# Patient Record
Sex: Female | Born: 1955 | Race: Black or African American | Hispanic: No | Marital: Single | Smoking: Never smoker
Health system: Southern US, Community
[De-identification: ages and names within clinical notes are randomized; demographics above are authoritative.]

## PROBLEM LIST (undated history)

## (undated) DIAGNOSIS — I2699 Other pulmonary embolism without acute cor pulmonale: Secondary | ICD-10-CM

## (undated) DIAGNOSIS — E785 Hyperlipidemia, unspecified: Secondary | ICD-10-CM

## (undated) DIAGNOSIS — M1990 Unspecified osteoarthritis, unspecified site: Secondary | ICD-10-CM

## (undated) DIAGNOSIS — G4730 Sleep apnea, unspecified: Secondary | ICD-10-CM

## (undated) DIAGNOSIS — I5189 Other ill-defined heart diseases: Secondary | ICD-10-CM

## (undated) DIAGNOSIS — I10 Essential (primary) hypertension: Secondary | ICD-10-CM

## (undated) DIAGNOSIS — K219 Gastro-esophageal reflux disease without esophagitis: Secondary | ICD-10-CM

## (undated) DIAGNOSIS — E119 Type 2 diabetes mellitus without complications: Secondary | ICD-10-CM

## (undated) HISTORY — DX: Other pulmonary embolism without acute cor pulmonale: I26.99

## (undated) HISTORY — DX: Sleep apnea, unspecified: G47.30

## (undated) HISTORY — PX: FOOT SURGERY: SHX648

## (undated) HISTORY — PX: BREAST EXCISIONAL BIOPSY: SUR124

---

## 1994-04-13 HISTORY — PX: CHOLECYSTECTOMY: SHX55

## 1997-04-13 DIAGNOSIS — I2699 Other pulmonary embolism without acute cor pulmonale: Secondary | ICD-10-CM

## 1997-04-13 HISTORY — DX: Other pulmonary embolism without acute cor pulmonale: I26.99

## 1997-04-13 HISTORY — PX: ABDOMINAL HYSTERECTOMY: SHX81

## 2001-06-30 ENCOUNTER — Emergency Department (HOSPITAL_COMMUNITY): Admission: EM | Admit: 2001-06-30 | Discharge: 2001-06-30 | Payer: Self-pay | Admitting: Emergency Medicine

## 2003-07-11 ENCOUNTER — Emergency Department (HOSPITAL_COMMUNITY): Admission: EM | Admit: 2003-07-11 | Discharge: 2003-07-12 | Payer: Self-pay | Admitting: *Deleted

## 2006-11-24 ENCOUNTER — Emergency Department (HOSPITAL_COMMUNITY): Admission: EM | Admit: 2006-11-24 | Discharge: 2006-11-24 | Payer: Self-pay | Admitting: Emergency Medicine

## 2008-01-31 ENCOUNTER — Emergency Department (HOSPITAL_COMMUNITY): Admission: EM | Admit: 2008-01-31 | Discharge: 2008-01-31 | Payer: Self-pay | Admitting: Emergency Medicine

## 2009-10-11 DIAGNOSIS — I5189 Other ill-defined heart diseases: Secondary | ICD-10-CM

## 2009-10-11 HISTORY — DX: Other ill-defined heart diseases: I51.89

## 2009-11-07 ENCOUNTER — Inpatient Hospital Stay (HOSPITAL_COMMUNITY): Admission: EM | Admit: 2009-11-07 | Discharge: 2009-11-08 | Payer: Self-pay | Admitting: Emergency Medicine

## 2009-11-07 ENCOUNTER — Encounter (INDEPENDENT_AMBULATORY_CARE_PROVIDER_SITE_OTHER): Payer: Self-pay | Admitting: Internal Medicine

## 2009-11-07 ENCOUNTER — Ambulatory Visit: Payer: Self-pay | Admitting: Cardiology

## 2009-11-13 ENCOUNTER — Ambulatory Visit: Payer: Self-pay | Admitting: Physician Assistant

## 2009-11-13 DIAGNOSIS — E782 Mixed hyperlipidemia: Secondary | ICD-10-CM

## 2009-11-13 DIAGNOSIS — M76899 Other specified enthesopathies of unspecified lower limb, excluding foot: Secondary | ICD-10-CM

## 2009-11-13 DIAGNOSIS — K219 Gastro-esophageal reflux disease without esophagitis: Secondary | ICD-10-CM

## 2009-11-13 DIAGNOSIS — M545 Low back pain, unspecified: Secondary | ICD-10-CM | POA: Insufficient documentation

## 2009-11-15 ENCOUNTER — Telehealth: Payer: Self-pay | Admitting: Family Medicine

## 2010-05-04 ENCOUNTER — Encounter: Payer: Self-pay | Admitting: Family Medicine

## 2010-05-13 NOTE — Progress Notes (Signed)
Summary: leg swollen  Phone Note Call from Patient   Summary of Call: patient left msg on machine stating that she was her on Wednesday and saw Dawn, but her leg is swollen more, and she has had a blood clott in the past and she is scared that it might be that again.  Please cal her at [phone].  Initial call taken by: Curtis Sites,  November 15, 2009 11:19 AM  Follow-up for Phone Call        NEEDS ED EVAL  Follow-up by: Syliva Overman MD,  November 15, 2009 11:56 AM  Additional Follow-up for Phone Call Additional follow up Details #1::        called and left message with family member to get her to call me back Additional Follow-up by: Everitt Amber LPN,  November 15, 2009 11:58 AM    Additional Follow-up for Phone Call Additional follow up Details #2::    called back and spoke to the same family member and they told me again that they would have her call back.
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
## 2010-06-28 LAB — RAPID URINE DRUG SCREEN, HOSP PERFORMED
Amphetamines: NOT DETECTED
Benzodiazepines: NOT DETECTED
Cocaine: NOT DETECTED

## 2010-06-28 LAB — HEMOGLOBIN A1C
Hgb A1c MFr Bld: 6.1 % — ABNORMAL HIGH (ref <5.7)
Mean Plasma Glucose: 128 mg/dL — ABNORMAL HIGH (ref <117)

## 2010-06-28 LAB — CARDIAC PANEL(CRET KIN+CKTOT+MB+TROPI)
CK, MB: 1.4 ng/mL (ref 0.3–4.0)
CK, MB: 1.4 ng/mL (ref 0.3–4.0)
Relative Index: 0.9 (ref 0.0–2.5)
Relative Index: 0.9 (ref 0.0–2.5)
Total CK: 158 U/L (ref 7–177)
Total CK: 164 U/L (ref 7–177)
Troponin I: 0.01 ng/mL (ref 0.00–0.06)
Troponin I: <0.01 ng/mL (ref 0.00–0.06)

## 2010-06-28 LAB — DIFFERENTIAL
Lymphocytes Relative: 34 % (ref 12–46)
Lymphs Abs: 3.2 10*3/uL (ref 0.7–4.0)
Lymphs Abs: 3.7 10*3/uL (ref 0.7–4.0)
Monocytes Relative: 5 % (ref 3–12)
Monocytes Relative: 5 % (ref 3–12)
Neutro Abs: 5.3 10*3/uL (ref 1.7–7.7)
Neutro Abs: 6.5 10*3/uL (ref 1.7–7.7)
Neutrophils Relative %: 58 % (ref 43–77)
Neutrophils Relative %: 60 % (ref 43–77)

## 2010-06-28 LAB — CBC
HCT: 40.6 % (ref 36.0–46.0)
Hemoglobin: 13.2 g/dL (ref 12.0–15.0)
MCH: 23.3 pg — ABNORMAL LOW (ref 26.0–34.0)
MCHC: 31.7 g/dL (ref 30.0–36.0)
MCV: 73.5 fL — ABNORMAL LOW (ref 78.0–100.0)
Platelets: 257 10*3/uL (ref 150–400)
RBC: 5.68 MIL/uL — ABNORMAL HIGH (ref 3.87–5.11)
RDW: 17.6 % — ABNORMAL HIGH (ref 11.5–15.5)
WBC: 10.9 10*3/uL — ABNORMAL HIGH (ref 4.0–10.5)
WBC: 9.1 10*3/uL (ref 4.0–10.5)

## 2010-06-28 LAB — BASIC METABOLIC PANEL
CO2: 27 mEq/L (ref 19–32)
Calcium: 8.9 mg/dL (ref 8.4–10.5)
Creatinine, Ser: 0.74 mg/dL (ref 0.4–1.2)
GFR calc Af Amer: >60 mL/min (ref >60)
GFR calc non Af Amer: >60 mL/min (ref >60)
Sodium: 137 mEq/L (ref 135–145)

## 2010-06-28 LAB — COMPREHENSIVE METABOLIC PANEL
Alkaline Phosphatase: 78 U/L (ref 39–117)
BUN: 9 mg/dL (ref 6–23)
Creatinine, Ser: 0.78 mg/dL (ref 0.4–1.2)
Glucose, Bld: 107 mg/dL — ABNORMAL HIGH (ref 70–99)
Potassium: 3.9 mEq/L (ref 3.5–5.1)
Total Protein: 7.7 g/dL (ref 6.0–8.3)

## 2010-06-28 LAB — LIPID PANEL
Cholesterol: 206 mg/dL — ABNORMAL HIGH (ref 0–200)
LDL Cholesterol: 145 mg/dL — ABNORMAL HIGH (ref 0–99)
Total CHOL/HDL Ratio: 5.2 RATIO
Triglycerides: 105 mg/dL (ref <150)
VLDL: 21 mg/dL (ref 0–40)

## 2010-06-28 LAB — POCT CARDIAC MARKERS
Myoglobin, poc: 79.2 ng/mL (ref 12–200)
Troponin i, poc: <0.05 ng/mL (ref 0.00–0.09)
Troponin i, poc: <0.05 ng/mL (ref 0.00–0.09)

## 2010-06-28 LAB — URINALYSIS, ROUTINE W REFLEX MICROSCOPIC
Bilirubin Urine: NEGATIVE
Glucose, UA: NEGATIVE mg/dL
Hgb urine dipstick: NEGATIVE
Specific Gravity, Urine: >1.03 — ABNORMAL HIGH (ref 1.005–1.030)

## 2010-06-28 LAB — D-DIMER, QUANTITATIVE: D-Dimer, Quant: 0.34 ug/mL-FEU (ref 0.00–0.48)

## 2010-06-28 LAB — TSH: TSH: 1.525 u[IU]/mL (ref 0.350–4.500)

## 2010-06-28 LAB — HEPATIC FUNCTION PANEL
ALT: 24 U/L (ref 0–35)
AST: 23 U/L (ref 0–37)
Albumin: 3.6 g/dL (ref 3.5–5.2)

## 2010-06-28 LAB — APTT: aPTT: 36 seconds (ref 24–37)

## 2010-12-06 ENCOUNTER — Emergency Department (HOSPITAL_COMMUNITY): Payer: Self-pay

## 2010-12-06 ENCOUNTER — Encounter: Payer: Self-pay | Admitting: Emergency Medicine

## 2010-12-06 ENCOUNTER — Emergency Department (HOSPITAL_COMMUNITY)
Admission: EM | Admit: 2010-12-06 | Discharge: 2010-12-06 | Disposition: A | Discharge status: Home / Self Care | Payer: Self-pay | Attending: Emergency Medicine | Admitting: Emergency Medicine

## 2010-12-06 DIAGNOSIS — M79609 Pain in unspecified limb: Secondary | ICD-10-CM | POA: Insufficient documentation

## 2010-12-06 DIAGNOSIS — Z86718 Personal history of other venous thrombosis and embolism: Secondary | ICD-10-CM | POA: Insufficient documentation

## 2010-12-06 DIAGNOSIS — M791 Myalgia, unspecified site: Secondary | ICD-10-CM

## 2010-12-06 DIAGNOSIS — IMO0001 Reserved for inherently not codable concepts without codable children: Secondary | ICD-10-CM | POA: Insufficient documentation

## 2010-12-06 DIAGNOSIS — R0602 Shortness of breath: Secondary | ICD-10-CM | POA: Insufficient documentation

## 2010-12-06 LAB — BASIC METABOLIC PANEL
CO2: 26 mEq/L (ref 19–32)
Calcium: 8.9 mg/dL (ref 8.4–10.5)
Creatinine, Ser: 0.67 mg/dL (ref 0.50–1.10)
Glucose, Bld: 124 mg/dL — ABNORMAL HIGH (ref 70–99)

## 2010-12-06 LAB — CBC
HCT: 38.5 % (ref 36.0–46.0)
Hemoglobin: 12.4 g/dL (ref 12.0–15.0)
MCH: 22.8 pg — ABNORMAL LOW (ref 26.0–34.0)
MCV: 70.6 fL — ABNORMAL LOW (ref 78.0–100.0)
RBC: 5.45 MIL/uL — ABNORMAL HIGH (ref 3.87–5.11)

## 2010-12-06 LAB — CK: Total CK: 305 U/L — ABNORMAL HIGH (ref 7–177)

## 2010-12-06 MED ORDER — ONDANSETRON HCL 4 MG/2ML IJ SOLN
4.0000 mg | Freq: Once | INTRAMUSCULAR | Status: AC
  Administered 2010-12-06: 4 mg via INTRAVENOUS
  Filled 2010-12-06: qty 2

## 2010-12-06 MED ORDER — MORPHINE SULFATE 4 MG/ML IJ SOLN
4.0000 mg | Freq: Once | INTRAMUSCULAR | Status: AC
  Administered 2010-12-06: 4 mg via INTRAVENOUS
  Filled 2010-12-06: qty 1

## 2010-12-06 MED ORDER — OXYCODONE-ACETAMINOPHEN 5-325 MG PO TABS: 1.0000 | ORAL_TABLET | ORAL | Status: AC | PRN

## 2010-12-06 MED ORDER — HYDROMORPHONE HCL 1 MG/ML IJ SOLN
1.0000 mg | Freq: Once | INTRAMUSCULAR | Status: AC
  Administered 2010-12-06: 1 mg via INTRAVENOUS
  Filled 2010-12-06: qty 1

## 2010-12-06 NOTE — ED Provider Notes (Signed)
History     CSN: [id number] Arrival date & time: 12/06/2010  1:20 AM  Chief Complaint  Patient presents with  . Shortness of Breath   HPI Comments: Pt reports left thigh and left calf pain, no injury, she has had this pain on/off for awhile.  No known cause.  No new back pain, does not radiate from back.  No leg weakness Reports distant h/o PE, but no recent DVT. No new edema/redness to left LE.  She had mentioned SOB to nurse, but admits to me this is chronic and not new and not changed.  Patient is a 55 y.o. female presenting with leg pain.  Leg Pain  There was no injury mechanism. The pain is present in the left thigh (left calf). The quality of the pain is described as aching. The pain is moderate. The pain has been constant since onset. Associated symptoms include inability to bear weight. Pertinent negatives include no numbness, no loss of motion, no muscle weakness, no loss of sensation and no tingling. The symptoms are aggravated by activity and bearing weight.    History reviewed. No pertinent past medical history.  History reviewed. No pertinent past surgical history.  No family history on file.  History  Substance Use Topics  . Smoking status: Never Smoker   . Smokeless tobacco: Not on file  . Alcohol Use: No    OB History    Grav Para Term Preterm Abortions TAB SAB Ect Mult Living                  Review of Systems  Neurological: Negative for tingling and numbness.  All other systems reviewed and are negative.    Physical Exam  BP 117/68  Pulse 91  Temp(Src) 98 F (36.7 C) (Oral)  Resp 24  Ht 5\' 9"  (1.753 m)  Wt 360 lb (163.295 kg)  BMI 53.16 kg/m2  SpO2 97%  Physical Exam  CONSTITUTIONAL: Well developed/well nourished HEAD AND FACE: Normocephalic/atraumatic EYES: EOMI/PERRL ENMT: Mucous membranes moist NECK: supple no meningeal signs SPINE:entire spine nontender CV: S1/S2 noted, no murmurs/rubs/gallops noted LUNGS: Lungs are clear to  auscultation bilaterally, no apparent distress ABDOMEN: soft, nontender, no rebound or guarding, obese NEURO: Pt is awake/alert, moves all extremitiesx4, no focal motor deficits noted in the LE EXTREMITIES: pulses normal, full ROM, but tender to left thigh and left calf, no erythema noted, no crepitance noted, symmetric nonpitting edema in each LE.   SKIN: warm, color normal PSYCH: no abnormalities of mood noted   ED Course  Procedures  MDM Nursing notes reviewed and considered in documentation All labs/vitals reviewed and considered Previous records reviewed and considered xrays reviewed and considered  Pt low risk for DVT, and negative D-dimer noted   4:31 AM Pt reports continued pain.  No known injury but she admits she will get this pain on/off for awhile.  Again she denies any new CP/SOB. Will get imaging as she now localizes pain to left thigh/left tibial surface She can range left hip/knee/ankle.  Denies new back pain or pain radiating into her LE.  No signs of cellulitis.    Pt much improved at time of d/c.  She is amenable to taking a day off work, rest at home, and walker if needed.  She admits to having this pain on/off in the past, suspicion for acute infectious/neurovascular process is low  Joya Gaskins, MD 12/06/10 [phone]

## 2010-12-06 NOTE — ED Notes (Signed)
Patient c/o shortness of breath x 2 weeks; states has gotten worse.

## 2011-01-12 LAB — CBC
HCT: 44
Hemoglobin: 13.9
MCHC: 31.5
RDW: 16.6 — ABNORMAL HIGH

## 2011-01-12 LAB — COMPREHENSIVE METABOLIC PANEL
Alkaline Phosphatase: 72
BUN: 9
GFR calc non Af Amer: >60
Glucose, Bld: 93
Potassium: 3.7
Total Bilirubin: 1
Total Protein: 7.9

## 2011-01-12 LAB — DIFFERENTIAL
Basophils Absolute: 0.1
Basophils Relative: 1
Monocytes Relative: 5
Neutro Abs: 5.5
Neutrophils Relative %: 61

## 2011-01-12 LAB — POCT CARDIAC MARKERS
CKMB, poc: 2.1
Myoglobin, poc: 159

## 2011-01-12 LAB — D-DIMER, QUANTITATIVE: D-Dimer, Quant: 0.4

## 2012-04-14 ENCOUNTER — Encounter (HOSPITAL_COMMUNITY): Payer: Self-pay | Admitting: *Deleted

## 2012-04-14 ENCOUNTER — Emergency Department (HOSPITAL_COMMUNITY): Payer: BC Managed Care – PPO

## 2012-04-14 ENCOUNTER — Observation Stay (HOSPITAL_COMMUNITY): Payer: BC Managed Care – PPO

## 2012-04-14 ENCOUNTER — Observation Stay (HOSPITAL_COMMUNITY)
Admission: EM | Admit: 2012-04-14 | Discharge: 2012-04-15 | Disposition: A | Discharge status: Home / Self Care | Payer: BC Managed Care – PPO | Attending: Internal Medicine | Admitting: Internal Medicine

## 2012-04-14 DIAGNOSIS — E782 Mixed hyperlipidemia: Secondary | ICD-10-CM | POA: Diagnosis present

## 2012-04-14 DIAGNOSIS — R55 Syncope and collapse: Secondary | ICD-10-CM

## 2012-04-14 DIAGNOSIS — R030 Elevated blood-pressure reading, without diagnosis of hypertension: Secondary | ICD-10-CM

## 2012-04-14 DIAGNOSIS — I10 Essential (primary) hypertension: Secondary | ICD-10-CM | POA: Insufficient documentation

## 2012-04-14 DIAGNOSIS — R7303 Prediabetes: Secondary | ICD-10-CM

## 2012-04-14 DIAGNOSIS — R0602 Shortness of breath: Secondary | ICD-10-CM | POA: Insufficient documentation

## 2012-04-14 DIAGNOSIS — I5189 Other ill-defined heart diseases: Secondary | ICD-10-CM

## 2012-04-14 DIAGNOSIS — R079 Chest pain, unspecified: Secondary | ICD-10-CM | POA: Insufficient documentation

## 2012-04-14 DIAGNOSIS — R739 Hyperglycemia, unspecified: Secondary | ICD-10-CM | POA: Diagnosis present

## 2012-04-14 DIAGNOSIS — E1169 Type 2 diabetes mellitus with other specified complication: Secondary | ICD-10-CM | POA: Diagnosis present

## 2012-04-14 DIAGNOSIS — M545 Low back pain: Secondary | ICD-10-CM

## 2012-04-14 DIAGNOSIS — K219 Gastro-esophageal reflux disease without esophagitis: Secondary | ICD-10-CM | POA: Insufficient documentation

## 2012-04-14 DIAGNOSIS — R000 Tachycardia, unspecified: Secondary | ICD-10-CM | POA: Diagnosis present

## 2012-04-14 DIAGNOSIS — E785 Hyperlipidemia, unspecified: Secondary | ICD-10-CM | POA: Insufficient documentation

## 2012-04-14 DIAGNOSIS — R7309 Other abnormal glucose: Secondary | ICD-10-CM | POA: Insufficient documentation

## 2012-04-14 DIAGNOSIS — M76899 Other specified enthesopathies of unspecified lower limb, excluding foot: Secondary | ICD-10-CM

## 2012-04-14 HISTORY — DX: Unspecified osteoarthritis, unspecified site: M19.90

## 2012-04-14 HISTORY — DX: Gastro-esophageal reflux disease without esophagitis: K21.9

## 2012-04-14 HISTORY — DX: Other pulmonary embolism without acute cor pulmonale: I26.99

## 2012-04-14 HISTORY — DX: Hyperlipidemia, unspecified: E78.5

## 2012-04-14 HISTORY — DX: Morbid (severe) obesity due to excess calories: E66.01

## 2012-04-14 HISTORY — DX: Essential (primary) hypertension: I10

## 2012-04-14 HISTORY — DX: Other ill-defined heart diseases: I51.89

## 2012-04-14 LAB — BASIC METABOLIC PANEL
BUN: 14 mg/dL (ref 6–23)
CO2: 23 mEq/L (ref 19–32)
Calcium: 9 mg/dL (ref 8.4–10.5)
Chloride: 101 mEq/L (ref 96–112)
Creatinine, Ser: 0.85 mg/dL (ref 0.50–1.10)
GFR calc Af Amer: 88 mL/min — ABNORMAL LOW (ref >90)
GFR calc non Af Amer: 76 mL/min — ABNORMAL LOW (ref >90)
Glucose, Bld: 145 mg/dL — ABNORMAL HIGH (ref 70–99)
Potassium: 3.6 mEq/L (ref 3.5–5.1)
Sodium: 138 mEq/L (ref 135–145)

## 2012-04-14 LAB — HEMOGLOBIN A1C
Hgb A1c MFr Bld: 6.4 % — ABNORMAL HIGH (ref <5.7)
Mean Plasma Glucose: 137 mg/dL — ABNORMAL HIGH (ref <117)

## 2012-04-14 LAB — CBC
HCT: 40.7 % (ref 36.0–46.0)
Hemoglobin: 13.1 g/dL (ref 12.0–15.0)
MCH: 22.9 pg — ABNORMAL LOW (ref 26.0–34.0)
MCHC: 32.2 g/dL (ref 30.0–36.0)
MCV: 71 fL — ABNORMAL LOW (ref 78.0–100.0)
Platelets: 271 10*3/uL (ref 150–400)
RBC: 5.73 MIL/uL — ABNORMAL HIGH (ref 3.87–5.11)
RDW: 16 % — ABNORMAL HIGH (ref 11.5–15.5)
WBC: 10.7 10*3/uL — ABNORMAL HIGH (ref 4.0–10.5)

## 2012-04-14 LAB — HEPATIC FUNCTION PANEL
ALT: 20 U/L (ref 0–35)
AST: 19 U/L (ref 0–37)
Total Protein: 8.1 g/dL (ref 6.0–8.3)

## 2012-04-14 LAB — TSH: TSH: 0.519 u[IU]/mL (ref 0.350–4.500)

## 2012-04-14 LAB — TROPONIN I: Troponin I: <0.3 ng/mL (ref <0.30)

## 2012-04-14 LAB — LIPASE, BLOOD: Lipase: 32 U/L (ref 11–59)

## 2012-04-14 MED ORDER — ACETAMINOPHEN 650 MG RE SUPP: 650.0000 mg | Freq: Four times a day (QID) | RECTAL | Status: DC | PRN

## 2012-04-14 MED ORDER — DOCUSATE SODIUM 100 MG PO CAPS
100.0000 mg | ORAL_CAPSULE | Freq: Two times a day (BID) | ORAL | Status: DC
  Administered 2012-04-14 – 2012-04-15 (×2): 100 mg via ORAL
  Filled 2012-04-14 (×3): qty 1

## 2012-04-14 MED ORDER — IOHEXOL 350 MG/ML SOLN
120.0000 mL | Freq: Once | INTRAVENOUS | Status: AC | PRN
  Administered 2012-04-14: 120 mL via INTRAVENOUS

## 2012-04-14 MED ORDER — ONDANSETRON HCL 4 MG/2ML IJ SOLN: 4.0000 mg | Freq: Four times a day (QID) | INTRAMUSCULAR | Status: DC | PRN

## 2012-04-14 MED ORDER — ALBUTEROL SULFATE (5 MG/ML) 0.5% IN NEBU: 2.5000 mg | INHALATION_SOLUTION | RESPIRATORY_TRACT | Status: DC | PRN

## 2012-04-14 MED ORDER — ASPIRIN EC 81 MG PO TBEC
81.0000 mg | DELAYED_RELEASE_TABLET | Freq: Every day | ORAL | Status: DC
  Administered 2012-04-15: 81 mg via ORAL
  Filled 2012-04-14: qty 1

## 2012-04-14 MED ORDER — OXYCODONE HCL 5 MG PO TABS: 5.0000 mg | ORAL_TABLET | ORAL | Status: DC | PRN

## 2012-04-14 MED ORDER — POTASSIUM CHLORIDE IN NACL 20-0.9 MEQ/L-% IV SOLN
INTRAVENOUS | Status: DC
  Administered 2012-04-14: 18:00:00 via INTRAVENOUS

## 2012-04-14 MED ORDER — ALUM & MAG HYDROXIDE-SIMETH 200-200-20 MG/5ML PO SUSP: 30.0000 mL | Freq: Four times a day (QID) | ORAL | Status: DC | PRN

## 2012-04-14 MED ORDER — METOPROLOL TARTRATE 25 MG PO TABS
12.5000 mg | ORAL_TABLET | Freq: Two times a day (BID) | ORAL | Status: DC
  Administered 2012-04-14: 12.5 mg via ORAL
  Filled 2012-04-14: qty 1

## 2012-04-14 MED ORDER — NITROGLYCERIN 0.4 MG SL SUBL: 0.4000 mg | SUBLINGUAL_TABLET | SUBLINGUAL | Status: DC | PRN

## 2012-04-14 MED ORDER — MORPHINE SULFATE 2 MG/ML IJ SOLN: 2.0000 mg | INTRAMUSCULAR | Status: DC | PRN

## 2012-04-14 MED ORDER — ONDANSETRON HCL 4 MG PO TABS: 4.0000 mg | ORAL_TABLET | Freq: Four times a day (QID) | ORAL | Status: DC | PRN

## 2012-04-14 MED ORDER — ASPIRIN 81 MG PO CHEW
324.0000 mg | CHEWABLE_TABLET | Freq: Once | ORAL | Status: AC
  Administered 2012-04-14: 324 mg via ORAL
  Filled 2012-04-14: qty 4

## 2012-04-14 MED ORDER — ACETAMINOPHEN 325 MG PO TABS: 650.0000 mg | ORAL_TABLET | Freq: Four times a day (QID) | ORAL | Status: DC | PRN

## 2012-04-14 MED ORDER — PANTOPRAZOLE SODIUM 40 MG PO TBEC
40.0000 mg | DELAYED_RELEASE_TABLET | Freq: Every day | ORAL | Status: DC
  Administered 2012-04-15: 40 mg via ORAL
  Filled 2012-04-14: qty 1

## 2012-04-14 MED ORDER — ENOXAPARIN SODIUM 40 MG/0.4ML ~~LOC~~ SOLN: 40.0000 mg | SUBCUTANEOUS | Status: DC

## 2012-04-14 NOTE — H&P (Signed)
[facility] History and Physical  LASHUNDA GREIS ZOX:[id number] DOB: 06/12/1955 DOA: 04/14/2012  Referring physician: ED physician, Dr. Juleen China PCP: No primary provider on file.  Specialists: None  Chief Complaint: Chest pain and loss of consciousness.  HPI: Tracey [name] is a 57 y.o. female history significant for a pulmonary embolism, diastolic dysfunction, and morbid obesity, who presents to the emergency department today with a chief complaint of chest pain and a loss of consciousness. The patient has had chest pain on and off for several days. It is located primarily in the substernal area. At its worse, it is a 10 over 10 in intensity. The pain worsens with activity and then is relieved with rest. There is some radiation to the back, but no radiation to the jaw or left arm. At times, she has associated diaphoresis. She has shortness of breath with activity. Shortness of breath occurs with chest pain and without chest pain, but primarily with activity. She denies associated nausea or vomiting. This morning, she got up in her usual state of health. [place] she had to walk up a flight of steps. On her way up the steps, she developed chest pain, shortness of breath, and dizziness. When she got to the top, she felt lightheaded, dizzy, and more short of breath. She leaned over and then the next thing she knew she was on the floor. Individuals were around her as she apparently lost consciousness. When she came to, she was aware of her surroundings. She fell on her right side, but she denies right-sided pain. There was no head trauma. She has had intermittent dizziness when standing over the past week or two. She denies upper respiratory infection symptoms, fever, chills, and diarrhea.  In the emergency department, she is hypertensive with a blood pressure 169/105. Otherwise, she is afebrile and is oxygenating in the upper 90s on room air. CT angiogram of her chest reveals no evidence  of pulmonary embolism. CT of her head reveals no acute intracranial findings. Her EKG reveals sinus tachycardia with a heart rate of 113 beats per minute. Her lab data are significant for a normal troponin I., glucose of 145, and WBC of 10.7. She is being admitted for further evaluation and management.   Review of Systems: Her review of systems is positive as above in history present illness, and for chronic low back pain, chronic pain in both legs, occasional numbness in both legs, dizziness when she stands. Otherwise review of systems is negative.   Past Medical History  Diagnosis Date  . PE (pulmonary embolism)   . Diastolic dysfunction 10/2009    Grade 1  . Morbid obesity   . Hyperlipidemia   . Pulmonary embolism   . GERD (gastroesophageal reflux disease)   . Hyperlipidemia    Past Surgical History  Procedure Date  . Cesarean section   . Abdominal hysterectomy    Social History: the patient is divorced. She lives in [place]. She has 3 children. She is employed part-time. She denies tobacco, alcohol, and illicit drug use.    Allergies  Allergen Reactions  . Haloperidol Lactate Anaphylaxis   Family history: The patient is not sure what her mother died of, but she had a history of diabetes. Her father died of cirrhosis of the liver.   Prior to Admission medications   Not on File  Medications: She takes ibuprofen 3-4 tablets daily (20 mg).   Physical Exam: Filed Vitals:   04/14/12 1159 04/14/12 1230 04/14/12 1234 04/14/12  1237  BP: 124/77 120/79 139/93 169/105  Pulse: 94 99 103 117  Temp:      TempSrc:      Resp: 18     Height:      Weight:      SpO2: 96%        General:  Alert morbidly obese 57 year old African-American woman sitting up in bed, in no acute distress.   Eyes: Pupils are equal, round, and reactive to light. Extraocular was are intact. Conjunctivae are clear. Sclerae are white.   ENT: Oropharynx reveals mildly dry mucous membranes. No exudates or  erythema.   Neck: Supple, no adenopathy, no thyromegaly, no bruit.   Cardiovascular: Distant S1, S2, with a soft systolic murmur.   Respiratory: Clear to auscultation bilaterally. Breathing is nonlabored.   Abdomen: Obese, positive bowel sounds, soft, nontender, nondistended.   Skin: Fair turgor.   Musculoskeletal: No acute hot red joints. Pedal pulses palpable. Trace pedal edema.   Psychiatric: Flat/sad affect. She is alert and oriented x3. Her speech is clear.   Neurologic: Cranial nerves II through XII are intact. Strength in the supine position is globally 5 minus over 5 and symmetric. Sensation is grossly intact. Gait not assessed.   Labs on Admission:  Basic Metabolic Panel:  Lab 04/14/12 9811  NA 138  K 3.6  CL 101  CO2 23  GLUCOSE 145*  BUN 14  CREATININE 0.85  CALCIUM 9.0  MG --  PHOS --   Liver Function Tests: No results found for this basename: AST:5,ALT:5,ALKPHOS:5,BILITOT:5,PROT:5,ALBUMIN:5 in the last 168 hours No results found for this basename: LIPASE:5,AMYLASE:5 in the last 168 hours No results found for this basename: AMMONIA:5 in the last 168 hours CBC:  Lab 04/14/12 0952  WBC 10.7*  NEUTROABS --  HGB 13.1  HCT 40.7  MCV 71.0*  PLT 271   Cardiac Enzymes:  Lab 04/14/12 0952  CKTOTAL --  CKMB --  CKMBINDEX --  TROPONINI <0.30    BNP (last 3 results) No results found for this basename: PROBNP:3 in the last 8760 hours CBG: No results found for this basename: GLUCAP:5 in the last 168 hours  Radiological Exams on Admission: Dg Chest 2 View  04/14/2012  *RADIOLOGY REPORT*  Clinical Data: Chest pain and shortness of breath.  CHEST - 2 VIEW  Comparison: 11/07/2009.  Findings: The cardiac silhouette remains borderline enlarged.  The pulmonary vasculature and interstitial markings are mildly prominent with no pleural fluid seen.  Thoracic spine degenerative changes.  IMPRESSION:  1.  Borderline cardiomegaly and mild pulmonary vascular congestion.  2.  Mild chronic interstitial lung disease with possible minimal superimposed interstitial pulmonary edema.   Original Report Authenticated By: Beckie Salts, M.D.    Ct Angio Chest W/cm &/or Wo Cm  04/14/2012  *RADIOLOGY REPORT*  Clinical Data: Chest pain, shortness of breath, tachycardia  CT ANGIOGRAPHY CHEST  Technique:  Multidetector CT imaging of the chest using the standard protocol during bolus administration of intravenous contrast. Multiplanar reconstructed images including MIPs were obtained and reviewed to evaluate the vascular anatomy.  Contrast: OMNIPAQUE IOHEXOL 350 MG/ML SOLN  Comparison: Chest radiographs dated 04/14/2012  Findings: Suboptimal contrast opacification secondary to body habitus/bolus timing.  No evidence of pulmonary embolism.  Lungs are essentially clear.  No suspicious pulmonary nodules.  No pleural effusion or pneumothorax.  Visualized thyroid is unremarkable.  The heart is normal in size. No pericardial effusion.  No suspicious mediastinal, hilar, or axillary lymphadenopathy.  Visualized upper abdomen is notable for  moderate hepatic steatosis.  Degenerative changes of the visualized thoracolumbar spine.  IMPRESSION: No evidence of pulmonary embolism.  No evidence of acute cardiopulmonary disease.   Original Report Authenticated By: Charline Bills, M.D.     EKG: As above in history of present illness   Assessment/Plan Principal Problem:  *Syncope and collapse Active Problems:  Chest pain  HYPERLIPIDEMIA  Morbid obesity  GERD  Hyperglycemia  Tachycardia  Diastolic dysfunction  Elevated blood pressure   1. This is a 57 year old morbidly obese woman who presents with chest pain and syncope. She was hospitalized in 2011 for chest pain and ruled out for myocardial infarction at that time. Her 2-D echocardiogram revealed grade 1 diastolic dysfunction. She has a history of pulmonary embolism, but a CT angiogram of her chest reveals no PE. Her symptoms are  concerning for coronary artery disease. However, her EKG reveals no significant ST or T wave changes and her first troponin I is within normal limits. She has risk factors including her history of hyperlipidemia, query hypertension, and morbid obesity. She may warrant a cardiac stress test for further evaluation. Another 2-D echocardiogram will be ordered along with other studies.     Plan: 1. The patient received an aspirin in the emergency department. We'll continue aspirin 81 mg daily empirically. 2. Discontinue ibuprofen. Treat her pain with as needed opiates. 3. Sublingual nitroglycerin as needed for chest pain. 4. Start PPI empirically. 5. Start metoprolol at 12.5 mg twice a day for blood pressure control. We'll monitor her blood pressure over the next 24 hours for adjustments. 6. We'll consult [place] [facility]. 7. For further evaluation, we'll order cardiac enzymes, TSH, hemoglobin A1c, and 2-D echocardiogram. We'll order PT/INR. We'll order a lipase and hepatic function panel. We'll order a followup EKG in the morning.   Code Status: Full code  Family Communication: No family available  Disposition Plan: Anticipate discharge to home in the next 24-48 hours.   Time spent: One hour.   [facility] Pager [phone]   If 7PM-7AM, please contact night-coverage [email or web address] Password [email or web address] 04/14/2012, 1:13 PM

## 2012-04-14 NOTE — Progress Notes (Signed)
*  PRELIMINARY RESULTS* Echocardiogram 2D Echocardiogram has been performed.  Tracey [name] 04/14/2012, 4:27 PM

## 2012-04-14 NOTE — ED Notes (Addendum)
Pt c/o mid center chest pain non radiating that started last night, dizziness and sob this am while walking, pt states that she "passed out" for a few seconds [place], pt arrives to er alert, able to answer questions, denies any chest pain at present, admits to still having sob, denies any n/v, when talking to pt she states that she has been having chest pains, sob with exertion for over a year and has not seen dr. Prior to today,

## 2012-04-14 NOTE — ED Provider Notes (Signed)
History    This chart was scribed for Raeford Razor, MD, MD by Smitty Pluck, ED Scribe. The patient was seen in room APA10/APA10 and the patient's care was started at 10:22 AM.   CSN: [id number]  Arrival date & time 04/14/12  0936      Chief Complaint  Patient presents with  . Chest Pain  . Loss of Consciousness    (Consider location/radiation/quality/duration/timing/severity/associated sxs/prior treatment) Patient is a 57 y.o. female presenting with syncope. The history is provided by the patient. No language interpreter was used.  Loss of Consciousness This is a new problem. The current episode started 1 to 2 hours ago. The problem occurs rarely. The problem has been gradually improving. Associated symptoms include chest pain and shortness of breath. Nothing aggravates the symptoms. Nothing relieves the symptoms. She has tried nothing for the symptoms.   Tracey [name] is a 57 y.o. female with hx of PE who presents to the Emergency Department complaining of syncope onset today onset 2 hours ago. Pt reports that she was walking and suddenly became moderately dizzy (spinning), had diaphoresis, SOB and chest pain before LOC. She states the episodes of syncope lasted for only a couple of seconds. She states that she has intermittent chest pain that has been occuring for 1 year. She states that episodes of chest pain last for a couple of seconds. She reports bending over aggravates the chse She reports that she felt normal today when she awoke. She denies hx of LOC, hx of seizures, head injury, bleeding and any other symptoms.   Past Medical History  Diagnosis Date  . PE (pulmonary embolism)   . Enlarged heart     Past Surgical History  Procedure Date  . Cesarean section   . Abdominal hysterectomy     No family history on file.  History  Substance Use Topics  . Smoking status: Never Smoker   . Smokeless tobacco: Not on file  . Alcohol Use: No    OB History    Grav Para  Term Preterm Abortions TAB SAB Ect Mult Living                  Review of Systems  Respiratory: Positive for shortness of breath.   Cardiovascular: Positive for chest pain and syncope.  All other systems reviewed and are negative.    Allergies  Haloperidol lactate  Home Medications  No current outpatient prescriptions on file.  BP 145/83  Pulse 108  Temp 98.1 F (36.7 C) (Oral)  Resp 18  Ht 5\' 10"  (1.778 m)  Wt 390 lb (176.903 kg)  BMI 55.96 kg/m2  SpO2 96%  Physical Exam  Nursing note and vitals reviewed. Constitutional: She appears well-developed and well-nourished. No distress.  HENT:  Head: Normocephalic and atraumatic.  Eyes: Conjunctivae normal are normal. Right eye exhibits no discharge. Left eye exhibits no discharge.  Neck: Neck supple.  Cardiovascular: Normal rate, regular rhythm and normal heart sounds.  Exam reveals no gallop and no friction rub.   No murmur heard. Pulmonary/Chest: Effort normal. No respiratory distress.       Breath sounds diminished bilaterally   Abdominal: Soft. She exhibits no distension. There is no tenderness.       Obese   Musculoskeletal: She exhibits no edema and no tenderness.  Neurological: She is alert.  Skin: Skin is warm and dry.  Psychiatric: She has a normal mood and affect. Her behavior is normal. Thought content normal.    ED Course  Procedures (including critical care time) DIAGNOSTIC STUDIES: Oxygen Saturation is 96% on room air, normal by my interpretation.    COORDINATION OF CARE: 10:26 AM Discussed ED treatment with pt  10:42 AM Ordered:  Medications  aspirin chewable tablet 324 mg (324 mg Oral Given 04/14/12 0958)       Labs Reviewed  CBC - Abnormal; Notable for the following:    WBC 10.7 (*)     RBC 5.73 (*)     MCV 71.0 (*)     MCH 22.9 (*)     RDW 16.0 (*)     All other components within normal limits  BASIC METABOLIC PANEL - Abnormal; Notable for the following:    Glucose, Bld 145 (*)      GFR calc non Af Amer 76 (*)     GFR calc Af Amer 88 (*)     All other components within normal limits  TROPONIN I   Dg Chest 2 View  04/14/2012  *RADIOLOGY REPORT*  Clinical Data: Chest pain and shortness of breath.  CHEST - 2 VIEW  Comparison: 11/07/2009.  Findings: The cardiac silhouette remains borderline enlarged.  The pulmonary vasculature and interstitial markings are mildly prominent with no pleural fluid seen.  Thoracic spine degenerative changes.  IMPRESSION:  1.  Borderline cardiomegaly and mild pulmonary vascular congestion. 2.  Mild chronic interstitial lung disease with possible minimal superimposed interstitial pulmonary edema.   Original Report Authenticated By: Beckie Salts, M.D.    EKG:  Rhythm: sinus tachycardia Rate: 113 Axis: normal Intervals: normal ST segments: NS ST changes Comparison: Little change from previous from 11/08/09    1. Syncope       MDM  56yF with syncope and CP. ECHO from 2011 with mild diastolic dysfunction, normal EF and no wall motion abnormalities. Hx of PE and given CP, dyspnea, and tachycardia a CT angio was done. Subsequently negative. EKG appears relatively stable from previous. Trop normal.        I personally preformed the services scribed in my presence. The recorded information has been reviewed is accurate. Raeford Razor, MD.    Raeford Razor, MD 04/14/12 [phone]

## 2012-04-14 NOTE — Consult Note (Signed)
Patient ID: Tracey [name] MRN: [id number], DOB/AGE: 55-Feb-1957   Admit date: 04/14/2012 Date of Consult: @TODAY @  Primary Physician: No primary provider on file. Primary Cardiologist: New   Problem List: Past Medical History  Diagnosis Date  . PE (pulmonary embolism)   . Diastolic dysfunction 10/2009    Grade 1  . Morbid obesity   . Hyperlipidemia   . Pulmonary embolism   . GERD (gastroesophageal reflux disease)   . Hyperlipidemia     Past Surgical History  Procedure Date  . Cesarean section   . Abdominal hysterectomy      Allergies:  Allergies  Allergen Reactions  . Haloperidol Lactate Anaphylaxis    HPI:  Patient is a a 57 yo with a history of PE, diastolic dysfunction, obesity   Presented to [facility] ER today with CP and syncope. Pt reported CP today  Today was the longest  Lasted 5 min.  Longest it has lasted.  May be worse with exerrtion.  Last .  Worse with exertion. Last episode was 1 month ago.   Some diaphroresis and SOB  Has DOE.   Today, walking up steps developed CP and SOB  When walking down hall bent down with symptoms.  Things went dark and she passed out.  THis is the first syncopal spell she has had. In ER BP was 169/105  CT was negative for PE   Patient started on 81 mg ASA, metoprolol.   Echo ordered. On talking to the patient she is not that active.  Limited by back and legs pain.   Inpatient Medications:    . aspirin EC  81 mg Oral Daily  . docusate sodium  100 mg Oral BID  . enoxaparin (LOVENOX) injection  40 mg Subcutaneous Q24H  . metoprolol tartrate  12.5 mg Oral BID  . pantoprazole  40 mg Oral Daily    No family history on file.   History   Social History  . Marital Status: Single    Spouse Name: N/A    Number of Children: N/A  . Years of Education: N/A   Occupational History  . Not on file.   Social History Main Topics  . Smoking status: Never Smoker   . Smokeless tobacco: Not on file  . Alcohol Use: No  . Drug Use: No  .  Sexually Active:    Other Topics Concern  . Not on file   Social History Narrative  . No narrative on file     Review of Systems: All other systems reviewed and are otherwise negative except as noted above.  Physical Exam: Filed Vitals:   04/14/12 1342  BP: 159/95  Pulse: 100  Temp: 98.2 F (36.8 C)  Resp: 20   No intake or output data in the 24 hours ending 04/14/12 1442  General:  Obese 57 yo in NAD Head: Normocephalic, atraumatic, sclera non-icteric Neck: Negative for carotid bruits. JVP not elevated. Lungs: Clear bilaterally to auscultation without wheezes, rales, or rhonchi. Breathing is unlabored. Heart: RRR with S1 S2. No murmurs, rubs, or gallops appreciated. Abdomen: Soft, non-tender, non-distended with normoactive bowel sounds. No hepatomegaly. No rebound/guarding. No obvious abdominal masses. Msk:  Strength and tone appears normal for age. Extremities: No clubbing, cyanosis or edema.  Distal pedal pulses are 2+ and equal bilaterally. Neuro: Alert and oriented X 3. Moves all extremities spontaneously. Psych:  Responds to questions appropriately with a normal affect.  Labs: Results for orders placed during the hospital encounter of 04/14/12 (from the  past 24 hour(s))  CBC     Status: Abnormal   Collection Time   04/14/12  9:52 AM      Component Value Range   WBC 10.7 (*) 4.0 - 10.5 K/uL   RBC 5.73 (*) 3.87 - 5.11 MIL/uL   Hemoglobin 13.1  12.0 - 15.0 g/dL   HCT 16.1  09.6 - 04.5 %   MCV 71.0 (*) 78.0 - 100.0 fL   MCH 22.9 (*) 26.0 - 34.0 pg   MCHC 32.2  30.0 - 36.0 g/dL   RDW 40.9 (*) 81.1 - 91.4 %   Platelets 271  150 - 400 K/uL  TROPONIN I     Status: Normal   Collection Time   04/14/12  9:52 AM      Component Value Range   Troponin I <0.30  <0.30 ng/mL  BASIC METABOLIC PANEL     Status: Abnormal   Collection Time   04/14/12  9:52 AM      Component Value Range   Sodium 138  135 - 145 mEq/L   Potassium 3.6  3.5 - 5.1 mEq/L   Chloride 101  96 - 112 mEq/L    CO2 23  19 - 32 mEq/L   Glucose, Bld 145 (*) 70 - 99 mg/dL   BUN 14  6 - 23 mg/dL   Creatinine, Ser 7.82  0.50 - 1.10 mg/dL   Calcium 9.0  8.4 - 95.6 mg/dL   GFR calc non Af Amer 76 (*) >90 mL/min   GFR calc Af Amer 88 (*) >90 mL/min  TROPONIN I     Status: Normal   Collection Time   04/14/12  1:44 PM      Component Value Range   Troponin I <0.30  <0.30 ng/mL  PROTIME-INR     Status: Normal   Collection Time   04/14/12  1:44 PM      Component Value Range   Prothrombin Time 13.4  11.6 - 15.2 seconds   INR 1.03  0.00 - 1.49  LIPASE, BLOOD     Status: Normal   Collection Time   04/14/12  1:44 PM      Component Value Range   Lipase 32  11 - 59 U/L  HEPATIC FUNCTION PANEL     Status: Abnormal   Collection Time   04/14/12  1:44 PM      Component Value Range   Total Protein 8.1  6.0 - 8.3 g/dL   Albumin 3.8  3.5 - 5.2 g/dL   AST 19  0 - 37 U/L   ALT 20  0 - 35 U/L   Alkaline Phosphatase 78  39 - 117 U/L   Total Bilirubin 0.2 (*) 0.3 - 1.2 mg/dL   Bilirubin, Direct <2.1  0.0 - 0.3 mg/dL   Indirect Bilirubin NOT CALCULATED  0.3 - 0.9 mg/dL    Radiology/Studies: Dg Chest 2 View  04/14/2012  *RADIOLOGY REPORT*  Clinical Data: Chest pain and shortness of breath.  CHEST - 2 VIEW  Comparison: 11/07/2009.  Findings: The cardiac silhouette remains borderline enlarged.  The pulmonary vasculature and interstitial markings are mildly prominent with no pleural fluid seen.  Thoracic spine degenerative changes.  IMPRESSION:  1.  Borderline cardiomegaly and mild pulmonary vascular congestion. 2.  Mild chronic interstitial lung disease with possible minimal superimposed interstitial pulmonary edema.   Original Report Authenticated By: Beckie Salts, M.D.    Ct Head Wo Contrast  04/14/2012  *RADIOLOGY REPORT*  Clinical Data: Loss of  consciousness.  CT HEAD WITHOUT CONTRAST  Technique:  Contiguous axial images were obtained from the base of the skull through the vertex without contrast.  Comparison: None.   Findings: Bilateral hyperostosis frontalis and bilateral dural ossification.  Normal appearing cerebral hemispheres and posterior fossa structures.  Normal size and position of the ventricles.  No intracranial hemorrhage, mass lesion or CT evidence of acute infarction.  No fractures or paranasal sinus air-fluid levels.  IMPRESSION: No acute abnormality.   Original Report Authenticated By: Beckie Salts, M.D.    Ct Angio Chest W/cm &/or Wo Cm  04/14/2012  *RADIOLOGY REPORT*  Clinical Data: Chest pain, shortness of breath, tachycardia  CT ANGIOGRAPHY CHEST  Technique:  Multidetector CT imaging of the chest using the standard protocol during bolus administration of intravenous contrast. Multiplanar reconstructed images including MIPs were obtained and reviewed to evaluate the vascular anatomy.  Contrast: OMNIPAQUE IOHEXOL 350 MG/ML SOLN  Comparison: Chest radiographs dated 04/14/2012  Findings: Suboptimal contrast opacification secondary to body habitus/bolus timing.  No evidence of pulmonary embolism.  Lungs are essentially clear.  No suspicious pulmonary nodules.  No pleural effusion or pneumothorax.  Visualized thyroid is unremarkable.  The heart is normal in size. No pericardial effusion.  No suspicious mediastinal, hilar, or axillary lymphadenopathy.  Visualized upper abdomen is notable for moderate hepatic steatosis.  Degenerative changes of the visualized thoracolumbar spine.  IMPRESSION: No evidence of pulmonary embolism.  No evidence of acute cardiopulmonary disease.   Original Report Authenticated By: Charline Bills, M.D.     EKG:  Sinus tachycardia  113 bpm.  QTc 488.  ASSESSMENT AND PLAN:  Patient is a 57 yo with a history of CP and now syncope. The patient's chest pain is atypical for angina.  Syncopal spell ? Vagal.  CT was negatvie for PE I have reviewed echo  It shows normal LV and RV systolic function.  I would recomm checking orthostatics I would also set up for stress test to  evaluate for ischemia.  CP is atypical I would check fasting lipids.     Signed, Dietrich Pates 04/14/2012, 2:42 PM

## 2012-04-14 NOTE — ED Notes (Signed)
Pt over for CT at this time.

## 2012-04-15 ENCOUNTER — Encounter (HOSPITAL_COMMUNITY): Payer: Self-pay | Admitting: Internal Medicine

## 2012-04-15 DIAGNOSIS — I10 Essential (primary) hypertension: Secondary | ICD-10-CM

## 2012-04-15 DIAGNOSIS — E1169 Type 2 diabetes mellitus with other specified complication: Secondary | ICD-10-CM | POA: Diagnosis present

## 2012-04-15 DIAGNOSIS — E669 Obesity, unspecified: Secondary | ICD-10-CM | POA: Diagnosis present

## 2012-04-15 HISTORY — DX: Essential (primary) hypertension: I10

## 2012-04-15 LAB — COMPREHENSIVE METABOLIC PANEL
ALT: 16 U/L (ref 0–35)
Albumin: 3.4 g/dL — ABNORMAL LOW (ref 3.5–5.2)
Alkaline Phosphatase: 68 U/L (ref 39–117)
BUN: 11 mg/dL (ref 6–23)
CO2: 27 mEq/L (ref 19–32)
Chloride: 102 mEq/L (ref 96–112)
Creatinine, Ser: 0.79 mg/dL (ref 0.50–1.10)
Glucose, Bld: 127 mg/dL — ABNORMAL HIGH (ref 70–99)

## 2012-04-15 LAB — CBC
HCT: 38 % (ref 36.0–46.0)
Hemoglobin: 12.1 g/dL (ref 12.0–15.0)
MCHC: 31.8 g/dL (ref 30.0–36.0)
MCV: 71.4 fL — ABNORMAL LOW (ref 78.0–100.0)
RDW: 16.4 % — ABNORMAL HIGH (ref 11.5–15.5)

## 2012-04-15 LAB — LIPID PANEL
HDL: 44 mg/dL (ref >39)
LDL Cholesterol: 153 mg/dL — ABNORMAL HIGH (ref 0–99)
Triglycerides: 88 mg/dL (ref <150)
VLDL: 18 mg/dL (ref 0–40)

## 2012-04-15 MED ORDER — HYDROCHLOROTHIAZIDE 12.5 MG PO CAPS
12.5000 mg | ORAL_CAPSULE | Freq: Every day | ORAL | Status: DC
  Administered 2012-04-15: 12.5 mg via ORAL
  Filled 2012-04-15: qty 1

## 2012-04-15 MED ORDER — METOPROLOL SUCCINATE ER 25 MG PO TB24: 25.0000 mg | ORAL_TABLET | Freq: Every day | ORAL | Status: DC

## 2012-04-15 MED ORDER — ATORVASTATIN CALCIUM 20 MG PO TABS: 20.0000 mg | ORAL_TABLET | Freq: Every day | ORAL | Status: DC

## 2012-04-15 MED ORDER — OMEPRAZOLE 20 MG PO CPDR: 20.0000 mg | DELAYED_RELEASE_CAPSULE | Freq: Every day | ORAL | Status: DC

## 2012-04-15 MED ORDER — METOPROLOL SUCCINATE ER 25 MG PO TB24
25.0000 mg | ORAL_TABLET | Freq: Every day | ORAL | Status: DC
  Administered 2012-04-15: 25 mg via ORAL
  Filled 2012-04-15: qty 1

## 2012-04-15 MED ORDER — ENOXAPARIN SODIUM 100 MG/ML ~~LOC~~ SOLN: 90.0000 mg | SUBCUTANEOUS | Status: DC

## 2012-04-15 NOTE — Care Management Note (Signed)
    Page 1 of 1   04/15/2012     11:00:42 AM   CARE MANAGEMENT NOTE 04/15/2012  Patient:  Tracey [name], Tracey [name]   Account Number:  [id number]  Date Initiated:  04/15/2012  Documentation initiated by:  Rosemary Holms  Subjective/Objective Assessment:   Pt admitted for syncope and collaspe. Has seen Dr. Lodema Hong in the past and per pt, she will call MD herself and make f/u appt. No needs identified.     Action/Plan:   Anticipated DC Date:  04/15/2012   Anticipated DC Plan:  HOME/SELF CARE      DC Planning Services  CM consult      Choice offered to / List presented to:             Status of service:  Completed, signed off Medicare Important Message given?   (If response is "NO", the following Medicare IM given date fields will be blank) Date Medicare IM given:   Date Additional Medicare IM given:    Discharge Disposition:    Per UR Regulation:    If discussed at Long Length of Stay Meetings, dates discussed:    Comments:  1/3/141030 Rosemary Holms RN BSN CM

## 2012-04-15 NOTE — Progress Notes (Signed)
Unable to schedule stress test as patient above weight limit of scanner at [facility] as well. Pain was atypical  Would follow  Rx for GI causes.  Appt in cardiology clinic Fri Jan 17th at 1 PM with Joni Reining to see how responds.  OK to d/c from cardiac standpoint.

## 2012-04-15 NOTE — Progress Notes (Signed)
UR Chart Review Completed  

## 2012-04-15 NOTE — Progress Notes (Signed)
Subjective: Patient denies CP  No dizziness  No SOB at rest. Objective: Filed Vitals:   04/14/12 2136 04/15/12 0436 04/15/12 0438 04/15/12 0440  BP: 129/79 95/61 149/88 161/106  Pulse: 93 87 95 106  Temp: 98 F (36.7 C) 97 F (36.1 C)    TempSrc: Oral Oral    Resp: 20 20    Height:      Weight:      SpO2: 99% 97%     Weight change:   Intake/Output Summary (Last 24 hours) at 04/15/12 0850 Last data filed at 04/14/12 1800  Gross per 24 hour  Intake    600 ml  Output      0 ml  Net    600 ml    General: Morbidly obese in NAD Neck:  JVP is normal Heart: Regular rate and rhythm, without murmurs, rubs, gallops.  Lungs: Clear to auscultation.  No rales or wheezes. Exemities:  No edema.   Neuro: Grossly intact, nonfocal.  Tele:  SR Lab Results: Results for orders placed during the hospital encounter of 04/14/12 (from the past 24 hour(s))  CBC     Status: Abnormal   Collection Time   04/14/12  9:52 AM      Component Value Range   WBC 10.7 (*) 4.0 - 10.5 K/uL   RBC 5.73 (*) 3.87 - 5.11 MIL/uL   Hemoglobin 13.1  12.0 - 15.0 g/dL   HCT 40.9  81.1 - 91.4 %   MCV 71.0 (*) 78.0 - 100.0 fL   MCH 22.9 (*) 26.0 - 34.0 pg   MCHC 32.2  30.0 - 36.0 g/dL   RDW 78.2 (*) 95.6 - 21.3 %   Platelets 271  150 - 400 K/uL  TROPONIN I     Status: Normal   Collection Time   04/14/12  9:52 AM      Component Value Range   Troponin I <0.30  <0.30 ng/mL  BASIC METABOLIC PANEL     Status: Abnormal   Collection Time   04/14/12  9:52 AM      Component Value Range   Sodium 138  135 - 145 mEq/L   Potassium 3.6  3.5 - 5.1 mEq/L   Chloride 101  96 - 112 mEq/L   CO2 23  19 - 32 mEq/L   Glucose, Bld 145 (*) 70 - 99 mg/dL   BUN 14  6 - 23 mg/dL   Creatinine, Ser 0.86  0.50 - 1.10 mg/dL   Calcium 9.0  8.4 - 57.8 mg/dL   GFR calc non Af Amer 76 (*) >90 mL/min   GFR calc Af Amer 88 (*) >90 mL/min  TSH     Status: Normal   Collection Time   04/14/12  1:44 PM      Component Value Range   TSH 0.519   0.350 - 4.500 uIU/mL  HEMOGLOBIN A1C     Status: Abnormal   Collection Time   04/14/12  1:44 PM      Component Value Range   Hemoglobin A1C 6.4 (*) <5.7 %   Mean Plasma Glucose 137 (*) <117 mg/dL  TROPONIN I     Status: Normal   Collection Time   04/14/12  1:44 PM      Component Value Range   Troponin I <0.30  <0.30 ng/mL  PROTIME-INR     Status: Normal   Collection Time   04/14/12  1:44 PM      Component Value Range   Prothrombin Time  13.4  11.6 - 15.2 seconds   INR 1.03  0.00 - 1.49  LIPASE, BLOOD     Status: Normal   Collection Time   04/14/12  1:44 PM      Component Value Range   Lipase 32  11 - 59 U/L  HEPATIC FUNCTION PANEL     Status: Abnormal   Collection Time   04/14/12  1:44 PM      Component Value Range   Total Protein 8.1  6.0 - 8.3 g/dL   Albumin 3.8  3.5 - 5.2 g/dL   AST 19  0 - 37 U/L   ALT 20  0 - 35 U/L   Alkaline Phosphatase 78  39 - 117 U/L   Total Bilirubin 0.2 (*) 0.3 - 1.2 mg/dL   Bilirubin, Direct <4.0  0.0 - 0.3 mg/dL   Indirect Bilirubin NOT CALCULATED  0.3 - 0.9 mg/dL  TROPONIN I     Status: Normal   Collection Time   04/14/12  6:35 PM      Component Value Range   Troponin I <0.30  <0.30 ng/mL  TROPONIN I     Status: Normal   Collection Time   04/14/12 11:39 PM      Component Value Range   Troponin I <0.30  <0.30 ng/mL  COMPREHENSIVE METABOLIC PANEL     Status: Abnormal   Collection Time   04/15/12  4:41 AM      Component Value Range   Sodium 138  135 - 145 mEq/L   Potassium 3.9  3.5 - 5.1 mEq/L   Chloride 102  96 - 112 mEq/L   CO2 27  19 - 32 mEq/L   Glucose, Bld 127 (*) 70 - 99 mg/dL   BUN 11  6 - 23 mg/dL   Creatinine, Ser 9.81  0.50 - 1.10 mg/dL   Calcium 8.5  8.4 - 19.1 mg/dL   Total Protein 7.3  6.0 - 8.3 g/dL   Albumin 3.4 (*) 3.5 - 5.2 g/dL   AST 19  0 - 37 U/L   ALT 16  0 - 35 U/L   Alkaline Phosphatase 68  39 - 117 U/L   Total Bilirubin 0.4  0.3 - 1.2 mg/dL   GFR calc non Af Amer >90  >90 mL/min   GFR calc Af Amer >90  >90 mL/min    CBC     Status: Abnormal   Collection Time   04/15/12  4:41 AM      Component Value Range   WBC 9.1  4.0 - 10.5 K/uL   RBC 5.32 (*) 3.87 - 5.11 MIL/uL   Hemoglobin 12.1  12.0 - 15.0 g/dL   HCT 47.8  29.5 - 62.1 %   MCV 71.4 (*) 78.0 - 100.0 fL   MCH 22.7 (*) 26.0 - 34.0 pg   MCHC 31.8  30.0 - 36.0 g/dL   RDW 30.8 (*) 65.7 - 84.6 %   Platelets 278  150 - 400 K/uL  LIPID PANEL     Status: Abnormal   Collection Time   04/15/12  4:41 AM      Component Value Range   Cholesterol 215 (*) 0 - 200 mg/dL   Triglycerides 88  <962 mg/dL   HDL 44  >95 mg/dL   Total CHOL/HDL Ratio 4.9     VLDL 18  0 - 40 mg/dL   LDL Cholesterol 284 (*) 0 - 99 mg/dL    Studies/Results: @RISRSLT24 @  Medications: Reviewed  Patient Active Hospital Problem List:  1.  Syncope   Work up so far is unrevealing  R/O for MI with CP  Carotids neg  Tele negative  Orthostatics negative. QT is not as long as yesterday. Will set up for myoview at [facility]) Will also f/u in clinic  I would not schedule further testing for now  2.  HTN  Will need to be followed  3.  CP  As above  4.  Obesity  COunselled on wt loss  Admits to liking sweets, sodas.  5.  HL  LDL was 153  Agree with referral to dietary for wt management and lipids  I would not start statin now.  Give trial to get down on own.  If can't then consider.   LOS: 1 day   Dietrich Pates 04/15/2012, 8:50 AM

## 2012-04-15 NOTE — Progress Notes (Signed)
  RD consulted for nutrition education regarding pre-diabetes.  Lab Results  Component Value Date   HGBA1C 6.4* 04/14/2012    RD provided "Carbohydrate Counting for People with Diabetes" handout from the Academy of Nutrition and Dietetics. Discussed different food groups and their effects on blood sugar, emphasizing carbohydrate-containing foods. Provided list of carbohydrates and recommended serving sizes of common foods.  Discussed importance of controlled and consistent carbohydrate intake throughout the day. Provided examples of ways to balance meals/snacks and encouraged intake of high-fiber, whole grain complex carbohydrates. Teach back method used.  Expect fair compliance.  Body mass index is 56.96 kg/(m^2). Pt meets criteria for Obesity Class III based on current BMI.  Current diet order is Heart Healthy, patient is consuming approximately 90% of meals at this time. Labs and medications reviewed. No further nutrition interventions warranted at this time. RD contact information provided. If additional nutrition issues arise, please re-consult RD.  #[phone]

## 2012-04-15 NOTE — Progress Notes (Signed)
04/15/12 0827 Patient to have possible stress test today per report, night shift nurse stated had discussed with patient to remain NPO until finding out for sure if test to be done. On rounds this morning, night shift nurse reminded patient not to eat breakfast, patient stated okay. NPO order placed and sign placed outside room this morning, on assessment patient found to have eaten breakfast tray. Stated "what are they planning today, i've heard about those stress tests, not sure I can do that". Notified Dr Fisher of patient having eaten breakfast. Will discuss with cardiology. Rahma Meller, RN 

## 2012-04-15 NOTE — Evaluation (Signed)
Physical Therapy Evaluation Patient Details Name: Tracey [name] MRN: [id number] DOB: 11-Oct-1955 Today's Date: 04/15/2012 Time: 1105-1130 PT Time Calculation (min): 25 min  PT Assessment / Plan / Recommendation Clinical Impression  Pt was seen for eval.  she has chronic bilateral knee pain, probably due to some DJD and morbid obesity.  Her gait is very antalgic and lumbering with a cane...she was instructed in gait with a rolling walker and gait is much more functional and smooth.  She finds the walker to be very helpful and hopefully this will motivate her tto be more active.  She is agreeable to using one at home.    PT Assessment  Patient needs continued PT services;Patent does not need any further PT services    Follow Up Recommendations       Does the patient have the potential to tolerate intense rehabilitation      Barriers to Discharge None      Equipment Recommendations       Recommendations for Other Services     Frequency      Precautions / Restrictions Precautions Precautions: None Restrictions Weight Bearing Restrictions: No   Pertinent Vitals/Pain       Mobility  Bed Mobility Bed Mobility: Rolling Right;Rolling Left Rolling Right: 7: Independent Rolling Left: 7: Independent Supine to Sit: 7: Independent Sit to Supine: 7: Independent Transfers Transfers: Sit to Stand;Stand to Sit Sit to Stand: 7: Independent Stand to Sit: 7: Independent Ambulation/Gait Ambulation/Gait Assistance: 7: Independent Ambulation Distance (Feet): 15 Feet (15' with cane, 27' with walker) Assistive device: Rolling walker;Straight cane Gait Pattern: Step-through pattern;Decreased stance time - right;Antalgic;Trunk flexed General Gait Details: pt has an extremely antalgic, lumbering gait pattern which is putting excessive forces on all of her joints... her gait is far smoother when using a RW and she was instructed in gait with same. Stairs: No Wheelchair  Mobility Wheelchair Mobility: No    Shoulder Instructions     Exercises     PT Diagnosis:    PT Problem List:   PT Treatment Interventions:     PT Goals    Visit Information  Last PT Received On: 04/15/12    Subjective Data  Subjective: my knees hurt a lot of the time Patient Stated Goal: return home   Prior Functioning  Home Living Lives With: Family Available Help at Discharge: Family;Available 24 hours/day Type of Home: House Home Access: Level entry Home Layout: One level Home Adaptive Equipment: Quad cane Prior Function Level of Independence: Independent with assistive device(s) Able to Take Stairs?: Yes Driving: Yes Communication Communication: No difficulties    Cognition  Overall Cognitive Status: Appears within functional limits for tasks assessed/performed Arousal/Alertness: Awake/alert Orientation Level: Appears intact for tasks assessed Behavior During Session: [facility] for tasks performed    Extremity/Trunk Assessment Right Upper Extremity Assessment RUE ROM/Strength/Tone: Within functional levels Left Upper Extremity Assessment LUE ROM/Strength/Tone: Within functional levels Right Lower Extremity Assessment RLE ROM/Strength/Tone: Deficits RLE ROM/Strength/Tone Deficits: quad strength 3+/5 (which is not strong enough for her weight) RLE Sensation: WFL - Light Touch Left Lower Extremity Assessment LLE ROM/Strength/Tone: Deficits LLE ROM/Strength/Tone Deficits: quad strength 3+/5 LLE Sensation: WFL - Light Touch   Balance    End of Session PT - End of Session Activity Tolerance: Patient tolerated treatment well Patient left: in bed  GP Functional Assessment Tool Used: clinical judgement Functional Limitation: Mobility: Walking and moving around Mobility: Walking and Moving Around Current Status ([id number]): 0 percent impaired, limited or restricted Mobility: Walking and  Moving Around Goal Status [phone]): 0 percent impaired, limited or  restricted Mobility: Walking and Moving Around Discharge Status [phone]): 0 percent impaired, limited or restricted   Konrad Penta 04/15/2012, 11:41 AM

## 2012-04-15 NOTE — Discharge Summary (Signed)
Physician Discharge Summary  Tracey [name] EAV:[id number] DOB: Jul 16, 1955 DOA: 04/14/2012  PCP: No primary provider on file. Syliva Overman, M.D. pending  Admit date: 04/14/2012 Discharge date: 04/15/2012  Time spent: Greater than 30 minutes  Recommendations for Outpatient Follow-up:  1. Dr. Anthony Sar office will call the patient with an appointment. 2. She will followup with NP, Joni Reining for cardiology followup.  Discharge Diagnoses:  1. Syncope and collapse, likely vasovagal. 2. Chest pain. Myocardial infarction ruled out. 2-D echocardiogram results revealed preserved LV systolic function. Nuclear medicine cardiac stress test could not be performed due to the patient's morbid obesity as she exceeded the weight for the scanner. 3. Prediabetes. The patient's hemoglobin A1c was 6.4. 4. Hyperlipidemia. The patient's fasting lipid profile revealed a total cholesterol of 215, triglycerides of 88, HDL cholesterol 44, and LDL cholesterol of 153. 5. Mild hypertension. 6. GERD. 7. Morbid obesity. The patient's weight was 397 pounds.   Discharge Condition: Improved.  Diet recommendation: Carbohydrate modified.  Filed Weights   04/14/12 0953 04/14/12 1109 04/14/12 1342  Weight: 176.903 kg (390 lb) 180.248 kg (397 lb 6 oz) 180.078 kg (397 lb)    History of present illness:  The patient is a 57 year old woman with a remote history of pulmonary embolism, hyperlipidemia, and morbid obesity, who presented to the emergency Department 04/14/2012 with a chief complaint of chest pain and a loss of consciousness. In the emergency department, she was hypertensive with a blood pressure 169/105. Otherwise she was afebrile and hemodynamically stable. CT angiogram of her chest revealed no evidence of pulmonary embolism. CT scan of her head revealed no acute intracranial findings. Her EKG revealed sinus tachycardia with a heart rate of 113 beats per minute. Her lab data were significant for a normal  troponin I., glucose of 145, and a WBC of 10.7. She was admitted for further evaluation and management.  Hospital Course:  The patient was started on gentle IV fluids. She received an aspirin in the emergency department and was continued on 81 mg of aspirin daily. Her pain was treated with as needed opiates. In addition, sublingual nitroglycerin was ordered for chest pain. Given her history of GERD, Protonix was started. The patient had been off of her PPI for several years. Metoprolol at 12.5 mg twice a day was added for treatment of her elevated blood pressure. Neuro checks were ordered every shift.   A number of studies were ordered for evaluation. All of her cardiac enzymes were well within normal limits. Her TSH was within normal limits at 0.5. Her hemoglobin A1c was 6.4, just under the cut off for a clinical diagnosis of diabetes mellitus. Therefore, she was diagnosed with prediabetes. She was informed of this. The registered dietitian was consulted and provided the patient with education and recommendations to lose weight and to modify her diet to decrease her risk of developing full-blown diabetes. The patient was receptive. The results of her fasting lipid panel were dictated above. She was subsequently started on Lipitor. The results of her 2-D echocardiogram revealed preserved LV function and no regional wall motion abnormalities. Her carotid ultrasound revealed no ICA stenosis. Cardiologist, Dr. Huston Foley was consulted. Per her assessment, the patient's chest pain appeared to be atypical. She believes that her syncopal episode may have been a vagal response. She recommended checking orthostatic vital signs which was done. The patient was not orthostatic. Otherwise, she agreed with medical management. She ordered a nuclear medicine stress test for further evaluation. Unfortunately, because of the patient's  body habitus, her weight was beyond the weight limit for the scanner.  The patient had no  presyncopal or syncopal episodes during the hospitalization. She had no complaints of chest pain. She was encouraged to try to cut down on her fatty foods and foods that were high in sugar. She was also encouraged to gradually become more active with walking short distances. Her medication regimen at the time of discharge consisted of a statin, PPI, and beta blocker. Aspirin was discontinued. She was discharged to home in improved and stable condition.    Procedures: 2-D echocardiogram: Study Conclusions  Left ventricle: The cavity size was normal. Wall thickness was increased in a pattern of mild LVH. Systolic function was normal. The estimated ejection fraction was in the range of 55% to 60%.  Impressions:  - Poor acoustic windows. Transthoracic echocardiography. M-mode, complete 2D, spectral Doppler, and color Doppler. Height: Height: 177.8cm. Height: 70in. Weight: Weight: 180.1kg. Weight: 396.2lb. Body mass index: BMI: 57kg/m^2. Body surface area: BSA: 2.92m^2. Patient status: Inpatient. Location: Bedside.     Consultations:  Cardiologist, Huston Foley M.D.  Discharge Exam: Filed Vitals:   04/15/12 0436 04/15/12 0438 04/15/12 0440 04/15/12 1153  BP: 95/61 149/88 161/106 135/80  Pulse: 87 95 106 90  Temp: 97 F (36.1 C)     TempSrc: Oral     Resp: 20   18  Height:      Weight:      SpO2: 97%   97%    General: Alert obese 57 year old African-American woman sitting up in bed, in no acute distress. Cardiovascular: S1, S2, with a soft systolic murmur. Respiratory: Clear to auscultation bilaterally. Neurologic: She is alert and oriented x3. Cranial nerves II through XII are intact.  Discharge Instructions  Discharge Orders    Future Appointments: Provider: Department: Dept Phone: Center:   04/29/2012 1:00 PM Jodelle Gross, NP [facility] [phone] [email or web address]     Future Orders Please Complete By Expires   Diet Carb Modified      Increase  activity slowly          Medication List     As of 04/15/2012 11:58 AM    TAKE these medications         atorvastatin 20 MG tablet   Commonly known as: LIPITOR   Take 1 tablet (20 mg total) by mouth daily at 6 PM. For treatment of high cholesterol.      metoprolol succinate 25 MG 24 hr tablet   Commonly known as: TOPROL-XL   Take 1 tablet (25 mg total) by mouth daily. For treatment of high blood pressure.      omeprazole 20 MG capsule   Commonly known as: PRILOSEC   Take 1 capsule (20 mg total) by mouth daily. 4 acid reflux.           Follow-up Information    Follow up with Joni Reining, NP. On 04/29/2012. (Cardiology followup at 1:00 PM)    Contact information:   [address] [place] [address] [phone]       Follow up with Syliva Overman, MD. (Dr. Anthony Sar office will call you with an appointment.)    Contact information:   [address] [place] [address] [phone]           The results of significant diagnostics from this hospitalization (including imaging, microbiology, ancillary and laboratory) are listed below for reference.    Significant Diagnostic Studies: Dg Chest 2 View  04/14/2012  *RADIOLOGY REPORT*  Clinical Data: Chest pain and shortness of breath.  CHEST - 2 VIEW  Comparison: 11/07/2009.  Findings: The cardiac silhouette remains borderline enlarged.  The pulmonary vasculature and interstitial markings are mildly prominent with no pleural fluid seen.  Thoracic spine degenerative changes.  IMPRESSION:  1.  Borderline cardiomegaly and mild pulmonary vascular congestion. 2.  Mild chronic interstitial lung disease with possible minimal superimposed interstitial pulmonary edema.   Original Report Authenticated By: Beckie Salts, M.D.    Ct Head Wo Contrast  04/14/2012  *RADIOLOGY REPORT*  Clinical Data: Loss of consciousness.  CT HEAD WITHOUT CONTRAST  Technique:  Contiguous axial images were obtained from the base of the  skull through the vertex without contrast.  Comparison: None.  Findings: Bilateral hyperostosis frontalis and bilateral dural ossification.  Normal appearing cerebral hemispheres and posterior fossa structures.  Normal size and position of the ventricles.  No intracranial hemorrhage, mass lesion or CT evidence of acute infarction.  No fractures or paranasal sinus air-fluid levels.  IMPRESSION: No acute abnormality.   Original Report Authenticated By: Beckie Salts, M.D.    Ct Angio Chest W/cm &/or Wo Cm  04/14/2012  *RADIOLOGY REPORT*  Clinical Data: Chest pain, shortness of breath, tachycardia  CT ANGIOGRAPHY CHEST  Technique:  Multidetector CT imaging of the chest using the standard protocol during bolus administration of intravenous contrast. Multiplanar reconstructed images including MIPs were obtained and reviewed to evaluate the vascular anatomy.  Contrast: OMNIPAQUE IOHEXOL 350 MG/ML SOLN  Comparison: Chest radiographs dated 04/14/2012  Findings: Suboptimal contrast opacification secondary to body habitus/bolus timing.  No evidence of pulmonary embolism.  Lungs are essentially clear.  No suspicious pulmonary nodules.  No pleural effusion or pneumothorax.  Visualized thyroid is unremarkable.  The heart is normal in size. No pericardial effusion.  No suspicious mediastinal, hilar, or axillary lymphadenopathy.  Visualized upper abdomen is notable for moderate hepatic steatosis.  Degenerative changes of the visualized thoracolumbar spine.  IMPRESSION: No evidence of pulmonary embolism.  No evidence of acute cardiopulmonary disease.   Original Report Authenticated By: Charline Bills, M.D.    US Carotid Duplex Bilateral  04/14/2012  *RADIOLOGY REPORT*  Clinical Data: Syncope  BILATERAL CAROTID DUPLEX ULTRASOUND  Technique: Wallace Cullens scale imaging, color Doppler and duplex ultrasound was performed of bilateral carotid and vertebral arteries in the neck.  Comparison:  Head CT performed concurrently on 04/14/2012   Criteria:  Quantification of carotid stenosis is based on velocity parameters that correlate the residual internal carotid diameter with NASCET-based stenosis levels, using the diameter of the distal internal carotid lumen as the denominator for stenosis measurement.  The following velocity measurements were obtained:                   PEAK SYSTOLIC/END DIASTOLIC RIGHT ICA:                        75/24cm/sec CCA:                        121/9cm/sec SYSTOLIC ICA/CCA RATIO:     0.6 DIASTOLIC ICA/CCA RATIO:    2.8 ECA:                        112cm/sec  LEFT ICA:                        75/14cm/sec CCA:  122/16cm/sec SYSTOLIC ICA/CCA RATIO:     0.6 DIASTOLIC ICA/CCA RATIO:    0.9 ECA:                        71cm/sec  Findings:  RIGHT CAROTID ARTERY: Mild intimal medial thickening without significant focal atherosclerotic plaque or stenosis by gray scale, color Doppler or spectral waveform analysis.  RIGHT VERTEBRAL ARTERY:  Patent with antegrade flow  LEFT CAROTID ARTERY: Mild intimal medial thickening without significant focal atherosclerotic plaque or stenosis by gray scale, color Doppler spectral waveform analysis.  LEFT VERTEBRAL ARTERY:  Patent with normal antegrade flow  IMPRESSION:  1.  Mildly limited examination secondary to patient body habitus.  2.  Mild intimal medial thickening in the bilateral common carotid arteries without significant focal atherosclerotic plaque or stenosis.  3.  Bilateral vertebral arteries are patent with antegrade flow.  Signed,  Sterling Big, MD Vascular & Interventional Radiologist [facility] Radiology   Original Report Authenticated By: Malachy Moan, M.D.     Microbiology: No results found for this or any previous visit (from the past 240 hour(s)).   Labs: Basic Metabolic Panel:  Lab 04/15/12 1610 04/14/12 0952  NA 138 138  K 3.9 3.6  CL 102 101  CO2 27 23  GLUCOSE 127* 145*  BUN 11 14  CREATININE 0.79 0.85  CALCIUM 8.5 9.0  MG --  --  PHOS -- --   Liver Function Tests:  Lab 04/15/12 0441 04/14/12 1344  AST 19 19  ALT 16 20  ALKPHOS 68 78  BILITOT 0.4 0.2*  PROT 7.3 8.1  ALBUMIN 3.4* 3.8    Lab 04/14/12 1344  LIPASE 32  AMYLASE --   No results found for this basename: AMMONIA:5 in the last 168 hours CBC:  Lab 04/15/12 0441 04/14/12 0952  WBC 9.1 10.7*  NEUTROABS -- --  HGB 12.1 13.1  HCT 38.0 40.7  MCV 71.4* 71.0*  PLT 278 271   Cardiac Enzymes:  Lab 04/14/12 2339 04/14/12 1835 04/14/12 1344 04/14/12 0952  CKTOTAL -- -- -- --  CKMB -- -- -- --  CKMBINDEX -- -- -- --  TROPONINI <0.30 <0.30 <0.30 <0.30   BNP: BNP (last 3 results) No results found for this basename: PROBNP:3 in the last 8760 hours CBG: No results found for this basename: GLUCAP:5 in the last 168 hours     Signed:  Zaelynn Fuchs  [facility] 04/15/2012, 11:58 AM

## 2012-04-15 NOTE — Progress Notes (Signed)
04/15/12 0950 Called [facility] to leave message for Dr Tenny Craw regarding patient eating breakfast this morning, and stress test order. Spoke with Aurther Loft, stated stress test not to be done today. Earnstine Regal, RN

## 2012-04-15 NOTE — Progress Notes (Signed)
04/15/12 1323 Patient being discharged home. Reviewed discharge instructions with patient, given copy of instructions, medication list, prescriptions, f/u appointments. Verbalized understanding. Denies chest pain or other discomfort. IV site d/c'd, within normal limits. Pt in stable condition awaiting discharge home. Earnstine Regal, RN

## 2012-04-29 ENCOUNTER — Encounter: Payer: Self-pay | Admitting: *Deleted

## 2012-04-29 ENCOUNTER — Encounter: Payer: Self-pay | Admitting: Adult Health

## 2012-04-29 ENCOUNTER — Ambulatory Visit (INDEPENDENT_AMBULATORY_CARE_PROVIDER_SITE_OTHER): Payer: BC Managed Care – PPO | Admitting: Adult Health

## 2012-04-29 VITALS — BP 116/80 | HR 87 | Ht 70.0 in | Wt >= 6400 oz

## 2012-04-29 DIAGNOSIS — I10 Essential (primary) hypertension: Secondary | ICD-10-CM

## 2012-04-29 DIAGNOSIS — R0789 Other chest pain: Secondary | ICD-10-CM

## 2012-04-29 DIAGNOSIS — R079 Chest pain, unspecified: Secondary | ICD-10-CM

## 2012-04-29 DIAGNOSIS — E669 Obesity, unspecified: Secondary | ICD-10-CM

## 2012-04-29 NOTE — Assessment & Plan Note (Signed)
I am not certain that this pain is cardiac in etiology. Due to body mass, she was unable to have stress myoview. I will, therefore, plan for dobutamine stress echo with definity for diagnostic/prognostic purposes. She will continue current medications and return to see Ddr.Ross on follow up for discussion of test results.

## 2012-04-29 NOTE — Assessment & Plan Note (Signed)
Currently well controlled. No changes in medications at this time. 

## 2012-04-29 NOTE — Patient Instructions (Addendum)
Your physician recommends that you schedule a follow-up appointment in: POST TEST WITH MD ROSS  Your physician has requested that you have a stress echocardiogram. For further information please visit [email or web address] Please follow instruction sheet as given.  RE-ESTABLISH WITH MD SIMPSON AS YOUR PCP

## 2012-04-29 NOTE — Assessment & Plan Note (Signed)
We have discussed talking with her PCP about Bariatric surgery referral. She is willing to ask Dr.Simpson on follow up appointment.

## 2012-04-29 NOTE — Progress Notes (Deleted)
Name: Tracey [name]    DOB: 1956/04/01  Age: 57 y.o.  MR#: [id number]       PCP:  Joni Reining, NP      Insurance: @PAYORNAME @   CC:    Chief Complaint  Patient presents with  . Follow-up    tightness in chest    VS BP 116/80  Pulse 87  Ht 5\' 10"  (1.778 m)  Wt 404 lb 1.3 oz (183.289 kg)  BMI 57.98 kg/m2  SpO2 97%  Weights Current Weight  04/29/12 404 lb 1.3 oz (183.289 kg)  04/14/12 397 lb (180.078 kg)  12/06/10 360 lb (163.295 kg)    Blood Pressure  BP Readings from Last 3 Encounters:  04/29/12 116/80  04/15/12 135/80  12/06/10 136/82     Admit date:  (Not on file) Last encounter with RMR:  Visit date not found   Allergy Allergies  Allergen Reactions  . Haloperidol Lactate Anaphylaxis    Current Outpatient Prescriptions  Medication Sig Dispense Refill  . atorvastatin (LIPITOR) 20 MG tablet Take 1 tablet (20 mg total) by mouth daily at 6 PM. For treatment of high cholesterol.  30 tablet  3  . metoprolol succinate (TOPROL-XL) 25 MG 24 hr tablet Take 1 tablet (25 mg total) by mouth daily. For treatment of high blood pressure.  30 tablet  3  . omeprazole (PRILOSEC) 20 MG capsule Take 1 capsule (20 mg total) by mouth daily. 4 acid reflux.  30 capsule  3    Discontinued Meds:   There are no discontinued medications.  Patient Active Problem List  Diagnosis  . HYPERLIPIDEMIA  . Morbid obesity  . GERD  . BACK PAIN, LUMBAR  . TROCHANTERIC BURSITIS, RIGHT  . Syncope and collapse  . Chest pain  . Hyperglycemia  . Tachycardia  . Diastolic dysfunction  . Prediabetes  . Hypertension    LABS Admission on 04/14/2012, Discharged on 04/15/2012  Component Date Value  . WBC 04/14/2012 10.7*  . RBC 04/14/2012 5.73*  . Hemoglobin 04/14/2012 13.1   . HCT 04/14/2012 40.7   . MCV 04/14/2012 71.0*  . MCH 04/14/2012 22.9*  . MCHC 04/14/2012 32.2   . RDW 04/14/2012 16.0*  . Platelets 04/14/2012 271   . Troponin I 04/14/2012 <0.30   . Sodium 04/14/2012 138     . Potassium 04/14/2012 3.6   . Chloride 04/14/2012 101   . CO2 04/14/2012 23   . Glucose, Bld 04/14/2012 145*  . BUN 04/14/2012 14   . Creatinine, Ser 04/14/2012 0.85   . Calcium 04/14/2012 9.0   . GFR calc non Af Amer 04/14/2012 76*  . GFR calc Af Amer 04/14/2012 88*  . TSH 04/14/2012 0.519   . Hemoglobin A1C 04/14/2012 6.4*  . Mean Plasma Glucose 04/14/2012 137*  . Troponin I 04/14/2012 <0.30   . Troponin I 04/14/2012 <0.30   . Troponin I 04/14/2012 <0.30   . Prothrombin Time 04/14/2012 13.4   . INR 04/14/2012 1.03   . Lipase 04/14/2012 32   . Total Protein 04/14/2012 8.1   . Albumin 04/14/2012 3.8   . AST 04/14/2012 19   . ALT 04/14/2012 20   . Alkaline Phosphatase 04/14/2012 78   . Total Bilirubin 04/14/2012 0.2*  . Bilirubin, Direct 04/14/2012 <0.1   . Indirect Bilirubin 04/14/2012 NOT CALCULATED   . Sodium 04/15/2012 138   . Potassium 04/15/2012 3.9   . Chloride 04/15/2012 102   . CO2 04/15/2012 27   . Glucose, Bld 04/15/2012 127*  .  BUN 04/15/2012 11   . Creatinine, Ser 04/15/2012 0.79   . Calcium 04/15/2012 8.5   . Total Protein 04/15/2012 7.3   . Albumin 04/15/2012 3.4*  . AST 04/15/2012 19   . ALT 04/15/2012 16   . Alkaline Phosphatase 04/15/2012 68   . Total Bilirubin 04/15/2012 0.4   . GFR calc non Af Amer 04/15/2012 >90   . GFR calc Af Amer 04/15/2012 >90   . WBC 04/15/2012 9.1   . RBC 04/15/2012 5.32*  . Hemoglobin 04/15/2012 12.1   . HCT 04/15/2012 38.0   . MCV 04/15/2012 71.4*  . MCH 04/15/2012 22.7*  . MCHC 04/15/2012 31.8   . RDW 04/15/2012 16.4*  . Platelets 04/15/2012 278   . Cholesterol 04/15/2012 215*  . Triglycerides 04/15/2012 88   . HDL 04/15/2012 44   . Total CHOL/HDL Ratio 04/15/2012 4.9   . VLDL 04/15/2012 18   . LDL Cholesterol 04/15/2012 153*     Results for this Opt Visit:     Results for orders placed during the hospital encounter of 04/14/12  CBC      Component Value Range   WBC 10.7 (*) 4.0 - 10.5 K/uL   RBC 5.73 (*)  3.87 - 5.11 MIL/uL   Hemoglobin 13.1  12.0 - 15.0 g/dL   HCT 16.1  09.6 - 04.5 %   MCV 71.0 (*) 78.0 - 100.0 fL   MCH 22.9 (*) 26.0 - 34.0 pg   MCHC 32.2  30.0 - 36.0 g/dL   RDW 40.9 (*) 81.1 - 91.4 %   Platelets 271  150 - 400 K/uL  TROPONIN I      Component Value Range   Troponin I <0.30  <0.30 ng/mL  BASIC METABOLIC PANEL      Component Value Range   Sodium 138  135 - 145 mEq/L   Potassium 3.6  3.5 - 5.1 mEq/L   Chloride 101  96 - 112 mEq/L   CO2 23  19 - 32 mEq/L   Glucose, Bld 145 (*) 70 - 99 mg/dL   BUN 14  6 - 23 mg/dL   Creatinine, Ser 7.82  0.50 - 1.10 mg/dL   Calcium 9.0  8.4 - 95.6 mg/dL   GFR calc non Af Amer 76 (*) >90 mL/min   GFR calc Af Amer 88 (*) >90 mL/min  TSH      Component Value Range   TSH 0.519  0.350 - 4.500 uIU/mL  HEMOGLOBIN A1C      Component Value Range   Hemoglobin A1C 6.4 (*) <5.7 %   Mean Plasma Glucose 137 (*) <117 mg/dL  TROPONIN I      Component Value Range   Troponin I <0.30  <0.30 ng/mL  TROPONIN I      Component Value Range   Troponin I <0.30  <0.30 ng/mL  TROPONIN I      Component Value Range   Troponin I <0.30  <0.30 ng/mL  PROTIME-INR      Component Value Range   Prothrombin Time 13.4  11.6 - 15.2 seconds   INR 1.03  0.00 - 1.49  LIPASE, BLOOD      Component Value Range   Lipase 32  11 - 59 U/L  HEPATIC FUNCTION PANEL      Component Value Range   Total Protein 8.1  6.0 - 8.3 g/dL   Albumin 3.8  3.5 - 5.2 g/dL   AST 19  0 - 37 U/L   ALT 20  0 -  35 U/L   Alkaline Phosphatase 78  39 - 117 U/L   Total Bilirubin 0.2 (*) 0.3 - 1.2 mg/dL   Bilirubin, Direct <1.6  0.0 - 0.3 mg/dL   Indirect Bilirubin NOT CALCULATED  0.3 - 0.9 mg/dL  COMPREHENSIVE METABOLIC PANEL      Component Value Range   Sodium 138  135 - 145 mEq/L   Potassium 3.9  3.5 - 5.1 mEq/L   Chloride 102  96 - 112 mEq/L   CO2 27  19 - 32 mEq/L   Glucose, Bld 127 (*) 70 - 99 mg/dL   BUN 11  6 - 23 mg/dL   Creatinine, Ser 1.09  0.50 - 1.10 mg/dL   Calcium 8.5   8.4 - 60.4 mg/dL   Total Protein 7.3  6.0 - 8.3 g/dL   Albumin 3.4 (*) 3.5 - 5.2 g/dL   AST 19  0 - 37 U/L   ALT 16  0 - 35 U/L   Alkaline Phosphatase 68  39 - 117 U/L   Total Bilirubin 0.4  0.3 - 1.2 mg/dL   GFR calc non Af Amer >90  >90 mL/min   GFR calc Af Amer >90  >90 mL/min  CBC      Component Value Range   WBC 9.1  4.0 - 10.5 K/uL   RBC 5.32 (*) 3.87 - 5.11 MIL/uL   Hemoglobin 12.1  12.0 - 15.0 g/dL   HCT 54.0  98.1 - 19.1 %   MCV 71.4 (*) 78.0 - 100.0 fL   MCH 22.7 (*) 26.0 - 34.0 pg   MCHC 31.8  30.0 - 36.0 g/dL   RDW 47.8 (*) 29.5 - 62.1 %   Platelets 278  150 - 400 K/uL  LIPID PANEL      Component Value Range   Cholesterol 215 (*) 0 - 200 mg/dL   Triglycerides 88  <308 mg/dL   HDL 44  >65 mg/dL   Total CHOL/HDL Ratio 4.9     VLDL 18  0 - 40 mg/dL   LDL Cholesterol 784 (*) 0 - 99 mg/dL    EKG Orders placed in visit on 04/29/12  . EKG 12-LEAD     Prior Assessment and Plan Problem List as of 04/29/2012          HYPERLIPIDEMIA   Morbid obesity   GERD   BACK PAIN, LUMBAR   TROCHANTERIC BURSITIS, RIGHT   Syncope and collapse   Chest pain   Hyperglycemia   Tachycardia   Diastolic dysfunction   Prediabetes   Hypertension       Imaging: Dg Chest 2 View  04/14/2012  *RADIOLOGY REPORT*  Clinical Data: Chest pain and shortness of breath.  CHEST - 2 VIEW  Comparison: 11/07/2009.  Findings: The cardiac silhouette remains borderline enlarged.  The pulmonary vasculature and interstitial markings are mildly prominent with no pleural fluid seen.  Thoracic spine degenerative changes.  IMPRESSION:  1.  Borderline cardiomegaly and mild pulmonary vascular congestion. 2.  Mild chronic interstitial lung disease with possible minimal superimposed interstitial pulmonary edema.   Original Report Authenticated By: Beckie Salts, M.D.    Ct Head Wo Contrast  04/14/2012  *RADIOLOGY REPORT*  Clinical Data: Loss of consciousness.  CT HEAD WITHOUT CONTRAST  Technique:  Contiguous axial  images were obtained from the base of the skull through the vertex without contrast.  Comparison: None.  Findings: Bilateral hyperostosis frontalis and bilateral dural ossification.  Normal appearing cerebral hemispheres and posterior fossa structures.  Normal size and position of the ventricles.  No intracranial hemorrhage, mass lesion or CT evidence of acute infarction.  No fractures or paranasal sinus air-fluid levels.  IMPRESSION: No acute abnormality.   Original Report Authenticated By: Beckie Salts, M.D.    Ct Angio Chest W/cm &/or Wo Cm  04/14/2012  *RADIOLOGY REPORT*  Clinical Data: Chest pain, shortness of breath, tachycardia  CT ANGIOGRAPHY CHEST  Technique:  Multidetector CT imaging of the chest using the standard protocol during bolus administration of intravenous contrast. Multiplanar reconstructed images including MIPs were obtained and reviewed to evaluate the vascular anatomy.  Contrast: OMNIPAQUE IOHEXOL 350 MG/ML SOLN  Comparison: Chest radiographs dated 04/14/2012  Findings: Suboptimal contrast opacification secondary to body habitus/bolus timing.  No evidence of pulmonary embolism.  Lungs are essentially clear.  No suspicious pulmonary nodules.  No pleural effusion or pneumothorax.  Visualized thyroid is unremarkable.  The heart is normal in size. No pericardial effusion.  No suspicious mediastinal, hilar, or axillary lymphadenopathy.  Visualized upper abdomen is notable for moderate hepatic steatosis.  Degenerative changes of the visualized thoracolumbar spine.  IMPRESSION: No evidence of pulmonary embolism.  No evidence of acute cardiopulmonary disease.   Original Report Authenticated By: Charline Bills, M.D.    US Carotid Duplex Bilateral  04/14/2012  *RADIOLOGY REPORT*  Clinical Data: Syncope  BILATERAL CAROTID DUPLEX ULTRASOUND  Technique: Wallace Cullens scale imaging, color Doppler and duplex ultrasound was performed of bilateral carotid and vertebral arteries in the neck.  Comparison:   Head CT performed concurrently on 04/14/2012  Criteria:  Quantification of carotid stenosis is based on velocity parameters that correlate the residual internal carotid diameter with NASCET-based stenosis levels, using the diameter of the distal internal carotid lumen as the denominator for stenosis measurement.  The following velocity measurements were obtained:                   PEAK SYSTOLIC/END DIASTOLIC RIGHT ICA:                        75/24cm/sec CCA:                        121/9cm/sec SYSTOLIC ICA/CCA RATIO:     0.6 DIASTOLIC ICA/CCA RATIO:    2.8 ECA:                        112cm/sec  LEFT ICA:                        75/14cm/sec CCA:                        122/16cm/sec SYSTOLIC ICA/CCA RATIO:     0.6 DIASTOLIC ICA/CCA RATIO:    0.9 ECA:                        71cm/sec  Findings:  RIGHT CAROTID ARTERY: Mild intimal medial thickening without significant focal atherosclerotic plaque or stenosis by gray scale, color Doppler or spectral waveform analysis.  RIGHT VERTEBRAL ARTERY:  Patent with antegrade flow  LEFT CAROTID ARTERY: Mild intimal medial thickening without significant focal atherosclerotic plaque or stenosis by gray scale, color Doppler spectral waveform analysis.  LEFT VERTEBRAL ARTERY:  Patent with normal antegrade flow  IMPRESSION:  1.  Mildly limited examination secondary to patient body habitus.  2.  Mild intimal medial  thickening in the bilateral common carotid arteries without significant focal atherosclerotic plaque or stenosis.  3.  Bilateral vertebral arteries are patent with antegrade flow.  Signed,  Sterling Big, MD Vascular & Interventional Radiologist [facility]   Original Report Authenticated By: Malachy Moan, M.D.      [facility] Calculation: Score not calculated. Missing: Total Cholesterol

## 2012-04-29 NOTE — Progress Notes (Signed)
   HPI: Tracey [name] is a morbidly obese (4007 lb) patient of Dr. Tenny Craw we are seeing on hospital follow up for ongoing assessment and treatment of chest pain and vagal syncope. She was treated with IV fluids while hospitalized, recommended for nuclear stress test, but due to weight limitations, she was unable to have the test. She was also diagnosed with pre-diabetes. She continues to have exertional chest pain, chronic DOE, and leg pain. She states that she uses a cane to walk due to her weight and pain in legs. She has not seen Dr.Simpson, her PCP in over 9 years.   Allergies  Allergen Reactions  . Haloperidol Lactate Anaphylaxis    Current Outpatient Prescriptions  Medication Sig Dispense Refill  . atorvastatin (LIPITOR) 20 MG tablet Take 1 tablet (20 mg total) by mouth daily at 6 PM. For treatment of high cholesterol.  30 tablet  3  . metoprolol succinate (TOPROL-XL) 25 MG 24 hr tablet Take 1 tablet (25 mg total) by mouth daily. For treatment of high blood pressure.  30 tablet  3  . omeprazole (PRILOSEC) 20 MG capsule Take 1 capsule (20 mg total) by mouth daily. 4 acid reflux.  30 capsule  3    Past Medical History  Diagnosis Date  . PE (pulmonary embolism)   . Diastolic dysfunction 10/2009    Grade 1  . Morbid obesity   . Hyperlipidemia   . Pulmonary embolism   . GERD (gastroesophageal reflux disease)   . Hyperlipidemia   . Anginal pain   . Arthritis   . Prediabetes 04/15/2012  . Hypertension 04/15/2012    Past Surgical History  Procedure Date  . Cesarean section   . Abdominal hysterectomy     [id number] of systems complete and found to be negative unless listed above  PHYSICAL EXAM BP 116/80  Pulse 87  Ht 5\' 10"  (1.778 m)  Wt 404 lb 1.3 oz (183.289 kg)  BMI 57.98 kg/m2  SpO2 97%  General: Well developed, well nourished, in no acute distress, morbidly obese Head: Eyes PERRLA, No xanthomas.   Normal cephalic and atramatic  Lungs: Clear bilaterally to auscultation and  percussion. Heart: HRRR S1 S2, distant heart sounds, without MRG.  Pulses are 2+ & equal.            No carotid bruit. Neck obese, No JVD.  No abdominal bruits. No femoral bruits. Abdomen: Bowel sounds are positive, abdomen soft and non-tender without masses or                  Hernia's noted. Severely obese abdomen. Msk:  Back normal, slow, lumbering  gait. Normal strength and tone for age. Extremities: No clubbing, cyanosis or edema.  DP +1 Neuro: Alert and oriented X 3. Psych:  Good affect, responds appropriately  EKG: NSR rate of 89 bpm  ASSESSMENT AND PLAN

## 2012-05-20 ENCOUNTER — Other Ambulatory Visit (HOSPITAL_COMMUNITY): Payer: BC Managed Care – PPO

## 2012-05-24 ENCOUNTER — Telehealth: Payer: Self-pay | Admitting: *Deleted

## 2012-05-24 NOTE — Telephone Encounter (Signed)
Stress echo scheduled for 05-30-12

## 2012-05-30 ENCOUNTER — Encounter: Payer: Self-pay | Admitting: Adult Health

## 2012-05-30 ENCOUNTER — Ambulatory Visit (HOSPITAL_COMMUNITY): Payer: BC Managed Care – PPO | Attending: Adult Health | Admitting: Radiology

## 2012-05-30 DIAGNOSIS — I10 Essential (primary) hypertension: Secondary | ICD-10-CM | POA: Insufficient documentation

## 2012-05-30 DIAGNOSIS — R55 Syncope and collapse: Secondary | ICD-10-CM | POA: Insufficient documentation

## 2012-05-30 DIAGNOSIS — K219 Gastro-esophageal reflux disease without esophagitis: Secondary | ICD-10-CM | POA: Insufficient documentation

## 2012-05-30 DIAGNOSIS — R0789 Other chest pain: Secondary | ICD-10-CM

## 2012-05-30 DIAGNOSIS — R0609 Other forms of dyspnea: Secondary | ICD-10-CM | POA: Insufficient documentation

## 2012-05-30 DIAGNOSIS — R072 Precordial pain: Secondary | ICD-10-CM | POA: Insufficient documentation

## 2012-05-30 DIAGNOSIS — R7309 Other abnormal glucose: Secondary | ICD-10-CM | POA: Insufficient documentation

## 2012-05-30 DIAGNOSIS — R0989 Other specified symptoms and signs involving the circulatory and respiratory systems: Secondary | ICD-10-CM | POA: Insufficient documentation

## 2012-05-30 DIAGNOSIS — R0602 Shortness of breath: Secondary | ICD-10-CM | POA: Insufficient documentation

## 2012-05-30 DIAGNOSIS — Z86711 Personal history of pulmonary embolism: Secondary | ICD-10-CM | POA: Insufficient documentation

## 2012-05-30 DIAGNOSIS — R000 Tachycardia, unspecified: Secondary | ICD-10-CM | POA: Insufficient documentation

## 2012-05-30 NOTE — Progress Notes (Signed)
Echocardiogram performed.  

## 2012-06-03 ENCOUNTER — Encounter: Payer: Self-pay | Admitting: Internal Medicine

## 2012-06-03 ENCOUNTER — Ambulatory Visit (INDEPENDENT_AMBULATORY_CARE_PROVIDER_SITE_OTHER): Payer: BC Managed Care – PPO | Admitting: Internal Medicine

## 2012-06-03 VITALS — BP 144/88 | HR 84 | Ht 70.0 in | Wt >= 6400 oz

## 2012-06-03 DIAGNOSIS — R1310 Dysphagia, unspecified: Secondary | ICD-10-CM

## 2012-06-03 MED ORDER — AMLODIPINE BESYLATE 2.5 MG PO TABS: 2.5000 mg | ORAL_TABLET | Freq: Every day | ORAL | Status: DC

## 2012-06-03 NOTE — Progress Notes (Signed)
HPI Tracey [name] is a morbidly obese (4007 lb) patient with history of CP  She was hosp and r/o for MI  Had CP, exertional SOB, DOE, leg pain.  Post hosp seen by Harriet Pho  Dobut echo scheduled. Stress echo was done (dobutamine) that was normal.    Since seen she is still having CP  Episodes occur a couple times per day  Last seconds.  Substernal  Had on spell that lasted 3 min  Radiated to R arm and back.  Not pleuritic  No positional  No associated with activity.  Notes occasional reflux.  Also says food gets stuck often  No GI eval Seen by Dr/ Lodema Hong in past.  Allergies  Allergen Reactions  . Haloperidol Lactate Anaphylaxis    Current Outpatient Prescriptions  Medication Sig Dispense Refill  . atorvastatin (LIPITOR) 20 MG tablet Take 1 tablet (20 mg total) by mouth daily at 6 PM. For treatment of high cholesterol.  30 tablet  3  . metoprolol succinate (TOPROL-XL) 25 MG 24 hr tablet Take 1 tablet (25 mg total) by mouth daily. For treatment of high blood pressure.  30 tablet  3  . omeprazole (PRILOSEC) 20 MG capsule Take 1 capsule (20 mg total) by mouth daily. 4 acid reflux.  30 capsule  3   No current facility-administered medications for this visit.    Past Medical History  Diagnosis Date  . PE (pulmonary embolism)   . Diastolic dysfunction 10/2009    Grade 1  . Morbid obesity   . Hyperlipidemia   . Pulmonary embolism   . GERD (gastroesophageal reflux disease)   . Hyperlipidemia   . Anginal pain   . Arthritis   . Prediabetes 04/15/2012  . Hypertension 04/15/2012    Past Surgical History  Procedure Laterality Date  . Cesarean section    . Abdominal hysterectomy      No family history on file.  History   Social History  . Marital Status: Single    Spouse Name: N/A    Number of Children: N/A  . Years of Education: N/A   Occupational History  . Not on file.   Social History Main Topics  . Smoking status: Never Smoker   . Smokeless tobacco: Not on file  .  Alcohol Use: No  . Drug Use: No  . Sexually Active: Not Currently   Other Topics Concern  . Not on file   Social History Narrative  . No narrative on file    Review of Systems:  All systems reviewed.  They are negative to the above problem except as previously stated.  Vital Signs: BP 144/88  Pulse 84  Ht 5\' 10"  (1.778 m)  Wt 410 lb 4 oz (186.088 kg)  BMI 58.86 kg/m2  Physical Exam Morbidly obese 57 yo  HEENT:  Normocephalic, atraumatic. EOMI, PERRLA.  Neck: JVP is normal.  No bruits.  Lungs: clear to auscultation. No rales no wheezes.  Heart: Regular rate and rhythm. Normal S1, S2. No S3.   No significant murmurs. PMI not displaced.  Abdomen:  Supple, nontender. Normal bowel sounds. No masses. No hepatomegaly.  Extremities:   Good distal pulses throughout. 1+ lower extremity edema.  Musculoskeletal :moving all extremities.  Neuro:   alert and oriented x3.  CN II-XII grossly intact.   Assessment and Plan: 1.  CP  Atypical  Negatvie dobutamine echo.  Question GI  Will get barium swallow Switch toprol to amlodipine 2.5  Follow  2  HTN  As above.  3.  HL  Keep on lipitor  4.  Obesity.  Counselled  Send to dietary  Will set patient up to be seen by IM  Return in 2 months.

## 2012-06-03 NOTE — Patient Instructions (Signed)
Your physician recommends that you schedule a follow-up appointment in:  1 - 2 months with Dr Tenny Craw 2 - Call Dr Lodema Hong to make an appointment  Barium Swallowing test  Your physician has recommended you make the following change in your medication:  1 - FINISH Metoprolol 2 - START Amlodipine 2.5 mg daily after you finish metoprolol

## 2012-06-07 ENCOUNTER — Other Ambulatory Visit (HOSPITAL_COMMUNITY): Payer: BC Managed Care – PPO

## 2012-06-13 ENCOUNTER — Other Ambulatory Visit (HOSPITAL_COMMUNITY): Payer: BC Managed Care – PPO

## 2012-08-01 ENCOUNTER — Telehealth: Payer: Self-pay | Admitting: Internal Medicine

## 2012-08-01 MED ORDER — AMLODIPINE BESYLATE 2.5 MG PO TABS: 2.5000 mg | ORAL_TABLET | Freq: Every day | ORAL | Status: DC

## 2012-08-01 NOTE — Telephone Encounter (Signed)
Patient states that she was to start taking Amlodopine after finishing Metoprolol.  States that she is finished with the Metoprolol but the Amlodopine has not been called in to pharmacy yet. / tgs

## 2012-08-01 NOTE — Telephone Encounter (Signed)
rx sent to pharmacy by e-script Left pt vm to advise the rx has been sent in for her

## 2012-08-02 ENCOUNTER — Telehealth: Payer: Self-pay | Admitting: *Deleted

## 2012-08-02 NOTE — Telephone Encounter (Signed)
Spoke to pt to advise results/instructions. Pt understood. Cancelled pt apt

## 2012-08-02 NOTE — Telephone Encounter (Signed)
Pt called into advise that she is now swallowing "alot" better and decided to cancel the swallowing test that DR Tenny Craw advised her to have and wanted to know if it is ok with Dr Tenny Craw that she cancel her upcoming f/u apt based on her improved condition, advised pt that I will inform Dr Tenny Craw of her decision and of any further instruction Dr Tenny Craw may have, pt understood

## 2012-08-02 NOTE — Telephone Encounter (Signed)
Glad she is feeling better.  OK to cancel f/u

## 2012-08-08 ENCOUNTER — Ambulatory Visit: Payer: BC Managed Care – PPO | Admitting: Internal Medicine

## 2013-01-03 ENCOUNTER — Encounter: Payer: Self-pay | Admitting: *Deleted

## 2013-01-06 ENCOUNTER — Other Ambulatory Visit (HOSPITAL_COMMUNITY): Payer: Self-pay | Admitting: *Deleted

## 2013-01-06 ENCOUNTER — Ambulatory Visit (HOSPITAL_COMMUNITY)
Admission: RE | Admit: 2013-01-06 | Discharge: 2013-01-06 | Disposition: A | Discharge status: Home / Self Care | Payer: BC Managed Care – PPO | Source: Ambulatory Visit | Attending: Adult Health | Admitting: Adult Health

## 2013-01-06 DIAGNOSIS — M5137 Other intervertebral disc degeneration, lumbosacral region: Secondary | ICD-10-CM | POA: Insufficient documentation

## 2013-01-06 DIAGNOSIS — M545 Low back pain, unspecified: Secondary | ICD-10-CM | POA: Insufficient documentation

## 2013-01-06 DIAGNOSIS — IMO0002 Reserved for concepts with insufficient information to code with codable children: Secondary | ICD-10-CM | POA: Insufficient documentation

## 2013-01-06 DIAGNOSIS — M51379 Other intervertebral disc degeneration, lumbosacral region without mention of lumbar back pain or lower extremity pain: Secondary | ICD-10-CM | POA: Insufficient documentation

## 2013-01-06 DIAGNOSIS — M1710 Unilateral primary osteoarthritis, unspecified knee: Secondary | ICD-10-CM | POA: Insufficient documentation

## 2013-01-06 DIAGNOSIS — M25569 Pain in unspecified knee: Secondary | ICD-10-CM | POA: Insufficient documentation

## 2013-05-18 ENCOUNTER — Telehealth: Payer: Self-pay | Admitting: *Deleted

## 2013-05-18 NOTE — Telephone Encounter (Signed)
Pt needs her medications called in   Metoprolol, ometrazole, atorvastatin  [employer]

## 2013-05-18 NOTE — Telephone Encounter (Signed)
Please review phone note conversation about pt last OV to be cancelled, pt now requesting medication refills please advise

## 2013-05-19 MED ORDER — ATORVASTATIN CALCIUM 20 MG PO TABS: 20.0000 mg | ORAL_TABLET | Freq: Every day | ORAL | Status: DC

## 2013-05-19 MED ORDER — AMLODIPINE BESYLATE 2.5 MG PO TABS
2.5000 mg | ORAL_TABLET | Freq: Every day | ORAL | Status: DC
Start: 2013-05-19 — End: 2014-05-11

## 2013-05-19 MED ORDER — OMEPRAZOLE 20 MG PO CPDR: 20.0000 mg | DELAYED_RELEASE_CAPSULE | Freq: Every day | ORAL | Status: DC

## 2013-05-19 NOTE — Telephone Encounter (Signed)
2 refills given. Please make appointment for pt. Sent to [facility]

## 2013-05-19 NOTE — Telephone Encounter (Signed)
Give refills for 35months  She needs f/u since it has been 1 year.

## 2013-06-21 ENCOUNTER — Emergency Department (HOSPITAL_COMMUNITY): Payer: BC Managed Care – PPO

## 2013-06-21 ENCOUNTER — Encounter (HOSPITAL_COMMUNITY): Payer: Self-pay | Admitting: Emergency Medicine

## 2013-06-21 ENCOUNTER — Emergency Department (HOSPITAL_COMMUNITY)
Admission: EM | Admit: 2013-06-21 | Discharge: 2013-06-22 | Disposition: A | Discharge status: Home / Self Care | Payer: BC Managed Care – PPO | Attending: Emergency Medicine | Admitting: Emergency Medicine

## 2013-06-21 DIAGNOSIS — R197 Diarrhea, unspecified: Secondary | ICD-10-CM | POA: Insufficient documentation

## 2013-06-21 DIAGNOSIS — Z8739 Personal history of other diseases of the musculoskeletal system and connective tissue: Secondary | ICD-10-CM | POA: Insufficient documentation

## 2013-06-21 DIAGNOSIS — R0602 Shortness of breath: Secondary | ICD-10-CM | POA: Insufficient documentation

## 2013-06-21 DIAGNOSIS — Z90710 Acquired absence of both cervix and uterus: Secondary | ICD-10-CM | POA: Insufficient documentation

## 2013-06-21 DIAGNOSIS — K219 Gastro-esophageal reflux disease without esophagitis: Secondary | ICD-10-CM | POA: Insufficient documentation

## 2013-06-21 DIAGNOSIS — E785 Hyperlipidemia, unspecified: Secondary | ICD-10-CM | POA: Insufficient documentation

## 2013-06-21 DIAGNOSIS — I10 Essential (primary) hypertension: Secondary | ICD-10-CM | POA: Insufficient documentation

## 2013-06-21 DIAGNOSIS — R109 Unspecified abdominal pain: Secondary | ICD-10-CM

## 2013-06-21 DIAGNOSIS — Z86711 Personal history of pulmonary embolism: Secondary | ICD-10-CM | POA: Insufficient documentation

## 2013-06-21 DIAGNOSIS — E860 Dehydration: Secondary | ICD-10-CM

## 2013-06-21 DIAGNOSIS — N390 Urinary tract infection, site not specified: Secondary | ICD-10-CM | POA: Insufficient documentation

## 2013-06-21 DIAGNOSIS — Z79899 Other long term (current) drug therapy: Secondary | ICD-10-CM | POA: Insufficient documentation

## 2013-06-21 DIAGNOSIS — I209 Angina pectoris, unspecified: Secondary | ICD-10-CM | POA: Insufficient documentation

## 2013-06-21 LAB — URINALYSIS, ROUTINE W REFLEX MICROSCOPIC
BILIRUBIN URINE: NEGATIVE
Glucose, UA: NEGATIVE mg/dL
KETONES UR: NEGATIVE mg/dL
NITRITE: NEGATIVE
PROTEIN: NEGATIVE mg/dL
SPECIFIC GRAVITY, URINE: 1.01 (ref 1.005–1.030)
UROBILINOGEN UA: 0.2 mg/dL (ref 0.0–1.0)
pH: 5.5 (ref 5.0–8.0)

## 2013-06-21 LAB — CBC WITH DIFFERENTIAL/PLATELET
BASOS ABS: 0 10*3/uL (ref 0.0–0.1)
BASOS PCT: 0 % (ref 0–1)
EOS ABS: 0.1 10*3/uL (ref 0.0–0.7)
Eosinophils Relative: 1 % (ref 0–5)
HCT: 42.3 % (ref 36.0–46.0)
Hemoglobin: 13.8 g/dL (ref 12.0–15.0)
Lymphocytes Relative: 26 % (ref 12–46)
Lymphs Abs: 2.7 10*3/uL (ref 0.7–4.0)
MCH: 23.3 pg — AB (ref 26.0–34.0)
MCHC: 32.6 g/dL (ref 30.0–36.0)
MCV: 71.5 fL — ABNORMAL LOW (ref 78.0–100.0)
Monocytes Absolute: 0.5 10*3/uL (ref 0.1–1.0)
Monocytes Relative: 5 % (ref 3–12)
NEUTROS PCT: 68 % (ref 43–77)
Neutro Abs: 7 10*3/uL (ref 1.7–7.7)
PLATELETS: 266 10*3/uL (ref 150–400)
RBC: 5.92 MIL/uL — AB (ref 3.87–5.11)
RDW: 16.7 % — AB (ref 11.5–15.5)
WBC: 10.4 10*3/uL (ref 4.0–10.5)

## 2013-06-21 LAB — URINE MICROSCOPIC-ADD ON

## 2013-06-21 LAB — COMPREHENSIVE METABOLIC PANEL
ALBUMIN: 3.8 g/dL (ref 3.5–5.2)
ALK PHOS: 102 U/L (ref 39–117)
ALT: 23 U/L (ref 0–35)
AST: 20 U/L (ref 0–37)
BUN: 10 mg/dL (ref 6–23)
CALCIUM: 9.2 mg/dL (ref 8.4–10.5)
CO2: 25 mEq/L (ref 19–32)
Chloride: 100 mEq/L (ref 96–112)
Creatinine, Ser: 0.63 mg/dL (ref 0.50–1.10)
GFR calc Af Amer: >90 mL/min (ref >90)
GFR calc non Af Amer: >90 mL/min (ref >90)
Glucose, Bld: 207 mg/dL — ABNORMAL HIGH (ref 70–99)
POTASSIUM: 4.1 meq/L (ref 3.7–5.3)
SODIUM: 139 meq/L (ref 137–147)
TOTAL PROTEIN: 8.4 g/dL — AB (ref 6.0–8.3)
Total Bilirubin: 0.5 mg/dL (ref 0.3–1.2)

## 2013-06-21 LAB — LIPASE, BLOOD: Lipase: 19 U/L (ref 11–59)

## 2013-06-21 LAB — LACTIC ACID, PLASMA
LACTIC ACID, VENOUS: 1.3 mmol/L (ref 0.5–2.2)
Lactic Acid, Venous: 2.9 mmol/L — ABNORMAL HIGH (ref 0.5–2.2)

## 2013-06-21 MED ORDER — OXYCODONE-ACETAMINOPHEN 5-325 MG PO TABS: 1.0000 | ORAL_TABLET | ORAL | Status: DC | PRN

## 2013-06-21 MED ORDER — IOHEXOL 300 MG/ML  SOLN
50.0000 mL | Freq: Once | INTRAMUSCULAR | Status: AC | PRN
  Administered 2013-06-21: 50 mL via ORAL

## 2013-06-21 MED ORDER — SODIUM CHLORIDE 0.9 % IV BOLUS (SEPSIS)
500.0000 mL | Freq: Once | INTRAVENOUS | Status: AC
  Administered 2013-06-21: 500 mL via INTRAVENOUS

## 2013-06-21 MED ORDER — DEXTROSE 5 % IV SOLN
1.0000 g | Freq: Once | INTRAVENOUS | Status: AC
  Administered 2013-06-21: 1 g via INTRAVENOUS
  Filled 2013-06-21: qty 10

## 2013-06-21 MED ORDER — SODIUM CHLORIDE 0.9 % IJ SOLN
INTRAMUSCULAR | Status: AC
  Filled 2013-06-21: qty 600

## 2013-06-21 MED ORDER — SODIUM CHLORIDE 0.9 % IV BOLUS (SEPSIS)
1000.0000 mL | Freq: Once | INTRAVENOUS | Status: AC
  Administered 2013-06-21: 1000 mL via INTRAVENOUS

## 2013-06-21 MED ORDER — HYDROMORPHONE HCL PF 1 MG/ML IJ SOLN
1.0000 mg | Freq: Once | INTRAMUSCULAR | Status: AC
Start: 2013-06-21 — End: 2013-06-21
  Administered 2013-06-21: 1 mg via INTRAVENOUS
  Filled 2013-06-21: qty 1

## 2013-06-21 MED ORDER — IOHEXOL 300 MG/ML  SOLN
100.0000 mL | Freq: Once | INTRAMUSCULAR | Status: AC | PRN
  Administered 2013-06-21: 100 mL via INTRAVENOUS

## 2013-06-21 MED ORDER — ONDANSETRON HCL 4 MG/2ML IJ SOLN
4.0000 mg | Freq: Once | INTRAMUSCULAR | Status: AC
  Administered 2013-06-21: 4 mg via INTRAVENOUS

## 2013-06-21 MED ORDER — ONDANSETRON HCL 4 MG/2ML IJ SOLN
INTRAMUSCULAR | Status: AC
  Filled 2013-06-21: qty 2

## 2013-06-21 MED ORDER — MORPHINE SULFATE 4 MG/ML IJ SOLN
8.0000 mg | Freq: Once | INTRAMUSCULAR | Status: AC
  Administered 2013-06-21: 8 mg via INTRAVENOUS
  Filled 2013-06-21: qty 2

## 2013-06-21 MED ORDER — CEPHALEXIN 500 MG PO CAPS: 500.0000 mg | ORAL_CAPSULE | Freq: Four times a day (QID) | ORAL | Status: DC

## 2013-06-21 NOTE — ED Notes (Signed)
Oral CT contrast finished.

## 2013-06-21 NOTE — ED Notes (Signed)
Pt c/o lower abdominal pain that radiates to back. Describes pain as sharp. Pain becomes worse with any movement. Pt also reports diarrhea. Denies vomiting.

## 2013-06-21 NOTE — ED Provider Notes (Signed)
CSN: [id number]     Arrival date & time 06/21/13  1640 History  This chart was scribed for Sharyon Cable, MD by Elby Beck, ED Scribe. This patient was seen in room APA03/APA03 and the patient's care was started at 6:39 PM.   Chief Complaint  Patient presents with  . Abdominal Pain    Patient is a 58 y.o. female presenting with abdominal pain. The history is provided by the patient. No language interpreter was used.  Abdominal Pain Pain location:  Generalized Pain quality: sharp   Pain radiates to:  Back Pain severity:  Severe Onset quality:  Gradual Duration:  4 days Timing:  Intermittent Progression:  Worsening Chronicity:  New Relieved by:  None tried Worsened by:  Movement Ineffective treatments:  None tried Associated symptoms: diarrhea and shortness of breath (at baseline, with no recent changes)   Associated symptoms: no chest pain, no cough, no fever, no nausea and no vomiting     HPI Comments: Tracey [name] is a 58 y.o. female with a history of morbid obesity, PE and HTN who presents to the Emergency Department complaining of intermittent, sharp severe abdominal pain that radiates to her back over the past 4 days. She states that this pain has been gradually worsening. She states that this pain is worsened with any movement and with walking. She denies any prior history of similar abdominal pain. She states that she has been having associated episodes of non-bloody diarrhea over the past few days. She reports that she has some SOB at baseline with no recent changes. She reports having a history of abdominal hysterectomy, and states that she has had gall stones removed, but she denies any other surgical abdominal history. She state states that she is not on any blood thinning mediations. She denies fever, nausea, vomiting, blood in stool, cough, chest pain or any other symptoms.   Past Medical History  Diagnosis Date  . PE (pulmonary embolism)   . Diastolic  dysfunction 07/1322    Grade 1  . Morbid obesity   . Hyperlipidemia   . Pulmonary embolism   . GERD (gastroesophageal reflux disease)   . Hyperlipidemia   . Anginal pain   . Arthritis   . Prediabetes 04/15/2012  . Hypertension 04/15/2012   Past Surgical History  Procedure Laterality Date  . Cesarean section    . Abdominal hysterectomy     History reviewed. No pertinent family history. History  Substance Use Topics  . Smoking status: Never Smoker   . Smokeless tobacco: Not on file  . Alcohol Use: No   OB History   Grav Para Term Preterm Abortions TAB SAB Ect Mult Living                 Review of Systems  Constitutional: Negative for fever.  Respiratory: Positive for shortness of breath (at baseline, with no recent changes). Negative for cough.   Cardiovascular: Negative for chest pain.  Gastrointestinal: Positive for abdominal pain and diarrhea. Negative for nausea, vomiting and blood in stool.  All other systems reviewed and are negative.   Allergies  Haloperidol lactate  Home Medications   Current Outpatient Rx  Name  Route  Sig  Dispense  Refill  . amLODipine (NORVASC) 2.5 MG tablet   Oral   Take 1 tablet (2.5 mg total) by mouth daily.   30 tablet   2   . atorvastatin (LIPITOR) 20 MG tablet   Oral   Take 1 tablet (20 mg total)  by mouth daily at 6 PM. For treatment of high cholesterol.   30 tablet   2   . omeprazole (PRILOSEC) 20 MG capsule   Oral   Take 1 capsule (20 mg total) by mouth daily. 4 acid reflux.   30 capsule   2    Triage Vitals: BP 175/77  Pulse 113  Temp(Src) 98.1 F (36.7 C) (Oral)  Resp 20  SpO2 99% BP 121/61  Pulse 73  Temp(Src) 97.8 F (36.6 C) (Oral)  Resp 16  Wt 412 lb 8 oz (187.109 kg)  SpO2 95%   Physical Exam  Nursing note and vitals reviewed. CONSTITUTIONAL: Well developed/well nourished HEAD: Normocephalic/atraumatic EYES: EOMI/PERRL ENMT: Mucous membranes moist NECK: supple no meningeal signs SPINE:entire  spine nontender CV: S1/S2 noted, no murmurs/rubs/gallops noted LUNGS: Lungs are clear to auscultation bilaterally, no apparent distress ABDOMEN: soft, morbidly obese which limits exam, moderate tenderness throughout lower abdomen, no abscess, cellulitis or hernia noted to abdominal wall GU:no cva tenderness NEURO: Pt is awake/alert, moves all extremitiesx4 EXTREMITIES: pulses normal, full ROM SKIN: warm, color normal PSYCH: no abnormalities of mood noted  ED Course  Procedures  DIAGNOSTIC STUDIES: Oxygen Saturation is 99% on RA, normal by my interpretation.    COORDINATION OF CARE: 6:45 PM- Discussed plan to obtain a CT along with diagnostic lab work. Will also order medications. Pt advised of plan for treatment and pt agrees.  9:16 PM CT NEGATIVE PT STILL WITH PAIN WILL REHYDRATE, TREAT PAIN, ?UTI AND WILL GIVE ROCEPHIN AND THEN REASSESS PT AGREEABLE WITH PLAN 11:50 PM Pt improved Resting comfortably Lactate improved Stable for d/c home  Medications  sodium chloride 0.9 % injection (not administered)  sodium chloride 0.9 % bolus 500 mL (500 mLs Intravenous New Bag/Given 06/21/13 2231)  morphine 4 MG/ML injection 8 mg (8 mg Intravenous Given 06/21/13 1914)  iohexol (OMNIPAQUE) 300 MG/ML solution 50 mL (50 mLs Oral Contrast Given 06/21/13 1901)  ondansetron (ZOFRAN) injection 4 mg (4 mg Intravenous Given 06/21/13 1921)  iohexol (OMNIPAQUE) 300 MG/ML solution 100 mL (100 mLs Intravenous Contrast Given 06/21/13 2041)  sodium chloride 0.9 % bolus 1,000 mL (0 mLs Intravenous Stopped 06/21/13 2231)  HYDROmorphone (DILAUDID) injection 1 mg (1 mg Intravenous Given 06/21/13 2129)  cefTRIAXone (ROCEPHIN) 1 g in dextrose 5 % 50 mL IVPB (0 g Intravenous Stopped 06/21/13 2208)   Labs Review Labs Reviewed  URINALYSIS, ROUTINE W REFLEX MICROSCOPIC - Abnormal; Notable for the following:    Hgb urine dipstick MODERATE (*)    Leukocytes, UA SMALL (*)    All other components within normal limits   CBC WITH DIFFERENTIAL - Abnormal; Notable for the following:    RBC 5.92 (*)    MCV 71.5 (*)    MCH 23.3 (*)    RDW 16.7 (*)    All other components within normal limits  COMPREHENSIVE METABOLIC PANEL - Abnormal; Notable for the following:    Glucose, Bld 207 (*)    Total Protein 8.4 (*)    All other components within normal limits  URINE MICROSCOPIC-ADD ON - Abnormal; Notable for the following:    Squamous Epithelial / LPF MANY (*)    Bacteria, UA MANY (*)    All other components within normal limits  LACTIC ACID, PLASMA - Abnormal; Notable for the following:    Lactic Acid, Venous 2.9 (*)    All other components within normal limits  URINE CULTURE  LIPASE, BLOOD  LACTIC ACID, PLASMA   Imaging Review Ct Abdomen  Pelvis W Contrast  06/21/2013   CLINICAL DATA Abdominal pain for several days.  EXAM CT ABDOMEN AND PELVIS WITH CONTRAST  TECHNIQUE Multidetector CT imaging of the abdomen and pelvis was performed using the standard protocol following bolus administration of intravenous contrast.  CONTRAST 49mL OMNIPAQUE IOHEXOL 300 MG/ML SOLN, 118mL OMNIPAQUE IOHEXOL 300 MG/ML SOLN  COMPARISON None.  FINDINGS Lung Bases: He tendon atelectasis.  Liver:  Fatty liver and hepatomegaly.  No focal mass lesions.  Spleen:  Normal.  Gallbladder:  Cholecystectomy.  Common bile duct:  Normal.  Pancreas:  Normal.  Adrenal glands:  Normal.  Kidneys:  Normal.  Stomach: Patulous gastroesophageal junction. Stomach appears normal.  Small bowel:  Normal.  Colon: Normal appendix. There are no inflammatory changes of colon. Distal colon is decompressed.  Pelvic Genitourinary: Urinary bladder normal. Hysterectomy. No free fluid.  Bones: Lumbar spondylosis. 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  Vasculature: Normal.  Body Wall: Diastasis of the rectus muscles with protuberant abdomen. Laxity of the anterior abdominal wall fascia. No hernia.  IMPRESSION No acute  abnormality. Hepatomegaly and hepatosteatosis. Cholecystectomy and hysterectomy.  SIGNATURE  Electronically Signed   By: Dereck Ligas M.D.   On: 06/21/2013 21:03      MDM   Final diagnoses:  Abdominal pain  UTI (lower urinary tract infection)  Dehydration  HTN (hypertension)    Nursing notes including past medical history and social history reviewed and considered in documentation Labs/vital reviewed and considered    I personally performed the services described in this documentation, which was scribed in my presence. The recorded information has been reviewed and is accurate.     Sharyon Cable, MD 06/21/13 2351

## 2013-06-23 LAB — URINE CULTURE: Colony Count: 50000

## 2013-11-17 ENCOUNTER — Ambulatory Visit (INDEPENDENT_AMBULATORY_CARE_PROVIDER_SITE_OTHER): Payer: BC Managed Care – PPO | Admitting: Internal Medicine

## 2013-11-17 ENCOUNTER — Encounter: Payer: Self-pay | Admitting: Internal Medicine

## 2013-11-17 VITALS — BP 126/84 | HR 106 | Ht 69.0 in | Wt 378.0 lb

## 2013-11-17 DIAGNOSIS — I159 Secondary hypertension, unspecified: Secondary | ICD-10-CM

## 2013-11-17 DIAGNOSIS — I158 Other secondary hypertension: Secondary | ICD-10-CM

## 2013-11-17 DIAGNOSIS — E785 Hyperlipidemia, unspecified: Secondary | ICD-10-CM

## 2013-11-17 LAB — CBC
HEMATOCRIT: 42.1 % (ref 36.0–46.0)
HEMOGLOBIN: 14 g/dL (ref 12.0–15.0)
MCH: 22.7 pg — ABNORMAL LOW (ref 26.0–34.0)
MCHC: 33.3 g/dL (ref 30.0–36.0)
MCV: 68.3 fL — AB (ref 78.0–100.0)
Platelets: 331 10*3/uL (ref 150–400)
RBC: 6.16 MIL/uL — ABNORMAL HIGH (ref 3.87–5.11)
RDW: 17.4 % — ABNORMAL HIGH (ref 11.5–15.5)
WBC: 11.9 10*3/uL — AB (ref 4.0–10.5)

## 2013-11-17 NOTE — Patient Instructions (Signed)
Your physician recommends that you schedule a follow-up appointment in: To Be Determined  Your physician recommends that you return for lab work today. BMET, CBC, BNP, TSH, LIPIDS.  FASTING  Please call to schedule an appointment with a primary care physicain.  [place]  Address: [address] [place], [place] [address]  [phone]  Or  Dr Anastasio Champion - [phone]

## 2013-11-17 NOTE — Progress Notes (Signed)
HPI Patient is a 58 yo who has no known CAD  I saw her back in Feb 2014 She had an echo and stress echo that were normal. She does not have a primary care provider  Used to see Dr Moshe Cipro.  She says she has not been feeling good  Weak  Passed out 1 year ago  Now just weak on feet  Needs to be helped  [place] back lst wk  Legs just gave out.   Gets SOB with activity  Allergies  Allergen Reactions  . Haloperidol Lactate Anaphylaxis    Current Outpatient Prescriptions  Medication Sig Dispense Refill  . amLODipine (NORVASC) 2.5 MG tablet Take 1 tablet (2.5 mg total) by mouth daily.  30 tablet  2  . atorvastatin (LIPITOR) 20 MG tablet Take 1 tablet (20 mg total) by mouth daily at 6 PM. For treatment of high cholesterol.  30 tablet  2  . ibuprofen (ADVIL,MOTRIN) 200 MG tablet Take 200 mg by mouth as needed.      Marland Kitchen omeprazole (PRILOSEC) 20 MG capsule Take 1 capsule (20 mg total) by mouth daily. 4 acid reflux.  30 capsule  2   No current facility-administered medications for this visit.    Past Medical History  Diagnosis Date  . PE (pulmonary embolism)   . Diastolic dysfunction 05/6376    Grade 1  . Morbid obesity   . Hyperlipidemia   . Pulmonary embolism   . GERD (gastroesophageal reflux disease)   . Hyperlipidemia   . Anginal pain   . Arthritis   . Prediabetes 04/15/2012  . Hypertension 04/15/2012    Past Surgical History  Procedure Laterality Date  . Cesarean section    . Abdominal hysterectomy      No family history on file.  History   Social History  . Marital Status: Single    Spouse Name: N/A    Number of Children: N/A  . Years of Education: N/A   Occupational History  . Not on file.   Social History Main Topics  . Smoking status: Never Smoker   . Smokeless tobacco: Not on file  . Alcohol Use: No  . Drug Use: No  . Sexual Activity: Not Currently   Other Topics Concern  . Not on file   Social History Narrative  . No narrative on file    Review of Systems:   All systems reviewed.  They are negative to the above problem except as previously stated.  Vital Signs: BP 126/84  Pulse 106  Ht 5' 9"  (1.753 m)  Wt 378 lb (171.46 kg)  BMI 55.80 kg/m2  Physical Exam Morbidly obese 57 yo in NAD  HEENT:  Normocephalic, atraumatic. EOMI, PERRLA.  Neck: JVP is diffiuclt to assess  No bruits.  Lungs: clear to auscultation. No rales no wheezes.  Heart: Regular rate and rhythm. Normal S1, S2. No S3.   No significant murmurs. PMI not displaced.  Abdomen:  Supple, nontender. Normal bowel sounds. No masses. No hepatomegaly.  Extremities:   Good distal pulses throughout. Tr lower extremity edema.  Musculoskeletal :moving all extremities.  Neuro:   alert and oriented x3.  CN II-XII grossly intact.   Assessment and Plan:   HPI Mrs. Rupnow is a morbidly obese patient with history of CP  I saw her in clinic in 2014.  Dobutamine echo normal. I am seein her for She was hosp and r/o for MI  Had CP, exertional SOB, DOE, leg pain.  Post hosp seen  by Arnold Long  Dobut echo scheduled. Stress echo was done (dobutamine) that was normal.   SHe is now very weak, legs giving out.   I would recomm checking labs (CBC, BMET, BNP, TSH)  I am not convinced primary cardiac.  She is very large  Needs to lose wt.  2.  HL  Keep on lipitor  She has not eatten.  Check labs    3,  Weakness  Labs today  Refer to PT to objectify  Needs to lose wt.    Needs to be followed in primary care.

## 2013-11-18 LAB — BRAIN NATRIURETIC PEPTIDE: Brain Natriuretic Peptide: <2 pg/mL (ref 0.0–100.0)

## 2013-11-18 LAB — BASIC METABOLIC PANEL
BUN: 12 mg/dL (ref 6–23)
CO2: 26 mEq/L (ref 19–32)
Calcium: 9.1 mg/dL (ref 8.4–10.5)
Chloride: 98 mEq/L (ref 96–112)
Creat: 0.76 mg/dL (ref 0.50–1.10)
Glucose, Bld: 200 mg/dL — ABNORMAL HIGH (ref 70–99)
POTASSIUM: 4.3 meq/L (ref 3.5–5.3)
SODIUM: 136 meq/L (ref 135–145)

## 2013-11-18 LAB — TSH: TSH: 1.382 u[IU]/mL (ref 0.350–4.500)

## 2013-11-18 LAB — LIPID PANEL
CHOL/HDL RATIO: 4.1 ratio
CHOLESTEROL: 168 mg/dL (ref 0–200)
HDL: 41 mg/dL (ref >39)
LDL Cholesterol: 102 mg/dL — ABNORMAL HIGH (ref 0–99)
TRIGLYCERIDES: 126 mg/dL (ref <150)
VLDL: 25 mg/dL (ref 0–40)

## 2013-11-21 ENCOUNTER — Telehealth: Payer: Self-pay | Admitting: *Deleted

## 2013-11-21 DIAGNOSIS — R531 Weakness: Secondary | ICD-10-CM

## 2013-11-21 NOTE — Telephone Encounter (Signed)
Message copied by Truett Mainland on Tue Nov 21, 2013 10:28 AM ------      Message from: [name] V      Created: Tue Nov 21, 2013 10:04 AM       Glucose increased at 200  Otherwise electrolytes are normal  Thyroid function normal      Lipids are OK      Patient not anemic       Nothing explains weakness      Patient needs primary care physician  Would refer to PT ------

## 2013-11-21 NOTE — Telephone Encounter (Signed)
Put referral in. Pt made aware.

## 2014-05-11 ENCOUNTER — Other Ambulatory Visit: Payer: Self-pay | Admitting: Internal Medicine

## 2015-01-15 ENCOUNTER — Ambulatory Visit: Payer: Medicaid Other | Admitting: Family Medicine

## 2015-02-21 ENCOUNTER — Telehealth: Payer: Self-pay | Admitting: Internal Medicine

## 2015-02-21 NOTE — Telephone Encounter (Signed)
Application For Disability Parking Pla-Card faxed in gave to [employer]

## 2015-03-11 NOTE — Telephone Encounter (Signed)
Dr Harrington Challenger signed Disability Parking Placard--returned to medical records to be faxed to [place]/returned to pt

## 2015-03-13 ENCOUNTER — Telehealth: Payer: Self-pay | Admitting: Internal Medicine

## 2015-03-13 NOTE — Telephone Encounter (Signed)
Called patient not able to leave message on phone. I have her Disability Parking Pla-Card paper ready for pick up.

## 2015-04-01 ENCOUNTER — Ambulatory Visit (INDEPENDENT_AMBULATORY_CARE_PROVIDER_SITE_OTHER): Payer: Medicare Other | Admitting: Family Medicine

## 2015-04-01 ENCOUNTER — Encounter: Payer: Self-pay | Admitting: Family Medicine

## 2015-04-01 VITALS — BP 140/82 | HR 110 | Resp 18 | Ht 69.75 in | Wt 390.0 lb

## 2015-04-01 DIAGNOSIS — Z1159 Encounter for screening for other viral diseases: Secondary | ICD-10-CM

## 2015-04-01 DIAGNOSIS — Z23 Encounter for immunization: Secondary | ICD-10-CM

## 2015-04-01 DIAGNOSIS — E785 Hyperlipidemia, unspecified: Secondary | ICD-10-CM | POA: Diagnosis not present

## 2015-04-01 DIAGNOSIS — I10 Essential (primary) hypertension: Secondary | ICD-10-CM

## 2015-04-01 DIAGNOSIS — E1169 Type 2 diabetes mellitus with other specified complication: Secondary | ICD-10-CM

## 2015-04-01 DIAGNOSIS — R7303 Prediabetes: Secondary | ICD-10-CM | POA: Diagnosis not present

## 2015-04-01 DIAGNOSIS — Z114 Encounter for screening for human immunodeficiency virus [HIV]: Secondary | ICD-10-CM | POA: Diagnosis not present

## 2015-04-01 DIAGNOSIS — Z1231 Encounter for screening mammogram for malignant neoplasm of breast: Secondary | ICD-10-CM

## 2015-04-01 DIAGNOSIS — M5440 Lumbago with sciatica, unspecified side: Secondary | ICD-10-CM

## 2015-04-01 DIAGNOSIS — E669 Obesity, unspecified: Secondary | ICD-10-CM

## 2015-04-01 DIAGNOSIS — G4730 Sleep apnea, unspecified: Secondary | ICD-10-CM | POA: Insufficient documentation

## 2015-04-01 DIAGNOSIS — R739 Hyperglycemia, unspecified: Secondary | ICD-10-CM | POA: Diagnosis not present

## 2015-04-01 DIAGNOSIS — R0600 Dyspnea, unspecified: Secondary | ICD-10-CM

## 2015-04-01 DIAGNOSIS — E119 Type 2 diabetes mellitus without complications: Secondary | ICD-10-CM

## 2015-04-01 DIAGNOSIS — G478 Other sleep disorders: Secondary | ICD-10-CM

## 2015-04-01 DIAGNOSIS — R7309 Other abnormal glucose: Secondary | ICD-10-CM | POA: Diagnosis not present

## 2015-04-01 LAB — HEMOGLOBIN A1C
HEMOGLOBIN A1C: 12.8 % — AB (ref <5.7)
MEAN PLASMA GLUCOSE: 321 mg/dL — AB (ref <117)

## 2015-04-01 MED ORDER — TRAMADOL HCL 50 MG PO TABS: 50.0000 mg | ORAL_TABLET | Freq: Two times a day (BID) | ORAL | Status: DC

## 2015-04-01 MED ORDER — SPIRONOLACTONE 25 MG PO TABS: 25.0000 mg | ORAL_TABLET | Freq: Every day | ORAL | Status: DC

## 2015-04-01 NOTE — Assessment & Plan Note (Addendum)
Uncontrolled add spironolactone  DASH diet and commitment to daily physical activity for a minimum of 30 minutes discussed and encouraged, as a part of hypertension management. The importance of attaining a healthy weight is also discussed.  BP/Weight 04/01/2015 11/17/2013 06/21/2013 06/03/2012 04/29/2012 [id number]  Systolic BP [id number] -  Diastolic BP 82 84 61 88 80 80 -  Wt. (Lbs) 390 378 412.5 410.25 404.08 - 397  BMI 56.34 55.8 59.19 58.86 57.98 - 56.96

## 2015-04-01 NOTE — Patient Instructions (Addendum)
F/u in 6 weeks , call if you need me sooner  Flu  vaccine today  Labs todayy  New for back pain is tramadol twice daily  X ray of chest and low back today  Rinaldo Ratel will get disability parking placard today  You are referred for mammogram and evaluation  For sleep apnea

## 2015-04-01 NOTE — Progress Notes (Signed)
Subjective:    Patient ID: Tracey [name], female    DOB: 1956/01/25, 59 y.o.   MRN: [id number]  HPI Pt in to establish care, has had no PCP for over 5 years, was last seen in cardiology clinic Immunization is reviewed and updated as is her cancer screening which needs to be updated C/o fatigue, has been told she was "borderline diabetic"  Review of Systems See HPI Denies recent fever or chills. Denies sinus pressure, nasal congestion, ear pain or sore throat. Denies chest congestion, productive cough or wheezing. Denies chest pains, palpitations and leg swelling Denies abdominal pain, nausea, vomiting,diarrhea or constipation.   Denies dysuria, frequency, hesitancy or incontinence. C/o back and lower extremity pain and numbness, and tingling, difficulty standing for over 10 minutes , difficulty with walking and house chores, requests handicap sticker C/o excessive fatigue and snoring, excess daytime sleepiness Denies headaches, seizures, numbness, or tingling. Denies depression, anxiety or insomnia. Denies skin break down or rash.        Objective:   Physical Exam BP 140/82 mmHg  Pulse 110  Resp 18  Ht 5' 9.75" (1.772 m)  Wt 390 lb (176.903 kg)  BMI 56.34 kg/m2  SpO2 98% Patient obese, alert and oriented and in no cardiopulmonary distress.  HEENT: No facial asymmetry, EOMI,   oropharynx pink and moist.  Neck supple no JVD, no mass.  Chest: Clear to auscultation bilaterally.  CVS: S1, S2 no murmurs, no S3.Regular rate.  ABD: Soft non tender.   Ext: No edema  MS: Decreased  ROM lumbar  Spine,adequate in  shoulders, hips and knees.  Skin: Intact, no ulcerations or rash noted.  Psych: Good eye contact, normal affect. Memory intact not anxious or depressed appearing.  CNS: CN 2-12 intact, power,  normal throughout.no focal deficits noted.        Assessment & Plan:  Hypertension Uncontrolled add spironolactone  DASH diet and commitment to daily  physical activity for a minimum of 30 minutes discussed and encouraged, as a part of hypertension management. The importance of attaining a healthy weight is also discussed.  BP/Weight 04/01/2015 11/17/2013 06/21/2013 06/03/2012 04/29/2012 [id number]  Systolic BP [id number] [id number] [id number] -  Diastolic BP 82 84 61 88 80 80 -  Wt. (Lbs) 390 378 412.5 410.25 404.08 - 397  BMI 56.34 55.8 59.19 58.86 57.98 - 56.96         BACK PAIN, LUMBAR LOW BACK PAIN RADIAITNG TO LOWER EXTREMITIES WITH WEAKNESS , UNABLE TO WALK OR STAND FOR 15 MINUTES OR MORE  X ray of lumbar spine and weight loss recommended Tramadol for pain mangement  Morbid obesity Deteriorated. Patient re-educated about  the importance of commitment to a  minimum of 150 minutes of exercise per week.  The importance of healthy food choices with portion control discussed. Encouraged to start a food diary, count calories and to consider  joining a support group. Sample diet sheets offered. Goals set by the patient for the next several months.   Weight /BMI 04/01/2015 11/17/2013 06/21/2013  WEIGHT 390 lb 378 lb 412 lb 8 oz  HEIGHT 5' 9.75" 5\' 9"  -  BMI 56.34 kg/m2 55.8 kg/m2 59.19 kg/m2    Current exercise per week 30 minutes.   Diabetes mellitus type 2 in obese [facility]) New diagnosis. Pt called after visit when lab data available. Medication and in house teaching started and she is referred for diabetic ed. Ms. Hagemann is reminded of the importance of commitment to  daily physical activity for 30 minutes or more, as able and the need to limit carbohydrate intake to 30 to 60 grams per meal to help with blood sugar control.   The need to take medication as prescribed, test blood sugar as directed, and to call between visits if there is a concern that blood sugar is uncontrolled is also discussed.   Ms. Sondag is reminded of the importance of daily foot exam, annual eye examination, and good blood sugar, blood pressure and  cholesterol control.  Diabetic Labs Latest Ref Rng 04/10/2015 04/01/2015 11/17/2013 06/21/2013 04/15/2012  HbA1c <5.7 % - 12.8(H) - - -  Microalbumin Not estab mg/dL 6.9 - - - -  Micro/Creat Ratio <30 mcg/mg creat 28 - - - -  Chol 125 - 200 mg/dL - 190 168 - 215(H)  HDL >=46 mg/dL - 37(L) 41 - 44  Calc LDL <130 mg/dL - 129 102(H) - 153(H)  Triglycerides <150 mg/dL - 121 126 - 88  Creatinine 0.50 - 1.05 mg/dL - 0.68 0.76 0.63 0.79   BP/Weight 04/01/2015 11/17/2013 06/21/2013 06/03/2012 04/29/2012 [id number]  Systolic BP [id number] 123456 [id number] -  Diastolic BP 82 84 61 88 80 80 -  Wt. (Lbs) 390 378 412.5 410.25 404.08 - 397  BMI 56.34 55.8 59.19 58.86 57.98 - 56.96   No flowsheet data found.       Sleep disorder breathing H/o snoring and excessive daytime sleepiness refer for sleep study  Hyperlipidemia LDL goal <100 Hyperlipidemia:Low fat diet discussed and encouraged.   Lipid Panel  Lab Results  Component Value Date   CHOL 190 04/01/2015   HDL 37* 04/01/2015   LDLCALC 129 04/01/2015   TRIG 121 04/01/2015   CHOLHDL 5.1* 04/01/2015   Pt to start statin

## 2015-04-01 NOTE — Assessment & Plan Note (Addendum)
LOW BACK PAIN RADIAITNG TO LOWER EXTREMITIES WITH WEAKNESS , UNABLE TO WALK OR STAND FOR 15 MINUTES OR MORE  X ray of lumbar spine and weight loss recommended Tramadol for pain mangement

## 2015-04-02 LAB — COMPREHENSIVE METABOLIC PANEL
ALT: 28 U/L (ref 6–29)
AST: 28 U/L (ref 10–35)
Albumin: 3.8 g/dL (ref 3.6–5.1)
Alkaline Phosphatase: 96 U/L (ref 33–130)
BUN: 8 mg/dL (ref 7–25)
CALCIUM: 8.8 mg/dL (ref 8.6–10.4)
CO2: 23 mmol/L (ref 20–31)
Chloride: 95 mmol/L — ABNORMAL LOW (ref 98–110)
Creat: 0.68 mg/dL (ref 0.50–1.05)
Glucose, Bld: 408 mg/dL — ABNORMAL HIGH (ref 65–99)
POTASSIUM: 4.3 mmol/L (ref 3.5–5.3)
Sodium: 132 mmol/L — ABNORMAL LOW (ref 135–146)
TOTAL PROTEIN: 7.3 g/dL (ref 6.1–8.1)
Total Bilirubin: 0.7 mg/dL (ref 0.2–1.2)

## 2015-04-02 LAB — CBC
HCT: 43.7 % (ref 36.0–46.0)
HEMOGLOBIN: 13.6 g/dL (ref 12.0–15.0)
MCH: 22.9 pg — AB (ref 26.0–34.0)
MCHC: 31.1 g/dL (ref 30.0–36.0)
MCV: 73.7 fL — ABNORMAL LOW (ref 78.0–100.0)
Platelets: 270 10*3/uL (ref 150–400)
RBC: 5.93 MIL/uL — AB (ref 3.87–5.11)
RDW: 16.3 % — ABNORMAL HIGH (ref 11.5–15.5)
WBC: 10 10*3/uL (ref 4.0–10.5)

## 2015-04-02 LAB — LIPID PANEL
Cholesterol: 190 mg/dL (ref 125–200)
HDL: 37 mg/dL — ABNORMAL LOW (ref >=46)
LDL Cholesterol: 129 mg/dL (ref <130)
TRIGLYCERIDES: 121 mg/dL (ref <150)
Total CHOL/HDL Ratio: 5.1 Ratio — ABNORMAL HIGH (ref <=5.0)
VLDL: 24 mg/dL (ref <30)

## 2015-04-02 LAB — HEPATITIS C ANTIBODY: HCV AB: NEGATIVE

## 2015-04-02 LAB — HIV ANTIBODY (ROUTINE TESTING W REFLEX): HIV: NONREACTIVE

## 2015-04-04 ENCOUNTER — Ambulatory Visit (HOSPITAL_COMMUNITY)
Admission: RE | Admit: 2015-04-04 | Discharge: 2015-04-04 | Disposition: A | Discharge status: Home / Self Care | Payer: Medicare Other | Source: Ambulatory Visit | Attending: Family Medicine | Admitting: Family Medicine

## 2015-04-04 DIAGNOSIS — M5440 Lumbago with sciatica, unspecified side: Secondary | ICD-10-CM | POA: Diagnosis not present

## 2015-04-04 DIAGNOSIS — R0602 Shortness of breath: Secondary | ICD-10-CM | POA: Diagnosis not present

## 2015-04-04 DIAGNOSIS — M47896 Other spondylosis, lumbar region: Secondary | ICD-10-CM | POA: Diagnosis not present

## 2015-04-04 DIAGNOSIS — R0600 Dyspnea, unspecified: Secondary | ICD-10-CM | POA: Insufficient documentation

## 2015-04-04 DIAGNOSIS — R5383 Other fatigue: Secondary | ICD-10-CM | POA: Insufficient documentation

## 2015-04-04 DIAGNOSIS — R531 Weakness: Secondary | ICD-10-CM | POA: Insufficient documentation

## 2015-04-04 DIAGNOSIS — M47816 Spondylosis without myelopathy or radiculopathy, lumbar region: Secondary | ICD-10-CM | POA: Diagnosis not present

## 2015-04-05 ENCOUNTER — Other Ambulatory Visit: Payer: Self-pay

## 2015-04-05 DIAGNOSIS — E118 Type 2 diabetes mellitus with unspecified complications: Principal | ICD-10-CM

## 2015-04-05 DIAGNOSIS — IMO0002 Reserved for concepts with insufficient information to code with codable children: Secondary | ICD-10-CM

## 2015-04-05 DIAGNOSIS — E1165 Type 2 diabetes mellitus with hyperglycemia: Secondary | ICD-10-CM

## 2015-04-05 MED ORDER — CANAGLIFLOZIN 300 MG PO TABS: 300.0000 mg | ORAL_TABLET | Freq: Every day | ORAL | Status: DC

## 2015-04-05 MED ORDER — ACCU-CHEK SOFTCLIX LANCETS MISC: Status: DC

## 2015-04-05 MED ORDER — SITAGLIPTIN PHOS-METFORMIN HCL 50-1000 MG PO TABS: 1.0000 | ORAL_TABLET | Freq: Two times a day (BID) | ORAL | Status: DC

## 2015-04-05 MED ORDER — GLIPIZIDE ER 5 MG PO TB24: 5.0000 mg | ORAL_TABLET | Freq: Every day | ORAL | Status: DC

## 2015-04-05 MED ORDER — GLUCOSE BLOOD VI STRP: ORAL_STRIP | Status: DC

## 2015-04-10 ENCOUNTER — Ambulatory Visit (HOSPITAL_COMMUNITY)
Admission: RE | Admit: 2015-04-10 | Discharge: 2015-04-10 | Disposition: A | Discharge status: Home / Self Care | Payer: Medicare Other | Source: Ambulatory Visit | Attending: Family Medicine | Admitting: Family Medicine

## 2015-04-10 DIAGNOSIS — Z1231 Encounter for screening mammogram for malignant neoplasm of breast: Secondary | ICD-10-CM | POA: Insufficient documentation

## 2015-04-10 LAB — MICROALBUMIN / CREATININE URINE RATIO
Creatinine, Urine: 249 mg/dL (ref 20–320)
Microalb Creat Ratio: 28 mcg/mg creat (ref <30)
Microalb, Ur: 6.9 mg/dL

## 2015-04-15 NOTE — Assessment & Plan Note (Signed)
H/o snoring and excessive daytime sleepiness refer for sleep study

## 2015-04-15 NOTE — Assessment & Plan Note (Signed)
Deteriorated. Patient re-educated about  the importance of commitment to a  minimum of 150 minutes of exercise per week.  The importance of healthy food choices with portion control discussed. Encouraged to start a food diary, count calories and to consider  joining a support group. Sample diet sheets offered. Goals set by the patient for the next several months.   Weight /BMI 04/01/2015 11/17/2013 06/21/2013  WEIGHT 390 lb 378 lb 412 lb 8 oz  HEIGHT 5' 9.75" 5\' 9"  -  BMI 56.34 kg/m2 55.8 kg/m2 59.19 kg/m2    Current exercise per week 30 minutes.

## 2015-04-15 NOTE — Assessment & Plan Note (Signed)
New diagnosis. Pt called after visit when lab data available. Medication and in house teaching started and she is referred for diabetic ed. [name] is reminded of the importance of commitment to daily physical activity for 30 minutes or more, as able and the need to limit carbohydrate intake to 30 to 60 grams per meal to help with blood sugar control.   The need to take medication as prescribed, test blood sugar as directed, and to call between visits if there is a concern that blood sugar is uncontrolled is also discussed.   Tracey [name] is reminded of the importance of daily foot exam, annual eye examination, and good blood sugar, blood pressure and cholesterol control.  Diabetic Labs Latest Ref Rng 04/10/2015 04/01/2015 11/17/2013 06/21/2013 04/15/2012  HbA1c <5.7 % - 12.8(H) - - -  Microalbumin Not estab mg/dL 6.9 - - - -  Micro/Creat Ratio <30 mcg/mg creat 28 - - - -  Chol 125 - 200 mg/dL - 190 168 - 215(H)  HDL >=46 mg/dL - 37(L) 41 - 44  Calc LDL <130 mg/dL - 129 102(H) - 153(H)  Triglycerides <150 mg/dL - 121 126 - 88  Creatinine 0.50 - 1.05 mg/dL - 0.68 0.76 0.63 0.79   BP/Weight 04/01/2015 11/17/2013 06/21/2013 06/03/2012 04/29/2012 [id number]  Systolic BP [id number] 123456 [id number] -  Diastolic BP 82 84 61 88 80 80 -  Wt. (Lbs) 390 378 412.5 410.25 404.08 - 397  BMI 56.34 55.8 59.19 58.86 57.98 - 56.96   No flowsheet data found.

## 2015-04-15 NOTE — Assessment & Plan Note (Signed)
Hyperlipidemia:Low fat diet discussed and encouraged.   Lipid Panel  Lab Results  Component Value Date   CHOL 190 04/01/2015   HDL 37* 04/01/2015   LDLCALC 129 04/01/2015   TRIG 121 04/01/2015   CHOLHDL 5.1* 04/01/2015   Pt to start statin

## 2015-04-23 ENCOUNTER — Telehealth: Payer: Self-pay

## 2015-04-23 NOTE — Telephone Encounter (Signed)
Recently had appt with nutritionist and started taking her 3 diabetes meds   Fasting-325,178,164,153,180,185,170,135,166,135,153,151,120,153,139,129,129 Bedtime- [phone]  Has been taking her meds daily and trying to eat better and is really trying to get her sugar under better control and she can already see that her numbers are doing a lot better since she has been on the medication

## 2015-04-23 NOTE — Telephone Encounter (Signed)
Improved, continue current meds and NEASURE carbs and portion size, look encouraging/ good

## 2015-04-23 NOTE — Telephone Encounter (Signed)
Will follow up next week

## 2015-05-07 DIAGNOSIS — E119 Type 2 diabetes mellitus without complications: Secondary | ICD-10-CM | POA: Diagnosis not present

## 2015-05-07 DIAGNOSIS — I10 Essential (primary) hypertension: Secondary | ICD-10-CM | POA: Diagnosis not present

## 2015-05-07 DIAGNOSIS — G4733 Obstructive sleep apnea (adult) (pediatric): Secondary | ICD-10-CM | POA: Diagnosis not present

## 2015-05-07 DIAGNOSIS — M5440 Lumbago with sciatica, unspecified side: Secondary | ICD-10-CM | POA: Diagnosis not present

## 2015-05-09 ENCOUNTER — Encounter: Payer: Self-pay | Admitting: Nutrition

## 2015-05-09 ENCOUNTER — Encounter: Payer: Medicare Other | Attending: Family Medicine | Admitting: Nutrition

## 2015-05-09 VITALS — Ht 69.0 in | Wt 382.0 lb

## 2015-05-09 DIAGNOSIS — E118 Type 2 diabetes mellitus with unspecified complications: Secondary | ICD-10-CM | POA: Insufficient documentation

## 2015-05-09 DIAGNOSIS — E1165 Type 2 diabetes mellitus with hyperglycemia: Secondary | ICD-10-CM | POA: Diagnosis not present

## 2015-05-09 DIAGNOSIS — IMO0002 Reserved for concepts with insufficient information to code with codable children: Secondary | ICD-10-CM

## 2015-05-09 NOTE — Progress Notes (Signed)
Diabetes Self-Management Education  Visit Type: First/Initial  Appt. Start Time: 0800Appt. End Time: 930  05/09/2015  [name], identified by name and date of birth, is a 60 y.o. female with a diagnosis of Diabetes: Type 2. Just found out she is diabetic about a month ago. A1C 12.8%. LIves with her son. She cooks some but eats out a lot. Lamonte Sakai been only eating 1 meal per day and eating a lot of sweets and drinking a lot of soda before she found out she was diabetic. Limited mobility due to back and leg issues. On disability. Has had symptoms of hyper/hypgoglycemia.  ASSESSMENT  Height 5\' 9"  (1.753 m), weight 382 lb (173.274 kg). Body mass index is 56.39 kg/(m^2).  Lab Results  Component Value Date   HGBA1C 12.8* 04/01/2015    Lab Results  Component Value Date   CHOL 190 04/01/2015   HDL 37* 04/01/2015   LDLCALC 129 04/01/2015   TRIG 121 04/01/2015   CHOLHDL 5.1* 04/01/2015       Diabetes Self-Management Education - 05/09/15 0935    Patient Education   Nutrition management  Role of diet in the treatment of diabetes and the relationship between the three main macronutrients and blood glucose level;Carbohydrate counting;Meal timing in regards to the patients' current diabetes medication.;Effects of alcohol on blood glucose and safety factors with consumption of alcohol.   Physical activity and exercise  Role of exercise on diabetes management, blood pressure control and cardiac health.;Identified with patient nutritional and/or medication changes necessary with exercise.   Medications Reviewed medication adjustment guidelines for hyperglycemia and sick days.;Reviewed patients medication for diabetes, action, purpose, timing of dose and side effects.   Monitoring Identified appropriate SMBG and/or A1C goals.;Purpose and frequency of SMBG.;Taught/discussed recording of test results and interpretation of SMBG.   Acute complications Taught treatment of hypoglycemia - the 15  rule.;Discussed and identified patients' treatment of hyperglycemia.   Chronic complications Relationship between chronic complications and blood glucose control;Identified and discussed with patient  current chronic complications;Dental care;Retinopathy and reason for yearly dilated eye exams;Assessed and discussed foot care and prevention of foot problems   Psychosocial adjustment Worked with patient to identify barriers to care and solutions;Helped patient identify a support system for diabetes management;Identified and addressed patients feelings and concerns about diabetes   Personal strategies to promote health Lifestyle issues that need to be addressed for better diabetes care;Helped patient develop diabetes management plan for (enter comment)   Individualized Goals (developed by patient)   Nutrition Follow meal plan discussed;General guidelines for healthy choices and portions discussed;Adjust meds/carbs with exercise as discussed   Physical Activity Exercise 3-5 times per week;15 minutes per day   Medications take my medication as prescribed   Monitoring  test my blood glucose as discussed   Reducing Risk examine blood glucose patterns;do foot checks daily;treat hypoglycemia with 15 grams of carbs if blood glucose less than 70mg /dL   Outcomes   Expected Outcomes Demonstrated interest in learning. Expect positive outcomes   Future DMSE 4-6 wks   Program Status Completed      Individualized Plan for Diabetes Self-Management Training:   Learning Objective:  Patient will have a greater understanding of diabetes self-management. Patient education plan is to attend individual and/or group sessions per assessed needs and concerns.   Plan:   Patient Instructions  Goals 1. Follow Plate Method  2. Eat 3-4 carb choices per meal. 3 Cut out soda and junk food/sweets 4. Increase fresh fruits and vegetables. 5.  Drink 5 or more bottles of water per day. 6. Take Janumet.after breakfast 6.  Dont skip meals. 7. Get A1C down to 7%. 8 .Lose 5 lbs per month.    Expected Outcomes:  Demonstrated interest in learning. Expect positive outcomes  Education material provided: Living Well with Diabetes, Meal plan card, My Plate and Carbohydrate counting sheet  If problems or questions, patient to contact team via:  Phone  Future DSME appointment: 4-6 wks

## 2015-05-09 NOTE — Patient Instructions (Signed)
Goals 1. Follow Plate Method  2. Eat 3-4 carb choices per meal. 3 Cut out soda and junk food/sweets 4. Increase fresh fruits and vegetables. 5.  Drink 5 or more bottles of water per day. 6. Take Janumet.after breakfast 6. Dont skip meals. 7. Get A1C down to 7%. 8 .Lose 5 lbs per month.

## 2015-05-13 ENCOUNTER — Encounter: Payer: Self-pay | Admitting: Family Medicine

## 2015-05-13 ENCOUNTER — Ambulatory Visit (INDEPENDENT_AMBULATORY_CARE_PROVIDER_SITE_OTHER): Payer: Medicare Other | Admitting: Family Medicine

## 2015-05-13 ENCOUNTER — Other Ambulatory Visit: Payer: Self-pay

## 2015-05-13 VITALS — BP 128/82 | HR 120 | Resp 16 | Ht 70.0 in | Wt 380.1 lb

## 2015-05-13 DIAGNOSIS — E785 Hyperlipidemia, unspecified: Secondary | ICD-10-CM

## 2015-05-13 DIAGNOSIS — R000 Tachycardia, unspecified: Secondary | ICD-10-CM | POA: Diagnosis not present

## 2015-05-13 DIAGNOSIS — I158 Other secondary hypertension: Secondary | ICD-10-CM

## 2015-05-13 DIAGNOSIS — E669 Obesity, unspecified: Secondary | ICD-10-CM | POA: Diagnosis not present

## 2015-05-13 DIAGNOSIS — E119 Type 2 diabetes mellitus without complications: Secondary | ICD-10-CM

## 2015-05-13 DIAGNOSIS — E1169 Type 2 diabetes mellitus with other specified complication: Secondary | ICD-10-CM

## 2015-05-13 DIAGNOSIS — Z23 Encounter for immunization: Secondary | ICD-10-CM

## 2015-05-13 NOTE — Progress Notes (Signed)
Subjective:    Patient ID: CHARRA [name], female    DOB: Mar 30, 1956, 60 y.o.   MRN: [id number]  HPI   Tracey [name]     MRN: [id number]      DOB: 1956-03-21   HPI Tracey [name] is here for follow up and re-evaluation of chronic medical conditions, medication management and review of any available recent lab and radiology data.  Preventive health is updated, specifically  Cancer screening and Immunization.   Questions or concerns regarding consultations or procedures which the PT has had in the interim are  addressed. The PT denies any adverse reactions to current medications since the last visit. Denies hypoglycemia. Has been testing twice daily and comes in with ehr log book and meter showing blood sugars within range Feels better Denies polyuria, polydipsia, blurred vision , or hypoglycemic episodes.  ROS Denies recent fever or chills. Denies sinus pressure, nasal congestion, ear pain or sore throat. Denies chest congestion, productive cough or wheezing. Denies chest pains, palpitations and leg swelling Denies abdominal pain, nausea, vomiting,diarrhea or constipation.   Denies dysuria, frequency, hesitancy or incontinence. Denies joint pain, swelling and limitation in mobility. Denies headaches, seizures, numbness, or tingling. Denies depression, anxiety or insomnia. Denies skin break down or rash.   PE  BP 128/82 mmHg  Pulse 120  Resp 16  Ht 5\' 10"  (1.778 m)  Wt 380 lb 1.9 oz (172.421 kg)  BMI 54.54 kg/m2  SpO2 97%  Patient alert and oriented and in no cardiopulmonary distress.  HEENT: No facial asymmetry, EOMI,   oropharynx pink and moist.  Neck supple no JVD, no mass.  Chest: Clear to auscultation bilaterally.  CVS: S1, S2 no murmurs, no S3.Regular rate.  ABD: Soft non tender.   Ext: No edema  MS: Adequate ROM spine, shoulders, hips and knees.  Skin: Intact, no ulcerations or rash noted.  Psych: Good eye contact, normal affect. Memory intact not  anxious or depressed appearing.  CNS: CN 2-12 intact, power,  normal throughout.no focal deficits noted.   Assessment & Plan  Hypertension Controlled, no change in medication DASH diet and commitment to daily physical activity for a minimum of 30 minutes discussed and encouraged, as a part of hypertension management. The importance of attaining a healthy weight is also discussed.  BP/Weight 05/13/2015 05/09/2015 04/01/2015 11/17/2013 06/21/2013 06/03/2012 99991111  Systolic BP 0000000 - [id number]  Diastolic BP 82 - 82 84 61 88 80  Wt. (Lbs) 380.12 382 390 378 412.5 410.25 404.08  BMI 54.54 56.39 56.34 55.8 59.19 58.86 57.98        Diabetes mellitus type 2 in obese [facility]) Tracey [name] is reminded of the importance of commitment to daily physical activity for 30 minutes or more, as able and the need to limit carbohydrate intake to 30 to 60 grams per meal to help with blood sugar control.   The need to take medication as prescribed, test blood sugar as directed, and to call between visits if there is a concern that blood sugar is uncontrolled is also discussed.   Tracey [name] is reminded of the importance of daily foot exam, annual eye examination, and good blood sugar, blood pressure and cholesterol control. Improved blood sugar on current meds, needs to keep appt with nutritionist, doing well  Diabetic Labs Latest Ref Rng 04/10/2015 04/01/2015 11/17/2013 06/21/2013 04/15/2012  HbA1c <5.7 % - 12.8(H) - - -  Microalbumin Not estab mg/dL 6.9 - - - -  Micro/Creat Ratio <  30 mcg/mg creat 28 - - - -  Chol 125 - 200 mg/dL - 190 168 - 215(H)  HDL >=46 mg/dL - 37(L) 41 - 44  Calc LDL <130 mg/dL - 129 102(H) - 153(H)  Triglycerides <150 mg/dL - 121 126 - 88  Creatinine 0.50 - 1.05 mg/dL - 0.68 0.76 0.63 0.79   BP/Weight 05/13/2015 05/09/2015 04/01/2015 11/17/2013 06/21/2013 06/03/2012 99991111  Systolic BP 0000000 - [id number]  Diastolic BP 82 - 82 84 61 88 80  Wt. (Lbs) 380.12 382 390 378  412.5 410.25 404.08  BMI 54.54 56.39 56.34 55.8 59.19 58.86 57.98   Foot/eye exam completion dates 05/13/2015  Foot Form Completion Done         Morbid obesity Improved Patient re-educated about  the importance of commitment to a  minimum of 150 minutes of exercise per week.  The importance of healthy food choices with portion control discussed. Encouraged to start a food diary, count calories and to consider  joining a support group. Sample diet sheets offered. Goals set by the patient for the next several months.   Weight /BMI 05/13/2015 05/09/2015 04/01/2015  WEIGHT 380 lb 1.9 oz 382 lb 390 lb  HEIGHT 5\' 10"  5\' 9"  5' 9.75"  BMI 54.54 kg/m2 56.39 kg/m2 56.34 kg/m2    Current exercise per week 90 minutes.   Tachycardia Asymptomatic tachycardia. EKG in office shows sinus tachycardia, no lVH no ischemia, "borderline" reported rightward axis will need to f/u with cardiology  Will check tsh   Need for 23-polyvalent pneumococcal polysaccharide vaccine After obtaining informed consent, the vaccine is  administered by LPN.   Hyperlipidemia LDL goal <100 Hyperlipidemia:Low fat diet discussed and encouraged.   Lipid Panel  Lab Results  Component Value Date   CHOL 190 04/01/2015   HDL 37* 04/01/2015   LDLCALC 129 04/01/2015   TRIG 121 04/01/2015   CHOLHDL 5.1* 04/01/2015   Not at goal, no med change, dietary change encouraged        Review of Systems     Objective:   Physical Exam        Assessment & Plan:

## 2015-05-13 NOTE — Patient Instructions (Addendum)
F/u first week in April, call if you need me sooner  [facility] IMPROVED BLOOD SUGARS, you arere doing extremely well.  No change in any of your  medication or twice daily testing yet  ALWAYS EAT ON REGULAR SCHEDULE  You have lost 10 pounds  Non fasting labs March 28 or shortly after  You are referred to podiatry for foot care and you qualify for diabetic shoes  It is important that you exercise regularly at least 30 minutes 5 times a week. If you develop chest pain, have severe difficulty breathing, or feel very tired, stop exercising immediately and seek medical attention  Pneumonia 23 vaccine today

## 2015-05-20 ENCOUNTER — Telehealth: Payer: Self-pay | Admitting: Family Medicine

## 2015-05-20 MED ORDER — ACCU-CHEK SOFTCLIX LANCETS MISC: Status: DC

## 2015-05-20 MED ORDER — GLUCOSE BLOOD VI STRP: ORAL_STRIP | Status: DC

## 2015-05-20 NOTE — Telephone Encounter (Signed)
Patient needs Diabetic Strips called to [facility], please advise?

## 2015-05-20 NOTE — Telephone Encounter (Signed)
Sent to ca  

## 2015-05-24 ENCOUNTER — Other Ambulatory Visit (HOSPITAL_COMMUNITY): Payer: Self-pay | Admitting: Respiratory Therapy

## 2015-05-24 DIAGNOSIS — G4733 Obstructive sleep apnea (adult) (pediatric): Secondary | ICD-10-CM

## 2015-05-24 DIAGNOSIS — E119 Type 2 diabetes mellitus without complications: Secondary | ICD-10-CM

## 2015-05-24 DIAGNOSIS — I10 Essential (primary) hypertension: Secondary | ICD-10-CM

## 2015-05-26 ENCOUNTER — Telehealth: Payer: Self-pay | Admitting: Family Medicine

## 2015-05-26 DIAGNOSIS — Z23 Encounter for immunization: Secondary | ICD-10-CM | POA: Insufficient documentation

## 2015-05-26 NOTE — Assessment & Plan Note (Signed)
Hyperlipidemia:Low fat diet discussed and encouraged.   Lipid Panel  Lab Results  Component Value Date   CHOL 190 04/01/2015   HDL 37* 04/01/2015   LDLCALC 129 04/01/2015   TRIG 121 04/01/2015   CHOLHDL 5.1* 04/01/2015   Not at goal, no med change, dietary change encouraged

## 2015-05-26 NOTE — Assessment & Plan Note (Addendum)
Asymptomatic tachycardia. EKG in office shows sinus tachycardia, no lVH no ischemia, "borderline" reported rightward axis will need to f/u with cardiology  Will check tsh

## 2015-05-26 NOTE — Assessment & Plan Note (Signed)
Controlled, no change in medication DASH diet and commitment to daily physical activity for a minimum of 30 minutes discussed and encouraged, as a part of hypertension management. The importance of attaining a healthy weight is also discussed.  BP/Weight 05/13/2015 05/09/2015 04/01/2015 11/17/2013 06/21/2013 06/03/2012 [id number]  Systolic BP 0000000 - [id number]  Diastolic BP 82 - 82 84 61 88 80  Wt. (Lbs) 380.12 382 390 378 412.5 410.25 404.08  BMI 54.54 56.39 56.34 55.8 59.19 58.86 57.98

## 2015-05-26 NOTE — Telephone Encounter (Signed)
Pls contact pt and let her know that I have referred her to cardiologist since her heart rate was fast at last visit.  She has seen Dr Harrington Challenger in the past, I am assuming she will see Doc in [place] but that  Will be patient dependent  After you speak with her pls let referral staff know so she can schedule appt/ notify pt of appt, thanks

## 2015-05-26 NOTE — Assessment & Plan Note (Signed)
After obtaining informed consent, the vaccine is  administered by LPN.  

## 2015-05-26 NOTE — Assessment & Plan Note (Addendum)
Tracey [name] is reminded of the importance of commitment to daily physical activity for 30 minutes or more, as able and the need to limit carbohydrate intake to 30 to 60 grams per meal to help with blood sugar control.   The need to take medication as prescribed, test blood sugar as directed, and to call between visits if there is a concern that blood sugar is uncontrolled is also discussed.   Tracey [name] is reminded of the importance of daily foot exam, annual eye examination, and good blood sugar, blood pressure and cholesterol control. Improved blood sugar on current meds, needs to keep appt with nutritionist, doing well  Diabetic Labs Latest Ref Rng 04/10/2015 04/01/2015 11/17/2013 06/21/2013 04/15/2012  HbA1c <5.7 % - 12.8(H) - - -  Microalbumin Not estab mg/dL 6.9 - - - -  Micro/Creat Ratio <30 mcg/mg creat 28 - - - -  Chol 125 - 200 mg/dL - 190 168 - 215(H)  HDL >=46 mg/dL - 37(L) 41 - 44  Calc LDL <130 mg/dL - 129 102(H) - 153(H)  Triglycerides <150 mg/dL - 121 126 - 88  Creatinine 0.50 - 1.05 mg/dL - 0.68 0.76 0.63 0.79   BP/Weight 05/13/2015 05/09/2015 04/01/2015 11/17/2013 06/21/2013 06/03/2012 99991111  Systolic BP 0000000 - [id number] [id number]  Diastolic BP 82 - 82 84 61 88 80  Wt. (Lbs) 380.12 382 390 378 412.5 410.25 404.08  BMI 54.54 56.39 56.34 55.8 59.19 58.86 57.98   Foot/eye exam completion dates 05/13/2015  Foot Form Completion Done

## 2015-05-26 NOTE — Assessment & Plan Note (Signed)
Improved Patient re-educated about  the importance of commitment to a  minimum of 150 minutes of exercise per week.  The importance of healthy food choices with portion control discussed. Encouraged to start a food diary, count calories and to consider  joining a support group. Sample diet sheets offered. Goals set by the patient for the next several months.   Weight /BMI 05/13/2015 05/09/2015 04/01/2015  WEIGHT 380 lb 1.9 oz 382 lb 390 lb  HEIGHT 5\' 10"  5\' 9"  5' 9.75"  BMI 54.54 kg/m2 56.39 kg/m2 56.34 kg/m2    Current exercise per week 90 minutes.

## 2015-05-27 NOTE — Telephone Encounter (Signed)
Patient aware and they have already called her

## 2015-06-06 ENCOUNTER — Other Ambulatory Visit: Payer: Self-pay | Admitting: Internal Medicine

## 2015-06-06 ENCOUNTER — Other Ambulatory Visit: Payer: Self-pay | Admitting: Family Medicine

## 2015-06-10 ENCOUNTER — Encounter: Payer: Self-pay | Admitting: Nutrition

## 2015-06-10 ENCOUNTER — Encounter: Payer: Medicare Other | Attending: Family Medicine | Admitting: Nutrition

## 2015-06-10 VITALS — Ht 69.0 in | Wt 381.4 lb

## 2015-06-10 DIAGNOSIS — E118 Type 2 diabetes mellitus with unspecified complications: Secondary | ICD-10-CM | POA: Diagnosis not present

## 2015-06-10 DIAGNOSIS — IMO0002 Reserved for concepts with insufficient information to code with codable children: Secondary | ICD-10-CM

## 2015-06-10 DIAGNOSIS — E1165 Type 2 diabetes mellitus with hyperglycemia: Secondary | ICD-10-CM | POA: Diagnosis not present

## 2015-06-10 NOTE — Patient Instructions (Signed)
Plan:   Goals: 1. Continue to follow Plate Method 2. Avoid snack unless low blood sugar. 3. Treat low blood sugar with 15 grams of carbs: 4 oz juice, 3 pieces of hard candy or 1 tablespoon of jelly or syrup. 4. Increase walking or exercise to 15-20 minutes daily. 5. Call Dr. Dorris Fetch if problems with low blood sugars less than 70 or over 300 mg/dl. 6. Get A1C to 7%  7. Continue to lose 1-2 lbs per week.

## 2015-06-10 NOTE — Progress Notes (Signed)
Diabetes Self-Management Education  Visit Type:  Follow-up  Appt. Start Time: 1030 Appt. End Time: 1100  06/10/2015  Tracey [name], identified by name and date of birth, is a 60 y.o. female with a diagnosis of Diabetes: Type 2.  She notes she feels a lot better. BS doing much better. AVG BS 109 mg/dl. Doing chair exercises. Only  Lost 1 lb but hasn't taken her fluid pill yet. Eating better balanced meals. Needs increased physical actvity for needed weight loss. Has been trying to eat fruit if hungry and drinking a lot of water.  She is still on Janument and Glipizide. No recent A1C . Will get at next visit after she sees Dr. Moshe Cipro.  Height 5\' 9"  (1.753 m), weight 381 lb 6.4 oz (173.002 kg). Body mass index is 56.3 kg/(m^2).       Diabetes Self-Management Education - 06/10/15 1053    Health Coping   How would you rate your overall health? Fair   Psychosocial Assessment   Patient Belief/Attitude about Diabetes Motivated to manage diabetes   Self-care barriers Unsteady gait/risk for falls   Self-management support Doctor's office;Family;Friends;CDE visits   Patient Concerns Nutrition/Meal planning;Medication;Monitoring;Healthy Lifestyle;Weight Control   Special Needs Large print;Simplified materials;None;Instruct caregiver   Preferred Learning Style No preference indicated   Learning Readiness Change in progress   Complications   How often do you check your blood sugar? 1-2 times per day   Fasting Blood glucose range (mg/dL) 109-141   Postprandial Blood glucose range (mg/dL) 130-179   Number of hypoglycemic episodes per month 1   Number of hyperglycemic episodes per week 2   Can you tell when your blood sugar is high? No   What do you do if your blood sugar is high? drink water   Have you had a dilated eye exam in the past 12 months? Yes   Have you had a dental exam in the past 12 months? Yes   Are you checking your feet? Yes   Dietary Intake   Breakfast Oatmeal and sf  jello, water    Snack (morning) orange or nuts   Lunch Chicken pasta, salad, water   Dinner Hamburger, salad, fruit, dessert- (birthday) water   Snack (evening) Fruit or yogurt   Exercise   Exercise Type ADL's;Light (walking / raking leaves)   How many days per week to you exercise? 3   How many minutes per day do you exercise? 15   Total minutes per week of exercise 45   Patient Education   Previous Diabetes Education Yes (please comment)  previous visit   Disease state  Explored patient's options for treatment of their diabetes   Nutrition management  Carbohydrate counting;Meal timing in regards to the patients' current diabetes medication.;Meal options for control of blood glucose level and chronic complications.;Information on hints to eating out and maintain blood glucose control.   Physical activity and exercise  Role of exercise on diabetes management, blood pressure control and cardiac health.   Medications Reviewed patients medication for diabetes, action, purpose, timing of dose and side effects.;Reviewed medication adjustment guidelines for hyperglycemia and sick days.   Monitoring Purpose and frequency of SMBG.;Taught/discussed recording of test results and interpretation of SMBG.;Identified appropriate SMBG and/or A1C goals.   Acute complications Taught treatment of hypoglycemia - the 15 rule.;Covered sick day management with medication and food.   Chronic complications Relationship between chronic complications and blood glucose control;Identified and discussed with patient  current chronic complications   Psychosocial adjustment Worked  with patient to identify barriers to care and solutions;Helped patient identify a support system for diabetes management   Individualized Goals (developed by patient)   Nutrition Follow meal plan discussed;Adjust meds/carbs with exercise as discussed   Physical Activity Exercise 3-5 times per week   Monitoring  test my blood glucose as  discussed;test blood glucose pre and post meals as discussed   Reducing Risk examine blood glucose patterns;do foot checks daily;treat hypoglycemia with 15 grams of carbs if blood glucose less than 70mg /dL   Patient Self-Evaluation of Goals - Patient rates self as meeting previously set goals (% of time)   Nutrition >75%   Physical Activity >75%   Medications >75%   Monitoring >75%   Problem Solving >75%   Reducing Risk >75%   Health Coping >75%   Outcomes   Program Status Completed   Subsequent Visit   Since your last visit have you continued or begun to take your medications as prescribed? Yes   Since your last visit have you had your blood pressure checked? Yes   Is your most recent blood pressure lower, unchanged, or higher since your last visit? Lower   Since your last visit have you experienced any weight changes? Loss   Weight Loss (lbs) 1   Since your last visit, are you checking your blood glucose at least once a day? Yes      Learning Objective:  Patient will have a greater understanding of diabetes self-management. Patient education plan is to attend individual and/or group sessions per assessed needs and concerns.   Plan:   Goals: 1. Continue to follow Plate Method 2. Avoid snack unless low blood sugar. 3. Treat low blood sugar with 15 grams of carbs: 4 oz juice, 3 pieces of hard candy or 1 tablespoon of jelly or syrup. 4. Increase walking or exercise to 15-20 minutes daily. 5. Call Dr. Dorris Fetch if problems with low blood sugars less than 70 or over 300 mg/dl. 6. Get A1C to 7%  7. Continue to lose 1-2 lbs per week.   Expected Outcomes:  Demonstrated interest in learning. Expect positive outcomes  Education material provided: Meal plan card, My Plate and Carbohydrate counting sheet  If problems or questions, patient to contact team via:  Phone  Future DSME appointment: - 2 months

## 2015-06-13 ENCOUNTER — Other Ambulatory Visit: Payer: Self-pay | Admitting: Family Medicine

## 2015-06-18 DIAGNOSIS — G4733 Obstructive sleep apnea (adult) (pediatric): Secondary | ICD-10-CM | POA: Diagnosis not present

## 2015-06-18 DIAGNOSIS — E119 Type 2 diabetes mellitus without complications: Secondary | ICD-10-CM | POA: Diagnosis not present

## 2015-06-18 DIAGNOSIS — I10 Essential (primary) hypertension: Secondary | ICD-10-CM | POA: Diagnosis not present

## 2015-06-18 DIAGNOSIS — M5440 Lumbago with sciatica, unspecified side: Secondary | ICD-10-CM | POA: Diagnosis not present

## 2015-06-24 ENCOUNTER — Ambulatory Visit (INDEPENDENT_AMBULATORY_CARE_PROVIDER_SITE_OTHER): Payer: Medicare Other | Admitting: Internal Medicine

## 2015-06-24 ENCOUNTER — Encounter: Payer: Self-pay | Admitting: Internal Medicine

## 2015-06-24 VITALS — BP 128/70 | HR 112 | Ht 70.0 in | Wt 377.0 lb

## 2015-06-24 DIAGNOSIS — R0600 Dyspnea, unspecified: Secondary | ICD-10-CM

## 2015-06-24 DIAGNOSIS — R000 Tachycardia, unspecified: Secondary | ICD-10-CM | POA: Diagnosis not present

## 2015-06-24 DIAGNOSIS — D649 Anemia, unspecified: Secondary | ICD-10-CM

## 2015-06-24 MED ORDER — ATORVASTATIN CALCIUM 40 MG PO TABS: 40.0000 mg | ORAL_TABLET | Freq: Every day | ORAL | Status: DC

## 2015-06-24 NOTE — Progress Notes (Signed)
Cardiology Office Note   Date:  06/24/2015   ID:  Tracey [name], DOB 09-13-1955, MRN [id number]  PCP:  Tula Nakayama, MD  Cardiologist:   [name], MD   Pt referred for evaluation of tachycardia by Dr Moshe Cipro    History of Present Illness: Tracey [name] is a 60 y.o. female with a history of CP, HTN, HL and morbid obesity  R/O for MI in past   Dobutamine echo was normal.  I saw her in Feb 2014.   Followed by Dr Moshe Cipro  Seen earlier this winter  HR was elevate   EKG in Jan HR was 114.   Pt denies  dizziness  No racing of heart  Breathing stable but Pt gets SOB with walking to car Occasional CP that last seconds.     Outpatient Prescriptions Prior to Visit  Medication Sig Dispense Refill  . ACCU-CHEK SOFTCLIX LANCETS lancets Use as instructed twice daily dx E11.65 100 each 5  . amLODipine (NORVASC) 2.5 MG tablet TAKE ONE (1) TABLET EACH DAY 30 tablet 1  . atorvastatin (LIPITOR) 20 MG tablet TAKE ONE TABLET EACH DAY AT 6:00PM FOR TREATMENT OF HIGH CHOLESTEROL 30 tablet 1  . glipiZIDE (GLUCOTROL XL) 5 MG 24 hr tablet TAKE ONE TABLET DAILY WITH BREAKFAST 30 tablet 3  . glucose blood (ACCU-CHEK AVIVA) test strip Use as instructed twice daily dx E11.65 100 each 5  . ibuprofen (ADVIL,MOTRIN) 200 MG tablet Take 200 mg by mouth as needed.    . INVOKANA 300 MG TABS tablet TAKE ONE (1) TABLET EACH DAY BEFORE BREAKFAST 30 tablet 3  . JANUMET 50-1000 MG tablet TAKE ONE TABLET TWICE DAILY 60 tablet 2  . omeprazole (PRILOSEC) 20 MG capsule TAKE ONE (1) CAPSULE EACH DAY 30 capsule 1  . spironolactone (ALDACTONE) 25 MG tablet Take 1 tablet (25 mg total) by mouth daily. 30 tablet 2  . traMADol (ULTRAM) 50 MG tablet Take 1 tablet (50 mg total) by mouth 2 (two) times daily. 60 tablet 1   No facility-administered medications prior to visit.     Allergies:   Haloperidol lactate   Past Medical History  Diagnosis Date  . PE (pulmonary embolism)   . Diastolic dysfunction [id number]   Grade 1  . Morbid obesity ([place])   . Hyperlipidemia   . Pulmonary embolism ([place])   . GERD (gastroesophageal reflux disease)   . Hyperlipidemia   . Anginal pain ([place])   . Arthritis   . Prediabetes 04/15/2012  . Hypertension 04/15/2012    Past Surgical History  Procedure Laterality Date  . Cesarean section    . Cholecystectomy  1996  . Abdominal hysterectomy  1999    fibroids     Social History:  The patient  reports that she has never smoked. She does not have any smokeless tobacco history on file. She reports that she does not drink alcohol or use illicit drugs.   Family History:  The patient's family history includes Alcohol abuse in her father; Aneurysm in her brother; Diabetes in her mother; Heart disease (age of onset: 11) in her sister; Hyperlipidemia in her father; Hypertension in her father and mother.    ROS:  Please see the history of present illness. All other systems are reviewed and  Negative to the above problem except as noted.    PHYSICAL EXAM: VS:  BP 128/70 mmHg  Pulse 112  Ht 5\' 10"  (1.778 m)  Wt 377 lb (171.006 kg)  BMI 54.09 kg/m2  SpO2 98%  GEN: Morbidly obese 60 yo, in no acute distress HEENT: normal Neck: no JVD, carotid bruits, or masses Cardiac: RRR; no murmurs, rubs, or gallops  Distant HS ,no edema  Respiratory:  clear to auscultation bilaterally, normal work of breathing GI: soft, Obese  Nontender   MS: no deformity Moving all extremities   Skin: warm and dry, no rash Neuro:  Strength and sensation are intact Psych: euthymic mood, full affect   EKG:  EKG is not ordered today.   Lipid Panel    Component Value Date/Time   CHOL 190 04/01/2015 1610   TRIG 121 04/01/2015 1610   HDL 37* 04/01/2015 1610   CHOLHDL 5.1* 04/01/2015 1610   VLDL 24 04/01/2015 1610   LDLCALC 129 04/01/2015 1610      Wt Readings from Last 3 Encounters:  06/24/15 377 lb (171.006 kg)  06/10/15 381 lb 6.4 oz (173.002 kg)  05/13/15 380 lb 1.9 oz (172.421 kg)        ASSESSMENT AND PLAN:  1  Tachycardia  Pt with ST on EKG  Here in clinc HR is 112  She denies dizziness  Has chronic SOB  (may be due to size) I would recomm 1.  Holter to eval 24 hour control  2.  Echo  3.  Check CBC and TSH  2.  CP  Atypical  3.  HL  Needs tighter control of lipids  Would increase lipitor to 40  Goal for LDL lower than 100.    4  HTN  Adequate control  5  DM  6  Morbid obesity  Needs to lose wt.    F/U will be based on test results     Signed, [name], MD  06/24/2015 9:41 AM    [address], [place], [place]  [address] Phone: [phone]; Fax: [phone]

## 2015-06-24 NOTE — Patient Instructions (Signed)
Medication Instructions:  INCREASE LIPITOR TO 40 MG DAILY   Labwork: Your physician recommends that you return for lab work in: ASAP CBC   Testing/Procedures: Your physician has recommended that you wear a holter monitor. Holter monitors are medical devices that record the heart's electrical activity. Doctors most often use these monitors to diagnose arrhythmias. Arrhythmias are problems with the speed or rhythm of the heartbeat. The monitor is a small, portable device. You can wear one while you do your normal daily activities. This is usually used to diagnose what is causing palpitations/syncope (passing out).  Your physician has requested that you have an echocardiogram. Echocardiography is a painless test that uses sound waves to create images of your heart. It provides your doctor with information about the size and shape of your heart and how well your heart's chambers and valves are working. This procedure takes approximately one hour. There are no restrictions for this procedure.    Follow-Up: Your physician recommends that you schedule a follow-up appointment in: TO BE DETERMINED BASED ON RESULTS TO TESTS   Any Other Special Instructions Will Be Listed Below (If Applicable).     If you need a refill on your cardiac medications before your next appointment, please call your pharmacy.

## 2015-06-27 DIAGNOSIS — I739 Peripheral vascular disease, unspecified: Secondary | ICD-10-CM | POA: Diagnosis not present

## 2015-06-27 DIAGNOSIS — L603 Nail dystrophy: Secondary | ICD-10-CM | POA: Diagnosis not present

## 2015-06-27 DIAGNOSIS — M79672 Pain in left foot: Secondary | ICD-10-CM | POA: Diagnosis not present

## 2015-06-27 DIAGNOSIS — E1151 Type 2 diabetes mellitus with diabetic peripheral angiopathy without gangrene: Secondary | ICD-10-CM | POA: Diagnosis not present

## 2015-06-27 DIAGNOSIS — M79671 Pain in right foot: Secondary | ICD-10-CM | POA: Diagnosis not present

## 2015-07-01 ENCOUNTER — Ambulatory Visit (HOSPITAL_COMMUNITY): Payer: Medicare Other

## 2015-07-03 ENCOUNTER — Ambulatory Visit (HOSPITAL_COMMUNITY)
Admission: RE | Admit: 2015-07-03 | Discharge: 2015-07-03 | Disposition: A | Discharge status: Home / Self Care | Payer: Medicare Other | Source: Ambulatory Visit | Attending: Internal Medicine | Admitting: Internal Medicine

## 2015-07-03 ENCOUNTER — Ambulatory Visit (HOSPITAL_BASED_OUTPATIENT_CLINIC_OR_DEPARTMENT_OTHER)
Admission: RE | Admit: 2015-07-03 | Discharge: 2015-07-03 | Disposition: A | Discharge status: Home / Self Care | Payer: Medicare Other | Source: Ambulatory Visit | Attending: Internal Medicine | Admitting: Internal Medicine

## 2015-07-03 DIAGNOSIS — I10 Essential (primary) hypertension: Secondary | ICD-10-CM | POA: Diagnosis not present

## 2015-07-03 DIAGNOSIS — R0600 Dyspnea, unspecified: Secondary | ICD-10-CM | POA: Insufficient documentation

## 2015-07-03 DIAGNOSIS — E119 Type 2 diabetes mellitus without complications: Secondary | ICD-10-CM | POA: Diagnosis not present

## 2015-07-03 DIAGNOSIS — E785 Hyperlipidemia, unspecified: Secondary | ICD-10-CM | POA: Diagnosis not present

## 2015-07-08 ENCOUNTER — Telehealth: Payer: Self-pay

## 2015-07-08 MED ORDER — METOPROLOL TARTRATE 25 MG PO TABS: 25.0000 mg | ORAL_TABLET | Freq: Two times a day (BID) | ORAL | Status: DC

## 2015-07-08 NOTE — Telephone Encounter (Signed)
Patient agrees to try medication,rx e-scribed to pharmacy

## 2015-07-08 NOTE — Telephone Encounter (Signed)
-----   Message from Fay Records, MD sent at 07/08/2015  3:38 PM EDT ----- Normal heart rhythm  AVerage heart rate is 97 bpm  No significant arrhythmia Would recomm trying metoprolol 25 mg 2x per day  See how feels if slow down HR a little

## 2015-07-10 DIAGNOSIS — R000 Tachycardia, unspecified: Secondary | ICD-10-CM | POA: Diagnosis not present

## 2015-07-10 DIAGNOSIS — E669 Obesity, unspecified: Secondary | ICD-10-CM | POA: Diagnosis not present

## 2015-07-10 DIAGNOSIS — E119 Type 2 diabetes mellitus without complications: Secondary | ICD-10-CM | POA: Diagnosis not present

## 2015-07-10 LAB — CBC WITH DIFFERENTIAL/PLATELET
BASOS ABS: 0 10*3/uL (ref 0.0–0.1)
BASOS PCT: 0 % (ref 0–1)
EOS ABS: 0.1 10*3/uL (ref 0.0–0.7)
Eosinophils Relative: 1 % (ref 0–5)
HCT: 42.2 % (ref 36.0–46.0)
Hemoglobin: 13.3 g/dL (ref 12.0–15.0)
Lymphocytes Relative: 32 % (ref 12–46)
Lymphs Abs: 3.7 10*3/uL (ref 0.7–4.0)
MCH: 22.1 pg — ABNORMAL LOW (ref 26.0–34.0)
MCHC: 31.5 g/dL (ref 30.0–36.0)
MCV: 70 fL — ABNORMAL LOW (ref 78.0–100.0)
MPV: 10.3 fL (ref 8.6–12.4)
Monocytes Absolute: 0.5 10*3/uL (ref 0.1–1.0)
Monocytes Relative: 4 % (ref 3–12)
NEUTROS PCT: 63 % (ref 43–77)
Neutro Abs: 7.4 10*3/uL (ref 1.7–7.7)
PLATELETS: 331 10*3/uL (ref 150–400)
RBC: 6.03 MIL/uL — AB (ref 3.87–5.11)
RDW: 17.6 % — ABNORMAL HIGH (ref 11.5–15.5)
WBC: 11.7 10*3/uL — ABNORMAL HIGH (ref 4.0–10.5)

## 2015-07-11 LAB — TSH: TSH: 1.26 m[IU]/L

## 2015-07-11 LAB — HEMOGLOBIN A1C
Hgb A1c MFr Bld: 7 % — ABNORMAL HIGH (ref <5.7)
MEAN PLASMA GLUCOSE: 154 mg/dL

## 2015-07-15 ENCOUNTER — Encounter (INDEPENDENT_AMBULATORY_CARE_PROVIDER_SITE_OTHER): Payer: Self-pay | Admitting: *Deleted

## 2015-07-15 ENCOUNTER — Ambulatory Visit (INDEPENDENT_AMBULATORY_CARE_PROVIDER_SITE_OTHER): Payer: Medicare Other | Admitting: Family Medicine

## 2015-07-15 ENCOUNTER — Encounter: Payer: Self-pay | Admitting: Family Medicine

## 2015-07-15 ENCOUNTER — Other Ambulatory Visit: Payer: Self-pay | Admitting: Family Medicine

## 2015-07-15 VITALS — BP 122/80 | HR 97 | Resp 18 | Ht 70.0 in | Wt 378.0 lb

## 2015-07-15 DIAGNOSIS — I10 Essential (primary) hypertension: Secondary | ICD-10-CM

## 2015-07-15 DIAGNOSIS — E119 Type 2 diabetes mellitus without complications: Secondary | ICD-10-CM

## 2015-07-15 DIAGNOSIS — E669 Obesity, unspecified: Secondary | ICD-10-CM | POA: Diagnosis not present

## 2015-07-15 DIAGNOSIS — Z1211 Encounter for screening for malignant neoplasm of colon: Secondary | ICD-10-CM

## 2015-07-15 DIAGNOSIS — E1169 Type 2 diabetes mellitus with other specified complication: Secondary | ICD-10-CM

## 2015-07-15 DIAGNOSIS — E785 Hyperlipidemia, unspecified: Secondary | ICD-10-CM

## 2015-07-15 MED ORDER — ASPIRIN EC 81 MG PO TBEC: 81.0000 mg | DELAYED_RELEASE_TABLET | Freq: Every day | ORAL | Status: AC

## 2015-07-15 NOTE — Progress Notes (Signed)
Subjective:    Patient ID: Tracey [name], female    DOB: April 25, 1955, 60 y.o.   MRN: [id number]  HPI   Tracey [name]     MRN: [id number]      DOB: 1956/02/07   HPI Tracey [name] is here for follow up and re-evaluation of chronic medical conditions, medication management and review of any available recent lab and radiology data.  Preventive health is updated, specifically  Cancer screening and Immunization.   Questions or concerns regarding consultations or procedures which the PT has had in the interim are  addressed. The PT denies any adverse reactions to current medications since the last visit.  There are no new concerns.  There are no specific complaints  Denies polyuria, polydipsia, blurred vision , or hypoglycemic episodes.  ROS Denies recent fever or chills. Denies sinus pressure, nasal congestion, ear pain or sore throat. Denies chest congestion, productive cough or wheezing. Denies chest pains, palpitations and leg swelling Denies abdominal pain, nausea, vomiting,diarrhea or constipation.   Denies dysuria, frequency, hesitancy or incontinence. Denies joint pain, swelling and limitation in mobility. Denies headaches, seizures, numbness, or tingling. Denies depression, anxiety or insomnia. Denies skin break down or rash.   PE  BP 122/80 mmHg  Pulse 97  Resp 18  Ht 5\' 10"  (1.778 m)  Wt 378 lb (171.46 kg)  BMI 54.24 kg/m2  SpO2 98%  Patient alert and oriented and in no cardiopulmonary distress.  HEENT: No facial asymmetry, EOMI,   oropharynx pink and moist.  Neck supple no JVD, no mass.  Chest: Clear to auscultation bilaterally.  CVS: S1, S2 no murmurs, no S3.Regular rate.  ABD: Soft non tender.   Ext: No edema  MS: Adequate ROM spine, shoulders, hips and knees.  Skin: Intact, no ulcerations or rash noted.  Psych: Good eye contact, normal affect. Memory intact not anxious or depressed appearing.  CNS: CN 2-12 intact, power,  normal throughout.no  focal deficits noted.   Assessment & Plan   Hypertension Controlled, no change in medication DASH diet and commitment to daily physical activity for a minimum of 30 minutes discussed and encouraged, as a part of hypertension management. The importance of attaining a healthy weight is also discussed.  BP/Weight 07/18/2015 07/15/2015 06/24/2015 06/10/2015 05/13/2015 05/09/2015 0000000  Systolic BP - 123XX123 0000000 - 0000000 - [id number]  Diastolic BP - 80 70 - 82 - 82  Wt. (Lbs) 377 378 377 381.4 380.12 382 390  BMI 55.65 54.24 54.09 56.3 54.54 56.39 56.34        Diabetes mellitus type 2 in obese (HCC) Markedly improved and controlled, pt applauded on this Reduce testing frequency to once daily Tracey [name] is reminded of the importance of commitment to daily physical activity for 30 minutes or more, as able and the need to limit carbohydrate intake to 30 to 60 grams per meal to help with blood sugar control.   The need to take medication as prescribed, test blood sugar as directed, and to call between visits if there is a concern that blood sugar is uncontrolled is also discussed.   Tracey [name] is reminded of the importance of daily foot exam, annual eye examination, and good blood sugar, blood pressure and cholesterol control.  Diabetic Labs Latest Ref Rng 07/10/2015 04/10/2015 04/01/2015 11/17/2013 06/21/2013  HbA1c <5.7 % 7.0(H) - 12.8(H) - -  Microalbumin Not estab mg/dL - 6.9 - - -  Micro/Creat Ratio <30 mcg/mg creat - 28 - - -  Chol 125 -  200 mg/dL - - 190 168 -  HDL >=46 mg/dL - - 37(L) 41 -  Calc LDL <130 mg/dL - - 129 102(H) -  Triglycerides <150 mg/dL - - 121 126 -  Creatinine 0.50 - 1.05 mg/dL - - 0.68 0.76 0.63   BP/Weight 07/18/2015 07/15/2015 06/24/2015 06/10/2015 05/13/2015 05/09/2015 0000000  Systolic BP - 123XX123 0000000 - 0000000 - [id number]  Diastolic BP - 80 70 - 82 - 82  Wt. (Lbs) 377 378 377 381.4 380.12 382 390  BMI 55.65 54.24 54.09 56.3 54.54 56.39 56.34   Foot/eye exam completion dates 05/13/2015   Foot Form Completion Done         Hyperlipidemia LDL goal <100 Hyperlipidemia:Low fat diet discussed and encouraged.   Lipid Panel  Lab Results  Component Value Date   CHOL 190 04/01/2015   HDL 37* 04/01/2015   LDLCALC 129 04/01/2015   TRIG 121 04/01/2015   CHOLHDL 5.1* 04/01/2015   Updated lab needed at/ before next visit.       Morbid obesity Unchanged.. Patient re-educated about  the importance of commitment to a  minimum of 150 minutes of exercise per week.  The importance of healthy food choices with portion control discussed. Encouraged to start a food diary, count calories and to consider  joining a support group. Sample diet sheets offered. Goals set by the patient for the next several months.   Weight /BMI 07/18/2015 07/15/2015 06/24/2015  WEIGHT 377 lb 378 lb 377 lb  HEIGHT 5\' 9"  5\' 10"  5\' 10"   BMI 55.65 kg/m2 54.24 kg/m2 54.09 kg/m2    Current exercise per week 30 minutes.        Review of Systems     Objective:   Physical Exam        Assessment & Plan:

## 2015-07-15 NOTE — Patient Instructions (Addendum)
Initial wellness visit in 4 month, call if you need me sooner  CONGRATS excellent blood sugar  New is aspirin  81 mg daily to reduce stroke risk  You are referred for eye exam and colonoscopy  Fasting lipid cmp and EGFr and hBA1C in 4 month  REDUCE testing to ONCE daily  Please work on good  health habits so that your health will improve. 1. Commitment to daily physical activity for 30 to 60  minutes, if you are able to do this.  2. Commitment to wise food choices. Aim for half of your  food intake to be vegetable and fruit, one quarter starchy foods, and one quarter protein. Try to eat on a regular schedule  3 meals per day, snacking between meals should be limited to vegetables or fruits or small portions of nuts. 64 ounces of water per day is generally recommended, unless you have specific health conditions, like heart failure or kidney failure where you will need to limit fluid intake.  3. Commitment to sufficient and a  good quality of physical and mental rest daily, generally between 6 to 8 hours per day.  WITH PERSISTANCE AND PERSEVERANCE, THE IMPOSSIBLE , BECOMES THE NORM! Thank you  for choosing [facility]. We consider it a privelige to serve you.  Delivering excellent health care in a caring and  compassionate way is our goal.  Partnering with you,  so that together we can achieve this goal is our strategy.   Marland Kitchen

## 2015-07-17 NOTE — Addendum Note (Signed)
Addended by: Debbora Lacrosse R on: 07/17/2015 03:20 PM   Modules accepted: Orders

## 2015-07-18 ENCOUNTER — Encounter: Payer: Self-pay | Admitting: Nutrition

## 2015-07-18 ENCOUNTER — Encounter: Payer: Medicare Other | Attending: Family Medicine | Admitting: Nutrition

## 2015-07-18 VITALS — Ht 69.0 in | Wt 377.0 lb

## 2015-07-18 DIAGNOSIS — E1165 Type 2 diabetes mellitus with hyperglycemia: Secondary | ICD-10-CM | POA: Diagnosis not present

## 2015-07-18 DIAGNOSIS — E118 Type 2 diabetes mellitus with unspecified complications: Secondary | ICD-10-CM | POA: Diagnosis not present

## 2015-07-18 DIAGNOSIS — IMO0002 Reserved for concepts with insufficient information to code with codable children: Secondary | ICD-10-CM

## 2015-07-18 NOTE — Patient Instructions (Signed)
Goal: Keep up the GREAT JOB!!! Increase physical activity to 30 minutes 5 days per week. Increase low carb vegetables. No snacks between meals. Get A1C less than 7% in three months.

## 2015-07-18 NOTE — Progress Notes (Signed)
Diabetes Self-Management Education  Visit Type:  Follow-up  Appt. Start Time: 900 Appt. End Time: 930  07/18/2015  [name], identified by name and date of birth, is a 60 y.o. female with a diagnosis of Diabetes: Type 2.  She notes she feels a lot better. BS doing much better. AVG BS 109 mg/dl. Doing chair exercises.  Lost 5 lbss. A1C down from 12.8 to 7%.  She feels a lot better!  ASSESSMENT  Height 5\' 9"  (1.753 m), weight 377 lb (171.006 kg). Body mass index is 55.65 kg/(m^2).       Diabetes Self-Management Education - 07/18/15 0906    Health Coping   How would you rate your overall health? Good   Psychosocial Assessment   Patient Belief/Attitude about Diabetes Motivated to manage diabetes   Self-care barriers None   Self-management support Doctor's office;Family;Friends   Patient Concerns Nutrition/Meal planning;Healthy Lifestyle;Weight Control   Special Needs None   Preferred Learning Style No preference indicated   Learning Readiness Change in progress   Complications   Last HgB A1C per patient/outside source 7 %   How often do you check your blood sugar? 1-2 times/day   Fasting Blood glucose range (mg/dL) 70-129   Number of hypoglycemic episodes per month 0   Can you tell when your blood sugar is low? Yes   What do you do if your blood sugar is low? eat fruit or juice   Number of hyperglycemic episodes per week 0   Can you tell when your blood sugar is high? No   Have you had a dilated eye exam in the past 12 months? No   Have you had a dental exam in the past 12 months? No   Are you checking your feet? Yes   How many days per week are you checking your feet? 7   Dietary Intake   Breakfast Sandwich-turkey with cheese and mustartd, water, lemonade.   Snack (morning) none usuallly   Lunch chicken nuggets, fries,water and sweet tea   Snack (afternoon) none but sometimes fruit or sugar free.   Dinner [employer] with [place] with catalina., water,   Snack  (evening) popscycle.   Beverage(s) water, lemonade,   Exercise   Exercise Type Light (walking / raking leaves)   How many days per week to you exercise? 3   How many minutes per day do you exercise? 0   Total minutes per week of exercise 0   Patient Education   Previous Diabetes Education No   Disease state  Explored patient's options for treatment of their diabetes   Nutrition management  Carbohydrate counting;Meal timing in regards to the patients' current diabetes medication.   Monitoring Taught/discussed recording of test results and interpretation of SMBG.   Acute complications Covered sick day management with medication and food.   Psychosocial adjustment Worked with patient to identify barriers to care and solutions;Helped patient identify a support system for diabetes management   Personal strategies to promote health Lifestyle issues that need to be addressed for better diabetes care;Helped patient develop diabetes management plan for (enter comment)   Individualized Goals (developed by patient)   Nutrition Follow meal plan discussed;General guidelines for healthy choices and portions discussed   Physical Activity Exercise 3-5 times per week   Medications take my medication as prescribed   Monitoring  test blood glucose pre and post meals as discussed   Reducing Risk examine blood glucose patterns;do foot checks daily   Patient Self-Evaluation of Goals -  Patient rates self as meeting previously set goals (% of time)   Nutrition >75%   Physical Activity >75%   Medications >75%   Monitoring >75%   Problem Solving >75%   Reducing Risk >75%   Health Coping >75%   Outcomes   Program Status Completed   Subsequent Visit   Since your last visit have you continued or begun to take your medications as prescribed? Yes   Since your last visit have you had your blood pressure checked? Yes   Is your most recent blood pressure lower, unchanged, or higher since your last visit? Lower    Since your last visit have you experienced any weight changes? Loss   Weight Loss (lbs) 5   Since your last visit, are you checking your blood glucose at least once a day? Yes      Learning Objective:  Patient will have a greater understanding of diabetes self-management. Patient education plan is to attend individual and/or group sessions per assessed needs and concerns.   Plan:   Goal: Keep up the GREAT JOB!!! Increase physical activity to 30 minutes 5 days per week. Increase low carb vegetables. No snacks between meals. Get A1C less than 7% in three months.  Expected Outcomes:   Improved blood sugars and weight loss.  Education material provided: Food label handouts, My Plate and No sodium seasonings  If problems or questions, patient to contact team via:  Phone and Email  Future DSME appointment: - 2 months

## 2015-07-21 NOTE — Assessment & Plan Note (Signed)
Hyperlipidemia:Low fat diet discussed and encouraged.   Lipid Panel  Lab Results  Component Value Date   CHOL 190 04/01/2015   HDL 37* 04/01/2015   LDLCALC 129 04/01/2015   TRIG 121 04/01/2015   CHOLHDL 5.1* 04/01/2015   Updated lab needed at/ before next visit.

## 2015-07-21 NOTE — Assessment & Plan Note (Signed)
Controlled, no change in medication DASH diet and commitment to daily physical activity for a minimum of 30 minutes discussed and encouraged, as a part of hypertension management. The importance of attaining a healthy weight is also discussed.  BP/Weight 07/18/2015 07/15/2015 06/24/2015 06/10/2015 05/13/2015 05/09/2015 0000000  Systolic BP - 123XX123 0000000 - 0000000 - [id number]  Diastolic BP - 80 70 - 82 - 82  Wt. (Lbs) 377 378 377 381.4 380.12 382 390  BMI 55.65 54.24 54.09 56.3 54.54 56.39 56.34

## 2015-07-21 NOTE — Assessment & Plan Note (Signed)
Unchanged.. Patient re-educated about  the importance of commitment to a  minimum of 150 minutes of exercise per week.  The importance of healthy food choices with portion control discussed. Encouraged to start a food diary, count calories and to consider  joining a support group. Sample diet sheets offered. Goals set by the patient for the next several months.   Weight /BMI 07/18/2015 07/15/2015 06/24/2015  WEIGHT 377 lb 378 lb 377 lb  HEIGHT 5\' 9"  5\' 10"  5\' 10"   BMI 55.65 kg/m2 54.24 kg/m2 54.09 kg/m2    Current exercise per week 30 minutes.

## 2015-07-21 NOTE — Assessment & Plan Note (Signed)
Markedly improved and controlled, pt applauded on this Reduce testing frequency to once daily Tracey [name] is reminded of the importance of commitment to daily physical activity for 30 minutes or more, as able and the need to limit carbohydrate intake to 30 to 60 grams per meal to help with blood sugar control.   The need to take medication as prescribed, test blood sugar as directed, and to call between visits if there is a concern that blood sugar is uncontrolled is also discussed.   Tracey [name] is reminded of the importance of daily foot exam, annual eye examination, and good blood sugar, blood pressure and cholesterol control.  Diabetic Labs Latest Ref Rng 07/10/2015 04/10/2015 04/01/2015 11/17/2013 06/21/2013  HbA1c <5.7 % 7.0(H) - 12.8(H) - -  Microalbumin Not estab mg/dL - 6.9 - - -  Micro/Creat Ratio <30 mcg/mg creat - 28 - - -  Chol 125 - 200 mg/dL - - 190 168 -  HDL >=46 mg/dL - - 37(L) 41 -  Calc LDL <130 mg/dL - - 129 102(H) -  Triglycerides <150 mg/dL - - 121 126 -  Creatinine 0.50 - 1.05 mg/dL - - 0.68 0.76 0.63   BP/Weight 07/18/2015 07/15/2015 06/24/2015 06/10/2015 05/13/2015 05/09/2015 0000000  Systolic BP - 123XX123 0000000 - 0000000 - [id number]  Diastolic BP - 80 70 - 82 - 82  Wt. (Lbs) 377 378 377 381.4 380.12 382 390  BMI 55.65 54.24 54.09 56.3 54.54 56.39 56.34   Foot/eye exam completion dates 05/13/2015  Foot Form Completion Done

## 2015-07-31 DIAGNOSIS — E119 Type 2 diabetes mellitus without complications: Secondary | ICD-10-CM | POA: Diagnosis not present

## 2015-07-31 DIAGNOSIS — H0260 Xanthelasma of unspecified eye, unspecified eyelid: Secondary | ICD-10-CM | POA: Diagnosis not present

## 2015-08-01 ENCOUNTER — Other Ambulatory Visit (INDEPENDENT_AMBULATORY_CARE_PROVIDER_SITE_OTHER): Payer: Self-pay | Admitting: *Deleted

## 2015-08-01 DIAGNOSIS — Z1211 Encounter for screening for malignant neoplasm of colon: Secondary | ICD-10-CM

## 2015-08-03 ENCOUNTER — Other Ambulatory Visit: Payer: Self-pay | Admitting: Internal Medicine

## 2015-08-08 ENCOUNTER — Other Ambulatory Visit: Payer: Self-pay

## 2015-08-08 MED ORDER — OMEPRAZOLE 20 MG PO CPDR: DELAYED_RELEASE_CAPSULE | ORAL | Status: DC

## 2015-08-12 ENCOUNTER — Other Ambulatory Visit (HOSPITAL_COMMUNITY): Payer: Self-pay | Admitting: Respiratory Therapy

## 2015-08-12 DIAGNOSIS — I10 Essential (primary) hypertension: Secondary | ICD-10-CM

## 2015-08-12 DIAGNOSIS — G4733 Obstructive sleep apnea (adult) (pediatric): Secondary | ICD-10-CM

## 2015-08-14 ENCOUNTER — Other Ambulatory Visit (HOSPITAL_COMMUNITY): Payer: Self-pay | Admitting: Respiratory Therapy

## 2015-08-14 DIAGNOSIS — D649 Anemia, unspecified: Secondary | ICD-10-CM | POA: Diagnosis not present

## 2015-08-15 ENCOUNTER — Other Ambulatory Visit: Payer: Self-pay | Admitting: Internal Medicine

## 2015-08-15 LAB — FERRITIN: Ferritin: 158 ng/mL (ref 10–232)

## 2015-08-29 ENCOUNTER — Telehealth: Payer: Self-pay | Admitting: Internal Medicine

## 2015-08-29 NOTE — Telephone Encounter (Signed)
Patient informed of results. Forwarded to Dr. Moshe Cipro per her request.

## 2015-08-29 NOTE — Telephone Encounter (Signed)
Follow-up     The pt was calling cause someone from our office called and did not leave a detail message. Pt uncertain of what the call is about.

## 2015-09-20 ENCOUNTER — Other Ambulatory Visit (INDEPENDENT_AMBULATORY_CARE_PROVIDER_SITE_OTHER): Payer: Self-pay | Admitting: *Deleted

## 2015-09-20 ENCOUNTER — Encounter (INDEPENDENT_AMBULATORY_CARE_PROVIDER_SITE_OTHER): Payer: Self-pay | Admitting: *Deleted

## 2015-09-20 NOTE — Telephone Encounter (Signed)
Patient needs trilyte 

## 2015-09-23 MED ORDER — PEG 3350-KCL-NA BICARB-NACL 420 G PO SOLR: 4000.0000 mL | Freq: Once | ORAL | Status: DC

## 2015-09-30 ENCOUNTER — Ambulatory Visit: Payer: Medicare Other | Admitting: Nutrition

## 2015-10-02 ENCOUNTER — Telehealth (INDEPENDENT_AMBULATORY_CARE_PROVIDER_SITE_OTHER): Payer: Self-pay | Admitting: *Deleted

## 2015-10-02 ENCOUNTER — Encounter: Payer: Medicare Other | Attending: Family Medicine | Admitting: Nutrition

## 2015-10-02 DIAGNOSIS — E1165 Type 2 diabetes mellitus with hyperglycemia: Secondary | ICD-10-CM | POA: Insufficient documentation

## 2015-10-02 DIAGNOSIS — E118 Type 2 diabetes mellitus with unspecified complications: Secondary | ICD-10-CM | POA: Insufficient documentation

## 2015-10-02 NOTE — Patient Instructions (Signed)
Goals 1. Cut out sugared cereals 2. Choose raisin bran cereal or cheerrios instead of sugared cereal. 3. Increase physical activity-- walk and exercise 30 minutes daily. 4. Add to protein with meals. 5. Eat fruit with meals instead of between meals. 6. Lose 1-2 lb per week. 7. Get A1C to 7% or below

## 2015-10-02 NOTE — Telephone Encounter (Signed)
Referring MD/PCP: simpson   Procedure: tcs  Reason/Indication:  screening  Has patient had this procedure before?  no  If so, when, by whom and where?    Is there a family history of colon cancer?  no  Who?  What age when diagnosed?    Is patient diabetic?   yes      Does patient have prosthetic heart valve or mechanical valve?  no  Do you have a pacemaker?  no  Has patient ever had endocarditis? no  Has patient had joint replacement within last 12 months?  no  Does patient tend to be constipated or take laxatives? no  Does patient have a history of alcohol/drug use?  no  Is patient on Coumadin, Plavix and/or Aspirin? no  Medications: see epic  Allergies: haloperidol  Medication Adjustment: hold DM meds evening before & morning of procedure  Procedure date & time: 10/31/15 at 930

## 2015-10-02 NOTE — Progress Notes (Signed)
.       Diabetes Self-Management Education  Visit Type:     Appt. Start Time: 900 Appt. End Time: 930  10/02/2015   Meter brought in   Breakfast: Breakfast; Cinnamon toast crunch and 15  Milk 1 cup, 1 cup water Snack: fruit or banana Lunch; Toss salad with [employer] dressing with vinegar, [place], cheese, cukes, tomatoes, and onions and water No snack Dinner:  Left over salad, water   [name], identified by name and date of birth, is a 60 y.o. female with a diagnosis of Diabetes:  .  She notes she feels a lot better. BS doing much better. AVG BS 109 mg/dl. Doing chair exercises.  Lost 5 lbss. A1C down from 12.8 to 7%.  She feels a lot better!  ASSESSMENT  Height 5\' 10"  (1.778 m), weight 375 lb 12.8 oz (170.462 kg). Body mass index is 53.92 kg/(m^2).     Learning Objective:  Patient will have a greater understanding of diabetes self-management. Patient education plan is to attend individual and/or group sessions per assessed needs and concerns.   Plan:   Goal: Keep up the GREAT JOB!!! Increase physical activity to 30 minutes 5 days per week. Increase low carb vegetables. No snacks between meals. Get A1C less than 7% in three months.  Expected Outcomes:   Improved blood sugars and weight loss.  Education material provided: Food label handouts, My Plate and No sodium seasonings  If problems or questions, patient to contact team via:  Phone and Email  Future DSME appointment: -

## 2015-10-07 ENCOUNTER — Other Ambulatory Visit: Payer: Self-pay | Admitting: Family Medicine

## 2015-10-09 NOTE — Telephone Encounter (Signed)
agree

## 2015-10-26 ENCOUNTER — Other Ambulatory Visit: Payer: Self-pay | Admitting: Family Medicine

## 2015-10-31 ENCOUNTER — Encounter (HOSPITAL_COMMUNITY): Payer: Self-pay | Admitting: *Deleted

## 2015-10-31 ENCOUNTER — Encounter (HOSPITAL_COMMUNITY)
Admission: RE | Disposition: A | Discharge status: Home / Self Care | Payer: Self-pay | Source: Ambulatory Visit | Attending: Internal Medicine

## 2015-10-31 ENCOUNTER — Ambulatory Visit (HOSPITAL_COMMUNITY)
Admission: RE | Admit: 2015-10-31 | Discharge: 2015-10-31 | Disposition: A | Discharge status: Home / Self Care | Payer: Medicare Other | Source: Ambulatory Visit | Attending: Internal Medicine | Admitting: Internal Medicine

## 2015-10-31 DIAGNOSIS — Z86711 Personal history of pulmonary embolism: Secondary | ICD-10-CM | POA: Diagnosis not present

## 2015-10-31 DIAGNOSIS — E119 Type 2 diabetes mellitus without complications: Secondary | ICD-10-CM | POA: Diagnosis not present

## 2015-10-31 DIAGNOSIS — E785 Hyperlipidemia, unspecified: Secondary | ICD-10-CM | POA: Insufficient documentation

## 2015-10-31 DIAGNOSIS — Z7982 Long term (current) use of aspirin: Secondary | ICD-10-CM | POA: Diagnosis not present

## 2015-10-31 DIAGNOSIS — K219 Gastro-esophageal reflux disease without esophagitis: Secondary | ICD-10-CM | POA: Diagnosis not present

## 2015-10-31 DIAGNOSIS — I10 Essential (primary) hypertension: Secondary | ICD-10-CM | POA: Insufficient documentation

## 2015-10-31 DIAGNOSIS — K644 Residual hemorrhoidal skin tags: Secondary | ICD-10-CM | POA: Insufficient documentation

## 2015-10-31 DIAGNOSIS — Z6841 Body Mass Index (BMI) 40.0 and over, adult: Secondary | ICD-10-CM | POA: Insufficient documentation

## 2015-10-31 DIAGNOSIS — Z1211 Encounter for screening for malignant neoplasm of colon: Secondary | ICD-10-CM | POA: Insufficient documentation

## 2015-10-31 DIAGNOSIS — Q438 Other specified congenital malformations of intestine: Secondary | ICD-10-CM | POA: Diagnosis not present

## 2015-10-31 HISTORY — DX: Type 2 diabetes mellitus without complications: E11.9

## 2015-10-31 HISTORY — PX: COLONOSCOPY: SHX5424

## 2015-10-31 LAB — GLUCOSE, CAPILLARY: Glucose-Capillary: 181 mg/dL — ABNORMAL HIGH (ref 65–99)

## 2015-10-31 SURGERY — COLONOSCOPY
Anesthesia: Moderate Sedation

## 2015-10-31 MED ORDER — MIDAZOLAM HCL 5 MG/5ML IJ SOLN
INTRAMUSCULAR | Status: DC | PRN
  Administered 2015-10-31 (×4): 2 mg via INTRAVENOUS

## 2015-10-31 MED ORDER — SODIUM CHLORIDE 0.9 % IV SOLN
INTRAVENOUS | Status: DC
  Administered 2015-10-31: 09:00:00 via INTRAVENOUS

## 2015-10-31 MED ORDER — SIMETHICONE 40 MG/0.6ML PO SUSP
ORAL | Status: DC | PRN
  Administered 2015-10-31: 10:00:00

## 2015-10-31 MED ORDER — MIDAZOLAM HCL 5 MG/5ML IJ SOLN
INTRAMUSCULAR | Status: AC
  Filled 2015-10-31: qty 10

## 2015-10-31 MED ORDER — MEPERIDINE HCL 50 MG/ML IJ SOLN
INTRAMUSCULAR | Status: DC | PRN
  Administered 2015-10-31 (×2): 25 mg via INTRAVENOUS

## 2015-10-31 MED ORDER — MEPERIDINE HCL 50 MG/ML IJ SOLN
INTRAMUSCULAR | Status: AC
  Filled 2015-10-31: qty 1

## 2015-10-31 NOTE — Progress Notes (Signed)
This note also relates to the following rows which could not be included: SpO2 - Cannot attach notes to unvalidated device data End Tidal CO2 (EtCO2) - Cannot attach notes to unvalidated device data     10/31/15 0955 10/31/15 1000  Oxygen Therapy  SpO2 Alarm Limit Low 92 92

## 2015-10-31 NOTE — Discharge Instructions (Signed)
Resume usual medications and diet. No driving for 24 hours. Will schedule virtual colonoscopy in one month.  Colonoscopy, Care After These instructions give you information on caring for yourself after your procedure. Your doctor may also give you more specific instructions. Call your doctor if you have any problems or questions after your procedure. HOME CARE  Do not drive for 24 hours.  Do not sign important papers or use machinery for 24 hours.  You may shower.  You may go back to your usual activities, but go slower for the first 24 hours.  Take rest breaks often during the first 24 hours.  Walk around or use warm packs on your belly (abdomen) if you have belly cramping or gas.  Drink enough fluids to keep your pee (urine) clear or pale yellow.  Resume your normal diet. Avoid heavy or fried foods.  Avoid drinking alcohol for 24 hours or as told by your doctor.  Only take medicines as told by your doctor. If a tissue sample (biopsy) was taken during the procedure:   Do not take aspirin or blood thinners for 7 days, or as told by your doctor.  Do not drink alcohol for 7 days, or as told by your doctor.  Eat soft foods for the first 24 hours. GET HELP IF: You still have a small amount of blood in your poop (stool) 2-3 days after the procedure. GET HELP RIGHT AWAY IF:  You have more than a small amount of blood in your poop.  You see clumps of tissue (blood clots) in your poop.  Your belly is puffy (swollen).  You feel sick to your stomach (nauseous) or throw up (vomit).  You have a fever.  You have belly pain that gets worse and medicine does not help. MAKE SURE YOU:  Understand these instructions.  Will watch your condition.  Will get help right away if you are not doing well or get worse.   This information is not intended to replace advice given to you by your health care provider. Make sure you discuss any questions you have with your health care  provider.   Document Released: 05/02/2010 Document Revised: 04/04/2013 Document Reviewed: 12/05/2012 Elsevier Interactive Patient Education [employer].

## 2015-10-31 NOTE — Op Note (Signed)
[facility] Patient Name: Tracey [name] Procedure Date: 10/31/2015 9:25 AM MRN: [id number] Date of Birth: 11-18-1955 Attending MD: Hildred Laser , MD CSN: [id number] Age: 60 Admit Type: Outpatient Procedure:                Colonoscopy Indications:              Screening for colorectal malignant neoplasm Providers:                Hildred Laser, MD, Otis Peak B. Sharon Seller, RN, Isabella Stalling, Technician Referring MD:             [place] Levo. Simpson MD, MD Medicines:                Meperidine 50 mg IV, Midazolam 8 mg IV Complications:            No immediate complications. Estimated Blood Loss:     Estimated blood loss: none. Procedure:                Pre-Anesthesia Assessment:                           - Prior to the procedure, a History and Physical                            was performed, and patient medications and                            allergies were reviewed. The patient's tolerance of                            previous anesthesia was also reviewed. The risks                            and benefits of the procedure and the sedation                            options and risks were discussed with the patient.                            All questions were answered, and informed consent                            was obtained. Prior Anticoagulants: The patient                            last took aspirin 2 days prior to the procedure.                            ASA Grade Assessment: III - A patient with severe                            systemic disease. After reviewing the risks and  benefits, the patient was deemed in satisfactory                            condition to undergo the procedure.                           After obtaining informed consent, the colonoscope                            was passed under direct vision. Throughout the                            procedure, the patient's blood pressure, pulse, and                            oxygen saturations were monitored continuously. The                            [id number]) scope was introduced through                            the anus and advanced to the the ileocecal valve.                            The colonoscopy was technically difficult and                            complex due to significant looping, a tortuous                            colon and the patient's body habitus. Completion of                            the procedure was aided by changing the patient's                            position, withdrawing and reinserting the scope and                            applying abdominal pressure but cecum not deeply                            intubated. The patient tolerated the procedure                            well. The quality of the bowel preparation was                            excellent. The ileocecal valve and the rectum were                            photographed. Scope In: 9:36:31 AM Scope Out: 10:22:41 AM Total Procedure Duration: 0 hours 46 minutes 10 seconds  Findings:  The rectum, recto-sigmoid colon, sigmoid colon, descending colon,       splenic flexure, transverse colon, hepatic flexurenA descending colon       are normal.      Blunt of cecum and appendiceal orifice not seen.      External hemorrhoids were found during retroflexion. The hemorrhoids       were small. Impression:               - The rectum, recto-sigmoid colon, sigmoid colon,                            descending colon, splenic flexure, transverse                            colon, hepatic flexure and ascending colon.                           - Ileocecal valve well seen but blunt end of the                            cecum was not.                           - External hemorrhoids.                           - No specimens collected. Moderate Sedation:      Moderate (conscious) sedation was administered by the endoscopy nurse        and supervised by the endoscopist. The following parameters were       monitored: oxygen saturation, heart rate, blood pressure, CO2       capnography and response to care. Total physician intraservice time was       52 minutes. Recommendation:           - Patient has a contact number available for                            emergencies. The signs and symptoms of potential                            delayed complications were discussed with the                            patient. Return to normal activities tomorrow.                            Written discharge instructions were provided to the                            patient.                           - Resume previous diet today.                           - Continue present medications.                           -  Perform a virtual colonoscopy in 1 month.                           - Repeat colonoscopy in 10 years for screening                            purposes. Procedure Code(s):        --- Professional ---                           [phone], Colonoscopy, flexible; diagnostic, including                            collection of specimen(s) by brushing or washing,                            when performed (separate procedure)                           99152, Moderate sedation services provided by the                            same physician or other qualified health care                            professional performing the diagnostic or                            therapeutic service that the sedation supports,                            requiring the presence of an independent trained                            observer to assist in the monitoring of the                            patient's level of consciousness and physiological                            status; initial 15 minutes of intraservice time,                            patient age 69 years or older                           [phone], Moderate sedation services; each  additional                            15 minutes intraservice time                           99153, Moderate sedation services; each additional  15 minutes intraservice time Diagnosis Code(s):        --- Professional ---                           Z12.11, Encounter for screening for malignant                            neoplasm of colon                           K64.4, Residual hemorrhoidal skin tags CPT copyright 2016 American Medical Association. All rights reserved. The codes documented in this report are preliminary and upon coder review may  be revised to meet current compliance requirements. Hildred Laser, MD Hildred Laser, MD 10/31/2015 10:37:14 AM This report has been signed electronically. Number of Addenda: 0

## 2015-10-31 NOTE — H&P (Signed)
Tracey [name] is an 60 y.o. female.   Chief Complaint: Patient is here for colonoscopy. HPI: Patient is 60 year old African-American female was here for screening colonoscopy. She denies abdominal pain change in bowel habits or rectal bleeding. This is patient's first exam. Family history is negative for CRC.  Past Medical History  Diagnosis Date  . PE (pulmonary embolism)   . Diastolic dysfunction [id number]    Grade 1  . Morbid obesity ([place])   . Hyperlipidemia   . Pulmonary embolism ([place])   . GERD (gastroesophageal reflux disease)   . Hyperlipidemia   . Anginal pain ([place])   . Arthritis   . Hypertension 04/15/2012  . Diabetes mellitus without complication [facility])     Past Surgical History  Procedure Laterality Date  . Cesarean section    . Cholecystectomy  1996  . Abdominal hysterectomy  1999    fibroids    Family History  Problem Relation Age of Onset  . Diabetes Mother   . Hypertension Mother   . Hyperlipidemia Father   . Hypertension Father   . Alcohol abuse Father   . Aneurysm Brother   . Heart disease Sister 8    cad   Social History:  reports that she has never smoked. She does not have any smokeless tobacco history on file. She reports that she does not drink alcohol or use illicit drugs.  Allergies:  Allergies  Allergen Reactions  . Haloperidol Lactate Anaphylaxis    Medications Prior to Admission  Medication Sig Dispense Refill  . amLODipine (NORVASC) 2.5 MG tablet TAKE ONE (1) TABLET EACH DAY 30 tablet 3  . atorvastatin (LIPITOR) 40 MG tablet Take 1 tablet (40 mg total) by mouth daily. 90 tablet 3  . GLIPIZIDE XL 5 MG 24 hr tablet TAKE ONE (1) TABLET EACH DAY WITH BREAKFAST 30 tablet 2  . INVOKANA 300 MG TABS tablet TAKE ONE (1) TABLET EACH DAY BEFORE BREAKFAST 30 tablet 3  . JANUMET 50-1000 MG tablet TAKE ONE TABLET TWICE DAILY 60 tablet 2  . metoprolol tartrate (LOPRESSOR) 25 MG tablet Take 1 tablet (25 mg total) by mouth 2 (two) times daily. 60  tablet 6  . omeprazole (PRILOSEC) 20 MG capsule TAKE ONE (1) CAPSULE EACH DAY 30 capsule 2  . polyethylene glycol-electrolytes (TRILYTE) 420 g solution Take 4,000 mLs by mouth once. 4000 mL 0  . spironolactone (ALDACTONE) 25 MG tablet TAKE ONE (1) TABLET BY MOUTH EVERY DAY 30 tablet 3  . ACCU-CHEK SOFTCLIX LANCETS lancets Use as instructed twice daily dx E11.65 100 each 5  . aspirin EC 81 MG tablet Take 1 tablet (81 mg total) by mouth daily. (Patient not taking: Reported on 10/28/2015) 30 tablet 5  . glucose blood (ACCU-CHEK AVIVA) test strip Use as instructed twice daily dx E11.65 100 each 5  . ibuprofen (ADVIL,MOTRIN) 200 MG tablet Take 200 mg by mouth every 8 (eight) hours as needed for moderate pain.     . traMADol (ULTRAM) 50 MG tablet Take 1 tablet (50 mg total) by mouth 2 (two) times daily. (Patient not taking: Reported on 10/28/2015) 60 tablet 1    Results for orders placed or performed during the hospital encounter of 10/31/15 (from the past 48 hour(s))  Glucose, capillary     Status: Abnormal   Collection Time: 10/31/15  9:01 AM  Result Value Ref Range   Glucose-Capillary 181 (H) 65 - 99 mg/dL   No results found.  ROS  Blood pressure 122/64, pulse 114, temperature  98.7 F (37.1 C), temperature source Oral, resp. rate 18, height 5\' 10"  (1.778 m), weight 370 lb (167.831 kg), SpO2 99 %. Physical Exam  Constitutional:  Well-developed obese African-American female in NAD.  HENT:  Mouth/Throat: Oropharynx is clear and moist.  Eyes: Conjunctivae are normal. No scleral icterus.  Neck: No thyromegaly present.  Cardiovascular: Normal rate, regular rhythm and normal heart sounds.   No murmur heard. Respiratory: Effort normal and breath sounds normal.  GI:  Full abdomen. Soft and nontender without organomegaly or masses.  Musculoskeletal: She exhibits no edema.  Lymphadenopathy:    She has no cervical adenopathy.  Neurological: She is alert.  Skin: Skin is warm and dry.      Assessment/Plan Average risk screening colonoscopy.  Hildred Laser, MD 10/31/2015, 9:25 AM

## 2015-11-01 ENCOUNTER — Other Ambulatory Visit (INDEPENDENT_AMBULATORY_CARE_PROVIDER_SITE_OTHER): Payer: Self-pay | Admitting: Internal Medicine

## 2015-11-01 ENCOUNTER — Encounter (HOSPITAL_COMMUNITY): Payer: Self-pay | Admitting: Internal Medicine

## 2015-11-01 DIAGNOSIS — K562 Volvulus: Secondary | ICD-10-CM

## 2015-11-05 ENCOUNTER — Ambulatory Visit: Payer: Medicare Other | Attending: Neurology | Admitting: Neurology

## 2015-11-05 DIAGNOSIS — I10 Essential (primary) hypertension: Secondary | ICD-10-CM | POA: Diagnosis not present

## 2015-11-05 DIAGNOSIS — E119 Type 2 diabetes mellitus without complications: Secondary | ICD-10-CM | POA: Diagnosis not present

## 2015-11-05 DIAGNOSIS — Z7982 Long term (current) use of aspirin: Secondary | ICD-10-CM | POA: Diagnosis not present

## 2015-11-05 DIAGNOSIS — Z79899 Other long term (current) drug therapy: Secondary | ICD-10-CM | POA: Insufficient documentation

## 2015-11-05 DIAGNOSIS — R0683 Snoring: Secondary | ICD-10-CM | POA: Diagnosis not present

## 2015-11-05 DIAGNOSIS — Z6841 Body Mass Index (BMI) 40.0 and over, adult: Secondary | ICD-10-CM | POA: Insufficient documentation

## 2015-11-05 DIAGNOSIS — G4733 Obstructive sleep apnea (adult) (pediatric): Secondary | ICD-10-CM | POA: Insufficient documentation

## 2015-11-05 DIAGNOSIS — M5440 Lumbago with sciatica, unspecified side: Secondary | ICD-10-CM | POA: Diagnosis not present

## 2015-11-07 ENCOUNTER — Encounter: Payer: Self-pay | Admitting: Nutrition

## 2015-11-07 ENCOUNTER — Encounter: Payer: Medicare Other | Attending: Family Medicine | Admitting: Nutrition

## 2015-11-07 VITALS — Ht 70.0 in | Wt 379.0 lb

## 2015-11-07 DIAGNOSIS — E118 Type 2 diabetes mellitus with unspecified complications: Secondary | ICD-10-CM | POA: Insufficient documentation

## 2015-11-07 DIAGNOSIS — E1165 Type 2 diabetes mellitus with hyperglycemia: Secondary | ICD-10-CM | POA: Insufficient documentation

## 2015-11-07 DIAGNOSIS — IMO0002 Reserved for concepts with insufficient information to code with codable children: Secondary | ICD-10-CM

## 2015-11-07 NOTE — Patient Instructions (Signed)
Goals 1. Increase fresh fruits and vegetables.- 2 per day. 2. Walk for 10 minutes a day. 3. Lose 1-2 lbs per week. 4. Get A1C less than 7%.

## 2015-11-07 NOTE — Progress Notes (Signed)
  Diabetes Self-Management Education  Visit Type: Follow-up  Appt. Start Time: 1000 Appt. End Time: 1030  11/07/2015  Tracey [name], identified by name and date of birth, is a 60 y.o. female with a diagnosis of Diabetes:  .   ASSESSMENT  Height 5\' 10"  (1.778 m), weight (!) 379 lb (171.9 kg). Body mass index is 54.38 kg/m.      Diabetes Self-Management Education - 11/07/15 1000      Visit Information   Visit Type Follow-up     Health Coping   How would you rate your overall health? Good     Complications   How often do you check your blood sugar? 1-2 times/day     Dietary Intake   Breakfast Egg, toast, appleauce and 1 cup millk 1%   Lunch [place] and tomato bread on ww bread, water, pickle   Dinner Hot Dog, baked beans, water   Beverage(s) water     Exercise   Exercise Type Light (walking / raking leaves)   How many days per week to you exercise? --  walks in the house more   How many minutes per day do you exercise? 5      Individualized Plan for Diabetes Self-Management Training:   Learning Objective:  Patient will have a greater understanding of diabetes self-management. Patient education plan is to attend individual and/or group sessions per assessed needs and concerns.   Plan:   Patient Instructions  Goals 1. Increase fresh fruits and vegetables.- 2 per day. 2. Walk for 10 minutes a day. 3. Lose 1-2 lbs per week. 4. Get A1C less than 7%.    Expected Outcomes:   Weight loss and A1C less than 7%   Education material provided: My Plate  If problems or questions, patient to contact team via:  Phone and Email  Future DSME appointment:

## 2015-11-09 NOTE — Procedures (Signed)
[place] A. Merlene Laughter, MD     [email or web address]             NOCTURNAL POLYSOMNOGRAPHY   LOCATION: [facility]   Patient Name: Tracey [name], Tracey [name] Date: 11/05/2015 Gender: Female D.O.B: [birth year] Age (years): 71 Referring Provider: Not Available Height (inches): 70 Interpreting Physician: Phillips Odor MD, ABSM Weight (lbs): 378 RPSGT: Rosebud Poles BMI: 54 MRN: [id number] Neck Size: 17.00 CLINICAL INFORMATION Sleep Study Type: NPSG Indication for sleep study: N/A Epworth Sleepiness Score: 3 SLEEP STUDY TECHNIQUE As per the AASM Manual for the Scoring of Sleep and Associated Events v2.3 (April 2016) with a hypopnea requiring 4% desaturations. The channels recorded and monitored were frontal, central and occipital EEG, electrooculogram (EOG), submentalis EMG (chin), nasal and oral airflow, thoracic and abdominal wall motion, anterior tibialis EMG, snore microphone, electrocardiogram, and pulse oximetry. MEDICATIONS Patient's medications include: N/A. Medications self-administered by patient during sleep study : No sleep medicine administered.  Current Outpatient Prescriptions:  .  ACCU-CHEK SOFTCLIX LANCETS lancets, Use as instructed twice daily dx E11.65, Disp: 100 each, Rfl: 5 .  amLODipine (NORVASC) 2.5 MG tablet, TAKE ONE (1) TABLET EACH DAY, Disp: 30 tablet, Rfl: 3 .  aspirin EC 81 MG tablet, Take 1 tablet (81 mg total) by mouth daily. (Patient not taking: Reported on 10/28/2015), Disp: 30 tablet, Rfl: 5 .  atorvastatin (LIPITOR) 40 MG tablet, Take 1 tablet (40 mg total) by mouth daily., Disp: 90 tablet, Rfl: 3 .  GLIPIZIDE XL 5 MG 24 hr tablet, TAKE ONE (1) TABLET EACH DAY WITH BREAKFAST, Disp: 30 tablet, Rfl: 2 .  glucose blood (ACCU-CHEK AVIVA) test strip, Use as instructed twice daily dx E11.65, Disp: 100 each, Rfl: 5 .  ibuprofen (ADVIL,MOTRIN) 200 MG tablet, Take 200 mg by mouth every 8 (eight) hours as needed for moderate pain. , Disp: ,  Rfl:  .  INVOKANA 300 MG TABS tablet, TAKE ONE (1) TABLET EACH DAY BEFORE BREAKFAST, Disp: 30 tablet, Rfl: 3 .  JANUMET 50-1000 MG tablet, TAKE ONE TABLET TWICE DAILY, Disp: 60 tablet, Rfl: 2 .  metoprolol tartrate (LOPRESSOR) 25 MG tablet, Take 1 tablet (25 mg total) by mouth 2 (two) times daily., Disp: 60 tablet, Rfl: 6 .  omeprazole (PRILOSEC) 20 MG capsule, TAKE ONE (1) CAPSULE EACH DAY, Disp: 30 capsule, Rfl: 2 .  spironolactone (ALDACTONE) 25 MG tablet, TAKE ONE (1) TABLET BY MOUTH EVERY DAY, Disp: 30 tablet, Rfl: 3  SLEEP ARCHITECTURE The study was initiated at 10:51:03 PM and ended at 4:55:35 AM. Sleep onset time was 41.3 minutes and the sleep efficiency was 74.2%. The total sleep time was 270.5 minutes. Stage REM latency was 39.0 minutes. The patient spent 8.13% of the night in stage N1 sleep, 56.01% in stage N2 sleep, 9.61% in stage N3 and 26.25% in REM. Alpha intrusion was absent. Supine sleep was 44.92%. RESPIRATORY PARAMETERS The overall apnea/hypopnea index (AHI) was 29.9 per hour. There were 72 total apneas, including 67 obstructive, 4 central and 1 mixed apneas. There were 63 hypopneas and 0 RERAs. The AHI during Stage REM sleep was 78.6 per hour. AHI while supine was 37.0 per hour. The mean oxygen saturation was 93.65%. The minimum SpO2 during sleep was 79.00%. Loud snoring was noted during this study. CARDIAC DATA The 2 lead EKG demonstrated sinus rhythm. The mean heart rate was N/A beats per minute. Other EKG findings include: None. LEG MOVEMENT DATA The total PLMS were 0 with a resulting PLMS index of 0.00. Associated arousal  with leg movement index was 0.0.   IMPRESSIONS - Moderate mostly REM obstructive sleep apnea. Suggest formal CPAP titration study.    Delano Metz, MD Diplomate, American Board of Sleep Medicine.

## 2015-11-14 ENCOUNTER — Encounter: Payer: Self-pay | Admitting: Family Medicine

## 2015-11-14 ENCOUNTER — Ambulatory Visit (INDEPENDENT_AMBULATORY_CARE_PROVIDER_SITE_OTHER): Payer: Medicare Other | Admitting: Family Medicine

## 2015-11-14 ENCOUNTER — Encounter (INDEPENDENT_AMBULATORY_CARE_PROVIDER_SITE_OTHER): Payer: Self-pay

## 2015-11-14 VITALS — BP 142/76 | HR 78 | Resp 18 | Ht 70.0 in | Wt 378.0 lb

## 2015-11-14 DIAGNOSIS — E785 Hyperlipidemia, unspecified: Secondary | ICD-10-CM | POA: Diagnosis not present

## 2015-11-14 DIAGNOSIS — E119 Type 2 diabetes mellitus without complications: Secondary | ICD-10-CM | POA: Diagnosis not present

## 2015-11-14 DIAGNOSIS — E669 Obesity, unspecified: Secondary | ICD-10-CM | POA: Diagnosis not present

## 2015-11-14 DIAGNOSIS — Z0000 Encounter for general adult medical examination without abnormal findings: Secondary | ICD-10-CM

## 2015-11-14 NOTE — Patient Instructions (Signed)
F/u in 4 month, call if yopu need me sooner  Please work on good  health habits so that your health will improve. 1. Commitment to daily physical activity for 30 to 60  minutes, if you are able to do this.  2. Commitment to wise food choices. Aim for half of your  food intake to be vegetable and fruit, one quarter starchy foods, and one quarter protein. Try to eat on a regular schedule  3 meals per day, snacking between meals should be limited to vegetables or fruits or small portions of nuts. 64 ounces of water per day is generally recommended, unless you have specific health conditions, like heart failure or kidney failure where you will need to limit fluid intake.  3. Commitment to sufficient and a  good quality of physical and mental rest daily, generally between 6 to 8 hours per day.  WITH PERSISTANCE AND PERSEVERANCE, THE IMPOSSIBLE , BECOMES THE NORM! We will contact you re lab results  Shepp[place] Plumber loss goal of 12 to 15 pounds, you CAN do this!  Thank you  for choosing [facility]. We consider it a privelige to serve you.  Delivering excellent health care in a caring and  compassionate way is our goal.  Partnering with you,  so that together we can achieve this goal is our strategy.

## 2015-11-14 NOTE — Progress Notes (Signed)
Preventive Screening-Counseling & Management   Patient present here today for an initial Medicare annual wellness visit.   Current Problems (verified)   Medications Prior to Visit Allergies (verified)   PAST HISTORY  Family History (updated)   Social History Disabled due to chronic pain; Mother of 3    Risk Factors  Current exercise habits: Limited due to mobility/pain; chair exercises    Dietary issues discussed:  Heart healthy diet limit carbs because of diabetes    Cardiac risk factors: diabetes, hypertension and hyperlipidemia Depression Screen  (Note: if answer to either of the following is "Yes", a more complete depression screening is indicated)   Over the past two weeks, have you felt down, depressed or hopeless? No  Over the past two weeks, have you felt little interest or pleasure in doing things? No  Have you lost interest or pleasure in daily life? No  Do you often feel hopeless? No  Do you cry easily over simple problems? No   Activities of Daily Living  In your present state of health, do you have any difficulty performing the following activities?  Driving?: No Managing money?: No Feeding yourself?:No Getting from bed to chair?: At times due to leg cramping and pain  Climbing a flight of stairs?: Yes, due to mobility, bilateral knee pain Preparing food and eating?:No Bathing or showering?:No Getting dressed?:No Getting to the toilet?:No Using the toilet?:No Moving around from place to place?: Yes see above problems   Fall Risk Assessment In the past year have you fallen or had a near fall?:No Are you currently taking any medications that make you dizzy?:No   Hearing Difficulties: No Do you often ask people to speak up or repeat themselves?:No Do you experience ringing or noises in your ears?:No Do you have difficulty understanding soft or whispered voices?:No  Cognitive Testing  Alert? Yes Normal Appearance?Yes  Oriented to person? Yes Place?  Yes  Time? Yes  Displays appropriate judgment?Yes  Can read the correct time from a watch face? yes Are you having problems remembering things?No  Advanced Directives have been discussed with the patient?Yes and brochure/forms provided    List the Names of Other Physician/Practitioners you currently use:  Care teams up to date    Indicate any recent Medical Services you may have received from other than [facility] providers in the past year (date may be approximate).     Medicare Attestation  I have personally reviewed:  The patient's medical and social history  Their use of alcohol, tobacco or illicit drugs  Their current medications and supplements  The patient's functional ability including ADLs,fall risks, home safety risks, cognitive, and hearing and visual impairment  Diet and physical activities  Evidence for depression or mood disorders  The patient's weight, height, BMI, and visual acuity have been recorded in the chart. I have made referrals, counseling, and provided education to the patient based on review of the above and I have provided the patient with a written personalized care plan for preventive services.    Physical Exam BP (!) 142/76   Pulse 78   Resp 18   Ht 5\' 10"  (1.778 m)   Wt (!) 378 lb (171.5 kg)   SpO2 98%   BMI 54.24 kg/m    Assessment & Plan:  Initial Medicare annual wellness visit Annual exam as documented. Counseling done  re healthy lifestyle involving commitment to 150 minutes exercise per week, heart healthy diet, and attaining healthy weight.The importance of adequate sleep also discussed. Regular  seat belt use and home safety, is also discussed. Changes in health habits are decided on by the patient with goals and time frames  set for achieving them. Immunization and cancer screening needs are specifically addressed at this visit.

## 2015-11-15 LAB — HEMOGLOBIN A1C
Hgb A1c MFr Bld: 7 % — ABNORMAL HIGH (ref <5.7)
Mean Plasma Glucose: 154 mg/dL

## 2015-11-15 LAB — COMPLETE METABOLIC PANEL WITH GFR
ALBUMIN: 3.8 g/dL (ref 3.6–5.1)
ALK PHOS: 69 U/L (ref 33–130)
ALT: 26 U/L (ref 6–29)
AST: 20 U/L (ref 10–35)
BILIRUBIN TOTAL: 0.5 mg/dL (ref 0.2–1.2)
BUN: 10 mg/dL (ref 7–25)
CO2: 26 mmol/L (ref 20–31)
CREATININE: 0.78 mg/dL (ref 0.50–1.05)
Calcium: 9 mg/dL (ref 8.6–10.4)
Chloride: 104 mmol/L (ref 98–110)
GFR, EST NON AFRICAN AMERICAN: 84 mL/min (ref >=60)
GFR, Est African American: >89 mL/min (ref >=60)
GLUCOSE: 141 mg/dL — AB (ref 65–99)
Potassium: 4.5 mmol/L (ref 3.5–5.3)
SODIUM: 139 mmol/L (ref 135–146)
Total Protein: 7.3 g/dL (ref 6.1–8.1)

## 2015-11-15 LAB — LIPID PANEL
Cholesterol: 140 mg/dL (ref 125–200)
HDL: 42 mg/dL — ABNORMAL LOW (ref >=46)
LDL CALC: 83 mg/dL (ref <130)
TRIGLYCERIDES: 74 mg/dL (ref <150)
Total CHOL/HDL Ratio: 3.3 Ratio (ref <=5.0)
VLDL: 15 mg/dL (ref <30)

## 2015-11-16 NOTE — Assessment & Plan Note (Signed)

## 2015-12-02 ENCOUNTER — Other Ambulatory Visit: Payer: Self-pay | Admitting: Family Medicine

## 2015-12-06 ENCOUNTER — Inpatient Hospital Stay: Admission: RE | Admit: 2015-12-06 | Payer: Medicare Other | Source: Ambulatory Visit

## 2015-12-17 ENCOUNTER — Inpatient Hospital Stay: Admission: RE | Admit: 2015-12-17 | Payer: Medicare Other | Source: Ambulatory Visit

## 2016-02-06 ENCOUNTER — Ambulatory Visit: Payer: Medicare Other | Admitting: Nutrition

## 2016-02-10 ENCOUNTER — Encounter: Payer: Self-pay | Admitting: Nutrition

## 2016-02-10 ENCOUNTER — Encounter: Payer: Medicare Other | Attending: Family Medicine | Admitting: Nutrition

## 2016-02-10 VITALS — Ht 70.0 in | Wt 379.2 lb

## 2016-02-10 DIAGNOSIS — E1165 Type 2 diabetes mellitus with hyperglycemia: Secondary | ICD-10-CM

## 2016-02-10 DIAGNOSIS — E118 Type 2 diabetes mellitus with unspecified complications: Secondary | ICD-10-CM | POA: Insufficient documentation

## 2016-02-10 DIAGNOSIS — Z6841 Body Mass Index (BMI) 40.0 and over, adult: Secondary | ICD-10-CM | POA: Insufficient documentation

## 2016-02-10 DIAGNOSIS — Z713 Dietary counseling and surveillance: Secondary | ICD-10-CM | POA: Diagnosis not present

## 2016-02-10 DIAGNOSIS — IMO0002 Reserved for concepts with insufficient information to code with codable children: Secondary | ICD-10-CM

## 2016-02-10 NOTE — Patient Instructions (Addendum)
Goals 1. Get FBS to 130 mg or less before breakfast 2. Increase fhresh fruits and vegeatables. Increase physical activity to 30 minutes every day.  Get A1C less than 7%  Lose 1-2 lbs per week.

## 2016-02-10 NOTE — Progress Notes (Signed)
  Diabetes Self-Management Education  Visit Type:    Appt. Start Time: 1100 Appt. End Time: 1130 02/10/2016  Tracey [name], identified by name and date of birth, is a 60 y.o. female with a diagnosis of Diabetes:  . She reports she is drinking more water, walking some and feels beter. BS log brought in. FBS 130-180's. Access to healthy foods is limited at times. Sees. Dr Moshe Cipro in Dec..A1C in August 7%.Wants to get it down to 6% or less.   Lab Results  Component Value Date   HGBA1C 7.0 (H) 11/14/2015      ASSESSMENT  Wt Readings from Last 3 Encounters:  02/10/16 (!) 379 lb 3.2 oz (172 kg)  11/14/15 (!) 378 lb (171.5 kg)  11/07/15 (!) 379 lb (171.9 kg)   Ht Readings from Last 3 Encounters:  02/10/16 5\' 10"  (1.778 m)  11/14/15 5\' 10"  (1.778 m)  11/07/15 5\' 10"  (1.778 m)   Body mass index is 54.41 kg/m.   Meals  B) 2 boiled eggs, cheese 1 slice toast,  Coffee Milk 2% 8 oz L) Baked chicken and turnip greens and baked beans 1/2c, Lemonade and water Dinner: Same as lunch   Individualized Plan for Diabetes Self-Management Training:   Learning Objective:  Patient will have a greater understanding of diabetes self-management. Patient education plan is to attend individual and/or group sessions per assessed needs and concerns.   Plan:   Goals 1. Get FBS to 130 mg or less before breakfast 2. Increase fhresh fruits and vegeatables. Increase physical activity to 30 minutes every day.  Get A1C less than 7%  Lose 1-2 lbs per week.  Expected Outcomes:   Weight loss and A1C less than 7%   Education material provided: My Plate  If problems or questions, patient to contact team via:  Phone and Email  Future DSME appointment:  3 months

## 2016-02-12 ENCOUNTER — Other Ambulatory Visit: Payer: Self-pay | Admitting: Family Medicine

## 2016-03-16 ENCOUNTER — Encounter: Payer: Self-pay | Admitting: Family Medicine

## 2016-03-16 ENCOUNTER — Ambulatory Visit (INDEPENDENT_AMBULATORY_CARE_PROVIDER_SITE_OTHER): Payer: Medicare Other | Admitting: Family Medicine

## 2016-03-16 VITALS — BP 120/82 | HR 100 | Temp 98.7°F | Resp 18 | Ht 70.0 in | Wt 387.0 lb

## 2016-03-16 DIAGNOSIS — K219 Gastro-esophageal reflux disease without esophagitis: Secondary | ICD-10-CM

## 2016-03-16 DIAGNOSIS — Z23 Encounter for immunization: Secondary | ICD-10-CM

## 2016-03-16 DIAGNOSIS — I10 Essential (primary) hypertension: Secondary | ICD-10-CM

## 2016-03-16 DIAGNOSIS — E669 Obesity, unspecified: Secondary | ICD-10-CM

## 2016-03-16 DIAGNOSIS — E785 Hyperlipidemia, unspecified: Secondary | ICD-10-CM

## 2016-03-16 DIAGNOSIS — E1169 Type 2 diabetes mellitus with other specified complication: Secondary | ICD-10-CM

## 2016-03-16 DIAGNOSIS — E559 Vitamin D deficiency, unspecified: Secondary | ICD-10-CM | POA: Diagnosis not present

## 2016-03-16 DIAGNOSIS — G4730 Sleep apnea, unspecified: Secondary | ICD-10-CM

## 2016-03-16 NOTE — Assessment & Plan Note (Signed)
After obtaining informed consent, the vaccine is  administered by LPN.  

## 2016-03-16 NOTE — Assessment & Plan Note (Signed)
Controlled, no change in medication  

## 2016-03-16 NOTE — Assessment & Plan Note (Signed)
Will rept sleep study for definitive dx and treatment

## 2016-03-16 NOTE — Patient Instructions (Addendum)
F/u first week in April, pelvic and breast exam, call if you need me sooner  Flu vaccine today  Labs today, cmp and EGFr, hBA1C and vit D  Fasting labs March 30 or after, cBC, lipid, cmp and eGFr, hBA1C , TSH and microalb  Pls schedule and do mammogram this moth, when due   Please work on good  health habits so that your health will improve. 1. Commitment to daily physical activity for 30 to 60  minutes, if you are able to do this.  2. Commitment to wise food choices. Aim for half of your  food intake to be vegetable and fruit, one quarter starchy foods, and one quarter protein. Try to eat on a regular schedule  3 meals per day, snacking between meals should be limited to vegetables or fruits or small portions of nuts. 64 ounces of water per day is generally recommended, unless you have specific health conditions, like heart failure or kidney failure where you will need to limit fluid intake.  3. Commitment to sufficient and a  good quality of physical and mental rest daily, generally between 6 to 8 hours per day.  WITH PERSISTANCE AND PERSEVERANCE, THE IMPOSSIBLE , BECOMES THE NORM! Thank you  for choosing [facility]. We consider it a privelige to serve you.  Delivering excellent health care in a caring and  compassionate way is our goal.  Partnering with you,  so that together we can achieve this goal is our strategy.

## 2016-03-16 NOTE — Assessment & Plan Note (Signed)
Hyperlipidemia:Low fat diet discussed and encouraged.   Lipid Panel  Lab Results  Component Value Date   CHOL 140 11/14/2015   HDL 42 (L) 11/14/2015   LDLCALC 83 11/14/2015   TRIG 74 11/14/2015   CHOLHDL 3.3 11/14/2015   Controlled, no change in medication Updated lab needed at/ before next visit.

## 2016-03-16 NOTE — Assessment & Plan Note (Signed)
Controlled, no change in medication DASH diet and commitment to daily physical activity for a minimum of 30 minutes discussed and encouraged, as a part of hypertension management. The importance of attaining a healthy weight is also discussed.  BP/Weight 03/16/2016 02/10/2016 11/14/2015 11/07/2015 11/05/2015 10/31/2015 0000000  Systolic BP 123456 - [id number] - - 123456 -  Diastolic BP 82 - 76 - - 78 -  Wt. (Lbs) 387.04 379.2 378 379 378 370 375.8  BMI 55.53 54.41 54.24 54.38 54.24 53.09 53.92

## 2016-03-16 NOTE — Assessment & Plan Note (Signed)
Deteriorated. Patient re-educated about  the importance of commitment to a  minimum of 150 minutes of exercise per week.  The importance of healthy food choices with portion control discussed. Encouraged to start a food diary, count calories and to consider  joining a support group. Sample diet sheets offered. Goals set by the patient for the next several months.   Weight /BMI 03/16/2016 02/10/2016 11/14/2015  WEIGHT 387 lb 0.6 oz 379 lb 3.2 oz 378 lb  HEIGHT 5\' 10"  5\' 10"  5\' 10"   BMI 55.53 kg/m2 54.41 kg/m2 54.24 kg/m2

## 2016-03-16 NOTE — Progress Notes (Signed)
Tracey [name]     MRN: [id number]      DOB: 1956-02-09   HPI Tracey [name] is here for follow up and re-evaluation of chronic medical conditions, medication management and review of any available recent lab and radiology data.  Preventive health is updated, specifically  Cancer screening and Immunization.   Questions or concerns regarding consultations or procedures which the PT has had in the interim are  addressed. The PT denies any adverse reactions to current medications since the last visit.  There are no new concerns.  There are no specific complaints  Denies polyuria, polydipsia, blurred vision , or hypoglycemic episodes.   ROS Denies recent fever or chills. Denies sinus pressure, nasal congestion, ear pain or sore throat. Denies chest congestion, productive cough or wheezing. Denies chest pains, palpitations and leg swelling Denies abdominal pain, nausea, vomiting,diarrhea or constipation.   Denies dysuria, frequency, hesitancy or incontinence. Denies joint pain, swelling and limitation in mobility. Denies headaches, seizures, numbness, or tingling. Denies depression, anxiety or insomnia. Denies skin break down or rash.   PE  BP 120/82 (BP Location: Left Wrist, Patient Position: Sitting, Cuff Size: Normal)   Pulse 100   Temp 98.7 F (37.1 C) (Oral)   Resp 18   Ht 5\' 10"  (1.778 m)   Wt (!) 387 lb 0.6 oz (175.6 kg)   SpO2 97%   BMI 55.53 kg/m   Patient alert and oriented and in no cardiopulmonary distress.  HEENT: No facial asymmetry, EOMI,   oropharynx pink and moist.  Neck supple no JVD, no mass.  Chest: Clear to auscultation bilaterally.  CVS: S1, S2 no murmurs, no S3.Regular rate.  ABD: Soft non tender.   Ext: No edema  MS: Adequate though reduced  ROM spine, shoulders, hips and knees.  Skin: Intact, no ulcerations or rash noted.  Psych: Good eye contact, normal affect. Memory intact not anxious or depressed appearing.  CNS: CN 2-12 intact,  power,  normal throughout.no focal deficits noted.   Assessment & Plan  Hypertension Controlled, no change in medication DASH diet and commitment to daily physical activity for a minimum of 30 minutes discussed and encouraged, as a part of hypertension management. The importance of attaining a healthy weight is also discussed.  BP/Weight 03/16/2016 02/10/2016 11/14/2015 11/07/2015 11/05/2015 10/31/2015 0000000  Systolic BP 123456 - A999333 - - 123456 -  Diastolic BP 82 - 76 - - 78 -  Wt. (Lbs) 387.04 379.2 378 379 378 370 375.8  BMI 55.53 54.41 54.24 54.38 54.24 53.09 53.92       Morbid obesity Deteriorated. Patient re-educated about  the importance of commitment to a  minimum of 150 minutes of exercise per week.  The importance of healthy food choices with portion control discussed. Encouraged to start a food diary, count calories and to consider  joining a support group. Sample diet sheets offered. Goals set by the patient for the next several months.   Weight /BMI 03/16/2016 02/10/2016 11/14/2015  WEIGHT 387 lb 0.6 oz 379 lb 3.2 oz 378 lb  HEIGHT 5\' 10"  5\' 10"  5\' 10"   BMI 55.53 kg/m2 54.41 kg/m2 54.24 kg/m2      Hyperlipidemia LDL goal <100 Hyperlipidemia:Low fat diet discussed and encouraged.   Lipid Panel  Lab Results  Component Value Date   CHOL 140 11/14/2015   HDL 42 (L) 11/14/2015   LDLCALC 83 11/14/2015   TRIG 74 11/14/2015   CHOLHDL 3.3 11/14/2015   Controlled, no change in medication Updated  lab needed at/ before next visit.    GERD Controlled, no change in medication   Need for prophylactic vaccination and inoculation against influenza After obtaining informed consent, the vaccine is  administered by LPN.   Sleep disorder breathing Will rept sleep study for definitive dx and treatment

## 2016-03-17 LAB — COMPLETE METABOLIC PANEL WITH GFR
ALBUMIN: 4.1 g/dL (ref 3.6–5.1)
ALK PHOS: 73 U/L (ref 33–130)
ALT: 29 U/L (ref 6–29)
AST: 21 U/L (ref 10–35)
BUN: 13 mg/dL (ref 7–25)
CALCIUM: 9.3 mg/dL (ref 8.6–10.4)
CO2: 22 mmol/L (ref 20–31)
Chloride: 102 mmol/L (ref 98–110)
Creat: 0.7 mg/dL (ref 0.50–1.05)
GFR, Est African American: >89 mL/min (ref >=60)
Glucose, Bld: 142 mg/dL — ABNORMAL HIGH (ref 65–99)
POTASSIUM: 4.4 mmol/L (ref 3.5–5.3)
Sodium: 137 mmol/L (ref 135–146)
Total Bilirubin: 0.5 mg/dL (ref 0.2–1.2)
Total Protein: 7.5 g/dL (ref 6.1–8.1)

## 2016-03-17 LAB — VITAMIN D 25 HYDROXY (VIT D DEFICIENCY, FRACTURES): VIT D 25 HYDROXY: 8 ng/mL — AB (ref 30–100)

## 2016-03-17 LAB — HEMOGLOBIN A1C
Hgb A1c MFr Bld: 7 % — ABNORMAL HIGH (ref <5.7)
Mean Plasma Glucose: 154 mg/dL

## 2016-03-23 ENCOUNTER — Other Ambulatory Visit: Payer: Self-pay

## 2016-03-23 MED ORDER — VITAMIN D (ERGOCALCIFEROL) 1.25 MG (50000 UNIT) PO CAPS: 50000.0000 [IU] | ORAL_CAPSULE | ORAL | 5 refills | Status: DC

## 2016-04-15 ENCOUNTER — Other Ambulatory Visit: Payer: Self-pay | Admitting: Family Medicine

## 2016-05-06 ENCOUNTER — Ambulatory Visit: Payer: Medicare Other | Admitting: Nutrition

## 2016-05-12 ENCOUNTER — Encounter: Payer: Medicare Other | Attending: Family Medicine | Admitting: Nutrition

## 2016-05-12 DIAGNOSIS — E669 Obesity, unspecified: Secondary | ICD-10-CM | POA: Diagnosis not present

## 2016-05-12 DIAGNOSIS — E118 Type 2 diabetes mellitus with unspecified complications: Secondary | ICD-10-CM | POA: Diagnosis not present

## 2016-05-12 DIAGNOSIS — Z6841 Body Mass Index (BMI) 40.0 and over, adult: Secondary | ICD-10-CM | POA: Diagnosis not present

## 2016-05-12 NOTE — Patient Instructions (Signed)
Goals Follow My Plate Eat three meals on time daily Drink water and cut out sodas Increase fresh fruits and vegetable Call DR. [place] office about sleep study Call DR. [place] office to refer to Dr. Dorris Fetch, Endocrinologist to evaluate for thyroid issues. Don't skip meals Do snacks between meals unless its vegetables. Lose 10 lbs in the next month.

## 2016-05-12 NOTE — Progress Notes (Signed)
Diabetes Self-Management Education  Visit Type:    Appt. Start Time: 1100 Appt. End Time: 1130 05/12/2016  [name], identified by name and date of birth, is a 61 y.o. female with a diagnosis of Diabetes Type 2. Had DM for > 20 years. .  A1C is still 7% but she gained about 10 lbs in the last 3-4 months. BMI 55.67.  Denies over eating but eats inconsistently.  On disability due to back and  knees problems.  Eats 2 meals per day. Her eating and sleeping schedule is  Inconsistent. She tends to stay up til 4-6 am and then sleep til noon or later. Walks some in grocery store when she can but [place] rides the cart.       BS meter brought14 dy avg 200 mg/dl, 7 day 222 mg/dl and 30 day 214 mg/dl Meds: Janument 50/1000 BID, Invokana 300 mg and Glipizide 5 mg daily.    Had a sleep study in 2017 after seeing DR. [place] in [place].  No one got back in touch with her after the test she reports. Admits to snoring and staying chronically tired and no energy.  Has not been tested for thyroid issues according to her.  She would benefit from seeing an Endocrinologist for work up of poossible thyroid issues and rule out Cushing's or other endocrine issues besides Type 2 DM.  Access to healthy foods is limited at times. Provided her with a food [facility] [facility]. Sees. Dr Moshe Cipro in April 2018.  Diet is inconsistent to meet her needs. Low in fresh fruits, vegetables and whole grains. Needs to increase physical activity. Getting on Cpap machine will help tremendously.    Lab Results  Component Value Date   HGBA1C 7.0 (H) 03/16/2016      ASSESSMENT  Wt Readings from Last 3 Encounters:  05/12/16 (!) 388 lb (176 kg)  03/16/16 (!) 387 lb 0.6 oz (175.6 kg)  02/10/16 (!) 379 lb 3.2 oz (172 kg)   Ht Readings from Last 3 Encounters:  05/12/16 5\' 10"  (1.778 m)  03/16/16 5\' 10"  (1.778 m)  02/10/16 5\' 10"  (1.778 m)   Body mass index is 55.67 kg/m.   Meals 10-11 am  Skipped  this am: [place] sandwich with tomatoes and cheese, Soda 12 oz Dinner: Toss salad, cukes, [place], cheese and onions and tomatoes,  Water and lite Ranch dressing. Snack: apple   Individualized Plan for Diabetes Self-Management Training:   Learning Objective:  Patient will have a greater understanding of diabetes self-management. Patient education plan is to attend individual and/or group sessions per assessed needs and concerns.   Plan:  Goals Follow My Plate Eat three meals on time daily Drink water and cut out sodas Increase fresh fruits and vegetable Call DR. [place] office about sleep study Call DR. [place] office to refer to Dr. Dorris Fetch, Endocrinologist to evaluate for thyroid issues. Don't skip meals Do snacks between meals unless its vegetables. Lose 10 lbs in the next month.   Expected Outcomes:   Weight loss and A1C less than 7%   Education material provided: My Plate  If problems or questions, patient to contact team via:  Phone and Email  Future DSME appointment:  1 months    Recommend to refer to Dr. Dorris Fetch, Endocrine to evaluate for thryoid and other endocrine issues.       Recommend referral back to Dr. Merlene Laughter to follow up on sleep study and get started on Cpap machine for OSA.

## 2016-05-20 ENCOUNTER — Other Ambulatory Visit: Payer: Self-pay | Admitting: Family Medicine

## 2016-05-20 DIAGNOSIS — Z1231 Encounter for screening mammogram for malignant neoplasm of breast: Secondary | ICD-10-CM

## 2016-05-27 ENCOUNTER — Other Ambulatory Visit: Payer: Self-pay | Admitting: Internal Medicine

## 2016-05-27 ENCOUNTER — Ambulatory Visit (HOSPITAL_COMMUNITY): Payer: Medicare Other

## 2016-06-04 ENCOUNTER — Ambulatory Visit (HOSPITAL_COMMUNITY)
Admission: RE | Admit: 2016-06-04 | Discharge: 2016-06-04 | Disposition: A | Discharge status: Home / Self Care | Payer: Medicare Other | Source: Ambulatory Visit | Attending: Family Medicine | Admitting: Family Medicine

## 2016-06-04 DIAGNOSIS — Z1231 Encounter for screening mammogram for malignant neoplasm of breast: Secondary | ICD-10-CM | POA: Diagnosis not present

## 2016-06-10 ENCOUNTER — Ambulatory Visit: Payer: Medicare Other | Admitting: Nutrition

## 2016-06-29 ENCOUNTER — Ambulatory Visit: Payer: Medicare Other | Admitting: Nutrition

## 2016-07-01 ENCOUNTER — Encounter: Payer: Medicare Other | Attending: Family Medicine | Admitting: Nutrition

## 2016-07-01 ENCOUNTER — Other Ambulatory Visit: Payer: Self-pay | Admitting: Family Medicine

## 2016-07-01 VITALS — Ht 70.0 in | Wt 386.0 lb

## 2016-07-01 DIAGNOSIS — E118 Type 2 diabetes mellitus with unspecified complications: Secondary | ICD-10-CM | POA: Diagnosis not present

## 2016-07-01 DIAGNOSIS — E1165 Type 2 diabetes mellitus with hyperglycemia: Secondary | ICD-10-CM

## 2016-07-01 DIAGNOSIS — IMO0002 Reserved for concepts with insufficient information to code with codable children: Secondary | ICD-10-CM

## 2016-07-01 DIAGNOSIS — Z713 Dietary counseling and surveillance: Secondary | ICD-10-CM | POA: Diagnosis not present

## 2016-07-01 DIAGNOSIS — G4730 Sleep apnea, unspecified: Secondary | ICD-10-CM

## 2016-07-01 DIAGNOSIS — Z6841 Body Mass Index (BMI) 40.0 and over, adult: Secondary | ICD-10-CM | POA: Diagnosis not present

## 2016-07-01 NOTE — Patient Instructions (Addendum)
Goals 1. Try oatmeal or cherrios instead of fast food breakfast sandwich 2. Eat half of plate at lunch and dinner of fresh fruit and vegetables 3. Do not skip meals. 4. Walk in driveway 3 times a day 5. Drink water only. 6.  Go bed by midnight and sleep til 9 am.

## 2016-07-01 NOTE — Progress Notes (Signed)
  Diabetes Self-Management Education  Visit Type:    Appt. Start Time: 1100 Appt. End Time: 1130 07/01/2016  [name], identified by name and date of birth, is a 61 y.o. female with a diagnosis of Diabetes Type 2. Had DM for > 20 years. .Morbid Obesity BMI >55.  A1C is still 7%. 30 day avg BS log 153 mg/dl. Testing once a day in am. Not interested in bariatric weight loss. Lost 2 lbs since last visit. Didn't follow up with Dr. Freddie [name] office about the CPAP yet.  She continues to have poor sleep habits by not going to bed til 6 am and then sleeping til 12-2 pm in afternoon. Still struggling to lose weight and improve her diabetes. Called Dr. Freddie [name] office. They need new referral from PCP and schedule an appt with her to go over results and more testing per their staff. Pt. Willing to follow up with them.   Still struggles with access to health food options and ability to prepare healthy items due to her deconditioned physical condition. Meals remain irregular and she is only eating 1-2 times per day. Not eating much fresh fruits, vegetables or whole grain foods. Diet is high in processed foods.   Lab Results  Component Value Date   HGBA1C 7.0 (H) 03/16/2016      ASSESSMENT  Wt Readings from Last 3 Encounters:  07/01/16 (!) 386 lb (175.1 kg)  05/12/16 (!) 388 lb (176 kg)  03/16/16 (!) 387 lb 0.6 oz (175.6 kg)   Ht Readings from Last 3 Encounters:  07/01/16 5\' 10"  (1.778 m)  05/12/16 5\' 10"  (1.778 m)  03/16/16 5\' 10"  (1.778 m)   Body mass index is 55.39 kg/m.   B) skipping breakfast or bacon and egg/cheese crossaint and coffee L) skipped D). Cant remember.   Individualized Plan for Diabetes Self-Management Training:   Learning Objective:  Patient will have a greater understanding of diabetes self-management. Patient education plan is to attend individual and/or group sessions per assessed needs and concerns.   Plan:  . Goals 1. Try oatmeal or cherrios  instead of fast food breakfast sandwich 2. Eat half of plate at lunch and dinner of fresh fruit and vegetables 3. Do not skip meals. 4. Walk in driveway 3 times a day 5. Drink water only. 6.  Go bed by midnight and sleep til 9 am.  Expected Outcomes:   Weight loss and A1C less than 7%   Education material provided: My Plate  If problems or questions, patient to contact team via:  Phone and Email  Future DSME appointment:  1 months    Recommend to refer to Dr. Dorris [name], Endocrine to evaluate for thryoid and other endocrine issues.       Recommend referral back to Dr. Merlene [name] to follow up on sleep study and get started on Cpap machine for OSA.

## 2016-07-02 ENCOUNTER — Other Ambulatory Visit: Payer: Self-pay | Admitting: Family Medicine

## 2016-07-02 ENCOUNTER — Other Ambulatory Visit: Payer: Self-pay | Admitting: Internal Medicine

## 2016-07-13 DIAGNOSIS — E669 Obesity, unspecified: Secondary | ICD-10-CM | POA: Diagnosis not present

## 2016-07-13 DIAGNOSIS — E785 Hyperlipidemia, unspecified: Secondary | ICD-10-CM | POA: Diagnosis not present

## 2016-07-13 DIAGNOSIS — E1169 Type 2 diabetes mellitus with other specified complication: Secondary | ICD-10-CM | POA: Diagnosis not present

## 2016-07-14 DIAGNOSIS — G4733 Obstructive sleep apnea (adult) (pediatric): Secondary | ICD-10-CM | POA: Diagnosis not present

## 2016-07-14 DIAGNOSIS — I10 Essential (primary) hypertension: Secondary | ICD-10-CM | POA: Diagnosis not present

## 2016-07-14 DIAGNOSIS — M5440 Lumbago with sciatica, unspecified side: Secondary | ICD-10-CM | POA: Diagnosis not present

## 2016-07-14 DIAGNOSIS — E119 Type 2 diabetes mellitus without complications: Secondary | ICD-10-CM | POA: Diagnosis not present

## 2016-07-14 LAB — CBC
HEMATOCRIT: 41.3 % (ref 35.0–45.0)
HEMOGLOBIN: 13.3 g/dL (ref 11.7–15.5)
MCH: 23.3 pg — AB (ref 27.0–33.0)
MCHC: 32.2 g/dL (ref 32.0–36.0)
MCV: 72.3 fL — ABNORMAL LOW (ref 80.0–100.0)
MPV: 10.3 fL (ref 7.5–12.5)
Platelets: 287 10*3/uL (ref 140–400)
RBC: 5.71 MIL/uL — ABNORMAL HIGH (ref 3.80–5.10)
RDW: 18.1 % — AB (ref 11.0–15.0)
WBC: 10.7 10*3/uL (ref 3.8–10.8)

## 2016-07-14 LAB — COMPLETE METABOLIC PANEL WITH GFR
ALT: 27 U/L (ref 6–29)
AST: 21 U/L (ref 10–35)
Albumin: 3.9 g/dL (ref 3.6–5.1)
Alkaline Phosphatase: 61 U/L (ref 33–130)
BUN: 11 mg/dL (ref 7–25)
CHLORIDE: 103 mmol/L (ref 98–110)
CO2: 21 mmol/L (ref 20–31)
Calcium: 8.8 mg/dL (ref 8.6–10.4)
Creat: 0.75 mg/dL (ref 0.50–1.05)
GFR, Est African American: >89 mL/min (ref >=60)
GFR, Est Non African American: 88 mL/min (ref >=60)
Glucose, Bld: 151 mg/dL — ABNORMAL HIGH (ref 65–99)
POTASSIUM: 4.1 mmol/L (ref 3.5–5.3)
SODIUM: 138 mmol/L (ref 135–146)
Total Bilirubin: 0.5 mg/dL (ref 0.2–1.2)
Total Protein: 7.5 g/dL (ref 6.1–8.1)

## 2016-07-14 LAB — HEMOGLOBIN A1C
HEMOGLOBIN A1C: 7 % — AB (ref <5.7)
Mean Plasma Glucose: 154 mg/dL

## 2016-07-14 LAB — LIPID PANEL
CHOL/HDL RATIO: 4.8 ratio (ref <5.0)
Cholesterol: 190 mg/dL (ref <200)
HDL: 40 mg/dL — ABNORMAL LOW (ref >50)
LDL Cholesterol: 127 mg/dL — ABNORMAL HIGH (ref <100)
Triglycerides: 115 mg/dL (ref <150)
VLDL: 23 mg/dL (ref <30)

## 2016-07-14 LAB — TSH: TSH: 0.95 mIU/L

## 2016-07-15 ENCOUNTER — Ambulatory Visit (INDEPENDENT_AMBULATORY_CARE_PROVIDER_SITE_OTHER): Payer: Medicare Other | Admitting: Family Medicine

## 2016-07-15 ENCOUNTER — Encounter: Payer: Self-pay | Admitting: Family Medicine

## 2016-07-15 ENCOUNTER — Other Ambulatory Visit (HOSPITAL_COMMUNITY)
Admission: AD | Admit: 2016-07-15 | Discharge: 2016-07-15 | Disposition: A | Discharge status: Home / Self Care | Payer: Medicare Other | Source: Skilled Nursing Facility | Attending: Family Medicine | Admitting: Family Medicine

## 2016-07-15 ENCOUNTER — Other Ambulatory Visit (HOSPITAL_COMMUNITY)
Admission: RE | Admit: 2016-07-15 | Discharge: 2016-07-15 | Disposition: A | Discharge status: Home / Self Care | Payer: Medicare Other | Source: Ambulatory Visit | Attending: Family Medicine | Admitting: Family Medicine

## 2016-07-15 VITALS — BP 122/74 | HR 104 | Temp 97.8°F | Resp 18 | Ht 70.0 in | Wt 391.0 lb

## 2016-07-15 DIAGNOSIS — E1169 Type 2 diabetes mellitus with other specified complication: Secondary | ICD-10-CM | POA: Insufficient documentation

## 2016-07-15 DIAGNOSIS — Z124 Encounter for screening for malignant neoplasm of cervix: Secondary | ICD-10-CM

## 2016-07-15 DIAGNOSIS — Z1211 Encounter for screening for malignant neoplasm of colon: Secondary | ICD-10-CM | POA: Diagnosis not present

## 2016-07-15 DIAGNOSIS — B351 Tinea unguium: Secondary | ICD-10-CM | POA: Diagnosis not present

## 2016-07-15 DIAGNOSIS — E559 Vitamin D deficiency, unspecified: Secondary | ICD-10-CM | POA: Diagnosis not present

## 2016-07-15 DIAGNOSIS — Z0000 Encounter for general adult medical examination without abnormal findings: Secondary | ICD-10-CM | POA: Diagnosis not present

## 2016-07-15 DIAGNOSIS — E669 Obesity, unspecified: Secondary | ICD-10-CM | POA: Diagnosis not present

## 2016-07-15 DIAGNOSIS — Z01419 Encounter for gynecological examination (general) (routine) without abnormal findings: Secondary | ICD-10-CM | POA: Diagnosis not present

## 2016-07-15 DIAGNOSIS — E785 Hyperlipidemia, unspecified: Secondary | ICD-10-CM | POA: Diagnosis not present

## 2016-07-15 LAB — POC HEMOCCULT BLD/STL (OFFICE/1-CARD/DIAGNOSTIC): Fecal Occult Blood, POC: NEGATIVE

## 2016-07-15 MED ORDER — ATORVASTATIN CALCIUM 80 MG PO TABS: 80.0000 mg | ORAL_TABLET | Freq: Every day | ORAL | 5 refills | Status: DC

## 2016-07-15 MED ORDER — TERBINAFINE HCL 250 MG PO TABS: 250.0000 mg | ORAL_TABLET | Freq: Every day | ORAL | 1 refills | Status: DC

## 2016-07-15 MED ORDER — ERGOCALCIFEROL 1.25 MG (50000 UT) PO CAPS: 50000.0000 [IU] | ORAL_CAPSULE | ORAL | 1 refills | Status: DC

## 2016-07-15 MED ORDER — FLUCONAZOLE 100 MG PO TABS: 100.0000 mg | ORAL_TABLET | Freq: Every day | ORAL | 0 refills | Status: DC

## 2016-07-15 NOTE — Patient Instructions (Addendum)
Wellness August 4 or after with Ardeen Garland today  MD f/u in 6 months  You are referred to podiatrist.  New medication terbinafine one daily for 12 weeks for fungal infection of toenails  Fluconazole one tablet sent in for 3 days for yeast infection  Please work on weight loss by eating less calories daily, and eating on a regular schedule  Fasting lipid, cmp and eGFR and HBA1C and vit d in 5 month  Thank you  for choosing [facility]. We consider it a privelige to serve you.  Delivering excellent health care in a caring and  compassionate way is our goal.  Partnering with you,  so that together we can achieve this goal is our strategy.

## 2016-07-15 NOTE — Assessment & Plan Note (Signed)
Uncontrolled, dose increase in liptor and dietary modification Hyperlipidemia:Low fat diet discussed and encouraged.   Lipid Panel  Lab Results  Component Value Date   CHOL 190 07/13/2016   HDL 40 (L) 07/13/2016   LDLCALC 127 (H) 07/13/2016   TRIG 115 07/13/2016   CHOLHDL 4.8 07/13/2016

## 2016-07-15 NOTE — Assessment & Plan Note (Signed)

## 2016-07-15 NOTE — Assessment & Plan Note (Signed)
Tracey [name] is reminded of the importance of commitment to daily physical activity for 30 minutes or more, as able and the need to limit carbohydrate intake to 30 to 60 grams per meal to help with blood sugar control.   The need to take medication as prescribed, test blood sugar as directed, and to call between visits if there is a concern that blood sugar is uncontrolled is also discussed.   Tracey [name] is reminded of the importance of daily foot exam, annual eye examination, and good blood sugar, blood pressure and cholesterol control.  Diabetic Labs Latest Ref Rng & Units 07/13/2016 03/16/2016 11/14/2015 07/10/2015 04/10/2015  HbA1c <5.7 % 7.0(H) 7.0(H) 7.0(H) 7.0(H) -  Microalbumin Not estab mg/dL - - - - 6.9  Micro/Creat Ratio <30 mcg/mg creat - - - - 28  Chol <200 mg/dL 190 - 140 - -  HDL >50 mg/dL 40(L) - 42(L) - -  Calc LDL <100 mg/dL 127(H) - 83 - -  Triglycerides <150 mg/dL 115 - 74 - -  Creatinine 0.50 - 1.05 mg/dL 0.75 0.70 0.78 - -   BP/Weight 07/15/2016 07/01/2016 05/12/2016 03/16/2016 02/10/2016 11/14/2015 07/24/8206  Systolic BP 138 - - 871 - 959 -  Diastolic BP 74 - - 82 - 76 -  Wt. (Lbs) 391 386 388 387.04 379.2 378 379  BMI 56.1 55.39 55.67 55.53 54.41 54.24 54.38   Foot/eye exam completion dates 07/15/2016 05/13/2015  Foot Form Completion Done Done

## 2016-07-15 NOTE — Progress Notes (Signed)
Tracey [name]     MRN: [id number]      DOB: March 20, 1956   HPI Tracey [name] is here for follow up and re-evaluation of chronic medical conditions, medication management and review of any available recent lab and radiology data.  Pelvic and breast exam also done at visit and diabetic foot exam Preventive health is updated, specifically  Cancer screening and Immunization.   Questions or concerns regarding consultations or procedures which the PT has had in the interim are  addressed. The PT denies any adverse reactions to current medications since the last visit.  c/o weight gain. c/o excessively long thickened  nails , needs help with foot care  ROS Denies recent fever or chills. Denies sinus pressure, nasal congestion, ear pain or sore throat. Denies chest congestion, productive cough or wheezing. Denies chest pains, palpitations and leg swelling Denies abdominal pain, nausea, vomiting,diarrhea or constipation.   Denies dysuria, frequency, hesitancy or incontinence. c/o chronic limitation  in mobility. Denies headaches, seizures, numbness, or tingling. Denies depression, anxiety or insomnia. Denies skin break down or rash.   PE  BP 122/74 (BP Location: Right Wrist, Patient Position: Sitting, Cuff Size: Normal)   Pulse (!) 104   Temp 97.8 F (36.6 C) (Temporal)   Resp 18   Ht 5\' 10"  (1.778 m)   Wt (!) 391 lb (177.4 kg)   SpO2 95%   BMI 56.10 kg/m   Patient alert and oriented and in no cardiopulmonary distress.  HEENT: No facial asymmetry, EOMI,   oropharynx pink and moist.  Neck supple no JVD, no mass.  Chest: Clear to auscultation bilaterally. Breast: no assymetry, no mass or nipple discharge, no nipple inversion, no axillary or supraclavicular nodes CVS: S1, S2 no murmurs, no S3.Regular rate.  ABD: Soft non tender. No appreciable mass or organomegaly , normal BS. Exam limited by extremely large body habitus Heme negative stool, no mass   Pelvic: Female  distribution of pubic hair, thick vaginal discharge and tinea cruris. Uterus absent, no adnexal mass palpated, LIMITED EXAM, no adnexal tenderness. No ulcers on vaginal wall Ext: No edema  MS: Decreased  ROM spine,  Normal in shoulders, hips and knees  Skin callus on both feet, and severe bilateral onychomycosisntact, no ulcerations   Psych: Good eye contact, normal affect. Memory intact not anxious or depressed appearing.  CNS: CN 2-12 intact, power,  normal throughout.no focal deficits noted.   Assessment & Plan  Annual physical exam Annual exam as documented. Counseling done  re healthy lifestyle involving commitment to 150 minutes exercise per week, heart healthy diet, and attaining healthy weight.The importance of adequate sleep also discussed. Regular seat belt use and home safety, is also discussed. Changes in health habits are decided on by the patient with goals and time frames  set for achieving them. Immunization and cancer screening needs are specifically addressed at this visit.   Diabetes mellitus type 2 in obese [facility]) Tracey [name] is reminded of the importance of commitment to daily physical activity for 30 minutes or more, as able and the need to limit carbohydrate intake to 30 to 60 grams per meal to help with blood sugar control.   The need to take medication as prescribed, test blood sugar as directed, and to call between visits if there is a concern that blood sugar is uncontrolled is also discussed.   Tracey [name] is reminded of the importance of daily foot exam, annual eye examination, and good blood sugar, blood pressure and cholesterol  control.  Diabetic Labs Latest Ref Rng & Units 07/13/2016 03/16/2016 11/14/2015 07/10/2015 04/10/2015  HbA1c <5.7 % 7.0(H) 7.0(H) 7.0(H) 7.0(H) -  Microalbumin Not estab mg/dL - - - - 6.9  Micro/Creat Ratio <30 mcg/mg creat - - - - 28  Chol <200 mg/dL 190 - 140 - -  HDL >50 mg/dL 40(L) - 42(L) - -  Calc LDL <100 mg/dL 127(H) - 83 -  -  Triglycerides <150 mg/dL 115 - 74 - -  Creatinine 0.50 - 1.05 mg/dL 0.75 0.70 0.78 - -   BP/Weight 07/15/2016 07/01/2016 05/12/2016 03/16/2016 02/10/2016 11/14/2015 11/28/7114  Systolic BP 579 - - 038 - 333 -  Diastolic BP 74 - - 82 - 76 -  Wt. (Lbs) 391 386 388 387.04 379.2 378 379  BMI 56.1 55.39 55.67 55.53 54.41 54.24 54.38   Foot/eye exam completion dates 07/15/2016 05/13/2015  Foot Form Completion Done Done        Onychomycosis Bilateral severe onychomycosis and long nails, terbinafine x 3 months and podiatry care  Hyperlipidemia LDL goal <100 Uncontrolled, dose increase in liptor and dietary modification Hyperlipidemia:Low fat diet discussed and encouraged.   Lipid Panel  Lab Results  Component Value Date   CHOL 190 07/13/2016   HDL 40 (L) 07/13/2016   LDLCALC 127 (H) 07/13/2016   TRIG 115 07/13/2016   CHOLHDL 4.8 07/13/2016

## 2016-07-15 NOTE — Assessment & Plan Note (Signed)
Bilateral severe onychomycosis and long nails, terbinafine x 3 months and podiatry care

## 2016-07-16 LAB — MICROALBUMIN / CREATININE URINE RATIO
CREATININE, UR: 151.2 mg/dL
Microalb Creat Ratio: 27.8 mg/g creat (ref 0.0–30.0)
Microalb, Ur: 42 ug/mL — ABNORMAL HIGH

## 2016-07-20 LAB — CYTOLOGY - PAP
Diagnosis: NEGATIVE
HPV: NOT DETECTED

## 2016-08-14 ENCOUNTER — Other Ambulatory Visit: Payer: Self-pay | Admitting: Internal Medicine

## 2016-08-19 DIAGNOSIS — H0260 Xanthelasma of unspecified eye, unspecified eyelid: Secondary | ICD-10-CM | POA: Diagnosis not present

## 2016-08-19 DIAGNOSIS — E119 Type 2 diabetes mellitus without complications: Secondary | ICD-10-CM | POA: Diagnosis not present

## 2016-08-19 LAB — HM DIABETES EYE EXAM

## 2016-08-26 ENCOUNTER — Telehealth: Payer: Self-pay

## 2016-08-26 NOTE — Telephone Encounter (Signed)
Patient voiced concern that the janumet caused excessive diarrhea and so she wasn't taking the med everyday and the pharmacy wanted to see if you could change it to something with less metformin or something different

## 2016-08-27 ENCOUNTER — Other Ambulatory Visit: Payer: Self-pay | Admitting: Family Medicine

## 2016-08-27 ENCOUNTER — Other Ambulatory Visit: Payer: Self-pay | Admitting: *Deleted

## 2016-08-27 MED ORDER — SITAGLIPTIN PHOSPHATE 100 MG PO TABS: 100.0000 mg | ORAL_TABLET | Freq: Every day | ORAL | 5 refills | Status: DC

## 2016-08-27 NOTE — Telephone Encounter (Signed)
I spoke directly with the patient , she states that she does not take the janumet a lot of the time due to loose stool, so I will change her to [place], this is sent in, in its place and pt is aware

## 2016-08-28 ENCOUNTER — Other Ambulatory Visit: Payer: Self-pay

## 2016-08-28 MED ORDER — SITAGLIPTIN PHOSPHATE 100 MG PO TABS: 100.0000 mg | ORAL_TABLET | Freq: Every day | ORAL | 5 refills | Status: DC

## 2016-09-09 ENCOUNTER — Encounter: Payer: Self-pay | Admitting: Podiatry

## 2016-09-09 ENCOUNTER — Other Ambulatory Visit: Payer: Self-pay | Admitting: Internal Medicine

## 2016-09-09 ENCOUNTER — Ambulatory Visit (INDEPENDENT_AMBULATORY_CARE_PROVIDER_SITE_OTHER): Payer: Medicare Other | Admitting: Podiatry

## 2016-09-09 VITALS — BP 126/78 | HR 101 | Resp 18

## 2016-09-09 DIAGNOSIS — E1142 Type 2 diabetes mellitus with diabetic polyneuropathy: Secondary | ICD-10-CM

## 2016-09-09 DIAGNOSIS — L608 Other nail disorders: Secondary | ICD-10-CM

## 2016-09-09 NOTE — Patient Instructions (Signed)

## 2016-09-09 NOTE — Progress Notes (Signed)
   Subjective:    Patient ID: Tracey [name], female    DOB: 04-02-1956, 61 y.o.   MRN: [id number]  HPI  g   This patient presents today requesting a general foot examination and debridement of toenails. Patient states she is a diabetic for approximately 2 years and describes a history of numbness and pain in your feet on a rather continuous basis. Patient states that she was last evaluated by podiatrist approximately year ago for nail debridement  Patient denies history of foot ulceration, claudication or amputation Patient denies smoking history   Review of Systems  Constitutional: Positive for fever and unexpected weight change.       Sweating  Eyes: Positive for redness, itching and visual disturbance.  Respiratory: Positive for shortness of breath.   Cardiovascular: Positive for chest pain.       Calf pain with walking  Gastrointestinal: Positive for diarrhea and nausea.  Endocrine:       Increase urination  Musculoskeletal: Positive for back pain and gait problem.       Joint and muscle pain  Neurological: Positive for dizziness and light-headedness.  All other systems reviewed and are negative.      Objective:   Physical Exam Patient is approximately 5 foot 10 and weighs approximately 379 pounds  Vascular: No edema noted bilaterally DP and PT pulses 2/4 bilaterally Capillary reflex immediate bilaterally  Neurological: Sensation to 10 g monofilament wire intact 3/5 right and 5 left Vibratory sensation nonreactive bilaterally Ankle reflex equal reactive bilaterally  Dermatological: No open skin lesions bilaterally Absent hair growth bilaterally Toenails are incurvated and elongated 6-10  Musculoskeletal: Manual motor testing dorsi flexion, plantar flexion, inversion, eversion 5/5 bilaterally        Assessment & Plan:   Assessment: Diabetic peripheral neuropathy Incurvated toenails 6-10  Plan: I reviewed the results of exam with patient today and  offered her debridement and she verbally consents. Toenails 6-10 were debrided mechanically and electronically without any bleeding  Reappoint 3 months

## 2016-09-14 ENCOUNTER — Telehealth: Payer: Self-pay | Admitting: Family Medicine

## 2016-09-14 NOTE — Telephone Encounter (Signed)
Patient is requesting a form that she can take to social services stating that she is a diabetic and needs assistance in paying her utilities.  Please let patient know if and when this could be done.  cb  [phone]

## 2016-09-15 NOTE — Telephone Encounter (Signed)
Letter typed

## 2016-09-30 ENCOUNTER — Other Ambulatory Visit (HOSPITAL_BASED_OUTPATIENT_CLINIC_OR_DEPARTMENT_OTHER): Payer: Self-pay

## 2016-09-30 DIAGNOSIS — G4733 Obstructive sleep apnea (adult) (pediatric): Secondary | ICD-10-CM

## 2016-10-07 ENCOUNTER — Ambulatory Visit: Payer: Medicare Other | Admitting: Nutrition

## 2016-10-15 ENCOUNTER — Other Ambulatory Visit: Payer: Self-pay | Admitting: Internal Medicine

## 2016-10-22 ENCOUNTER — Encounter: Payer: Medicare Other | Attending: Family Medicine | Admitting: Nutrition

## 2016-10-22 VITALS — Ht 70.0 in | Wt 382.0 lb

## 2016-10-22 DIAGNOSIS — E1165 Type 2 diabetes mellitus with hyperglycemia: Secondary | ICD-10-CM | POA: Diagnosis not present

## 2016-10-22 DIAGNOSIS — Z713 Dietary counseling and surveillance: Secondary | ICD-10-CM | POA: Diagnosis not present

## 2016-10-22 DIAGNOSIS — IMO0002 Reserved for concepts with insufficient information to code with codable children: Secondary | ICD-10-CM

## 2016-10-22 DIAGNOSIS — E118 Type 2 diabetes mellitus with unspecified complications: Secondary | ICD-10-CM | POA: Insufficient documentation

## 2016-10-22 NOTE — Patient Instructions (Addendum)
Goals 1. Eat protein of eggs or pb with breakfast 2. Increase walking to 15 minutes twice a day 3. Eat 2-3 servings of vegetables daily 4. Eat 2-3 servings of fruit Keep drinking water Lose 1 lb per wreek Increase protein with meals. Avoid snacks between meals.

## 2016-10-22 NOTE — Progress Notes (Signed)
  Diabetes Self-Management Education  Visit Type:    Appt. Start Time: 1100 Appt. End Time: 1130 10/22/2016  [name], identified by name and date of birth, is a 61 y.o. female with a diagnosis of Diabetes Type 2. Had DM for > 20 years. .Morbid Obesity BMI >55.  A1C is still 7%. 30 day avg BS lo 169 mg/dl 7 day avg, 14 day 185 mg/dl and 30 day 173 mg/dl.. Testing once a day in am.     Wt is down 4 lbs. Trying to walk a little more. Eating more salads and drinking more water than she ever has. Has has cut down on sweets a lot. Isn't having the cravings for sweets anymore. Taking Invokana, Januvia, and Glipizide.    Diet still work in progress. Attempting to increase fresh fruits and vegetables when available. Scheduled to have sleep study done in the next 2 weeks.    Lab Results  Component Value Date   HGBA1C 7.0 (H) 07/13/2016     CMP Latest Ref Rng & Units 07/13/2016 03/16/2016 11/14/2015  Glucose 65 - 99 mg/dL 151(H) 142(H) 141(H)  BUN 7 - 25 mg/dL 11 13 10   Creatinine 0.50 - 1.05 mg/dL 0.75 0.70 0.78  Sodium 135 - 146 mmol/L 138 137 139  Potassium 3.5 - 5.3 mmol/L 4.1 4.4 4.5  Chloride 98 - 110 mmol/L 103 102 104  CO2 20 - 31 mmol/L 21 22 26   Calcium 8.6 - 10.4 mg/dL 8.8 9.3 9.0  Total Protein 6.1 - 8.1 g/dL 7.5 7.5 7.3  Total Bilirubin 0.2 - 1.2 mg/dL 0.5 0.5 0.5  Alkaline Phos 33 - 130 U/L 61 73 69  AST 10 - 35 U/L 21 21 20   ALT 6 - 29 U/L 27 29 26      ASSESSMENT  Wt Readings from Last 3 Encounters:  07/15/16 (!) 391 lb (177.4 kg)  07/01/16 (!) 386 lb (175.1 kg)  05/12/16 (!) 388 lb (176 kg)   Ht Readings from Last 3 Encounters:  07/15/16 5\' 10"  (1.778 m)  07/01/16 5\' 10"  (1.778 m)  05/12/16 5\' 10"  (1.778 m)   There is no height or weight on file to calculate BMI.   B) Banana, water L) Toss salad, LF cheese, vinegette dressing, [place] D). Same as lunch., water    Individualized Plan for Diabetes Self-Management Training:   Learning Objective:   Patient will have a greater understanding of diabetes self-management. Patient education plan is to attend individual and/or group sessions per assessed needs and concerns.   Plan:  .  Goals 1. Eat protein of eggs or pb with breakfast 2. Increase walking to 15 minutes twice a day 3. Eat 2-3 servings of vegetables daily 4. Eat 2-3 servings of fruit Keep drinking water Lose 1 lb per wreek Increase protein with meals. Avoid snacks between meals.  Expected Outcomes:   Weight loss and A1C less than 7%   Education material provided: My Plate  If problems or questions, patient to contact team via:  Phone and Email  Future DSME appointment:   3 months.   Recommend to refer to Dr. Dorris Fetch, Endocrine to evaluate for thryoid and other endocrine issues.

## 2016-11-03 DIAGNOSIS — G4733 Obstructive sleep apnea (adult) (pediatric): Secondary | ICD-10-CM | POA: Diagnosis not present

## 2016-11-03 DIAGNOSIS — I10 Essential (primary) hypertension: Secondary | ICD-10-CM | POA: Diagnosis not present

## 2016-11-03 DIAGNOSIS — M5440 Lumbago with sciatica, unspecified side: Secondary | ICD-10-CM | POA: Diagnosis not present

## 2016-11-03 DIAGNOSIS — E119 Type 2 diabetes mellitus without complications: Secondary | ICD-10-CM | POA: Diagnosis not present

## 2016-11-03 DIAGNOSIS — R5383 Other fatigue: Secondary | ICD-10-CM | POA: Diagnosis not present

## 2016-11-16 ENCOUNTER — Ambulatory Visit (INDEPENDENT_AMBULATORY_CARE_PROVIDER_SITE_OTHER): Payer: Medicare Other

## 2016-11-16 ENCOUNTER — Ambulatory Visit: Payer: Medicare Other

## 2016-11-16 VITALS — BP 130/68 | HR 96 | Temp 98.9°F | Ht 70.0 in | Wt 382.1 lb

## 2016-11-16 DIAGNOSIS — Z0000 Encounter for general adult medical examination without abnormal findings: Secondary | ICD-10-CM

## 2016-11-16 NOTE — Progress Notes (Signed)
Subjective:   RADIE BERGES is a 61 y.o. female who presents for an Initial Medicare Annual Wellness Visit.  Review of Systems:  Cardiac Risk Factors include: diabetes mellitus;dyslipidemia;hypertension;obesity (BMI >30kg/m2);sedentary lifestyle     Objective:    Today's Vitals   11/16/16 1416  BP: 130/68  Pulse: 96  Temp: 98.9 F (37.2 C)  TempSrc: Oral  Weight: (!) 382 lb 1.3 oz (173.3 kg)  Height: 5\' 10"  (1.778 m)   Body mass index is 54.82 kg/m.   Current Medications (verified) Outpatient Encounter Prescriptions as of 11/16/2016  Medication Sig  . ACCU-CHEK SOFTCLIX LANCETS lancets Use as instructed twice daily dx E11.65  . amLODipine (NORVASC) 2.5 MG tablet TAKE ONE (1) TABLET BY MOUTH EVERY DAY  . aspirin EC 81 MG tablet Take 1 tablet (81 mg total) by mouth daily.  Marland Kitchen atorvastatin (LIPITOR) 80 MG tablet Take 1 tablet (80 mg total) by mouth daily.  Marland Kitchen glipiZIDE (GLUCOTROL XL) 5 MG 24 hr tablet TAKE ONE TABLET EACH DAY WITH BREAKFAST  . glucose blood (ACCU-CHEK AVIVA) test strip Use as instructed twice daily dx E11.65  . ibuprofen (ADVIL,MOTRIN) 200 MG tablet Take 200 mg by mouth every 8 (eight) hours as needed for moderate pain.   . INVOKANA 300 MG TABS tablet TAKE ONE (1) TABLET EACH DAY BEFORE BREAKFAST  . metoprolol tartrate (LOPRESSOR) 25 MG tablet TAKE ONE TABLET TWICE DAILY  . omeprazole (PRILOSEC) 20 MG capsule TAKE ONE CAPSULE ONCE DAILY  . sitaGLIPtin (JANUVIA) 100 MG tablet Take 1 tablet (100 mg total) by mouth daily.  Marland Kitchen spironolactone (ALDACTONE) 25 MG tablet TAKE ONE (1) TABLET BY MOUTH EVERY DAY  . ergocalciferol (VITAMIN D2) 50000 units capsule Take 1 capsule (50,000 Units total) by mouth once a week. One capsule once weekly (Patient not taking: Reported on 11/16/2016)  . [DISCONTINUED] fluconazole (DIFLUCAN) 100 MG tablet Take 1 tablet (100 mg total) by mouth daily.  . [DISCONTINUED] terbinafine (LAMISIL) 250 MG tablet Take 1 tablet (250 mg total) by  mouth daily. (Patient not taking: Reported on 11/16/2016)   No facility-administered encounter medications on file as of 11/16/2016.     Allergies (verified) Haloperidol lactate   History: Past Medical History:  Diagnosis Date  . Anginal pain ([place])   . Arthritis   . Diabetes mellitus without complication ([place])   . Diastolic dysfunction 10/4079   Grade 1  . GERD (gastroesophageal reflux disease)   . Hyperlipidemia   . Hyperlipidemia   . Hypertension 04/15/2012  . Morbid obesity ([place])   . PE (pulmonary embolism)   . Pulmonary emboli ([place]) 1999   post TAH  . Pulmonary embolism ([place])   . Sleep apnea    Past Surgical History:  Procedure Laterality Date  . ABDOMINAL HYSTERECTOMY  1999   , totalfor fibroids  . CESAREAN SECTION    . CHOLECYSTECTOMY  1996  . COLONOSCOPY N/A 10/31/2015   Procedure: COLONOSCOPY;  Surgeon: Rogene Houston, MD;  Location: [facility];  Service: Endoscopy;  Laterality: N/A;  930   Family History  Problem Relation Age of Onset  . Diabetes Mother   . Hypertension Mother   . Depression Mother   . Hyperlipidemia Father   . Hypertension Father   . Alcohol abuse Father   . Cirrhosis Father   . Aneurysm Brother   . Sleep apnea Sister   . Arthritis Sister   . Coronary artery disease Sister   . Heart attack Sister 2  . Heart  murmur Son   . Hypertension Son   . Depression Son    Social History   Occupational History  . Not on file.   Social History Main Topics  . Smoking status: Never Smoker  . Smokeless tobacco: Never Used  . Alcohol use No  . Drug use: No  . Sexual activity: Not Currently    Tobacco Counseling Counseling given: Not Answered   Activities of Daily Living In your present state of health, do you have any difficulty performing the following activities: 11/16/2016 07/15/2016  Hearing? N N  Vision? N N  Difficulty concentrating or making decisions? N N  Walking or climbing stairs? Y N  Comment tires easily -  Dressing or  bathing? N N  Doing errands, shopping? N N  Preparing Food and eating ? N -  Using the Toilet? N -  In the past six months, have you accidently leaked urine? N -  Do you have problems with loss of bowel control? N -  Managing your Medications? N -  Managing your Finances? N -  Housekeeping or managing your Housekeeping? N -  Some recent data might be hidden    Immunizations and Health Maintenance Immunization History  Administered Date(s) Administered  . Influenza,inj,Quad PF,36+ Mos 04/01/2015, 03/16/2016  . Pneumococcal Polysaccharide-23 05/13/2015   There are no preventive care reminders to display for this patient.  Patient Care Team: Fayrene Helper, MD as PCP - General (Family Medicine) Fay Records, MD as Consulting Physician (Cardiology) Phillips Odor, MD as Consulting Physician (Neurology) Rutherford Guys, MD as Consulting Physician (Ophthalmology)  Indicate any recent Medical Services you may have received from other than [facility] providers in the past year (date may be approximate).     Assessment:   This is a routine wellness examination for Alahia.  Hearing/Vision screen Vision Screening Comments: Continue follow up with Dr. Gershon Crane   Dietary issues and exercise activities discussed: Current Exercise Habits: The patient does not participate in regular exercise at present, Exercise limited by: None identified  Goals    . Exercise 3x per week (30 min per time)          Recommend starting a routine exercise program at least 3 days a week for 30-45 minutes at a time as tolerated.        Depression Screen PHQ 2/9 Scores 11/16/2016 10/22/2016 07/15/2016 05/12/2016 03/16/2016 07/18/2015 06/10/2015  PHQ - 2 Score 0 0 0 0 0 0 0  PHQ- 9 Score - - - - - - -    Fall Risk Fall Risk  11/16/2016 10/22/2016 07/15/2016 05/12/2016 03/16/2016  Falls in the past year? No No No No No  Number falls in past yr: - - - - -  Comment - - - - -  Injury with Fall? - - - - -  Risk for fall  due to : - - - - -  Follow up - - - - -    Cognitive Function: Normal   6CIT Screen 11/16/2016  What Year? 0 points  What month? 0 points  What time? 0 points  Count back from 20 0 points  Months in reverse 0 points  Repeat phrase 0 points  Total Score 0    Screening Tests Health Maintenance  Topic Date Due  . INFLUENZA VACCINE  12/14/2016 (Originally 11/11/2016)  . TETANUS/TDAP  08/27/2017 (Originally 06/07/1976)  . HEMOGLOBIN A1C  01/12/2017  . FOOT EXAM  07/15/2017  . URINE MICROALBUMIN  07/15/2017  .  OPHTHALMOLOGY EXAM  08/19/2017  . MAMMOGRAM  06/04/2018  . PAP SMEAR  07/16/2019  . PNEUMOCOCCAL POLYSACCHARIDE VACCINE (2) 05/12/2020  . COLONOSCOPY  10/30/2025  . Hepatitis C Screening  Completed  . HIV Screening  Completed      Plan:   I have personally reviewed and noted the following in the patient's chart:   . Medical and social history . Use of alcohol, tobacco or illicit drugs  . Current medications and supplements . Functional ability and status . Nutritional status . Physical activity . Advanced directives . List of other physicians . Hospitalizations, surgeries, and ER visits in previous 12 months . Vitals . Screenings to include cognitive, depression, and falls . Referrals and appointments  In addition, I have reviewed and discussed with patient certain preventive protocols, quality metrics, and best practice recommendations. A written personalized care plan for preventive services as well as general preventive health recommendations were provided to patient.     Stormy Fabian, LPN   0/06/2120

## 2016-11-16 NOTE — Patient Instructions (Addendum)
[name] , Thank you for taking time to come for your Medicare Wellness Visit. I appreciate your ongoing commitment to your health goals. Please review the following plan we discussed and let me know if I can assist you in the future.   Screening recommendations/referrals: Colonoscopy: Up to date, next due 10/2025 Mammogram: Up to date, next due 05/2017 Bone Density: Due at age 61 Diabetic Eye Exam: Up to date, next due 08/2017 Recommended yearly dental visit for hygiene and checkup  Vaccinations: Influenza vaccine: Due next month Pneumococcal vaccine: Prevnar vaccine due at age 71 Tdap vaccine: Due, declines Shingles vaccine: Due, declines    Advanced directives: Advance directive discussed with you today. I have provided a copy for you to complete at home and have notarized. Once this is complete please bring a copy in to our office so we can scan it into your chart.  Conditions/risks identified: Obese, recommend starting a routine exercise program at least 3 days a week for 30-45 minutes at a time as tolerated.   Next appointment: Follow up with Dr. Moshe Cipro on 01/20/2017 at 10:20 am. Follow up in 1 year for your annual wellness visit.  Preventive Care 40-64 Years, Female Preventive care refers to lifestyle choices and visits with your health care provider that can promote health and wellness. What does preventive care include?  A yearly physical exam. This is also called an annual well check.  Dental exams once or twice a year.  Routine eye exams. Ask your health care provider how often you should have your eyes checked.  Personal lifestyle choices, including:  Daily care of your teeth and gums.  Regular physical activity.  Eating a healthy diet.  Avoiding tobacco and drug use.  Limiting alcohol use.  Practicing safe sex.  Taking low-dose aspirin daily starting at age 28.  Taking vitamin and mineral supplements as recommended by your health care provider. What  happens during an annual well check? The services and screenings done by your health care provider during your annual well check will depend on your age, overall health, lifestyle risk factors, and family history of disease. Counseling  Your health care provider may ask you questions about your:  Alcohol use.  Tobacco use.  Drug use.  Emotional well-being.  Home and relationship well-being.  Sexual activity.  Eating habits.  Work and work Statistician.  Method of birth control.  Menstrual cycle.  Pregnancy history. Screening  You may have the following tests or measurements:  Height, weight, and BMI.  Blood pressure.  Lipid and cholesterol levels. These may be checked every 5 years, or more frequently if you are over 62 years old.  Skin check.  Lung cancer screening. You may have this screening every year starting at age 34 if you have a 30-pack-year history of smoking and currently smoke or have quit within the past 15 years.  Fecal occult blood test (FOBT) of the stool. You may have this test every year starting at age 73.  Flexible sigmoidoscopy or colonoscopy. You may have a sigmoidoscopy every 5 years or a colonoscopy every 10 years starting at age 46.  Hepatitis C blood test.  Hepatitis B blood test.  Sexually transmitted disease (STD) testing.  Diabetes screening. This is done by checking your blood sugar (glucose) after you have not eaten for a while (fasting). You may have this done every 1-3 years.  Mammogram. This may be done every 1-2 years. Talk to your health care provider about when you should start  having regular mammograms. This may depend on whether you have a family history of breast cancer.  BRCA-related cancer screening. This may be done if you have a family history of breast, ovarian, tubal, or peritoneal cancers.  Pelvic exam and Pap test. This may be done every 3 years starting at age 5. Starting at age 70, this may be done every 5 years  if you have a Pap test in combination with an HPV test.  Bone density scan. This is done to screen for osteoporosis. You may have this scan if you are at high risk for osteoporosis. Discuss your test results, treatment options, and if necessary, the need for more tests with your health care provider. Vaccines  Your health care provider may recommend certain vaccines, such as:  Influenza vaccine. This is recommended every year.  Tetanus, diphtheria, and acellular pertussis (Tdap, Td) vaccine. You may need a Td booster every 10 years.  Zoster vaccine. You may need this after age 14.  Pneumococcal 13-valent conjugate (PCV13) vaccine. You may need this if you have certain conditions and were not previously vaccinated.  Pneumococcal polysaccharide (PPSV23) vaccine. You may need one or two doses if you smoke cigarettes or if you have certain conditions. Talk to your health care provider about which screenings and vaccines you need and how often you need them. This information is not intended to replace advice given to you by your health care provider. Make sure you discuss any questions you have with your health care provider. Document Released: 04/26/2015 Document Revised: 12/18/2015 Document Reviewed: 01/29/2015 Elsevier Interactive Patient Education  2017 [place] Prevention in the Home Falls can cause injuries. They can happen to people of all ages. There are many things you can do to make your home safe and to help prevent falls. What can I do on the outside of my home?  Regularly fix the edges of walkways and driveways and fix any cracks.  Remove anything that might make you trip as you walk through a door, such as a raised step or threshold.  Trim any bushes or trees on the path to your home.  Use bright outdoor lighting.  Clear any walking paths of anything that might make someone trip, such as rocks or tools.  Regularly check to see if handrails are loose or  broken. Make sure that both sides of any steps have handrails.  Any raised decks and porches should have guardrails on the edges.  Have any leaves, snow, or ice cleared regularly.  Use sand or salt on walking paths during winter.  Clean up any spills in your garage right away. This includes oil or grease spills. What can I do in the bathroom?  Use night lights.  Install grab bars by the toilet and in the tub and shower. Do not use towel bars as grab bars.  Use non-skid mats or decals in the tub or shower.  If you need to sit down in the shower, use a plastic, non-slip stool.  Keep the floor dry. Clean up any water that spills on the floor as soon as it happens.  Remove soap buildup in the tub or shower regularly.  Attach bath mats securely with double-sided non-slip rug tape.  Do not have throw rugs and other things on the floor that can make you trip. What can I do in the bedroom?  Use night lights.  Make sure that you have a light by your bed that is easy to  reach.  Do not use any sheets or blankets that are too big for your bed. They should not hang down onto the floor.  Have a firm chair that has side arms. You can use this for support while you get dressed.  Do not have throw rugs and other things on the floor that can make you trip. What can I do in the kitchen?  Clean up any spills right away.  Avoid walking on wet floors.  Keep items that you use a lot in easy-to-reach places.  If you need to reach something above you, use a strong step stool that has a grab bar.  Keep electrical cords out of the way.  Do not use floor polish or wax that makes floors slippery. If you must use wax, use non-skid floor wax.  Do not have throw rugs and other things on the floor that can make you trip. What can I do with my stairs?  Do not leave any items on the stairs.  Make sure that there are handrails on both sides of the stairs and use them. Fix handrails that are  broken or loose. Make sure that handrails are as long as the stairways.  Check any carpeting to make sure that it is firmly attached to the stairs. Fix any carpet that is loose or worn.  Avoid having throw rugs at the top or bottom of the stairs. If you do have throw rugs, attach them to the floor with carpet tape.  Make sure that you have a light switch at the top of the stairs and the bottom of the stairs. If you do not have them, ask someone to add them for you. What else can I do to help prevent falls?  Wear shoes that:  Do not have high heels.  Have rubber bottoms.  Are comfortable and fit you well.  Are closed at the toe. Do not wear sandals.  If you use a stepladder:  Make sure that it is fully opened. Do not climb a closed stepladder.  Make sure that both sides of the stepladder are locked into place.  Ask someone to hold it for you, if possible.  Clearly mark and make sure that you can see:  Any grab bars or handrails.  First and last steps.  Where the edge of each step is.  Use tools that help you move around (mobility aids) if they are needed. These include:  Canes.  Walkers.  Scooters.  Crutches.  Turn on the lights when you go into a dark area. Replace any light bulbs as soon as they burn out.  Set up your furniture so you have a clear path. Avoid moving your furniture around.  If any of your floors are uneven, fix them.  If there are any pets around you, be aware of where they are.  Review your medicines with your doctor. Some medicines can make you feel dizzy. This can increase your chance of falling. Ask your doctor what other things that you can do to help prevent falls. This information is not intended to replace advice given to you by your health care provider. Make sure you discuss any questions you have with your health care provider. Document Released: 01/24/2009 Document Revised: 09/05/2015 Document Reviewed: 05/04/2014 Elsevier  Interactive Patient Education  2017 [employer].

## 2016-11-23 ENCOUNTER — Ambulatory Visit: Payer: Medicare Other | Attending: Neurology | Admitting: Neurology

## 2016-11-23 DIAGNOSIS — Z79899 Other long term (current) drug therapy: Secondary | ICD-10-CM | POA: Insufficient documentation

## 2016-11-23 DIAGNOSIS — Z7982 Long term (current) use of aspirin: Secondary | ICD-10-CM | POA: Diagnosis not present

## 2016-11-23 DIAGNOSIS — Z7984 Long term (current) use of oral hypoglycemic drugs: Secondary | ICD-10-CM | POA: Diagnosis not present

## 2016-11-23 DIAGNOSIS — R0683 Snoring: Secondary | ICD-10-CM | POA: Insufficient documentation

## 2016-11-23 DIAGNOSIS — R5383 Other fatigue: Secondary | ICD-10-CM | POA: Diagnosis not present

## 2016-11-23 DIAGNOSIS — G4733 Obstructive sleep apnea (adult) (pediatric): Secondary | ICD-10-CM | POA: Diagnosis not present

## 2016-11-23 DIAGNOSIS — M5440 Lumbago with sciatica, unspecified side: Secondary | ICD-10-CM | POA: Diagnosis not present

## 2016-11-23 DIAGNOSIS — E119 Type 2 diabetes mellitus without complications: Secondary | ICD-10-CM | POA: Diagnosis not present

## 2016-11-23 DIAGNOSIS — I10 Essential (primary) hypertension: Secondary | ICD-10-CM | POA: Diagnosis not present

## 2016-12-04 NOTE — Procedures (Signed)
  Horace A. Merlene Laughter, MD     [email or web address]             NOCTURNAL POLYSOMNOGRAPHY   LOCATION: [facility]  Patient Name: Tracey [name] Date: 11/23/2016 Gender: Female D.O.B: 03/07/1980 Age (years): 36 Referring Provider: Izora Gala Height (inches): 75 Interpreting Physician: Phillips Odor MD, ABSM Weight (lbs): 195 RPSGT: Rosebud Poles BMI: 29 MRN: [id number] Neck Size: 17.00 CLINICAL INFORMATION Sleep Study Type: HST  Indication for sleep study: Fatigue, Snoring  Epworth Sleepiness Score: NA  SLEEP STUDY TECHNIQUE A multi-channel overnight portable sleep study was performed. The channels recorded were: nasal airflow, thoracic respiratory movement, and oxygen saturation with a pulse oximetry. Snoring was also monitored.  MEDICATIONS Patient self administered medications include: N/A.  Current Outpatient Prescriptions:  .  ACCU-CHEK SOFTCLIX LANCETS lancets, Use as instructed twice daily dx E11.65, Disp: 100 each, Rfl: 5 .  amLODipine (NORVASC) 2.5 MG tablet, TAKE ONE (1) TABLET BY MOUTH EVERY DAY, Disp: 30 tablet, Rfl: 1 .  aspirin EC 81 MG tablet, Take 1 tablet (81 mg total) by mouth daily., Disp: 30 tablet, Rfl: 5 .  atorvastatin (LIPITOR) 80 MG tablet, Take 1 tablet (80 mg total) by mouth daily., Disp: 30 tablet, Rfl: 5 .  ergocalciferol (VITAMIN D2) 50000 units capsule, Take 1 capsule (50,000 Units total) by mouth once a week. One capsule once weekly (Patient not taking: Reported on 11/16/2016), Disp: 12 capsule, Rfl: 1 .  glipiZIDE (GLUCOTROL XL) 5 MG 24 hr tablet, TAKE ONE TABLET EACH DAY WITH BREAKFAST, Disp: 90 tablet, Rfl: 1 .  glucose blood (ACCU-CHEK AVIVA) test strip, Use as instructed twice daily dx E11.65, Disp: 100 each, Rfl: 5 .  ibuprofen (ADVIL,MOTRIN) 200 MG tablet, Take 200 mg by mouth every 8 (eight) hours as needed for moderate pain. , Disp: , Rfl:  .  INVOKANA 300 MG TABS tablet, TAKE ONE (1) TABLET EACH DAY BEFORE  BREAKFAST, Disp: 90 tablet, Rfl: 1 .  metoprolol tartrate (LOPRESSOR) 25 MG tablet, TAKE ONE TABLET TWICE DAILY, Disp: 180 tablet, Rfl: 3 .  omeprazole (PRILOSEC) 20 MG capsule, TAKE ONE CAPSULE ONCE DAILY, Disp: 90 capsule, Rfl: 1 .  sitaGLIPtin (JANUVIA) 100 MG tablet, Take 1 tablet (100 mg total) by mouth daily., Disp: 30 tablet, Rfl: 5 .  spironolactone (ALDACTONE) 25 MG tablet, TAKE ONE (1) TABLET BY MOUTH EVERY DAY, Disp: 30 tablet, Rfl: 3   SLEEP ARCHITECTURE Patient was studied for 473.0 minutes. The sleep efficiency was 98.6 % and the patient was supine for 57.9%. The arousal index was 0.9 per hour.  RESPIRATORY PARAMETERS The overall AHI was 5 per hour, with a central apnea index of 0.0 per hour.  The oxygen nadir was 87% during sleep.     CARDIAC DATA Mean heart rate during sleep was 73.3 bpm.  IMPRESSIONS - Mild obstructive sleep apnea (AHI = 5/h) is observed but does not require a positive pressure treatment.   Delano Metz, MD Diplomate, American Board of Sleep Medicine.  ELECTRONICALLY SIGNED ON:  12/04/2016, 6:50 PM [place] PH: [phone]   FX: [phone] [place]

## 2016-12-07 ENCOUNTER — Ambulatory Visit: Payer: Medicare Other | Admitting: Podiatry

## 2016-12-09 ENCOUNTER — Ambulatory Visit: Payer: Medicare Other | Admitting: Podiatry

## 2017-01-04 ENCOUNTER — Ambulatory Visit: Payer: Medicare Other | Admitting: Podiatry

## 2017-01-09 ENCOUNTER — Other Ambulatory Visit: Payer: Self-pay | Admitting: Family Medicine

## 2017-01-15 DIAGNOSIS — Z1379 Encounter for other screening for genetic and chromosomal anomalies: Secondary | ICD-10-CM | POA: Diagnosis not present

## 2017-01-15 DIAGNOSIS — I25118 Atherosclerotic heart disease of native coronary artery with other forms of angina pectoris: Secondary | ICD-10-CM | POA: Diagnosis not present

## 2017-01-15 DIAGNOSIS — E118 Type 2 diabetes mellitus with unspecified complications: Secondary | ICD-10-CM | POA: Diagnosis not present

## 2017-01-18 ENCOUNTER — Ambulatory Visit: Payer: Medicare Other | Admitting: Podiatry

## 2017-01-20 ENCOUNTER — Encounter: Payer: Self-pay | Admitting: Family Medicine

## 2017-01-20 ENCOUNTER — Other Ambulatory Visit: Payer: Self-pay

## 2017-01-20 ENCOUNTER — Ambulatory Visit (INDEPENDENT_AMBULATORY_CARE_PROVIDER_SITE_OTHER): Payer: Medicare Other | Admitting: Family Medicine

## 2017-01-20 VITALS — BP 120/74 | HR 98 | Temp 104.0°F | Resp 16 | Ht 70.0 in | Wt 383.0 lb

## 2017-01-20 DIAGNOSIS — Z23 Encounter for immunization: Secondary | ICD-10-CM

## 2017-01-20 DIAGNOSIS — I10 Essential (primary) hypertension: Secondary | ICD-10-CM

## 2017-01-20 DIAGNOSIS — E785 Hyperlipidemia, unspecified: Secondary | ICD-10-CM

## 2017-01-20 DIAGNOSIS — E669 Obesity, unspecified: Secondary | ICD-10-CM

## 2017-01-20 DIAGNOSIS — E559 Vitamin D deficiency, unspecified: Secondary | ICD-10-CM | POA: Diagnosis not present

## 2017-01-20 DIAGNOSIS — E1169 Type 2 diabetes mellitus with other specified complication: Secondary | ICD-10-CM | POA: Diagnosis not present

## 2017-01-20 MED ORDER — ATORVASTATIN CALCIUM 80 MG PO TABS: 80.0000 mg | ORAL_TABLET | Freq: Every day | ORAL | 1 refills | Status: DC

## 2017-01-20 NOTE — Assessment & Plan Note (Signed)
Hyperlipidemia:Low fat diet discussed and encouraged.   Lipid Panel  Lab Results  Component Value Date   CHOL 190 07/13/2016   HDL 40 (L) 07/13/2016   LDLCALC 127 (H) 07/13/2016   TRIG 115 07/13/2016   CHOLHDL 4.8 07/13/2016  uncontrolled Updated lab needed .

## 2017-01-20 NOTE — Assessment & Plan Note (Signed)
Updated lab needed at/ before next visit. Tracey [name] is reminded of the importance of commitment to daily physical activity for 30 minutes or more, as able and the need to limit carbohydrate intake to 30 to 60 grams per meal to help with blood sugar control.   The need to take medication as prescribed, test blood sugar as directed, and to call between visits if there is a concern that blood sugar is uncontrolled is also discussed.   Tracey [name] is reminded of the importance of daily foot exam, annual eye examination, and good blood sugar, blood pressure and cholesterol control.  Diabetic Labs Latest Ref Rng & Units 07/15/2016 07/13/2016 03/16/2016 11/14/2015 07/10/2015  HbA1c <5.7 % - 7.0(H) 7.0(H) 7.0(H) 7.0(H)  Microalbumin Not Estab. ug/mL 42.0(H) - - - -  Micro/Creat Ratio 0.0 - 30.0 mg/g creat 27.8 - - - -  Chol <200 mg/dL - 190 - 140 -  HDL >50 mg/dL - 40(L) - 42(L) -  Calc LDL <100 mg/dL - 127(H) - 83 -  Triglycerides <150 mg/dL - 115 - 74 -  Creatinine 0.50 - 1.05 mg/dL - 0.75 0.70 0.78 -   BP/Weight 01/20/2017 11/16/2016 10/22/2016 09/09/2016 07/15/2016 07/01/2016 6/33/3545  Systolic BP 625 638 - 937 342 - -  Diastolic BP 74 68 - 78 74 - -  Wt. (Lbs) 383 382.08 382 - 391 386 388  BMI 54.95 54.82 54.81 - 56.1 55.39 55.67   Foot/eye exam completion dates Latest Ref Rng & Units 08/19/2016 07/15/2016  Eye Exam No Retinopathy No Retinopathy -  Foot Form Completion - - Done

## 2017-01-20 NOTE — Progress Notes (Signed)
Tracey [name]     MRN: [id number]      DOB: 1955/05/04   HPI [name] is here for follow up and re-evaluation of chronic medical conditions, medication management and review of any available recent lab and radiology data.  Preventive health is updated, specifically  Cancer screening and Immunization.   Questions or concerns regarding consultations or procedures which the PT has had in the interim are  addressed. The PT denies any adverse reactions to current medications since the last visit.  There are no new concerns.  There are no specific complaints  Denies polyuria, polydipsia, blurred vision , or hypoglycemic episodes.   ROS Denies recent fever or chills. Denies sinus pressure, nasal congestion, ear pain or sore throat. Denies chest congestion, productive cough or wheezing. Denies chest pains, palpitations and leg swelling Denies abdominal pain, nausea, vomiting,diarrhea or constipation.   Denies dysuria, frequency, hesitancy or incontinence. Denies joint pain, swelling and limitation in mobility. Denies headaches, seizures, numbness, or tingling. Denies depression, anxiety or insomnia. Denies skin break down or rash.   PE  BP 120/74 (BP Location: Right Arm, Patient Position: Sitting, Cuff Size: Normal)   Pulse 98   Temp (!) 104 F (40 C) (Other (Comment))   Resp 16   Ht 5\' 10"  (1.778 m)   Wt (!) 383 lb (173.7 kg)   SpO2 98%   BMI 54.95 kg/m   Patient alert and oriented and in no cardiopulmonary distress.  HEENT: No facial asymmetry, EOMI,   oropharynx pink and moist.  Neck supple no JVD, no mass.  Chest: Clear to auscultation bilaterally.  CVS: S1, S2 no murmurs, no S3.Regular rate.  ABD: Soft non tender.   Ext: No edema  MS: Adequate ROM spine, shoulders, hips and knees.  Skin: Intact, no ulcerations or rash noted.  Psych: Good eye contact, normal affect. Memory intact not anxious or depressed appearing.  CNS: CN 2-12 intact, power,  normal  throughout.no focal deficits noted.   Assessment & Plan  Hypertension Controlled, no change in medication DASH diet and commitment to daily physical activity for a minimum of 30 minutes discussed and encouraged, as a part of hypertension management. The importance of attaining a healthy weight is also discussed.  BP/Weight 01/20/2017 11/16/2016 10/22/2016 09/09/2016 07/15/2016 07/01/2016 10/21/6267  Systolic BP 485 462 - 703 500 - -  Diastolic BP 74 68 - 78 74 - -  Wt. (Lbs) 383 382.08 382 - 391 386 388  BMI 54.95 54.82 54.81 - 56.1 55.39 55.67       Morbid obesity Deteriorated. Patient re-educated about  the importance of commitment to a  minimum of 150 minutes of exercise per week.  The importance of healthy food choices with portion control discussed. Encouraged to start a food diary, count calories and to consider  joining a support group. Sample diet sheets offered. Goals set by the patient for the next several months.   Weight /BMI 01/20/2017 11/16/2016 10/22/2016  WEIGHT 383 lb 382 lb 1.3 oz 382 lb  HEIGHT 5\' 10"  5\' 10"  5\' 10"   BMI 54.95 kg/m2 54.82 kg/m2 54.81 kg/m2      Hyperlipidemia LDL goal <100 Hyperlipidemia:Low fat diet discussed and encouraged.   Lipid Panel  Lab Results  Component Value Date   CHOL 190 07/13/2016   HDL 40 (L) 07/13/2016   LDLCALC 127 (H) 07/13/2016   TRIG 115 07/13/2016   CHOLHDL 4.8 07/13/2016  uncontrolled Updated lab needed .    Diabetes mellitus type  2 in obese Tracey [name]) Updated lab needed at/ before next visit. [name] is reminded of the importance of commitment to daily physical activity for 30 minutes or more, as able and the need to limit carbohydrate intake to 30 to 60 grams per meal to help with blood sugar control.   The need to take medication as prescribed, test blood sugar as directed, and to call between visits if there is a concern that blood sugar is uncontrolled is also discussed.   [name] is reminded of the  importance of daily foot exam, annual eye examination, and good blood sugar, blood pressure and cholesterol control.  Diabetic Labs Latest Ref Rng & Units 07/15/2016 07/13/2016 03/16/2016 11/14/2015 07/10/2015  HbA1c <5.7 % - 7.0(H) 7.0(H) 7.0(H) 7.0(H)  Microalbumin Not Estab. ug/mL 42.0(H) - - - -  Micro/Creat Ratio 0.0 - 30.0 mg/g creat 27.8 - - - -  Chol <200 mg/dL - 190 - 140 -  HDL >50 mg/dL - 40(L) - 42(L) -  Calc LDL <100 mg/dL - 127(H) - 83 -  Triglycerides <150 mg/dL - 115 - 74 -  Creatinine 0.50 - 1.05 mg/dL - 0.75 0.70 0.78 -   BP/Weight 01/20/2017 11/16/2016 10/22/2016 09/09/2016 07/15/2016 07/01/2016 01/03/3006  Systolic BP 622 633 - 354 562 - -  Diastolic BP 74 68 - 78 74 - -  Wt. (Lbs) 383 382.08 382 - 391 386 388  BMI 54.95 54.82 54.81 - 56.1 55.39 55.67   Foot/eye exam completion dates Latest Ref Rng & Units 08/19/2016 07/15/2016  Eye Exam No Retinopathy No Retinopathy -  Foot Form Completion - - Done

## 2017-01-20 NOTE — Assessment & Plan Note (Signed)
Controlled, no change in medication DASH diet and commitment to daily physical activity for a minimum of 30 minutes discussed and encouraged, as a part of hypertension management. The importance of attaining a healthy weight is also discussed.  BP/Weight 01/20/2017 11/16/2016 10/22/2016 09/09/2016 07/15/2016 07/01/2016 12/11/6740  Systolic BP 552 589 - 483 475 - -  Diastolic BP 74 68 - 78 74 - -  Wt. (Lbs) 383 382.08 382 - 391 386 388  BMI 54.95 54.82 54.81 - 56.1 55.39 55.67

## 2017-01-20 NOTE — Assessment & Plan Note (Signed)
Deteriorated. Patient re-educated about  the importance of commitment to a  minimum of 150 minutes of exercise per week.  The importance of healthy food choices with portion control discussed. Encouraged to start a food diary, count calories and to consider  joining a support group. Sample diet sheets offered. Goals set by the patient for the next several months.   Weight /BMI 01/20/2017 11/16/2016 10/22/2016  WEIGHT 383 lb 382 lb 1.3 oz 382 lb  HEIGHT 5\' 10"  5\' 10"  5\' 10"   BMI 54.95 kg/m2 54.82 kg/m2 54.81 kg/m2

## 2017-01-20 NOTE — Patient Instructions (Signed)
F/u in mid March, call if you need me before  Flu vaccine today  Discontinue weekly vitamiin D  Rwsults of todays labs will be mailed or called to you tomorrow  Labs for next visit to be ordered after current reviewed and mailed  Thanks for choosing [facility], we consider it a privelige to serve you.   Please work on good  health habits so that your health will improve. 1. Commitment to daily physical activity for 30 to 60  minutes, if you are able to do this.  2. Commitment to wise food choices. Aim for half of your  food intake to be vegetable and fruit, one quarter starchy foods, and one quarter protein. Try to eat on a regular schedule  3 meals per day, snacking between meals should be limited to vegetables or fruits or small portions of nuts. 64 ounces of water per day is generally recommended, unless you have specific health conditions, like heart failure or kidney failure where you will need to limit fluid intake.  3. Commitment to sufficient and a  good quality of physical and mental rest daily, generally between 6 to 8 hours per day.  WITH PERSISTANCE AND PERSEVERANCE, THE IMPOSSIBLE , BECOMES THE NORM! It is important that you exercise regularly at least 30 minutes 5 times a week. If you develop chest pain, have severe difficulty breathing, or feel very tired, stop exercising immediately and seek medical attention

## 2017-01-20 NOTE — Telephone Encounter (Signed)
Seen 10 10 18

## 2017-01-21 ENCOUNTER — Other Ambulatory Visit: Payer: Self-pay | Admitting: Family Medicine

## 2017-01-21 ENCOUNTER — Ambulatory Visit: Payer: Medicare Other | Admitting: Podiatry

## 2017-01-21 LAB — COMPLETE METABOLIC PANEL WITH GFR
AG RATIO: 1.1 (calc) (ref 1.0–2.5)
ALKALINE PHOSPHATASE (APISO): 86 U/L (ref 33–130)
ALT: 64 U/L — AB (ref 6–29)
AST: 55 U/L — AB (ref 10–35)
Albumin: 3.8 g/dL (ref 3.6–5.1)
BILIRUBIN TOTAL: 0.7 mg/dL (ref 0.2–1.2)
BUN/Creatinine Ratio: 7 (calc) (ref 6–22)
BUN: 6 mg/dL — ABNORMAL LOW (ref 7–25)
CALCIUM: 9.3 mg/dL (ref 8.6–10.4)
CHLORIDE: 97 mmol/L — AB (ref 98–110)
CO2: 27 mmol/L (ref 20–32)
Creat: 0.84 mg/dL (ref 0.50–1.05)
GFR, Est African American: 88 mL/min/{1.73_m2} (ref >=60)
GFR, Est Non African American: 76 mL/min/{1.73_m2} (ref >=60)
Globulin: 3.4 g/dL (calc) (ref 1.9–3.7)
Glucose, Bld: 242 mg/dL — ABNORMAL HIGH (ref 65–99)
POTASSIUM: 4.8 mmol/L (ref 3.5–5.3)
Sodium: 134 mmol/L — ABNORMAL LOW (ref 135–146)
Total Protein: 7.2 g/dL (ref 6.1–8.1)

## 2017-01-21 LAB — MICROALBUMIN / CREATININE URINE RATIO
Creatinine, Urine: 119 mg/dL (ref 20–275)
Microalb Creat Ratio: 61 mcg/mg creat — ABNORMAL HIGH (ref <30)
Microalb, Ur: 7.2 mg/dL

## 2017-01-21 LAB — HEMOGLOBIN A1C
HEMOGLOBIN A1C: 9.3 %{Hb} — AB (ref <5.7)
MEAN PLASMA GLUCOSE: 220 (calc)
eAG (mmol/L): 12.2 (calc)

## 2017-01-21 LAB — LIPID PANEL
CHOL/HDL RATIO: 3.9 (calc) (ref <5.0)
Cholesterol: 165 mg/dL (ref <200)
HDL: 42 mg/dL — AB (ref >50)
LDL Cholesterol (Calc): 104 mg/dL (calc) — ABNORMAL HIGH
NON-HDL CHOLESTEROL (CALC): 123 mg/dL (ref <130)
TRIGLYCERIDES: 98 mg/dL (ref <150)

## 2017-01-21 LAB — VITAMIN D 25 HYDROXY (VIT D DEFICIENCY, FRACTURES): VIT D 25 HYDROXY: 17 ng/mL — AB (ref 30–100)

## 2017-01-21 MED ORDER — METFORMIN HCL 1000 MG PO TABS: 1000.0000 mg | ORAL_TABLET | Freq: Two times a day (BID) | ORAL | 3 refills | Status: DC

## 2017-01-21 NOTE — Telephone Encounter (Signed)
-----  Message from Fayrene Helper, MD sent at 01/21/2017 10:47 AM EDT ----- I tried to speak directly with pt and left her a m,essage to call back to speak with the nurse as she needs to have changes in medication  Her blood sugar is now very uncontrolled , I needs to change eating habits, test once daily, review fBG goal of 80 to 120 with her please. I recommend addition of metformin 1000 mg one twice daily to current medications and needs to get f/u appt made to see me in 6 weeks with daily blood sugar log and will need cmp and EGFR drawn two days before the visit.  Needs to STOP the lipitor/ atorvastatin , though her lDL is higher than it should be as liver enzymes are increased   Work on weight loss and healthy eating1!!  Metformin is entered historically only, pls send after you speak wit her, thanks

## 2017-01-21 NOTE — Telephone Encounter (Signed)
Patient informed of message below, verbalized understanding. Labs ordered, Appt made, med sent

## 2017-01-27 ENCOUNTER — Other Ambulatory Visit: Payer: Self-pay | Admitting: Family Medicine

## 2017-01-27 ENCOUNTER — Encounter: Payer: Medicare Other | Attending: Family Medicine | Admitting: Nutrition

## 2017-01-27 VITALS — Ht 70.0 in | Wt 381.0 lb

## 2017-01-27 DIAGNOSIS — E118 Type 2 diabetes mellitus with unspecified complications: Secondary | ICD-10-CM | POA: Diagnosis not present

## 2017-01-27 DIAGNOSIS — E1165 Type 2 diabetes mellitus with hyperglycemia: Secondary | ICD-10-CM | POA: Diagnosis not present

## 2017-01-27 DIAGNOSIS — IMO0002 Reserved for concepts with insufficient information to code with codable children: Secondary | ICD-10-CM

## 2017-01-27 DIAGNOSIS — Z713 Dietary counseling and surveillance: Secondary | ICD-10-CM | POA: Insufficient documentation
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
## 2017-01-27 NOTE — Patient Instructions (Addendum)
Goals Go to food pantries for food Drink a lot more water Eat three meals per day  Don't skip meals Talk to Dr. Moshe Cipro about Invokana Increase fresh fruits and vegetables. Don't skip meals Make sure to take medications.

## 2017-01-28 ENCOUNTER — Other Ambulatory Visit: Payer: Self-pay | Admitting: Family Medicine

## 2017-01-28 ENCOUNTER — Telehealth: Payer: Self-pay | Admitting: Family Medicine

## 2017-01-28 MED ORDER — FLUCONAZOLE 150 MG PO TABS: ORAL_TABLET | ORAL | 2 refills | Status: DC

## 2017-01-28 NOTE — Telephone Encounter (Signed)
I spoke with the [p patient, FBG around 160 to 170. I have sent  In once weekly fluconazole 150 mg tablets as needed #4 refill x 2, she is aware

## 2017-01-28 NOTE — Telephone Encounter (Signed)
Patient went to diabetic exam yesterday and her blood sugar was up, the nurse told her to call doctor because a yeast infection can caus this, she is also having bad vaginal itching and burning. She states she has also started using a different soap.

## 2017-01-28 NOTE — Progress Notes (Signed)
fluconaz

## 2017-02-03 ENCOUNTER — Other Ambulatory Visit: Payer: Self-pay | Admitting: Family Medicine

## 2017-02-03 ENCOUNTER — Other Ambulatory Visit: Payer: Self-pay | Admitting: Internal Medicine

## 2017-02-15 ENCOUNTER — Encounter: Payer: Self-pay | Admitting: Podiatry

## 2017-02-15 ENCOUNTER — Ambulatory Visit (INDEPENDENT_AMBULATORY_CARE_PROVIDER_SITE_OTHER): Payer: Medicare Other | Admitting: Podiatry

## 2017-02-15 DIAGNOSIS — L608 Other nail disorders: Secondary | ICD-10-CM

## 2017-02-15 DIAGNOSIS — M2042 Other hammer toe(s) (acquired), left foot: Secondary | ICD-10-CM

## 2017-02-15 DIAGNOSIS — E1142 Type 2 diabetes mellitus with diabetic polyneuropathy: Secondary | ICD-10-CM

## 2017-02-15 DIAGNOSIS — M2041 Other hammer toe(s) (acquired), right foot: Secondary | ICD-10-CM

## 2017-02-15 NOTE — Patient Instructions (Signed)

## 2017-02-16 NOTE — Progress Notes (Signed)
Patient ID: Tracey [name], female   DOB: 12-16-1955, 61 y.o.   MRN: [id number]   Subjective: Patient presents today for a general foot examination and debridement of toenails. Patient states that her toenails are difficult to trim and she is requesting toenail debridement. Also, patient is requesting replacement diabetic shoes which she's had in the past and denies any shoeing this year.   Patient states she is a diabetic for approximately 2 years and describes a history of numbness and pain in your feet on a rather continuous basis. Patient states that she was last evaluated by podiatrist approximately year ago for nail debridement  Patient denies history of foot ulceration, claudication or amputation Patient denies smoking history  Objective:   Patient is approximately 5 foot 10 and weighs approximately 379 pounds  Vascular: No edema noted bilaterally DP and PT pulses 2/4 bilaterally Capillary reflex immediate bilaterally  Neurological: Sensation to 10 g monofilament wire intact 3/5 right and 5 left Vibratory sensation nonreactive bilaterally Ankle reflex equal reactive bilaterally  Dermatological: No open skin lesions bilaterally Absent hair growth bilaterally Toenails are incurvated and elongated 6-10  Musculoskeletal: Manual motor testing dorsi flexion, plantar flexion, inversion, eversion 5/5 bilaterally Adductovarus fifth toes/hammertoes bilaterally  Assessment: Diabetic peripheral neuropathy Hammertoe fifth bilaterally Incurvated toenails 6-10  Plan: Debrided toenails 6-10 mechanically electrical without a bleeding Obtain certification for diabetic shoes indication Diabetic neuropathy loss of vibratory and protective sensation Hammertoe fifth bilaterally  Reappoint 3 months in upon certification for diabetic shoes

## 2017-02-22 ENCOUNTER — Ambulatory Visit: Payer: Medicare Other | Admitting: Nutrition

## 2017-02-23 ENCOUNTER — Ambulatory Visit: Payer: Medicare Other

## 2017-03-01 ENCOUNTER — Other Ambulatory Visit: Payer: Self-pay | Admitting: Family Medicine

## 2017-03-01 DIAGNOSIS — E785 Hyperlipidemia, unspecified: Secondary | ICD-10-CM | POA: Diagnosis not present

## 2017-03-01 LAB — COMPLETE METABOLIC PANEL WITH GFR
AG Ratio: 1.2 (calc) (ref 1.0–2.5)
ALT: 29 U/L (ref 6–29)
AST: 30 U/L (ref 10–35)
Albumin: 3.7 g/dL (ref 3.6–5.1)
Alkaline phosphatase (APISO): 58 U/L (ref 33–130)
BUN: 9 mg/dL (ref 7–25)
CALCIUM: 8.8 mg/dL (ref 8.6–10.4)
CO2: 26 mmol/L (ref 20–32)
CREATININE: 0.72 mg/dL (ref 0.50–1.05)
Chloride: 105 mmol/L (ref 98–110)
GFR, EST AFRICAN AMERICAN: 106 mL/min/{1.73_m2} (ref >=60)
GFR, EST NON AFRICAN AMERICAN: 92 mL/min/{1.73_m2} (ref >=60)
Globulin: 3.2 g/dL (calc) (ref 1.9–3.7)
Glucose, Bld: 151 mg/dL — ABNORMAL HIGH (ref 65–99)
POTASSIUM: 4.4 mmol/L (ref 3.5–5.3)
Sodium: 141 mmol/L (ref 135–146)
TOTAL PROTEIN: 6.9 g/dL (ref 6.1–8.1)
Total Bilirubin: 0.5 mg/dL (ref 0.2–1.2)

## 2017-03-08 ENCOUNTER — Ambulatory Visit (INDEPENDENT_AMBULATORY_CARE_PROVIDER_SITE_OTHER): Payer: Medicare Other | Admitting: Family Medicine

## 2017-03-08 ENCOUNTER — Encounter: Payer: Self-pay | Admitting: Family Medicine

## 2017-03-08 VITALS — BP 120/80 | HR 98 | Resp 16 | Ht 70.0 in | Wt 382.0 lb

## 2017-03-08 DIAGNOSIS — I10 Essential (primary) hypertension: Secondary | ICD-10-CM

## 2017-03-08 DIAGNOSIS — E785 Hyperlipidemia, unspecified: Secondary | ICD-10-CM | POA: Diagnosis not present

## 2017-03-08 DIAGNOSIS — E1169 Type 2 diabetes mellitus with other specified complication: Secondary | ICD-10-CM | POA: Diagnosis not present

## 2017-03-08 DIAGNOSIS — E669 Obesity, unspecified: Secondary | ICD-10-CM | POA: Diagnosis not present

## 2017-03-08 MED ORDER — ATORVASTATIN CALCIUM 80 MG PO TABS: 80.0000 mg | ORAL_TABLET | Freq: Every day | ORAL | 3 refills | Status: DC

## 2017-03-08 NOTE — Progress Notes (Signed)
Tracey [name]     MRN: [id number]      DOB: 12-Dec-1955   HPI Tracey [name] is here for follow up and re-evaluation of chronic medical conditions, specifically uncontrolled blood sugar, medication management and review of any available recent lab and radiology data.  Preventive health is updated, specifically  Cancer screening and Immunization.   Questions or concerns regarding consultations or procedures which the PT has had in the interim are  addressed. The PT denies any adverse reactions to current medications since the last visit.  Denies polyuria, polydipsia, blurred vision , or hypoglycemic episodes.\ Morning blood sugars are on average 150, once it was 110, bedtime sugars 167  ROS Denies recent fever or chills. Denies sinus pressure, nasal congestion, ear pain or sore throat. Denies chest congestion, productive cough or wheezing. Denies chest pains, palpitations and leg swelling Denies abdominal pain, nausea, vomiting,diarrhea or constipation.   Denies dysuria, frequency, hesitancy or incontinence. Denies  Uncontrolled joint pain, swelling and limitation in mobility. Denies headaches, seizures, numbness, or tingling. Denies depression, anxiety or insomnia. Denies skin break down or rash.   PE  BP 140/82   Pulse (!) 108   Resp 16   Ht 5\' 10"  (1.778 m)   Wt (!) 382 lb (173.3 kg)   SpO2 97%   BMI 54.81 kg/m   Patient alert and oriented and in no cardiopulmonary distress.  HEENT: No facial asymmetry, EOMI,   oropharynx pink and moist.  Neck supple no JVD, no mass.  Chest: Clear to auscultation bilaterally.  CVS: S1, S2 no murmurs, no S3.Regular rate.  ABD: Soft non tender.   Ext: No edema  MS: Adequate though reduced  ROM spine, shoulders, hips and knees.  Skin: Intact, no ulcerations or rash noted.  Psych: Good eye contact, normal affect. Memory intact not anxious or depressed appearing.  CNS: CN 2-12 intact, power,  normal throughout.no focal deficits  noted.   Assessment & Plan Diabetes mellitus type 2 in obese (HCC) Uncontrolled ,however appear to be improving on current medication regime and pt is once again engaged in regular testing Tracey [name] is reminded of the importance of commitment to daily physical activity for 30 minutes or more, as able and the need to limit carbohydrate intake to 30 to 60 grams per meal to help with blood sugar control.   The need to take medication as prescribed, test blood sugar as directed, and to call between visits if there is a concern that blood sugar is uncontrolled is also discussed.   Tracey [name] is reminded of the importance of daily foot exam, annual eye examination, and good blood sugar, blood pressure and cholesterol control.  Diabetic Labs Latest Ref Rng & Units 03/01/2017 01/20/2017 07/15/2016 07/13/2016 03/16/2016  HbA1c <5.7 % of total Hgb - 9.3(H) - 7.0(H) 7.0(H)  Microalbumin mg/dL - 7.2 42.0(H) - -  Micro/Creat Ratio <30 mcg/mg creat - 61(H) 27.8 - -  Chol <200 mg/dL - 165 - 190 -  HDL >50 mg/dL - 42(L) - 40(L) -  Calc LDL <100 mg/dL - - - 127(H) -  Triglycerides <150 mg/dL - 98 - 115 -  Creatinine 0.50 - 1.05 mg/dL 0.72 0.84 - 0.75 0.70   BP/Weight 03/08/2017 01/27/2017 01/20/2017 11/16/2016 10/22/2016 7/65/4650 06/16/4654  Systolic BP 812 - 751 700 - 174 944  Diastolic BP 80 - 74 68 - 78 74  Wt. (Lbs) 382 381 383 382.08 382 - 391  BMI 54.81 54.67 54.95 54.82 54.81 - 56.1  Foot/eye exam completion dates Latest Ref Rng & Units 08/19/2016 07/15/2016  Eye Exam No Retinopathy No Retinopathy -  Foot Form Completion - - Done      Updated lab needed for next visit   Hypertension Controlled, no change in medication DASH diet and commitment to daily physical activity for a minimum of 30 minutes discussed and encouraged, as a part of hypertension management. The importance of attaining a healthy weight is also discussed.  BP/Weight 03/08/2017 01/27/2017 01/20/2017 11/16/2016 10/22/2016  7/86/7544 12/13/98  Systolic BP 712 - 197 588 - 325 498  Diastolic BP 80 - 74 68 - 78 74  Wt. (Lbs) 382 381 383 382.08 382 - 391  BMI 54.81 54.67 54.95 54.82 54.81 - 56.1       Morbid obesity Deteriorated. Patient re-educated about  the importance of commitment to a  minimum of 150 minutes of exercise per week.  The importance of healthy food choices with portion control discussed. Encouraged to start a food diary, count calories and to consider  joining a support group. Sample diet sheets offered. Goals set by the patient for the next several months.   Weight /BMI 03/08/2017 01/27/2017 01/20/2017  WEIGHT 382 lb 381 lb 383 lb  HEIGHT 5\' 10"  5\' 10"  5\' 10"   BMI 54.81 kg/m2 54.67 kg/m2 54.95 kg/m2      Hyperlipidemia LDL goal <100 Uncontrolled need sto reduce fat intake and increase activity Hyperlipidemia:Low fat diet discussed and encouraged.   Lipid Panel  Lab Results  Component Value Date   CHOL 165 01/20/2017   HDL 42 (L) 01/20/2017   LDLCALC 127 (H) 07/13/2016   TRIG 98 01/20/2017   CHOLHDL 3.9 01/20/2017

## 2017-03-08 NOTE — Patient Instructions (Addendum)
F/U as before in March, call if you need me sooner  No change in medication  Cut back on sweets and  Fried and fatty foods  Fasting lipid, cmp and EGFR and hBa1C Jan 15 or shortly after  It is important that you exercise regularly at least 30 minutes 5 times a week. If you develop chest pain, have severe difficulty breathing, or feel very tired, stop exercising immediately and seek medical attention    Please work on good  health habits so that your health will improve. 1. Commitment to daily physical activity for 30 to 60  minutes, if you are able to do this.  2. Commitment to wise food choices. Aim for half of your  food intake to be vegetable and fruit, one quarter starchy foods, and one quarter protein. Try to eat on a regular schedule  3 meals per day, snacking between meals should be limited to vegetables or fruits or small portions of nuts. 64 ounces of water per day is generally recommended, unless you have specific health conditions, like heart failure or kidney failure where you will need to limit fluid intake.  3. Commitment to sufficient and a  good quality of physical and mental rest daily, generally between 6 to 8 hours per day.  WITH PERSISTANCE AND PERSEVERANCE, THE IMPOSSIBLE , BECOMES THE NORM!

## 2017-03-10 ENCOUNTER — Other Ambulatory Visit: Payer: Self-pay | Admitting: Family Medicine

## 2017-03-12 ENCOUNTER — Encounter: Payer: Self-pay | Admitting: Family Medicine

## 2017-03-12 NOTE — Assessment & Plan Note (Signed)
Deteriorated. Patient re-educated about  the importance of commitment to a  minimum of 150 minutes of exercise per week.  The importance of healthy food choices with portion control discussed. Encouraged to start a food diary, count calories and to consider  joining a support group. Sample diet sheets offered. Goals set by the patient for the next several months.   Weight /BMI 03/08/2017 01/27/2017 01/20/2017  WEIGHT 382 lb 381 lb 383 lb  HEIGHT 5\' 10"  5\' 10"  5\' 10"   BMI 54.81 kg/m2 54.67 kg/m2 54.95 kg/m2

## 2017-03-12 NOTE — Assessment & Plan Note (Signed)
Uncontrolled ,however appear to be improving on current medication regime and pt is once again engaged in regular testing Tracey [name] is reminded of the importance of commitment to daily physical activity for 30 minutes or more, as able and the need to limit carbohydrate intake to 30 to 60 grams per meal to help with blood sugar control.   The need to take medication as prescribed, test blood sugar as directed, and to call between visits if there is a concern that blood sugar is uncontrolled is also discussed.   Tracey [name] is reminded of the importance of daily foot exam, annual eye examination, and good blood sugar, blood pressure and cholesterol control.  Diabetic Labs Latest Ref Rng & Units 03/01/2017 01/20/2017 07/15/2016 07/13/2016 03/16/2016  HbA1c <5.7 % of total Hgb - 9.3(H) - 7.0(H) 7.0(H)  Microalbumin mg/dL - 7.2 42.0(H) - -  Micro/Creat Ratio <30 mcg/mg creat - 61(H) 27.8 - -  Chol <200 mg/dL - 165 - 190 -  HDL >50 mg/dL - 42(L) - 40(L) -  Calc LDL <100 mg/dL - - - 127(H) -  Triglycerides <150 mg/dL - 98 - 115 -  Creatinine 0.50 - 1.05 mg/dL 0.72 0.84 - 0.75 0.70   BP/Weight 03/08/2017 01/27/2017 01/20/2017 11/16/2016 10/22/2016 1/91/4782 12/17/6211  Systolic BP 086 - 578 469 - 629 528  Diastolic BP 80 - 74 68 - 78 74  Wt. (Lbs) 382 381 383 382.08 382 - 391  BMI 54.81 54.67 54.95 54.82 54.81 - 56.1   Foot/eye exam completion dates Latest Ref Rng & Units 08/19/2016 07/15/2016  Eye Exam No Retinopathy No Retinopathy -  Foot Form Completion - - Done      Updated lab needed for next visit

## 2017-03-12 NOTE — Assessment & Plan Note (Signed)
Controlled, no change in medication DASH diet and commitment to daily physical activity for a minimum of 30 minutes discussed and encouraged, as a part of hypertension management. The importance of attaining a healthy weight is also discussed.  BP/Weight 03/08/2017 01/27/2017 01/20/2017 11/16/2016 10/22/2016 8/38/1840 06/18/5434  Systolic BP 067 - 703 403 - 524 818  Diastolic BP 80 - 74 68 - 78 74  Wt. (Lbs) 382 381 383 382.08 382 - 391  BMI 54.81 54.67 54.95 54.82 54.81 - 56.1

## 2017-03-12 NOTE — Assessment & Plan Note (Signed)
Uncontrolled need sto reduce fat intake and increase activity Hyperlipidemia:Low fat diet discussed and encouraged.   Lipid Panel  Lab Results  Component Value Date   CHOL 165 01/20/2017   HDL 42 (L) 01/20/2017   LDLCALC 127 (H) 07/13/2016   TRIG 98 01/20/2017   CHOLHDL 3.9 01/20/2017

## 2017-03-17 ENCOUNTER — Ambulatory Visit: Payer: Medicare Other

## 2017-03-22 ENCOUNTER — Ambulatory Visit: Payer: Medicare Other | Admitting: Nutrition

## 2017-03-29 ENCOUNTER — Ambulatory Visit: Payer: Medicare Other | Admitting: Orthotics

## 2017-03-29 ENCOUNTER — Encounter: Payer: Medicare Other | Attending: Family Medicine | Admitting: Nutrition

## 2017-03-29 ENCOUNTER — Other Ambulatory Visit: Payer: Self-pay | Admitting: Internal Medicine

## 2017-03-29 ENCOUNTER — Encounter: Payer: Self-pay | Admitting: Nutrition

## 2017-03-29 VITALS — Ht 70.0 in | Wt 378.0 lb

## 2017-03-29 DIAGNOSIS — E1165 Type 2 diabetes mellitus with hyperglycemia: Secondary | ICD-10-CM | POA: Insufficient documentation

## 2017-03-29 DIAGNOSIS — M2041 Other hammer toe(s) (acquired), right foot: Secondary | ICD-10-CM

## 2017-03-29 DIAGNOSIS — Z794 Long term (current) use of insulin: Secondary | ICD-10-CM | POA: Insufficient documentation

## 2017-03-29 DIAGNOSIS — E1142 Type 2 diabetes mellitus with diabetic polyneuropathy: Secondary | ICD-10-CM

## 2017-03-29 DIAGNOSIS — M2042 Other hammer toe(s) (acquired), left foot: Secondary | ICD-10-CM

## 2017-03-29 DIAGNOSIS — IMO0002 Reserved for concepts with insufficient information to code with codable children: Secondary | ICD-10-CM

## 2017-03-29 DIAGNOSIS — E118 Type 2 diabetes mellitus with unspecified complications: Secondary | ICD-10-CM

## 2017-03-29 NOTE — Patient Instructions (Addendum)
Goals 1. Increase walking as tolerated 2. Try eggs and oatmeal for breakfast 3. Continue to increase low carb vegetables Get A1C down to 7%.  Eat 1-2 yogurt a day to help with yeast infections. Talk to Dr. Janace Hoard Metformin ER due to diarrhea with 1000 mg BID. Continue to increase water intake. Drink a gallon of water. Talk to Dr. Moshe Cipro about stopping Invokana due to yeast infections.

## 2017-03-29 NOTE — Progress Notes (Signed)
Patient was recast in foam due to previous box getting crushed.

## 2017-03-29 NOTE — Progress Notes (Signed)
  Diabetes Self-Management Education  Visit Type:   Follow up DM  Appt. Start Time: 1100 Appt. End Time: 1130 03/29/2017  [name], identified by name and date of birth, is a 61 y.o. female with a diagnosis of Diabetes Type 2. Lost 4 lbs. Still having yeast infection and requested more medication. Waiting to get money to pick up the prescription. Complains of diarrhea from Metformin- 1000 mg BID.Marland Kitchen Has loose 4 bowels per day. Is on Invokana and Januvia. Has been eating a lot more of vegetables, turnip greens she says. Drinking some water but not enough. Needs a gallon per day, especially being on Invokana.   B) Steak biscuit, unsweet tea L) Raveloli and half sandwich, water D) skipped.or  Tuna sub 6" and water Walking a little in the house.  But not much due to knee issues.   Lab Results  Component Value Date   HGBA1C 9.3 (H) 01/20/2017       CMP Latest Ref Rng & Units 03/01/2017 01/20/2017 07/13/2016  Glucose 65 - 99 mg/dL 151(H) 242(H) 151(H)  BUN 7 - 25 mg/dL 9 6(L) 11  Creatinine 0.50 - 1.05 mg/dL 0.72 0.84 0.75  Sodium 135 - 146 mmol/L 141 134(L) 138  Potassium 3.5 - 5.3 mmol/L 4.4 4.8 4.1  Chloride 98 - 110 mmol/L 105 97(L) 103  CO2 20 - 32 mmol/L 26 27 21   Calcium 8.6 - 10.4 mg/dL 8.8 9.3 8.8  Total Protein 6.1 - 8.1 g/dL 6.9 7.2 7.5  Total Bilirubin 0.2 - 1.2 mg/dL 0.5 0.7 0.5  Alkaline Phos 33 - 130 U/L - - 61  AST 10 - 35 U/L 30 55(H) 21  ALT 6 - 29 U/L 29 64(H) 27     ASSESSMENT  Wt Readings from Last 3 Encounters:  03/29/17 (!) 378 lb (171.5 kg)  03/08/17 (!) 382 lb (173.3 kg)  01/27/17 (!) 381 lb (172.8 kg)   Ht Readings from Last 3 Encounters:  03/29/17 5\' 10"  (1.778 m)  03/08/17 5\' 10"  (1.778 m)  01/27/17 5\' 10"  (1.778 m)   Body mass index is 54.24 kg/m.   Individualized Plan for Diabetes Self-Management Training:   Learning Objective:  Patient will have a greater understanding of diabetes self-management. Patient education plan is to  attend individual and/or group sessions per assessed needs and concerns.   Plan:    Goals 1. Increase walking as tolerated 2. Try eggs and oatmeal for breakfast 3. Continue to increase low carb vegetables Get A1C down to 7%.  Eat 1-2 yogurt a day to help with yeast infections. Talk to Dr. Janace [name] Metformin ER due to diarrhea with 1000 mg BID. Continue to increase water intake. Drink a gallon of water. Talk to Dr. Moshe [name] about stopping Invokana due to yeast infections.  Expected Outcomes:   Weight loss and A1C less than 7%   Education material provided: My Plate  If problems or questions, patient to contact team via:  Phone and Email  Future DSME appointment:   3 months.     Due to recent medical publication reporting Necrotizing fasciitis in this class of drugs of Invokana, and her risk factors of morbid obesity and possible vascular issues and chronic yeast infections would recommend to stop her Invokana.       She may benefit from Metformin ER due to diarrhea  With 1000 mg BID.Marland Kitchen

## 2017-04-01 ENCOUNTER — Other Ambulatory Visit: Payer: Self-pay | Admitting: Family Medicine

## 2017-04-01 ENCOUNTER — Telehealth: Payer: Self-pay | Admitting: Family Medicine

## 2017-04-01 NOTE — Telephone Encounter (Signed)
Called and advised pt to d/c invokana diue to increased risk of compliocatons, states fBg today was 140. She is to call back if fasting sugars are 140 and above as I will likely inc g her glipizide dose to 10 mg, states she understands

## 2017-04-15 ENCOUNTER — Ambulatory Visit (INDEPENDENT_AMBULATORY_CARE_PROVIDER_SITE_OTHER): Payer: Medicare Other | Admitting: Orthotics

## 2017-04-15 DIAGNOSIS — M2041 Other hammer toe(s) (acquired), right foot: Secondary | ICD-10-CM

## 2017-04-15 DIAGNOSIS — E1142 Type 2 diabetes mellitus with diabetic polyneuropathy: Secondary | ICD-10-CM | POA: Diagnosis not present

## 2017-04-15 DIAGNOSIS — M2042 Other hammer toe(s) (acquired), left foot: Secondary | ICD-10-CM

## 2017-04-15 NOTE — Progress Notes (Signed)

## 2017-04-19 ENCOUNTER — Other Ambulatory Visit: Payer: Self-pay | Admitting: Family Medicine

## 2017-05-04 ENCOUNTER — Telehealth: Payer: Self-pay

## 2017-05-04 DIAGNOSIS — R748 Abnormal levels of other serum enzymes: Secondary | ICD-10-CM | POA: Diagnosis not present

## 2017-05-04 DIAGNOSIS — E669 Obesity, unspecified: Secondary | ICD-10-CM | POA: Diagnosis not present

## 2017-05-04 DIAGNOSIS — E785 Hyperlipidemia, unspecified: Secondary | ICD-10-CM

## 2017-05-04 DIAGNOSIS — E1169 Type 2 diabetes mellitus with other specified complication: Secondary | ICD-10-CM | POA: Diagnosis not present

## 2017-05-04 NOTE — Telephone Encounter (Signed)
I spoke with pt statesher blood sugar has "never been that high" and she feels great! Will f/u on hBA1C, she has not checked it again since this morning States sugars have been running between 170 to 250,  I adsked her to check it now, but she was not at home

## 2017-05-04 NOTE — Telephone Encounter (Signed)
Carlos from [place] gave Verbal Glucose 465. Repeated and verified 05/04/2017

## 2017-05-06 ENCOUNTER — Other Ambulatory Visit: Payer: Self-pay | Admitting: Family Medicine

## 2017-05-06 LAB — LIPID PANEL
CHOL/HDL RATIO: 4.2 (calc) (ref <5.0)
Cholesterol: 138 mg/dL (ref <200)
HDL: 33 mg/dL — ABNORMAL LOW (ref >50)
LDL Cholesterol (Calc): 82 mg/dL (calc)
NON-HDL CHOLESTEROL (CALC): 105 mg/dL (ref <130)
Triglycerides: 132 mg/dL (ref <150)

## 2017-05-06 LAB — COMPLETE METABOLIC PANEL WITH GFR
AG Ratio: 0.9 (calc) — ABNORMAL LOW (ref 1.0–2.5)
ALKALINE PHOSPHATASE (APISO): 133 U/L — AB (ref 33–130)
ALT: 320 U/L — AB (ref 6–29)
AST: 222 U/L — AB (ref 10–35)
Albumin: 3.7 g/dL (ref 3.6–5.1)
BUN: 8 mg/dL (ref 7–25)
CO2: 25 mmol/L (ref 20–32)
CREATININE: 0.72 mg/dL (ref 0.50–1.05)
Calcium: 8.9 mg/dL (ref 8.6–10.4)
Chloride: 98 mmol/L (ref 98–110)
GFR, Est African American: 106 mL/min/{1.73_m2} (ref >=60)
GFR, Est Non African American: 92 mL/min/{1.73_m2} (ref >=60)
GLUCOSE: 465 mg/dL — AB (ref 65–99)
Globulin: 4.1 g/dL (calc) — ABNORMAL HIGH (ref 1.9–3.7)
Potassium: 4.4 mmol/L (ref 3.5–5.3)
SODIUM: 131 mmol/L — AB (ref 135–146)
Total Bilirubin: 0.6 mg/dL (ref 0.2–1.2)
Total Protein: 7.8 g/dL (ref 6.1–8.1)

## 2017-05-06 LAB — HEP PANEL, GENERAL
HEP B S AB: NONREACTIVE
HEP C AB: NONREACTIVE
Hep B Core Total Ab: NONREACTIVE
Hepatitis A AB,Total: REACTIVE — AB
Hepatitis B Surface Ag: NONREACTIVE
SIGNAL TO CUT-OFF: 0.09 (ref <1.00)

## 2017-05-06 LAB — HEMOGLOBIN A1C
HEMOGLOBIN A1C: 8.2 %{Hb} — AB (ref <5.7)
Mean Plasma Glucose: 189 (calc)
eAG (mmol/L): 10.4 (calc)

## 2017-05-06 MED ORDER — GLIPIZIDE ER 10 MG PO TB24: 10.0000 mg | ORAL_TABLET | Freq: Every day | ORAL | 3 refills | Status: DC

## 2017-05-06 NOTE — Progress Notes (Signed)
Glipizide dose increased

## 2017-05-06 NOTE — Telephone Encounter (Signed)
Labs ordered.

## 2017-05-13 ENCOUNTER — Ambulatory Visit: Payer: Medicare Other | Admitting: Orthotics

## 2017-05-17 ENCOUNTER — Ambulatory Visit: Payer: Medicare Other | Admitting: Podiatry

## 2017-05-17 DIAGNOSIS — E785 Hyperlipidemia, unspecified: Secondary | ICD-10-CM | POA: Diagnosis not present

## 2017-05-17 LAB — HEPATIC FUNCTION PANEL
AG Ratio: 1 (calc) (ref 1.0–2.5)
ALKALINE PHOSPHATASE (APISO): 99 U/L (ref 33–130)
ALT: 109 U/L — ABNORMAL HIGH (ref 6–29)
AST: 72 U/L — ABNORMAL HIGH (ref 10–35)
Albumin: 4 g/dL (ref 3.6–5.1)
BILIRUBIN DIRECT: 0.2 mg/dL (ref 0.0–0.2)
BILIRUBIN TOTAL: 0.7 mg/dL (ref 0.2–1.2)
Globulin: 4.2 g/dL (calc) — ABNORMAL HIGH (ref 1.9–3.7)
Indirect Bilirubin: 0.5 mg/dL (calc) (ref 0.2–1.2)
Total Protein: 8.2 g/dL — ABNORMAL HIGH (ref 6.1–8.1)

## 2017-05-20 ENCOUNTER — Ambulatory Visit: Payer: Medicare Other | Admitting: Orthotics

## 2017-05-20 ENCOUNTER — Telehealth: Payer: Self-pay

## 2017-05-20 DIAGNOSIS — E1169 Type 2 diabetes mellitus with other specified complication: Secondary | ICD-10-CM

## 2017-05-20 DIAGNOSIS — E669 Obesity, unspecified: Principal | ICD-10-CM

## 2017-05-20 DIAGNOSIS — M2041 Other hammer toe(s) (acquired), right foot: Secondary | ICD-10-CM

## 2017-05-20 DIAGNOSIS — M2042 Other hammer toe(s) (acquired), left foot: Secondary | ICD-10-CM

## 2017-05-20 NOTE — Telephone Encounter (Signed)
-----  Message from Fayrene Helper, MD sent at 05/18/2017  5:39 AM EST ----- Pls let her know , liver function MUCH better. Needs HBA1C, non fast cmp and EGFR 4/22 or shortly after. Cancel March appt. Sched appt for last week in April instead please, questions, please ask. I will send appt scheduling info to [facility]

## 2017-06-06 NOTE — Progress Notes (Signed)

## 2017-06-09 ENCOUNTER — Other Ambulatory Visit: Payer: Self-pay | Admitting: Family Medicine

## 2017-06-09 ENCOUNTER — Other Ambulatory Visit: Payer: Self-pay

## 2017-06-09 MED ORDER — GLIPIZIDE ER 10 MG PO TB24: ORAL_TABLET | ORAL | 1 refills | Status: DC

## 2017-06-23 ENCOUNTER — Ambulatory Visit: Payer: Medicare Other | Admitting: Family Medicine

## 2017-06-28 ENCOUNTER — Other Ambulatory Visit: Payer: Self-pay | Admitting: Family Medicine

## 2017-07-01 ENCOUNTER — Ambulatory Visit: Payer: Medicare Other | Admitting: Nutrition

## 2017-07-13 ENCOUNTER — Encounter: Payer: Self-pay | Admitting: Nutrition

## 2017-07-13 ENCOUNTER — Encounter: Payer: Medicare Other | Attending: Family Medicine | Admitting: Nutrition

## 2017-07-13 DIAGNOSIS — E669 Obesity, unspecified: Secondary | ICD-10-CM | POA: Insufficient documentation

## 2017-07-13 DIAGNOSIS — Z713 Dietary counseling and surveillance: Secondary | ICD-10-CM | POA: Insufficient documentation

## 2017-07-13 DIAGNOSIS — E119 Type 2 diabetes mellitus without complications: Secondary | ICD-10-CM | POA: Diagnosis not present

## 2017-07-13 DIAGNOSIS — Z6841 Body Mass Index (BMI) 40.0 and over, adult: Secondary | ICD-10-CM | POA: Diagnosis not present

## 2017-07-13 NOTE — Patient Instructions (Addendum)
Goals 1. Walk 15 minutes a day. 2. Cut out using salt and use Mrs. Dash or onion power or garlic powder. 3. Lose 5 lbs in 1 month  Get A1C down to 7.5%.

## 2017-07-13 NOTE — Progress Notes (Signed)
Diabetes Self-Management Education  Visit Type:  Follow-up  Appt. Start Time: 1330 Appt. End Time: 1400  07/13/2017  Tracey [name], identified by name and date of birth, is a 62 y.o. female with a diagnosis of Diabetes:  .  AqC diwb ti 8.2% from 9.3%. Gained 5 lbs. Unable to exercise or stand long due to morbid obesity.  ASSESSMENT  Height 5\' 10"  (1.778 m), weight (!) 383 lb (173.7 kg). Body mass index is 54.95 kg/m.   Diabetes Self-Management Education - 07/13/17 1340      Health Coping   How would you rate your overall health?  Fair      Psychosocial Assessment   Patient Concerns  Nutrition/Meal planning;Weight Control      Pre-Education Assessment   Patient understands the diabetes disease and treatment process.  Needs Review    Patient understands incorporating nutritional management into lifestyle.  Needs Review    Patient undertands incorporating physical activity into lifestyle.  Needs Review    Patient understands using medications safely.  Needs Review    Patient understands monitoring blood glucose, interpreting and using results  Needs Review    Patient understands prevention, detection, and treatment of acute complications.  Needs Review    Patient understands prevention, detection, and treatment of chronic complications.  Needs Review    Patient understands how to develop strategies to address psychosocial issues.  Needs Review    Patient understands how to develop strategies to promote health/change behavior.  Needs Review      Complications   Last HgB A1C per patient/outside source  8.2 %    How often do you check your blood sugar?  1-2 times/day    Fasting Blood glucose range (mg/dL)  180-200    Postprandial Blood glucose range (mg/dL)  180-200      Dietary Intake   Breakfast  Banana    Lunch  salad with [place] lunch meat ranch dressing, water    Dinner  same as lunch, Clinical biochemist)  water      Exercise   Exercise Type  ADL's      Patient Education   Nutrition management   Role of diet in the treatment of diabetes and the relationship between the three main macronutrients and blood glucose level;Carbohydrate counting;Reviewed blood glucose goals for pre and post meals and how to evaluate the patients' food intake on their blood glucose level.    Physical activity and exercise   Helped patient identify appropriate exercises in relation to his/her diabetes, diabetes complications and other health issue.    Medications  Reviewed patients medication for diabetes, action, purpose, timing of dose and side effects.    Chronic complications  Relationship between chronic complications and blood glucose control    Psychosocial adjustment  Worked with patient to identify barriers to care and solutions    Personal strategies to promote health  Lifestyle issues that need to be addressed for better diabetes care      Individualized Goals (developed by patient)   Physical Activity  Exercise 3-5 times per week;15 minutes per day    Monitoring   test my blood glucose as discussed    Problem Solving  Lose weight.    Reducing Risk  examine blood glucose patterns;do foot checks daily;get labs drawn      Patient Self-Evaluation of Goals - Patient rates self as meeting previously set goals (% of time)   Nutrition  25 - 50%    Physical Activity  <  25%    Medications  50 - 75 %    Monitoring  50 - 75 %    Problem Solving  25 - 50%    Reducing Risk  25 - 50%    Health Coping  25 - 50%      Post-Education Assessment   Patient understands the diabetes disease and treatment process.  Demonstrates understanding / competency    Patient understands incorporating nutritional management into lifestyle.  Needs Review    Patient undertands incorporating physical activity into lifestyle.  Needs Review      Outcomes   Program Status  Completed      Subsequent Visit   Since your last visit have you continued or begun to take your medications as  prescribed?  Yes    Since your last visit have you had your blood pressure checked?  Yes    Since your last visit have you experienced any weight changes?  Gain    Weight Gain (lbs)  5    Since your last visit, are you checking your blood glucose at least once a day?  No       Learning Objective:  Patient will have a greater understanding of diabetes self-management. Patient education plan is to attend individual and/or group sessions per assessed needs and concerns.   Plan:   Patient Instructions  Goals 1. Walk 15 minutes a day. 2. Cut out using salt and use Mrs. Dash or onion power or garlic powder. 3. Lose 5 lbs in 1 month  Get A1C down to 7.5%.    Expected Outcomes:  Demonstrated interest in learning. Expect positive outcomes  Education material provided: My Plate and Carbohydrate counting sheet  If problems or questions, patient to contact team via:  Phone and Email  Future DSME appointment: - 4-6 wks  She would benefit from a GLP 1 to help improve blood sugars and encourage weight loss. Victoza believe is covered under Medicaid.

## 2017-07-15 ENCOUNTER — Telehealth: Payer: Self-pay

## 2017-07-15 DIAGNOSIS — E669 Obesity, unspecified: Principal | ICD-10-CM

## 2017-07-15 DIAGNOSIS — E1169 Type 2 diabetes mellitus with other specified complication: Secondary | ICD-10-CM

## 2017-07-15 NOTE — Telephone Encounter (Signed)
-----   Message from Fayrene Helper, MD sent at 07/14/2017 12:52 PM EDT ----- Regarding: FW: new referral pls refer to Jearld Fenton for diabeic ed again, thanks! ----- Message ----- From: Jefm Miles Sent: 07/13/2017   3:05 PM To: Fayrene Helper, MD Subject: FW: new referral                               Can you please put in an updated referral please per Dr.Crumpton ----- Message ----- From: Arlana Lindau, RD Sent: 07/13/2017   2:06 PM To: Artis Flock Hopson Subject: new referral                                   Hi Dusty, Can you put in an updated referrral for [facility] for me?  THanks Jearld Fenton, RDN, CDE

## 2017-08-04 DIAGNOSIS — E669 Obesity, unspecified: Secondary | ICD-10-CM | POA: Diagnosis not present

## 2017-08-04 DIAGNOSIS — E1169 Type 2 diabetes mellitus with other specified complication: Secondary | ICD-10-CM | POA: Diagnosis not present

## 2017-08-04 LAB — COMPLETE METABOLIC PANEL WITH GFR
AG Ratio: 1.1 (calc) (ref 1.0–2.5)
ALBUMIN MSPROF: 3.9 g/dL (ref 3.6–5.1)
ALT: 27 U/L (ref 6–29)
AST: 24 U/L (ref 10–35)
Alkaline phosphatase (APISO): 70 U/L (ref 33–130)
BUN: 12 mg/dL (ref 7–25)
CALCIUM: 9 mg/dL (ref 8.6–10.4)
CO2: 27 mmol/L (ref 20–32)
CREATININE: 0.88 mg/dL (ref 0.50–0.99)
Chloride: 99 mmol/L (ref 98–110)
GFR, EST AFRICAN AMERICAN: 83 mL/min/{1.73_m2} (ref >=60)
GFR, Est Non African American: 71 mL/min/{1.73_m2} (ref >=60)
Globulin: 3.6 g/dL (calc) (ref 1.9–3.7)
Glucose, Bld: 274 mg/dL — ABNORMAL HIGH (ref 65–99)
Potassium: 4.5 mmol/L (ref 3.5–5.3)
Sodium: 133 mmol/L — ABNORMAL LOW (ref 135–146)
TOTAL PROTEIN: 7.5 g/dL (ref 6.1–8.1)
Total Bilirubin: 0.4 mg/dL (ref 0.2–1.2)

## 2017-08-05 ENCOUNTER — Other Ambulatory Visit: Payer: Self-pay | Admitting: Family Medicine

## 2017-08-05 DIAGNOSIS — E669 Obesity, unspecified: Principal | ICD-10-CM

## 2017-08-05 DIAGNOSIS — E1169 Type 2 diabetes mellitus with other specified complication: Secondary | ICD-10-CM

## 2017-08-05 LAB — HEMOGLOBIN A1C
Hgb A1c MFr Bld: 9.5 % of total Hgb — ABNORMAL HIGH (ref <5.7)
MEAN PLASMA GLUCOSE: 226 (calc)
eAG (mmol/L): 12.5 (calc)

## 2017-08-09 ENCOUNTER — Ambulatory Visit: Payer: Medicare Other | Admitting: Family Medicine

## 2017-08-10 ENCOUNTER — Other Ambulatory Visit: Payer: Self-pay | Admitting: Family Medicine

## 2017-08-24 ENCOUNTER — Encounter: Payer: Self-pay | Admitting: Nutrition

## 2017-08-24 ENCOUNTER — Encounter: Payer: Medicare Other | Attending: Family Medicine | Admitting: Nutrition

## 2017-08-24 VITALS — Wt 391.0 lb

## 2017-08-24 DIAGNOSIS — Z713 Dietary counseling and surveillance: Secondary | ICD-10-CM | POA: Insufficient documentation

## 2017-08-24 DIAGNOSIS — E669 Obesity, unspecified: Secondary | ICD-10-CM | POA: Diagnosis not present

## 2017-08-24 DIAGNOSIS — E119 Type 2 diabetes mellitus without complications: Secondary | ICD-10-CM | POA: Diagnosis not present

## 2017-08-24 DIAGNOSIS — Z6841 Body Mass Index (BMI) 40.0 and over, adult: Secondary | ICD-10-CM | POA: Insufficient documentation

## 2017-08-24 DIAGNOSIS — E1165 Type 2 diabetes mellitus with hyperglycemia: Secondary | ICD-10-CM

## 2017-08-24 DIAGNOSIS — IMO0002 Reserved for concepts with insufficient information to code with codable children: Secondary | ICD-10-CM

## 2017-08-24 DIAGNOSIS — E118 Type 2 diabetes mellitus with unspecified complications: Secondary | ICD-10-CM

## 2017-08-24 NOTE — Progress Notes (Signed)
  Diabetes Self-Management Education  Visit Type:     Appt. Start Time: 1030 Appt. End Time: 1100 08/24/2017  Tracey [name], identified by name and date of birth, is a 62 y.o. female with a diagnosis of Diabetes Type 2:  .  A1C up to 9.5 from 8.2% 3 months ago. Hasn't made any significant changes with diet or exercise.  Gained 8 lbs in the last three months. Says she doesn't know why it's going up. Not taking Invokana anymore due to risk of complications. No longer taking cholesterol medications due to problems with her liver.    She notes she forgets her medications once or twice a week. Metformin 1000 mg BID and Glipizide 5 mg a day. Downloaded meter; 7 day avg 141, 14 day 138 mg/dl but only testing in am.   Stays up late til 2-5 am and sleeps most of the day til usually 10-12 noon. Says she has always slept late and stayed up at night. Had a sleep study but wasn't ever told of her results.  Scheduled to see Dr. Dorris Fetch on May 23rd. PCP Dr. Moshe Cipro Appt 5/20.    Current diet exceeds her needs as evidenced by A1C of 9.5%, 8 lbs weight gain. Current diet is insuffient in fresh fruits, vegeables and whole grains. Eats more processed foods due to limited income.    Not physical active due to morbid obesity.    She is at high risk for CVD and complications from DM.  Discussed the possible need for long acting insulin for better blood sugar control and reduced complications. She verbalized understanding. Will see Dr. Dorris Fetch, Endocrinology  next week.     She refused to consider bariatric surgery as she notes she had surgery in past and had blood clot and fears another blood clot.  ASSESSMENT  Weight (!) 391 lb (177.4 kg). Body mass index is 56.1 kg/m.    10 am B) Banana and water  4 pm: Banana sandwich, water   8 pm; Baked chicken, mac/cheese, 1 slice bread and water Snacks during day; 1-2 cookies    Learning Objective:  Patient will have a greater understanding of diabetes  self-management. Patient education plan is to attend individual and/or group sessions per assessed needs and concerns.   Plan:  Goals 1. Cut out mac/cheese, cookies and bread. 2. Increase vegetables with lunch and dinner 3. Drink only water 4. Cut out snacks. 5. Lose 5 lbs per month. Increase walking or chair exercises for 10-15 minutes per day. .    Expected Outcomes:    Improved self management of her DM.  Education material provided: My Plate and Carbohydrate counting sheet  If problems or questions, patient to contact team via:  Phone and Email  Future DSME appointment: -    Recommend to consider stopping Glipiizide as risks out weigh benefits, starting a long acting insulin and possible GLP for needed weight loss and better blood sugar control.

## 2017-08-24 NOTE — Patient Instructions (Addendum)
Goals 1. Cut out mac/cheese, cookies and bread. 2. Increase vegetables with lunch and dinner 3. Drink only water 4. Cut out snacks. 5. Lose 5 lbs per month. Increase walking or chair exercises for 10-15 minutes per day. Marland Kitchen

## 2017-08-30 ENCOUNTER — Encounter: Payer: Self-pay | Admitting: Family Medicine

## 2017-08-30 ENCOUNTER — Ambulatory Visit (INDEPENDENT_AMBULATORY_CARE_PROVIDER_SITE_OTHER): Payer: Medicare Other | Admitting: Family Medicine

## 2017-08-30 ENCOUNTER — Other Ambulatory Visit: Payer: Self-pay

## 2017-08-30 VITALS — BP 118/70 | HR 94 | Resp 16 | Ht 70.0 in | Wt 384.1 lb

## 2017-08-30 DIAGNOSIS — E785 Hyperlipidemia, unspecified: Secondary | ICD-10-CM

## 2017-08-30 DIAGNOSIS — L608 Other nail disorders: Secondary | ICD-10-CM

## 2017-08-30 DIAGNOSIS — E1140 Type 2 diabetes mellitus with diabetic neuropathy, unspecified: Secondary | ICD-10-CM | POA: Diagnosis not present

## 2017-08-30 DIAGNOSIS — E1165 Type 2 diabetes mellitus with hyperglycemia: Secondary | ICD-10-CM | POA: Diagnosis not present

## 2017-08-30 DIAGNOSIS — I10 Essential (primary) hypertension: Secondary | ICD-10-CM

## 2017-08-30 DIAGNOSIS — Z1231 Encounter for screening mammogram for malignant neoplasm of breast: Secondary | ICD-10-CM

## 2017-08-30 NOTE — Patient Instructions (Addendum)
Wellness with nurse August 7 or shortly after  Fasting  Lipid panel today, I will call re medication for cholesterol once I get this, please go to lab first then  Return to office for appointment today  MD follow up in 5 months   Please schedule your  Mammogram at checkout it is past due  Foot exam today shows  You have nerve damage , please protect your feet and examine daily for cuts  Youn are referred to Podiatry for nail and foot care, that office will call wth your appointment

## 2017-08-31 ENCOUNTER — Other Ambulatory Visit: Payer: Self-pay | Admitting: Family Medicine

## 2017-08-31 ENCOUNTER — Telehealth: Payer: Self-pay | Admitting: Family Medicine

## 2017-08-31 LAB — LIPID PANEL
CHOL/HDL RATIO: 5 (calc) — AB (ref <5.0)
CHOLESTEROL: 240 mg/dL — AB (ref <200)
HDL: 48 mg/dL — ABNORMAL LOW (ref >50)
LDL CHOLESTEROL (CALC): 166 mg/dL — AB
Non-HDL Cholesterol (Calc): 192 mg/dL (calc) — ABNORMAL HIGH (ref <130)
TRIGLYCERIDES: 124 mg/dL (ref <150)

## 2017-08-31 LAB — SPECIMEN COMPROMISED

## 2017-08-31 MED ORDER — ROSUVASTATIN CALCIUM 10 MG PO TABS: 10.0000 mg | ORAL_TABLET | Freq: Every day | ORAL | 3 refills | Status: DC

## 2017-08-31 MED ORDER — PITAVASTATIN CALCIUM 2 MG PO TABS: 1.0000 | ORAL_TABLET | Freq: Every day | ORAL | 5 refills | Status: DC

## 2017-08-31 NOTE — Telephone Encounter (Signed)
Medication changed to crestor and pharmacist is aware

## 2017-08-31 NOTE — Telephone Encounter (Signed)
Jonni Sanger with the Pharmacy [phone]) is calling to advise that the medication Zypitamag 2 mg is not covered by the insurance.

## 2017-08-31 NOTE — Telephone Encounter (Signed)
Livalo is not covered. Pharmacy requesting it be changed.

## 2017-08-31 NOTE — Progress Notes (Signed)
crs

## 2017-09-02 ENCOUNTER — Ambulatory Visit (HOSPITAL_COMMUNITY)
Admission: RE | Admit: 2017-09-02 | Discharge: 2017-09-02 | Disposition: A | Discharge status: Home / Self Care | Payer: Medicare Other | Source: Ambulatory Visit | Attending: Family Medicine | Admitting: Family Medicine

## 2017-09-02 ENCOUNTER — Encounter: Payer: Self-pay | Admitting: "Endocrinology

## 2017-09-02 ENCOUNTER — Encounter (HOSPITAL_COMMUNITY): Payer: Self-pay

## 2017-09-02 ENCOUNTER — Ambulatory Visit (INDEPENDENT_AMBULATORY_CARE_PROVIDER_SITE_OTHER): Payer: Medicare Other | Admitting: "Endocrinology

## 2017-09-02 ENCOUNTER — Ambulatory Visit: Payer: Medicare Other | Admitting: Family Medicine

## 2017-09-02 VITALS — BP 132/87 | HR 90 | Ht 70.0 in | Wt 383.0 lb

## 2017-09-02 DIAGNOSIS — I10 Essential (primary) hypertension: Secondary | ICD-10-CM

## 2017-09-02 DIAGNOSIS — E1165 Type 2 diabetes mellitus with hyperglycemia: Secondary | ICD-10-CM | POA: Insufficient documentation

## 2017-09-02 DIAGNOSIS — Z1231 Encounter for screening mammogram for malignant neoplasm of breast: Secondary | ICD-10-CM | POA: Insufficient documentation

## 2017-09-02 DIAGNOSIS — E782 Mixed hyperlipidemia: Secondary | ICD-10-CM | POA: Diagnosis not present

## 2017-09-02 NOTE — Progress Notes (Signed)
Endocrinology Consult Note       09/02/2017, 5:40 PM   Subjective:    Patient ID: Tracey [name], female    DOB: 03-Feb-1956.  Tracey [name] is being seen in consultation for management of currently uncontrolled symptomatic diabetes requested by  Fayrene Helper, MD.   Past Medical History:  Diagnosis Date  . Anginal pain ([place])   . Arthritis   . Diabetes mellitus without complication ([place])   . Diastolic dysfunction 11/6759   Grade 1  . GERD (gastroesophageal reflux disease)   . Hyperlipidemia   . Hyperlipidemia   . Hypertension 04/15/2012  . Morbid obesity ([place])   . PE (pulmonary embolism)   . Pulmonary emboli ([place]) 1999   post TAH  . Pulmonary embolism ([place])   . Sleep apnea    Past Surgical History:  Procedure Laterality Date  . ABDOMINAL HYSTERECTOMY  1999   , totalfor fibroids  . CESAREAN SECTION    . CHOLECYSTECTOMY  1996  . COLONOSCOPY N/A 10/31/2015   Procedure: COLONOSCOPY;  Surgeon: Rogene Houston, MD;  Location: [facility];  Service: Endoscopy;  Laterality: N/A;  930   Social History   Socioeconomic History  . Marital status: Single    Spouse name: Not on file  . Number of children: Not on file  . Years of education: Not on file  . Highest education level: Not on file  Occupational History  . Not on file  Social Needs  . Financial resource strain: Not on file  . Food insecurity:    Worry: Not on file    Inability: Not on file  . Transportation needs:    Medical: Not on file    Non-medical: Not on file  Tobacco Use  . Smoking status: Never Smoker  . Smokeless tobacco: Never Used  Substance and Sexual Activity  . Alcohol use: No    Alcohol/week: 0.0 oz  . Drug use: No  . Sexual activity: Not Currently  Lifestyle  . Physical activity:    Days per week: Not on file    Minutes per session: Not on file  . Stress: Not on file  Relationships  . Social  connections:    Talks on phone: Not on file    Gets together: Not on file    Attends religious service: Not on file    Active member of club or organization: Not on file    Attends meetings of clubs or organizations: Not on file    Relationship status: Not on file  Other Topics Concern  . Not on file  Social History Narrative  . Not on file   Outpatient Encounter Medications as of 09/02/2017  Medication Sig  . ACCU-CHEK SOFTCLIX LANCETS lancets Use as instructed twice daily dx E11.65  . amLODipine (NORVASC) 2.5 MG tablet TAKE ONE (1) TABLET BY MOUTH EVERY DAY  . aspirin EC 81 MG tablet Take 1 tablet (81 mg total) by mouth daily.  Marland Kitchen glucose blood (ACCU-CHEK AVIVA) test strip Use as instructed twice daily dx E11.65  . ibuprofen (ADVIL,MOTRIN) 200 MG tablet Take 200 mg by mouth every 8 (eight)  hours as needed for moderate pain.   Marland Kitchen JANUVIA 100 MG tablet TAKE ONE (1) TABLET EACH DAY. DISCONTINUE JANUMET  . metFORMIN (GLUCOPHAGE) 1000 MG tablet Take 1 tablet (1,000 mg total) by mouth 2 (two) times daily with a meal.  . metoprolol tartrate (LOPRESSOR) 25 MG tablet TAKE ONE TABLET TWICE DAILY  . omeprazole (PRILOSEC) 20 MG capsule TAKE ONE CAPSULE BY MOUTH DAILY  . rosuvastatin (CRESTOR) 10 MG tablet Take 1 tablet (10 mg total) by mouth daily.  Marland Kitchen spironolactone (ALDACTONE) 25 MG tablet TAKE ONE (1) TABLET BY MOUTH EVERY DAY  . [DISCONTINUED] glipiZIDE (GLUCOTROL XL) 10 MG 24 hr tablet TAKE ONE TABLET (10MG  TOTAL) BY MOUTH DAILY WITH BREAKFAST  . [DISCONTINUED] metFORMIN (GLUCOPHAGE) 1000 MG tablet Take 1 tablet (1,000 mg total) by mouth 2 (two) times daily with a meal.   No facility-administered encounter medications on file as of 09/02/2017.     ALLERGIES: Allergies  Allergen Reactions  . Haloperidol Lactate Anaphylaxis  . Lipitor [Atorvastatin Calcium] Other (See Comments)    Markedly elevated liver enzymes    VACCINATION STATUS: Immunization History  Administered Date(s)  Administered  . Influenza,inj,Quad PF,6+ Mos 04/01/2015, 03/16/2016, 01/20/2017  . Pneumococcal Polysaccharide-23 05/13/2015    Diabetes  She presents for her initial diabetic visit. She has type 2 diabetes mellitus. Onset time: He was diagnosed at approximate age of 62 years. Her disease course has been worsening. There are no hypoglycemic associated symptoms. Pertinent negatives for hypoglycemia include no confusion, headaches, pallor or seizures. Associated symptoms include blurred vision, fatigue, polydipsia and polyuria. Pertinent negatives for diabetes include no chest pain and no polyphagia. There are no hypoglycemic complications. Symptoms are worsening. There are no diabetic complications. Risk factors for coronary artery disease include diabetes mellitus, dyslipidemia, family history, hypertension, obesity, post-menopausal and sedentary lifestyle. Current diabetic treatment includes oral agent (triple therapy). Her weight is decreasing steadily. She is following a generally unhealthy diet. When asked about meal planning, she reported none. She has had a previous visit with a dietitian. She never participates in exercise. (She did not bring any meter nor logs to review today.  Her most recent A1c was 9.5% in April 2019.) An ACE inhibitor/angiotensin II receptor blocker is not being taken. She does not see a podiatrist.Eye exam is not current.  Hyperlipidemia  This is a chronic problem. The current episode started more than 1 year ago. The problem is uncontrolled. Exacerbating diseases include diabetes and obesity. Pertinent negatives include no chest pain, myalgias or shortness of breath. Current antihyperlipidemic treatment includes statins. Risk factors for coronary artery disease include diabetes mellitus, dyslipidemia, hypertension, obesity, a sedentary lifestyle and post-menopausal.  Hypertension  Associated symptoms include blurred vision. Pertinent negatives include no chest pain,  headaches, palpitations or shortness of breath. Risk factors for coronary artery disease include dyslipidemia, diabetes mellitus, obesity, sedentary lifestyle and post-menopausal state. Past treatments include calcium channel blockers and beta blockers.      Review of Systems  Constitutional: Positive for fatigue. Negative for chills, fever and unexpected weight change.  HENT: Negative for trouble swallowing and voice change.   Eyes: Positive for blurred vision. Negative for visual disturbance.  Respiratory: Negative for cough, shortness of breath and wheezing.   Cardiovascular: Negative for chest pain, palpitations and leg swelling.  Gastrointestinal: Negative for diarrhea, nausea and vomiting.  Endocrine: Positive for polydipsia and polyuria. Negative for cold intolerance, heat intolerance and polyphagia.  Musculoskeletal: Negative for arthralgias and myalgias.  Skin: Negative for color change, pallor,  rash and wound.  Neurological: Negative for seizures and headaches.  Psychiatric/Behavioral: Negative for confusion and suicidal ideas.    Objective:    BP 132/87   Pulse 90   Ht 5\' 10"  (1.778 m)   Wt (!) 383 lb (173.7 kg)   BMI 54.95 kg/m   Wt Readings from Last 3 Encounters:  09/02/17 (!) 383 lb (173.7 kg)  08/30/17 (!) 384 lb 1.3 oz (174.2 kg)  08/24/17 (!) 391 lb (177.4 kg)     Physical Exam  Constitutional: She is oriented to person, place, and time. She appears well-developed.  Poor general hygiene.  HENT:  Head: Normocephalic and atraumatic.  Poor dental condition.  Eyes: EOM are normal.  Neck: Normal range of motion. Neck supple. No tracheal deviation present. No thyromegaly present.  Cardiovascular: Normal rate and regular rhythm.  Pulmonary/Chest: Effort normal and breath sounds normal.  Abdominal: Soft. Bowel sounds are normal. There is no tenderness. There is no guarding.  Musculoskeletal: Normal range of motion. She exhibits no edema.  Neurological: She is  alert and oriented to person, place, and time. She has normal reflexes. No cranial nerve deficit. Coordination normal.  Skin: Skin is warm and dry. No rash noted. No erythema. No pallor.  Psychiatric: She has a normal mood and affect. Judgment normal.    CMP ( most recent) CMP     Component Value Date/Time   NA 133 (L) 08/04/2017 1136   K 4.5 08/04/2017 1136   CL 99 08/04/2017 1136   CO2 27 08/04/2017 1136   GLUCOSE 274 (H) 08/04/2017 1136   BUN 12 08/04/2017 1136   CREATININE 0.88 08/04/2017 1136   CALCIUM 9.0 08/04/2017 1136   PROT 7.5 08/04/2017 1136   ALBUMIN 3.9 07/13/2016 1430   AST 24 08/04/2017 1136   ALT 27 08/04/2017 1136   ALKPHOS 61 07/13/2016 1430   BILITOT 0.4 08/04/2017 1136   GFRNONAA 71 08/04/2017 1136   GFRAA 83 08/04/2017 1136     Diabetic Labs (most recent): Lab Results  Component Value Date   HGBA1C 9.5 (H) 08/04/2017   HGBA1C 8.2 (H) 05/04/2017   HGBA1C 9.3 (H) 01/20/2017     Lipid Panel ( most recent) Lipid Panel     Component Value Date/Time   CHOL 240 (H) 08/30/2017 1615   TRIG 124 08/30/2017 1615   HDL 48 (L) 08/30/2017 1615   CHOLHDL 5.0 (H) 08/30/2017 1615   VLDL 23 07/13/2016 1430   LDLCALC 166 (H) 08/30/2017 1615      Lab Results  Component Value Date   TSH 0.95 07/13/2016   TSH 1.26 07/10/2015   TSH 1.382 11/17/2013   TSH 0.519 04/14/2012   TSH 1.525 11/07/2009       Assessment & Plan:   1. Uncontrolled type 2 diabetes mellitus with hyperglycemia ([place])  - Danija D Egger has currently uncontrolled symptomatic type 2 DM since 62 years of age,  with most recent A1c of 9 point %. Recent labs reviewed.  -her diabetes is complicated by obesity/sedentary life and ADEA GEISEL remains at a high risk for more acute and chronic complications which include CAD, CVA, CKD, retinopathy, and neuropathy. These are all discussed in detail with the patient.  - I have counseled her on diet management and weight loss, by adopting  a carbohydrate restricted/protein rich diet.  - Suggestion is made for her to avoid simple carbohydrates  from her diet including Cakes, Sweet Desserts, Ice Cream, Soda (diet and regular), Sweet Tea, Candies,  Chips, Cookies, Store Bought Juices, Alcohol in Excess of  1-2 drinks a day, Artificial Sweeteners, and "Sugar-free" Products. This will help patient to have stable blood glucose profile and potentially avoid unintended weight gain.  - I encouraged her to switch to  unprocessed or minimally processed complex starch and increased protein intake (animal or plant source), fruits, and vegetables.  - she is advised to stick to a routine mealtimes to eat 3 meals  a day and avoid unnecessary snacks ( to snack only to correct hypoglycemia).   - she will be scheduled with Jearld Fenton, RDN, CDE for individualized diabetes education.  - I have approached her with the following individualized plan to manage diabetes and patient agrees:   -Based on her current glycemic burden, she may need insulin treatment in order for her to achieve control of diabetes to target.  -In preparation, I approach her to start  strict monitoring of glucose 4 times a day-before meals and at bedtime, and return in 1 week with her meter and logs for reevaluation and treatment adjustment. -Patient is encouraged to call clinic for blood glucose levels less than 70 or above 300 mg /dl. - I will continue Metformin 1000 mg p.o. twice daily, Januvia 100 mg p.o. daily, therapeutically suitable for patient . - I will discontinue glipizide, risk outweighs benefit for this patient. -She may benefit from GLP-1 receptor agonists instead of DPP 4 inhibitors once she finishes her current Januvia supplies. - Patient specific target  A1c;  LDL, HDL, Triglycerides, and  Waist Circumference were discussed in detail.  2) BP/HTN: Her blood pressure is controlled to target.  She is advised to continue her current medications including  spironolactone, metoprolol, amlodipine.  3) Lipids/HPL: Her recent lipid panel showed uncontrolled LDL of 166.  She is advised to continue Crestor 10 mg p.o. Nightly.  She will need fasting lipid panel on subsequent visits.  4)  Weight/Diet: CDE Consult will be initiated , exercise, and detailed carbohydrates information provided.  She is a good candidate for bariatric surgery, briefly discussed with her.  5) Chronic Care/Health Maintenance:  -she  Is on Statin medications and  is encouraged to continue to follow up with Ophthalmology, Dentist,  Podiatrist at least yearly or according to recommendations, and advised to   stay away from smoking. I have recommended yearly flu vaccine and pneumonia vaccination at least every 5 years; moderate intensity exercise for up to 150 minutes weekly; and  sleep for at least 7 hours a day.  - I advised patient to maintain close follow up with Fayrene Helper, MD for primary care needs.  - Time spent with the patient: 45 minutes, of which >50% was spent in obtaining information about her symptoms, reviewing her previous labs, evaluations, and treatments, counseling her about her currently uncontrolled type 2 diabetes, hyperlipidemia, hypertension, obesity, and developing a plan to confirm the diagnosis and long term treatment as necessary.  Duane Boston participated in the discussions, expressed understanding, and voiced agreement with the above plans.  All questions were answered to her satisfaction. she is encouraged to contact clinic should she have any questions or concerns prior to her return visit.  Follow up plan: - Return in about 1 week (around 09/09/2017) for follow up with meter and logs- no labs.  Glade Lloyd, MD [facility] [address] [place], [place] [address] Phone: [phone]  Fax: [phone]    09/02/2017, 5:40 PM  This note was partially  dictated with voice recognition  software. Similar sounding words can be transcribed inadequately or may not  be corrected upon review.

## 2017-09-02 NOTE — Patient Instructions (Signed)

## 2017-09-05 ENCOUNTER — Encounter: Payer: Self-pay | Admitting: Family Medicine

## 2017-09-05 NOTE — Assessment & Plan Note (Signed)
Deteriorated, will be followed by endo which she needs Tracey [name] is reminded of the importance of commitment to daily physical activity for 30 minutes or more, as able and the need to limit carbohydrate intake to 30 to 60 grams per meal to help with blood sugar control.   The need to take medication as prescribed, test blood sugar as directed, and to call between visits if there is a concern that blood sugar is uncontrolled is also discussed.   Tracey [name] is reminded of the importance of daily foot exam, annual eye examination, and good blood sugar, blood pressure and cholesterol control.  Diabetic Labs Latest Ref Rng & Units 08/30/2017 08/04/2017 05/04/2017 03/01/2017 01/20/2017  HbA1c <5.7 % of total Hgb - 9.5(H) 8.2(H) - 9.3(H)  Microalbumin mg/dL - - - - 7.2  Micro/Creat Ratio <30 mcg/mg creat - - - - 61(H)  Chol <200 mg/dL 240(H) - 138 - 165  HDL >50 mg/dL 48(L) - 33(L) - 42(L)  Calc LDL mg/dL (calc) 166(H) - 82 - 104(H)  Triglycerides <150 mg/dL 124 - 132 - 98  Creatinine 0.50 - 0.99 mg/dL - 0.88 0.72 0.72 0.84   BP/Weight 09/02/2017 08/30/2017 08/24/2017 07/13/2017 03/29/2017 03/08/2017 93/73/4287  Systolic BP 681 157 - - - 262 -  Diastolic BP 87 70 - - - 80 -  Wt. (Lbs) 383 384.08 391 383 378 382 381  BMI 54.95 55.11 56.1 54.95 54.24 54.81 54.67   Foot/eye exam completion dates Latest Ref Rng & Units 08/19/2016 07/15/2016  Eye Exam No Retinopathy No Retinopathy -  Foot Form Completion - - Done

## 2017-09-05 NOTE — Assessment & Plan Note (Signed)
Controlled, no change in medication DASH diet and commitment to daily physical activity for a minimum of 30 minutes discussed and encouraged, as a part of hypertension management. The importance of attaining a healthy weight is also discussed.  BP/Weight 09/02/2017 08/30/2017 08/24/2017 07/13/2017 03/29/2017 03/08/2017 63/94/3200  Systolic BP 379 444 - - - 619 -  Diastolic BP 87 70 - - - 80 -  Wt. (Lbs) 383 384.08 391 383 378 382 381  BMI 54.95 55.11 56.1 54.95 54.24 54.81 54.67

## 2017-09-05 NOTE — Assessment & Plan Note (Signed)
Hyperlipidemia:Low fat diet discussed and encouraged.   Lipid Panel  Lab Results  Component Value Date   CHOL 240 (H) 08/30/2017   HDL 48 (L) 08/30/2017   LDLCALC 166 (H) 08/30/2017   TRIG 124 08/30/2017   CHOLHDL 5.0 (H) 08/30/2017  uncontrolled, her liver enzymes are back to normal , needs to follow low fat diet and will re introduce low dose statin with 5 week repeat of liver enzymes

## 2017-09-05 NOTE — Progress Notes (Signed)
Tracey [name]     MRN: [id number]      DOB: 19-Feb-1956   HPI Tracey [name] is here for follow up and re-evaluation of chronic medical conditions, medication management and review of any available recent lab and radiology data.  Preventive health is updated, specifically  Cancer screening and Immunization.   Questions or concerns regarding consultations or procedures which the PT has had in the interim are  addressed. The PT denies any adverse reactions to current medications since the last visit.  There are no new concerns.  There are no specific complaints  Denies polyuria, polydipsia, blurred vision , or hypoglycemic episodes Still experiencing uncontrolled and elevated blood sugars with elevated numbers, sees endo later this week No refular exercise or weight loss commitment  ROS Denies recent fever or chills. Denies sinus pressure, nasal congestion, ear pain or sore throat. Denies chest congestion, productive cough or wheezing. Denies chest pains, palpitations and leg swelling Denies abdominal pain, nausea, vomiting,diarrhea or constipation.   Denies dysuria, frequency, hesitancy or incontinence. C/o joint pain, swelling and limitation in mobility. Denies headaches, seizures, numbness, or tingling. Denies depression, anxiety or insomnia. Denies skin break down or rash.   PE  BP 118/70 (BP Location: Right Arm, Patient Position: Sitting, Cuff Size: Large)   Pulse 94   Resp 16   Ht 5\' 10"  (1.778 m)   Wt (!) 384 lb 1.3 oz (174.2 kg)   SpO2 97%   BMI 55.11 kg/m   Patient alert and oriented and in no cardiopulmonary distress.  HEENT: No facial asymmetry, EOMI,   oropharynx pink and moist.  Neck supple no JVD, no mass.  Chest: Clear to auscultation bilaterally.  CVS: S1, S2 no murmurs, no S3.Regular rate.  ABD: Soft non tender.   Ext: No edema  MS: Adequate though reduced  ROM spine, shoulders, hips and knees.  Skin: Intact, no ulcerations or rash noted.Toenails  excessively long and thick  Psych: Good eye contact, normal affect. Memory intact not anxious or depressed appearing.  CNS: CN 2-12 intact, power,  normal throughout.reduced sensation in feet  Assessment & Plan  Essential hypertension, benign Controlled, no change in medication DASH diet and commitment to daily physical activity for a minimum of 30 minutes discussed and encouraged, as a part of hypertension management. The importance of attaining a healthy weight is also discussed.  BP/Weight 09/02/2017 08/30/2017 08/24/2017 07/13/2017 03/29/2017 03/08/2017 09/32/6712  Systolic BP 458 099 - - - 833 -  Diastolic BP 87 70 - - - 80 -  Wt. (Lbs) 383 384.08 391 383 378 382 381  BMI 54.95 55.11 56.1 54.95 54.24 54.81 54.67       Morbid obesity Improved.Patient re-educated about  the importance of commitment to a  minimum of 150 minutes of exercise per week.  The importance of healthy food choices with portion control discussed. Encouraged to start a food diary, count calories and to consider  joining a support group. Sample diet sheets offered. Goals set by the patient for the next several months.   Weight /BMI 09/02/2017 08/30/2017 08/24/2017  WEIGHT 383 lb 384 lb 1.3 oz 391 lb  HEIGHT 5\' 10"  5\' 10"  -  BMI 54.95 kg/m2 55.11 kg/m2 56.1 kg/m2      Uncontrolled type 2 diabetes mellitus with hyperglycemia ([facility]) Deteriorated, will be followed by endo which she needs Tracey [name] is reminded of the importance of commitment to daily physical activity for 30 minutes or more, as able and the need to  limit carbohydrate intake to 30 to 60 grams per meal to help with blood sugar control.   The need to take medication as prescribed, test blood sugar as directed, and to call between visits if there is a concern that blood sugar is uncontrolled is also discussed.   Tracey [name] is reminded of the importance of daily foot exam, annual eye examination, and good blood sugar, blood pressure and cholesterol  control.  Diabetic Labs Latest Ref Rng & Units 08/30/2017 08/04/2017 05/04/2017 03/01/2017 01/20/2017  HbA1c <5.7 % of total Hgb - 9.5(H) 8.2(H) - 9.3(H)  Microalbumin mg/dL - - - - 7.2  Micro/Creat Ratio <30 mcg/mg creat - - - - 61(H)  Chol <200 mg/dL 240(H) - 138 - 165  HDL >50 mg/dL 48(L) - 33(L) - 42(L)  Calc LDL mg/dL (calc) 166(H) - 82 - 104(H)  Triglycerides <150 mg/dL 124 - 132 - 98  Creatinine 0.50 - 0.99 mg/dL - 0.88 0.72 0.72 0.84   BP/Weight 09/02/2017 08/30/2017 08/24/2017 07/13/2017 03/29/2017 03/08/2017 98/26/4158  Systolic BP 309 407 - - - 680 -  Diastolic BP 87 70 - - - 80 -  Wt. (Lbs) 383 384.08 391 383 378 382 381  BMI 54.95 55.11 56.1 54.95 54.24 54.81 54.67   Foot/eye exam completion dates Latest Ref Rng & Units 08/19/2016 07/15/2016  Eye Exam No Retinopathy No Retinopathy -  Foot Form Completion - - Done         Mixed hyperlipidemia Hyperlipidemia:Low fat diet discussed and encouraged.   Lipid Panel  Lab Results  Component Value Date   CHOL 240 (H) 08/30/2017   HDL 48 (L) 08/30/2017   LDLCALC 166 (H) 08/30/2017   TRIG 124 08/30/2017   CHOLHDL 5.0 (H) 08/30/2017  uncontrolled, her liver enzymes are back to normal , needs to follow low fat diet and will re introduce low dose statin with 5 week repeat of liver enzymes

## 2017-09-05 NOTE — Assessment & Plan Note (Signed)
Improved.Patient re-educated about  the importance of commitment to a  minimum of 150 minutes of exercise per week.  The importance of healthy food choices with portion control discussed. Encouraged to start a food diary, count calories and to consider  joining a support group. Sample diet sheets offered. Goals set by the patient for the next several months.   Weight /BMI 09/02/2017 08/30/2017 08/24/2017  WEIGHT 383 lb 384 lb 1.3 oz 391 lb  HEIGHT 5\' 10"  5\' 10"  -  BMI 54.95 kg/m2 55.11 kg/m2 56.1 kg/m2

## 2017-09-16 ENCOUNTER — Ambulatory Visit (INDEPENDENT_AMBULATORY_CARE_PROVIDER_SITE_OTHER): Payer: Medicare Other | Admitting: "Endocrinology

## 2017-09-16 ENCOUNTER — Encounter: Payer: Self-pay | Admitting: "Endocrinology

## 2017-09-16 VITALS — BP 133/82 | HR 90 | Ht 70.0 in | Wt 382.0 lb

## 2017-09-16 DIAGNOSIS — I10 Essential (primary) hypertension: Secondary | ICD-10-CM | POA: Diagnosis not present

## 2017-09-16 DIAGNOSIS — E1165 Type 2 diabetes mellitus with hyperglycemia: Secondary | ICD-10-CM | POA: Diagnosis not present

## 2017-09-16 DIAGNOSIS — E782 Mixed hyperlipidemia: Secondary | ICD-10-CM

## 2017-09-16 NOTE — Progress Notes (Signed)
Endocrinology Consult Note       09/16/2017, 5:16 PM   Subjective:    Patient ID: Tracey [name], female    DOB: 15-Aug-1955.  Tracey [name] is being seen in consultation for management of currently uncontrolled symptomatic diabetes requested by  Fayrene Helper, MD.   Past Medical History:  Diagnosis Date  . Anginal pain ([place])   . Arthritis   . Diabetes mellitus without complication ([place])   . Diastolic dysfunction 08/8097   Grade 1  . GERD (gastroesophageal reflux disease)   . Hyperlipidemia   . Hyperlipidemia   . Hypertension 04/15/2012  . Morbid obesity ([place])   . PE (pulmonary embolism)   . Pulmonary emboli ([place]) 1999   post TAH  . Pulmonary embolism ([place])   . Sleep apnea    Past Surgical History:  Procedure Laterality Date  . ABDOMINAL HYSTERECTOMY  1999   , totalfor fibroids  . CESAREAN SECTION    . CHOLECYSTECTOMY  1996  . COLONOSCOPY N/A 10/31/2015   Procedure: COLONOSCOPY;  Surgeon: Rogene Houston, MD;  Location: [facility];  Service: Endoscopy;  Laterality: N/A;  930   Social History   Socioeconomic History  . Marital status: Single    Spouse name: Not on file  . Number of children: Not on file  . Years of education: Not on file  . Highest education level: Not on file  Occupational History  . Not on file  Social Needs  . Financial resource strain: Not on file  . Food insecurity:    Worry: Not on file    Inability: Not on file  . Transportation needs:    Medical: Not on file    Non-medical: Not on file  Tobacco Use  . Smoking status: Never Smoker  . Smokeless tobacco: Never Used  Substance and Sexual Activity  . Alcohol use: No    Alcohol/week: 0.0 oz  . Drug use: No  . Sexual activity: Not Currently  Lifestyle  . Physical activity:    Days per week: Not on file    Minutes per session: Not on file  . Stress: Not on file  Relationships  . Social  connections:    Talks on phone: Not on file    Gets together: Not on file    Attends religious service: Not on file    Active member of club or organization: Not on file    Attends meetings of clubs or organizations: Not on file    Relationship status: Not on file  Other Topics Concern  . Not on file  Social History Narrative  . Not on file   Outpatient Encounter Medications as of 09/16/2017  Medication Sig  . ACCU-CHEK SOFTCLIX LANCETS lancets Use as instructed twice daily dx E11.65  . amLODipine (NORVASC) 2.5 MG tablet TAKE ONE (1) TABLET BY MOUTH EVERY DAY  . aspirin EC 81 MG tablet Take 1 tablet (81 mg total) by mouth daily.  Marland Kitchen glucose blood (ACCU-CHEK AVIVA) test strip Use as instructed twice daily dx E11.65  . ibuprofen (ADVIL,MOTRIN) 200 MG tablet Take 200 mg by mouth every 8 (eight)  hours as needed for moderate pain.   Marland Kitchen JANUVIA 100 MG tablet TAKE ONE (1) TABLET EACH DAY. DISCONTINUE JANUMET  . metFORMIN (GLUCOPHAGE) 1000 MG tablet Take 1 tablet (1,000 mg total) by mouth 2 (two) times daily with a meal.  . metoprolol tartrate (LOPRESSOR) 25 MG tablet TAKE ONE TABLET TWICE DAILY  . omeprazole (PRILOSEC) 20 MG capsule TAKE ONE CAPSULE BY MOUTH DAILY  . rosuvastatin (CRESTOR) 10 MG tablet Take 1 tablet (10 mg total) by mouth daily.  Marland Kitchen spironolactone (ALDACTONE) 25 MG tablet TAKE ONE (1) TABLET BY MOUTH EVERY DAY   No facility-administered encounter medications on file as of 09/16/2017.     ALLERGIES: Allergies  Allergen Reactions  . Haloperidol Lactate Anaphylaxis  . Lipitor [Atorvastatin Calcium] Other (See Comments)    Markedly elevated liver enzymes    VACCINATION STATUS: Immunization History  Administered Date(s) Administered  . Influenza,inj,Quad PF,6+ Mos 04/01/2015, 03/16/2016, 01/20/2017  . Pneumococcal Polysaccharide-23 05/13/2015    Diabetes  She presents for her follow-up diabetic visit. She has type 2 diabetes mellitus. Onset time: He was diagnosed at  approximate age of 62 years. Her disease course has been stable. There are no hypoglycemic associated symptoms. Pertinent negatives for hypoglycemia include no confusion, headaches, pallor or seizures. Associated symptoms include blurred vision, fatigue, polydipsia and polyuria. Pertinent negatives for diabetes include no chest pain and no polyphagia. There are no hypoglycemic complications. Symptoms are stable. There are no diabetic complications. Risk factors for coronary artery disease include diabetes mellitus, dyslipidemia, family history, hypertension, obesity, post-menopausal and sedentary lifestyle. Current diabetic treatment includes oral agent (triple therapy). Her weight is stable. She is following a generally unhealthy diet. When asked about meal planning, she reported none. She has had a previous visit with a dietitian. She never participates in exercise. Her breakfast blood glucose range is generally 180-200 mg/dl. Her lunch blood glucose range is generally 180-200 mg/dl. Her dinner blood glucose range is generally 180-200 mg/dl. Her bedtime blood glucose range is generally 180-200 mg/dl. Her overall blood glucose range is 180-200 mg/dl. (She brings in a log showing better and near target blood glucose profile.    Her most recent A1c was 9.5% in April 2019.) An ACE inhibitor/angiotensin II receptor blocker is not being taken. She does not see a podiatrist.Eye exam is not current.  Hyperlipidemia  This is a chronic problem. The current episode started more than 1 year ago. The problem is uncontrolled. Exacerbating diseases include diabetes and obesity. Pertinent negatives include no chest pain, myalgias or shortness of breath. Current antihyperlipidemic treatment includes statins. Risk factors for coronary artery disease include diabetes mellitus, dyslipidemia, hypertension, obesity, a sedentary lifestyle and post-menopausal.  Hypertension  Associated symptoms include blurred vision. Pertinent  negatives include no chest pain, headaches, palpitations or shortness of breath. Risk factors for coronary artery disease include dyslipidemia, diabetes mellitus, obesity, sedentary lifestyle and post-menopausal state. Past treatments include calcium channel blockers and beta blockers.      Review of Systems  Constitutional: Positive for fatigue. Negative for chills, fever and unexpected weight change.  HENT: Negative for trouble swallowing and voice change.   Eyes: Positive for blurred vision. Negative for visual disturbance.  Respiratory: Negative for cough, shortness of breath and wheezing.   Cardiovascular: Negative for chest pain, palpitations and leg swelling.  Gastrointestinal: Negative for diarrhea, nausea and vomiting.  Endocrine: Positive for polydipsia and polyuria. Negative for cold intolerance, heat intolerance and polyphagia.  Musculoskeletal: Negative for arthralgias and myalgias.  Skin: Negative for  color change, pallor, rash and wound.  Neurological: Negative for seizures and headaches.  Psychiatric/Behavioral: Negative for confusion and suicidal ideas.    Objective:    BP 133/82   Pulse 90   Ht 5\' 10"  (1.778 m)   Wt (!) 382 lb (173.3 kg)   BMI 54.81 kg/m   Wt Readings from Last 3 Encounters:  09/16/17 (!) 382 lb (173.3 kg)  09/02/17 (!) 383 lb (173.7 kg)  08/30/17 (!) 384 lb 1.3 oz (174.2 kg)     Physical Exam  Constitutional: She is oriented to person, place, and time. She appears well-developed.  Poor general hygiene.  HENT:  Head: Normocephalic and atraumatic.  Poor dental condition.  Eyes: EOM are normal.  Neck: Normal range of motion. Neck supple. No tracheal deviation present. No thyromegaly present.  Cardiovascular: Normal rate.  Pulmonary/Chest: Effort normal.  Abdominal: There is no tenderness. There is no guarding.  Musculoskeletal: Normal range of motion. She exhibits no edema.  Neurological: She is alert and oriented to person, place, and  time. She has normal reflexes. No cranial nerve deficit. Coordination normal.  Skin: Skin is warm and dry. No rash noted. No erythema. No pallor.  Psychiatric: She has a normal mood and affect. Judgment normal.    CMP ( most recent) CMP     Component Value Date/Time   NA 133 (L) 08/04/2017 1136   K 4.5 08/04/2017 1136   CL 99 08/04/2017 1136   CO2 27 08/04/2017 1136   GLUCOSE 274 (H) 08/04/2017 1136   BUN 12 08/04/2017 1136   CREATININE 0.88 08/04/2017 1136   CALCIUM 9.0 08/04/2017 1136   PROT 7.5 08/04/2017 1136   ALBUMIN 3.9 07/13/2016 1430   AST 24 08/04/2017 1136   ALT 27 08/04/2017 1136   ALKPHOS 61 07/13/2016 1430   BILITOT 0.4 08/04/2017 1136   GFRNONAA 71 08/04/2017 1136   GFRAA 83 08/04/2017 1136     Diabetic Labs (most recent): Lab Results  Component Value Date   HGBA1C 9.5 (H) 08/04/2017   HGBA1C 8.2 (H) 05/04/2017   HGBA1C 9.3 (H) 01/20/2017     Lipid Panel ( most recent) Lipid Panel     Component Value Date/Time   CHOL 240 (H) 08/30/2017 1615   TRIG 124 08/30/2017 1615   HDL 48 (L) 08/30/2017 1615   CHOLHDL 5.0 (H) 08/30/2017 1615   VLDL 23 07/13/2016 1430   LDLCALC 166 (H) 08/30/2017 1615      Lab Results  Component Value Date   TSH 0.95 07/13/2016   TSH 1.26 07/10/2015   TSH 1.382 11/17/2013   TSH 0.519 04/14/2012   TSH 1.525 11/07/2009       Assessment & Plan:   1. Uncontrolled type 2 diabetes mellitus with hyperglycemia ([place])  - Tracey [name] has currently uncontrolled symptomatic type 2 DM since 62 years of age. -She returns with improved glycemic profile averaging between 180-200.  Her A1c prior to her last visit was 9.5%.  - Recent labs reviewed.  -her diabetes is complicated by obesity/sedentary life and Tracey [name] remains at a high risk for more acute and chronic complications which include CAD, CVA, CKD, retinopathy, and neuropathy. These are all discussed in detail with the patient.  - I have counseled her on  diet management and weight loss, by adopting a carbohydrate restricted/protein rich diet.  -  Suggestion is made for her to avoid simple carbohydrates  from her diet including Cakes, Sweet Desserts / Pastries, Ice Cream, Soda (  diet and regular), Sweet Tea, Candies, Chips, Cookies, Store Bought Juices, Alcohol in Excess of  1-2 drinks a day, Artificial Sweeteners, and "Sugar-free" Products. This will help patient to have stable blood glucose profile and potentially avoid unintended weight gain.  - I encouraged her to switch to  unprocessed or minimally processed complex starch and increased protein intake (animal or plant source), fruits, and vegetables.  - she is advised to stick to a routine mealtimes to eat 3 meals  a day and avoid unnecessary snacks ( to snack only to correct hypoglycemia).   - she will be scheduled with Jearld Fenton, RDN, CDE for individualized diabetes education.  - I have approached her with the following individualized plan to manage diabetes and patient agrees:   - Based on her presentation with improved glycemic profile, she will not be provided with insulin treatment at this time . -She is skeptical of insulin treatment, however willing to continue to monitor blood glucose at least one time daily before breakfast.   -Patient is encouraged to call clinic for blood glucose levels less than 70 or above 200 mg /dl. -In the meantime, I advised her to continue metformin 1000 mg p.o. twice daily, and Januvia 100 mg p.o. daily with breakfast.  After she finishes her Januvia, she will be considered for weekly GLP-1 receptor agonists.  - Patient specific target  A1c;  LDL, HDL, Triglycerides, and  Waist Circumference were discussed in detail.  2) BP/HTN: Her blood pressure is controlled to target.    She is advised to continue her current medications including spironolactone, metoprolol, amlodipine.  3) Lipids/HPL: Her recent lipid panel showed uncontrolled LDL of 166.  She is  advised to continue Crestor 10 mg p.o. nightly.  She will need fasting lipid panel on subsequent visits.  4)  Weight/Diet: CDE Consult will be initiated , exercise, and detailed carbohydrates information provided.  She is a good candidate for bariatric surgery, briefly discussed with her.  5) Chronic Care/Health Maintenance:  -she  Is on Statin medications and  is encouraged to continue to follow up with Ophthalmology, Dentist,  Podiatrist at least yearly or according to recommendations, and advised to   stay away from smoking. I have recommended yearly flu vaccine and pneumonia vaccination at least every 5 years; moderate intensity exercise for up to 150 minutes weekly; and  sleep for at least 7 hours a day.  - I advised patient to maintain close follow up with Fayrene Helper, MD for primary care needs.  - Time spent with the patient: 25 min, of which >50% was spent in reviewing her blood glucose logs , discussing her hypo- and hyper-glycemic episodes, reviewing her current and  previous labs and insulin doses and developing a plan to avoid hypo- and hyper-glycemia. Please refer to Patient Instructions for Blood Glucose Monitoring and Insulin/Medications Dosing Guide"  in media tab for additional information. Tracey [name] participated in the discussions, expressed understanding, and voiced agreement with the above plans.  All questions were answered to her satisfaction. she is encouraged to contact clinic should she have any questions or concerns prior to her return visit.  Follow up plan: - Return in about 7 weeks (around 11/04/2017) for follow up with pre-visit labs, meter, and logs.  Glade Lloyd, MD [facility] [address] [place], [place] [address] Phone: [phone]  Fax: [phone]    09/16/2017, 5:16 PM  This note was partially dictated with voice recognition software. Similar  sounding words can be transcribed  inadequately or may not  be corrected upon review.

## 2017-09-16 NOTE — Patient Instructions (Signed)

## 2017-09-17 DIAGNOSIS — E782 Mixed hyperlipidemia: Secondary | ICD-10-CM

## 2017-09-24 ENCOUNTER — Ambulatory Visit: Payer: Medicare Other | Admitting: Podiatry

## 2017-10-05 ENCOUNTER — Other Ambulatory Visit: Payer: Self-pay | Admitting: Internal Medicine

## 2017-10-05 ENCOUNTER — Other Ambulatory Visit: Payer: Self-pay | Admitting: Family Medicine

## 2017-10-08 ENCOUNTER — Ambulatory Visit: Payer: Medicare Other | Admitting: Podiatry

## 2017-10-13 ENCOUNTER — Encounter: Payer: Self-pay | Admitting: Podiatry

## 2017-10-13 ENCOUNTER — Ambulatory Visit (INDEPENDENT_AMBULATORY_CARE_PROVIDER_SITE_OTHER): Payer: Medicare Other | Admitting: Podiatry

## 2017-10-13 DIAGNOSIS — M79675 Pain in left toe(s): Secondary | ICD-10-CM

## 2017-10-13 DIAGNOSIS — E1142 Type 2 diabetes mellitus with diabetic polyneuropathy: Secondary | ICD-10-CM

## 2017-10-13 DIAGNOSIS — M79674 Pain in right toe(s): Secondary | ICD-10-CM | POA: Diagnosis not present

## 2017-10-13 DIAGNOSIS — B351 Tinea unguium: Secondary | ICD-10-CM

## 2017-10-14 NOTE — Progress Notes (Signed)
Subjective: [name] presents today with diabetes, diabetic neuropathy and cc of painful, discolored, thick toenails which interfere with activities of daily living. Her pain is aggravated when wearing enclosed shoe gear. Pain is getting progressively worse and relieved with periodic professional debridement.  Objective: There were no vitals filed for this visit. Vascular Examination: Capillary refill time <3 seconds x 10 digits Dorsalis pedis and Posterior tibial pulses present b/l No digital hair x 10 digits Skin temperature gradient within normal limits b/l.  Dermatological Examination: Skin with normal turgor, texture and tone b/l Toenails 1-5 b/l discolored, thick, dystrophic with subungual debris and pain with palpation to nailbeds due to thickness of nails.  Musculoskeletal: Muscle strength 5/5 to all LE muscle groups  Neurological: Sensation diminished with 10 gram monofilament. Vibratory sensation diminished  Assessment: 1. Painful onychomycosis toenails 1-5 b/l 2. NIDDM with Diabetic neuropathy  Plan: 1. Continue diabetic foot care principles.  2. Toenails 1-5 b/l were debrided in length and girth without iatrogenic bleeding. 3. Patient to continue soft, supportive shoe gear 4. Patient to report any pedal injuries to medical professional  5. Follow up 3 months.  6. Patient/POA to call should there be a concern in the interim.

## 2017-10-20 DIAGNOSIS — H40013 Open angle with borderline findings, low risk, bilateral: Secondary | ICD-10-CM | POA: Diagnosis not present

## 2017-10-20 DIAGNOSIS — E119 Type 2 diabetes mellitus without complications: Secondary | ICD-10-CM | POA: Diagnosis not present

## 2017-10-20 DIAGNOSIS — H2513 Age-related nuclear cataract, bilateral: Secondary | ICD-10-CM | POA: Diagnosis not present

## 2017-10-21 ENCOUNTER — Other Ambulatory Visit: Payer: Self-pay | Admitting: Family Medicine

## 2017-10-22 ENCOUNTER — Other Ambulatory Visit: Payer: Self-pay | Admitting: Family Medicine

## 2017-10-22 DIAGNOSIS — E782 Mixed hyperlipidemia: Secondary | ICD-10-CM | POA: Diagnosis not present

## 2017-10-22 LAB — HEPATIC FUNCTION PANEL
AG RATIO: 1.2 (calc) (ref 1.0–2.5)
ALBUMIN MSPROF: 4.2 g/dL (ref 3.6–5.1)
ALT: 26 U/L (ref 6–29)
AST: 23 U/L (ref 10–35)
Alkaline phosphatase (APISO): 73 U/L (ref 33–130)
Bilirubin, Direct: 0.2 mg/dL (ref 0.0–0.2)
Globulin: 3.4 g/dL (calc) (ref 1.9–3.7)
Indirect Bilirubin: 0.5 mg/dL (calc) (ref 0.2–1.2)
TOTAL PROTEIN: 7.6 g/dL (ref 6.1–8.1)
Total Bilirubin: 0.7 mg/dL (ref 0.2–1.2)

## 2017-11-01 DIAGNOSIS — E1165 Type 2 diabetes mellitus with hyperglycemia: Secondary | ICD-10-CM | POA: Diagnosis not present

## 2017-11-02 LAB — COMPLETE METABOLIC PANEL WITH GFR
AG Ratio: 1.2 (calc) (ref 1.0–2.5)
ALT: 32 U/L — AB (ref 6–29)
AST: 31 U/L (ref 10–35)
Albumin: 4.2 g/dL (ref 3.6–5.1)
Alkaline phosphatase (APISO): 65 U/L (ref 33–130)
BUN: 12 mg/dL (ref 7–25)
CO2: 23 mmol/L (ref 20–32)
CREATININE: 0.75 mg/dL (ref 0.50–0.99)
Calcium: 9.2 mg/dL (ref 8.6–10.4)
Chloride: 102 mmol/L (ref 98–110)
GFR, EST NON AFRICAN AMERICAN: 87 mL/min/{1.73_m2} (ref >=60)
GFR, Est African American: 100 mL/min/{1.73_m2} (ref >=60)
GLOBULIN: 3.5 g/dL (ref 1.9–3.7)
Glucose, Bld: 254 mg/dL — ABNORMAL HIGH (ref 65–139)
Potassium: 4.2 mmol/L (ref 3.5–5.3)
SODIUM: 138 mmol/L (ref 135–146)
Total Bilirubin: 0.5 mg/dL (ref 0.2–1.2)
Total Protein: 7.7 g/dL (ref 6.1–8.1)

## 2017-11-02 LAB — HEMOGLOBIN A1C
HEMOGLOBIN A1C: 9.3 %{Hb} — AB (ref <5.7)
MEAN PLASMA GLUCOSE: 220 (calc)
eAG (mmol/L): 12.2 (calc)

## 2017-11-03 ENCOUNTER — Encounter: Payer: Self-pay | Admitting: "Endocrinology

## 2017-11-04 ENCOUNTER — Ambulatory Visit (INDEPENDENT_AMBULATORY_CARE_PROVIDER_SITE_OTHER): Payer: Medicare Other | Admitting: "Endocrinology

## 2017-11-04 ENCOUNTER — Encounter: Payer: Self-pay | Admitting: "Endocrinology

## 2017-11-04 VITALS — BP 132/84 | HR 90 | Ht 70.0 in | Wt 382.0 lb

## 2017-11-04 DIAGNOSIS — E782 Mixed hyperlipidemia: Secondary | ICD-10-CM | POA: Diagnosis not present

## 2017-11-04 DIAGNOSIS — I10 Essential (primary) hypertension: Secondary | ICD-10-CM | POA: Diagnosis not present

## 2017-11-04 DIAGNOSIS — E1165 Type 2 diabetes mellitus with hyperglycemia: Secondary | ICD-10-CM

## 2017-11-04 MED ORDER — INSULIN DEGLUDEC 200 UNIT/ML ~~LOC~~ SOPN: 20.0000 [IU] | PEN_INJECTOR | Freq: Every day | SUBCUTANEOUS | 2 refills | Status: DC

## 2017-11-04 MED ORDER — INSULIN PEN NEEDLE 31G X 8 MM MISC: 1.0000 | 3 refills | Status: DC

## 2017-11-04 NOTE — Patient Instructions (Signed)

## 2017-11-04 NOTE — Progress Notes (Signed)
Endocrinology follow-up note       11/04/2017, 4:51 PM   Subjective:    Patient ID: Tracey [name], female    DOB: 1955/12/16.  Tracey [name] is being seen in follow-up for management of currently uncontrolled symptomatic diabetes requested by  Fayrene Helper, MD.   Past Medical History:  Diagnosis Date  . Anginal pain ([place])   . Arthritis   . Diabetes mellitus without complication ([place])   . Diastolic dysfunction 12/4501   Grade 1  . GERD (gastroesophageal reflux disease)   . Hyperlipidemia   . Hyperlipidemia   . Hypertension 04/15/2012  . Morbid obesity ([place])   . PE (pulmonary embolism)   . Pulmonary emboli ([place]) 1999   post TAH  . Pulmonary embolism ([place])   . Sleep apnea    Past Surgical History:  Procedure Laterality Date  . ABDOMINAL HYSTERECTOMY  1999   , totalfor fibroids  . CESAREAN SECTION    . CHOLECYSTECTOMY  1996  . COLONOSCOPY N/A 10/31/2015   Procedure: COLONOSCOPY;  Surgeon: Rogene Houston, MD;  Location: [facility];  Service: Endoscopy;  Laterality: N/A;  930   Social History   Socioeconomic History  . Marital status: Single    Spouse name: Not on file  . Number of children: Not on file  . Years of education: Not on file  . Highest education level: Not on file  Occupational History  . Not on file  Social Needs  . Financial resource strain: Not on file  . Food insecurity:    Worry: Not on file    Inability: Not on file  . Transportation needs:    Medical: Not on file    Non-medical: Not on file  Tobacco Use  . Smoking status: Never Smoker  . Smokeless tobacco: Never Used  Substance and Sexual Activity  . Alcohol use: No    Alcohol/week: 0.0 oz  . Drug use: No  . Sexual activity: Not Currently  Lifestyle  . Physical activity:    Days per week: Not on file    Minutes per session: Not on file  . Stress: Not on file  Relationships  . Social  connections:    Talks on phone: Not on file    Gets together: Not on file    Attends religious service: Not on file    Active member of club or organization: Not on file    Attends meetings of clubs or organizations: Not on file    Relationship status: Not on file  Other Topics Concern  . Not on file  Social History Narrative  . Not on file   Outpatient Encounter Medications as of 11/04/2017  Medication Sig  . ACCU-CHEK SOFTCLIX LANCETS lancets Use as instructed twice daily dx E11.65  . amLODipine (NORVASC) 2.5 MG tablet TAKE 1 TABLET BY MOUTH EVERY DAY  . aspirin EC 81 MG tablet Take 1 tablet (81 mg total) by mouth daily.  Marland Kitchen glucose blood (ACCU-CHEK AVIVA) test strip Use as instructed twice daily dx E11.65  . ibuprofen (ADVIL,MOTRIN) 200 MG tablet Take 200 mg by mouth every 8 (eight) hours  as needed for moderate pain.   . Insulin Degludec (TRESIBA FLEXTOUCH) 200 UNIT/ML SOPN Inject 20 Units into the skin at bedtime.  . Insulin Pen Needle (B-D ULTRAFINE III SHORT PEN) 31G X 8 MM MISC 1 each by Does not apply route as directed.  . metFORMIN (GLUCOPHAGE) 1000 MG tablet Take 1 tablet (1,000 mg total) by mouth 2 (two) times daily with a meal.  . metoprolol tartrate (LOPRESSOR) 25 MG tablet TAKE ONE TABLET TWICE DAILY  . omeprazole (PRILOSEC) 20 MG capsule TAKE ONE CAPSULE BY MOUTH DAILY  . rosuvastatin (CRESTOR) 10 MG tablet Take 1 tablet (10 mg total) by mouth daily.  Marland Kitchen spironolactone (ALDACTONE) 25 MG tablet TAKE ONE (1) TABLET BY MOUTH EVERY DAY  . [DISCONTINUED] JANUVIA 100 MG tablet TAKE ONE (1) TABLET EACH DAY. DISCONTINUE JANUMET   No facility-administered encounter medications on file as of 11/04/2017.     ALLERGIES: Allergies  Allergen Reactions  . Haloperidol Lactate Anaphylaxis  . Lipitor [Atorvastatin Calcium] Other (See Comments)    Markedly elevated liver enzymes    VACCINATION STATUS: Immunization History  Administered Date(s) Administered  . Influenza,inj,Quad  PF,6+ Mos 04/01/2015, 03/16/2016, 01/20/2017  . Pneumococcal Polysaccharide-23 05/13/2015    Diabetes  She presents for her follow-up diabetic visit. She has type 2 diabetes mellitus. Onset time: He was diagnosed at approximate age of 26 years. Her disease course has been worsening. There are no hypoglycemic associated symptoms. Pertinent negatives for hypoglycemia include no confusion, headaches, pallor or seizures. Associated symptoms include blurred vision, fatigue, polydipsia and polyuria. Pertinent negatives for diabetes include no chest pain and no polyphagia. There are no hypoglycemic complications. Symptoms are stable. There are no diabetic complications. Risk factors for coronary artery disease include diabetes mellitus, dyslipidemia, family history, hypertension, obesity, post-menopausal and sedentary lifestyle. Current diabetic treatment includes oral agent (triple therapy). Her weight is stable. She is following a generally unhealthy diet. When asked about meal planning, she reported none. She has had a previous visit with a dietitian. She never participates in exercise. Her breakfast blood glucose range is generally 180-200 mg/dl. Her bedtime blood glucose range is generally 180-200 mg/dl. Her overall blood glucose range is 180-200 mg/dl. (She brings in a log showing better and near target blood glucose profile.    Her most recent A1c was 9.5% in April 2019.) An ACE inhibitor/angiotensin II receptor blocker is not being taken. She does not see a podiatrist.Eye exam is not current.  Hyperlipidemia  This is a chronic problem. The current episode started more than 1 year ago. The problem is uncontrolled. Exacerbating diseases include diabetes and obesity. Pertinent negatives include no chest pain, myalgias or shortness of breath. Current antihyperlipidemic treatment includes statins. Risk factors for coronary artery disease include diabetes mellitus, dyslipidemia, hypertension, obesity, a sedentary  lifestyle and post-menopausal.  Hypertension  Associated symptoms include blurred vision. Pertinent negatives include no chest pain, headaches, palpitations or shortness of breath. Risk factors for coronary artery disease include dyslipidemia, diabetes mellitus, obesity, sedentary lifestyle and post-menopausal state. Past treatments include calcium channel blockers and beta blockers.     Review of Systems  Constitutional: Positive for fatigue. Negative for chills, fever and unexpected weight change.  HENT: Negative for trouble swallowing and voice change.   Eyes: Positive for blurred vision. Negative for visual disturbance.  Respiratory: Negative for cough, shortness of breath and wheezing.   Cardiovascular: Negative for chest pain, palpitations and leg swelling.  Gastrointestinal: Negative for diarrhea, nausea and vomiting.  Endocrine: Positive for  polydipsia and polyuria. Negative for cold intolerance, heat intolerance and polyphagia.  Musculoskeletal: Negative for arthralgias and myalgias.  Skin: Negative for color change, pallor, rash and wound.  Neurological: Negative for seizures and headaches.  Psychiatric/Behavioral: Negative for confusion and suicidal ideas.    Objective:    BP 132/84   Pulse 90   Ht 5\' 10"  (1.778 m)   Wt (!) 382 lb (173.3 kg)   BMI 54.81 kg/m   Wt Readings from Last 3 Encounters:  11/04/17 (!) 382 lb (173.3 kg)  09/16/17 (!) 382 lb (173.3 kg)  09/02/17 (!) 383 lb (173.7 kg)      Physical Exam  Constitutional: She is oriented to person, place, and time. She appears well-developed.  Poor general hygiene.  HENT:  Head: Normocephalic and atraumatic.  Poor dental condition.  Eyes: EOM are normal.  Neck: Normal range of motion. Neck supple. No tracheal deviation present. No thyromegaly present.  Cardiovascular: Normal rate.  Pulmonary/Chest: Effort normal.  Abdominal: There is no tenderness. There is no guarding.  Musculoskeletal: Normal range of  motion. She exhibits no edema.  Neurological: She is alert and oriented to person, place, and time. She has normal reflexes. No cranial nerve deficit. Coordination normal.  Skin: Skin is warm and dry. No rash noted. No erythema. No pallor.  Psychiatric: She has a normal mood and affect. Judgment normal.    CMP ( most recent) CMP     Component Value Date/Time   NA 138 11/01/2017 1617   K 4.2 11/01/2017 1617   CL 102 11/01/2017 1617   CO2 23 11/01/2017 1617   GLUCOSE 254 (H) 11/01/2017 1617   BUN 12 11/01/2017 1617   CREATININE 0.75 11/01/2017 1617   CALCIUM 9.2 11/01/2017 1617   PROT 7.7 11/01/2017 1617   ALBUMIN 3.9 07/13/2016 1430   AST 31 11/01/2017 1617   ALT 32 (H) 11/01/2017 1617   ALKPHOS 61 07/13/2016 1430   BILITOT 0.5 11/01/2017 1617   GFRNONAA 87 11/01/2017 1617   GFRAA 100 11/01/2017 1617     Diabetic Labs (most recent): Lab Results  Component Value Date   HGBA1C 9.3 (H) 11/01/2017   HGBA1C 9.5 (H) 08/04/2017   HGBA1C 8.2 (H) 05/04/2017     Lipid Panel ( most recent) Lipid Panel     Component Value Date/Time   CHOL 240 (H) 08/30/2017 1615   TRIG 124 08/30/2017 1615   HDL 48 (L) 08/30/2017 1615   CHOLHDL 5.0 (H) 08/30/2017 1615   VLDL 23 07/13/2016 1430   LDLCALC 166 (H) 08/30/2017 1615      Lab Results  Component Value Date   TSH 0.95 07/13/2016   TSH 1.26 07/10/2015   TSH 1.382 11/17/2013   TSH 0.519 04/14/2012   TSH 1.525 11/07/2009      Lipid Panel     Component Value Date/Time   CHOL 240 (H) 08/30/2017 1615   TRIG 124 08/30/2017 1615   HDL 48 (L) 08/30/2017 1615   CHOLHDL 5.0 (H) 08/30/2017 1615   VLDL 23 07/13/2016 1430   LDLCALC 166 (H) 08/30/2017 1615     Assessment & Plan:   1. Uncontrolled type 2 diabetes mellitus with hyperglycemia ([place])  - Tracey [name] has currently uncontrolled symptomatic type 2 DM since 62 years of age. -She returns with her glucose averaging between 180-200 and fasting.  Her A1c is unchanged  at 9.3%.  - Recent labs reviewed, showing normal renal function.  -her diabetes is complicated by obesity/sedentary life and  Tracey [name] remains at a high risk for more acute and chronic complications which include CAD, CVA, CKD, retinopathy, and neuropathy. These are all discussed in detail with the patient.  - I have counseled her on diet management and weight loss, by adopting a carbohydrate restricted/protein rich diet.  -  Suggestion is made for her to avoid simple carbohydrates  from her diet including Cakes, Sweet Desserts / Pastries, Ice Cream, Soda (diet and regular), Sweet Tea, Candies, Chips, Cookies, Store Bought Juices, Alcohol in Excess of  1-2 drinks a day, Artificial Sweeteners, and "Sugar-free" Products. This will help patient to have stable blood glucose profile and potentially avoid unintended weight gain.   - I encouraged her to switch to  unprocessed or minimally processed complex starch and increased protein intake (animal or plant source), fruits, and vegetables.  - she is advised to stick to a routine mealtimes to eat 3 meals  a day and avoid unnecessary snacks ( to snack only to correct hypoglycemia).   - she has been scheduled with Jearld Fenton, RDN, CDE for individualized diabetes education.  - I have approached her with the following individualized plan to manage diabetes and patient agrees:   - Based on her presentation with improved glycemic profile, she is approached with basal insulin and she accepts reluctantly.   -I discussed and initiated basal insulin with Tresiba 20 units nightly associated with strict monitoring of blood glucose 4 times a day- before meals and at bedtime and return in 2 weeks with her meter and logs for reevaluation.    -Patient is encouraged to call clinic for blood glucose levels less than 70 or above 200 mg /dl. -In the meantime, I advised her to continue metformin 1000 mg p.o. twice daily, and Januvia 100 mg p.o. daily with  breakfast.  After she finishes her Januvia, she will be considered for weekly GLP-1 receptor agonists.   - Patient specific target  A1c;  LDL, HDL, Triglycerides, and  Waist Circumference were discussed in detail.  2) BP/HTN: Her blood pressure is controlled to target.  She is advised to continue her current medications including spironolactone, metoprolol, amlodipine.  3) Lipids/HPL: Her recent lipid panel showed uncontrolled LDL of 166.  She is advised to continue Crestor 10 mg p.o. nightly.  She will need fasting lipid panel on subsequent visits.  4)  Weight/Diet: CDE Consult has been initiated , exercise, and detailed carbohydrates information provided.  She is a good candidate for bariatric surgery, briefly discussed with her.  5) Chronic Care/Health Maintenance:  -she  Is on Statin medications and  is encouraged to continue to follow up with Ophthalmology, Dentist,  Podiatrist at least yearly or according to recommendations, and advised to   stay away from smoking. I have recommended yearly flu vaccine and pneumonia vaccination at least every 5 years; moderate intensity exercise for up to 150 minutes weekly; and  sleep for at least 7 hours a day.  - I advised patient to maintain close follow up with Fayrene Helper, MD for primary care needs.  - Time spent with the patient: 25 min, of which >50% was spent in reviewing her blood glucose logs , discussing her hypo- and hyper-glycemic episodes, reviewing her current and  previous labs and insulin doses and developing a plan to avoid hypo- and hyper-glycemia. Please refer to Patient Instructions for Blood Glucose Monitoring and Insulin/Medications Dosing Guide"  in media tab for additional information. Tracey [name] participated in the discussions, expressed understanding,  and voiced agreement with the above plans.  All questions were answered to her satisfaction. she is encouraged to contact clinic should she have any questions or  concerns prior to her return visit.   Follow up plan: - Return in about 2 weeks (around 11/18/2017) for follow up with meter and logs- no labs.  Glade Lloyd, MD [facility] [address] [place], [place] [address] Phone: [phone]  Fax: [phone]    11/04/2017, 4:51 PM  This note was partially dictated with voice recognition software. Similar sounding words can be transcribed inadequately or may not  be corrected upon review.

## 2017-11-17 ENCOUNTER — Other Ambulatory Visit: Payer: Self-pay

## 2017-11-17 MED ORDER — GLUCOSE BLOOD VI STRP: ORAL_STRIP | 5 refills | Status: DC

## 2017-11-17 MED ORDER — ACCU-CHEK SOFTCLIX LANCETS MISC: 5 refills | Status: DC

## 2017-11-18 ENCOUNTER — Other Ambulatory Visit: Payer: Self-pay | Admitting: Internal Medicine

## 2017-11-18 ENCOUNTER — Other Ambulatory Visit: Payer: Self-pay | Admitting: Family Medicine

## 2017-11-18 ENCOUNTER — Ambulatory Visit: Payer: Medicare Other | Admitting: "Endocrinology

## 2017-11-22 ENCOUNTER — Ambulatory Visit (INDEPENDENT_AMBULATORY_CARE_PROVIDER_SITE_OTHER): Payer: Medicare Other

## 2017-11-22 VITALS — BP 122/80 | HR 108 | Resp 16 | Ht 70.0 in | Wt 382.0 lb

## 2017-11-22 DIAGNOSIS — Z0000 Encounter for general adult medical examination without abnormal findings: Secondary | ICD-10-CM | POA: Diagnosis not present

## 2017-11-22 MED ORDER — UNABLE TO FIND: 0 refills | Status: DC

## 2017-11-22 NOTE — Patient Instructions (Addendum)
Tracey [name] , Thank you for taking time to come for your Medicare Wellness Visit. I appreciate your ongoing commitment to your health goals. Please review the following plan we discussed and let me know if I can assist you in the future.   Screening recommendations/referrals: Colonoscopy: up to date  Mammogram: up date  Bone Density: not indicated  Recommended yearly ophthalmology/optometry visit for glaucoma screening and checkup Recommended yearly dental visit for hygiene and checkup  Vaccinations: Influenza vaccine: due in Sept  Pneumococcal vaccine:  Tdap vaccine: due  Shingles vaccine:  Ask insurance if shingrix covered   Advanced directives: given   Conditions/risks identified: done   Next appointment: scheduled   Preventive Care 40-64 Years, Female Preventive care refers to lifestyle choices and visits with your health care provider that can promote health and wellness. What does preventive care include?  A yearly physical exam. This is also called an annual well check.  Dental exams once or twice a year.  Routine eye exams. Ask your health care provider how often you should have your eyes checked.  Personal lifestyle choices, including:  Daily care of your teeth and gums.  Regular physical activity.  Eating a healthy diet.  Avoiding tobacco and drug use.  Limiting alcohol use.  Practicing safe sex.  Taking low-dose aspirin daily starting at age 46.  Taking vitamin and mineral supplements as recommended by your health care provider. What happens during an annual well check? The services and screenings done by your health care provider during your annual well check will depend on your age, overall health, lifestyle risk factors, and family history of disease. Counseling  Your health care provider may ask you questions about your:  Alcohol use.  Tobacco use.  Drug use.  Emotional well-being.  Home and relationship well-being.  Sexual  activity.  Eating habits.  Work and work Statistician.  Method of birth control.  Menstrual cycle.  Pregnancy history. Screening  You may have the following tests or measurements:  Height, weight, and BMI.  Blood pressure.  Lipid and cholesterol levels. These may be checked every 5 years, or more frequently if you are over 49 years old.  Skin check.  Lung cancer screening. You may have this screening every year starting at age 72 if you have a 30-pack-year history of smoking and currently smoke or have quit within the past 15 years.  Fecal occult blood test (FOBT) of the stool. You may have this test every year starting at age 60.  Flexible sigmoidoscopy or colonoscopy. You may have a sigmoidoscopy every 5 years or a colonoscopy every 10 years starting at age 58.  Hepatitis C blood test.  Hepatitis B blood test.  Sexually transmitted disease (STD) testing.  Diabetes screening. This is done by checking your blood sugar (glucose) after you have not eaten for a while (fasting). You may have this done every 1-3 years.  Mammogram. This may be done every 1-2 years. Talk to your health care provider about when you should start having regular mammograms. This may depend on whether you have a family history of breast cancer.  BRCA-related cancer screening. This may be done if you have a family history of breast, ovarian, tubal, or peritoneal cancers.  Pelvic exam and Pap test. This may be done every 3 years starting at age 72. Starting at age 23, this may be done every 5 years if you have a Pap test in combination with an HPV test.  Bone density scan. This is  done to screen for osteoporosis. You may have this scan if you are at high risk for osteoporosis. Discuss your test results, treatment options, and if necessary, the need for more tests with your health care provider. Vaccines  Your health care provider may recommend certain vaccines, such as:  Influenza vaccine. This is  recommended every year.  Tetanus, diphtheria, and acellular pertussis (Tdap, Td) vaccine. You may need a Td booster every 10 years.  Zoster vaccine. You may need this after age 18.  Pneumococcal 13-valent conjugate (PCV13) vaccine. You may need this if you have certain conditions and were not previously vaccinated.  Pneumococcal polysaccharide (PPSV23) vaccine. You may need one or two doses if you smoke cigarettes or if you have certain conditions. Talk to your health care provider about which screenings and vaccines you need and how often you need them. This information is not intended to replace advice given to you by your health care provider. Make sure you discuss any questions you have with your health care provider. Document Released: 04/26/2015 Document Revised: 12/18/2015 Document Reviewed: 01/29/2015 Elsevier Interactive Patient Education  2017 [place] Prevention in the Home Falls can cause injuries. They can happen to people of all ages. There are many things you can do to make your home safe and to help prevent falls. What can I do on the outside of my home?  Regularly fix the edges of walkways and driveways and fix any cracks.  Remove anything that might make you trip as you walk through a door, such as a raised step or threshold.  Trim any bushes or trees on the path to your home.  Use bright outdoor lighting.  Clear any walking paths of anything that might make someone trip, such as rocks or tools.  Regularly check to see if handrails are loose or broken. Make sure that both sides of any steps have handrails.  Any raised decks and porches should have guardrails on the edges.  Have any leaves, snow, or ice cleared regularly.  Use sand or salt on walking paths during winter.  Clean up any spills in your garage right away. This includes oil or grease spills. What can I do in the bathroom?  Use night lights.  Install grab bars by the toilet and in  the tub and shower. Do not use towel bars as grab bars.  Use non-skid mats or decals in the tub or shower.  If you need to sit down in the shower, use a plastic, non-slip stool.  Keep the floor dry. Clean up any water that spills on the floor as soon as it happens.  Remove soap buildup in the tub or shower regularly.  Attach bath mats securely with double-sided non-slip rug tape.  Do not have throw rugs and other things on the floor that can make you trip. What can I do in the bedroom?  Use night lights.  Make sure that you have a light by your bed that is easy to reach.  Do not use any sheets or blankets that are too big for your bed. They should not hang down onto the floor.  Have a firm chair that has side arms. You can use this for support while you get dressed.  Do not have throw rugs and other things on the floor that can make you trip. What can I do in the kitchen?  Clean up any spills right away.  Avoid walking on wet floors.  Keep items  that you use a lot in easy-to-reach places.  If you need to reach something above you, use a strong step stool that has a grab bar.  Keep electrical cords out of the way.  Do not use floor polish or wax that makes floors slippery. If you must use wax, use non-skid floor wax.  Do not have throw rugs and other things on the floor that can make you trip. What can I do with my stairs?  Do not leave any items on the stairs.  Make sure that there are handrails on both sides of the stairs and use them. Fix handrails that are broken or loose. Make sure that handrails are as long as the stairways.  Check any carpeting to make sure that it is firmly attached to the stairs. Fix any carpet that is loose or worn.  Avoid having throw rugs at the top or bottom of the stairs. If you do have throw rugs, attach them to the floor with carpet tape.  Make sure that you have a light switch at the top of the stairs and the bottom of the stairs. If  you do not have them, ask someone to add them for you. What else can I do to help prevent falls?  Wear shoes that:  Do not have high heels.  Have rubber bottoms.  Are comfortable and fit you well.  Are closed at the toe. Do not wear sandals.  If you use a stepladder:  Make sure that it is fully opened. Do not climb a closed stepladder.  Make sure that both sides of the stepladder are locked into place.  Ask someone to hold it for you, if possible.  Clearly mark and make sure that you can see:  Any grab bars or handrails.  First and last steps.  Where the edge of each step is.  Use tools that help you move around (mobility aids) if they are needed. These include:  Canes.  Walkers.  Scooters.  Crutches.  Turn on the lights when you go into a dark area. Replace any light bulbs as soon as they burn out.  Set up your furniture so you have a clear path. Avoid moving your furniture around.  If any of your floors are uneven, fix them.  If there are any pets around you, be aware of where they are.  Review your medicines with your doctor. Some medicines can make you feel dizzy. This can increase your chance of falling. Ask your doctor what other things that you can do to help prevent falls. This information is not intended to replace advice given to you by your health care provider. Make sure you discuss any questions you have with your health care provider. Document Released: 01/24/2009 Document Revised: 09/05/2015 Document Reviewed: 05/04/2014 Elsevier Interactive Patient Education  2017 [employer].

## 2017-11-22 NOTE — Progress Notes (Signed)
Subjective:   Tracey [name] is a 62 y.o. female who presents for Medicare Annual (Subsequent) preventive examination.  Review of Systems:   Cardiac Risk Factors include: diabetes mellitus;dyslipidemia;hypertension;obesity (BMI >30kg/m2);sedentary lifestyle     Objective:     Vitals: BP 122/80   Pulse (!) 108   Resp 16   Ht 5\' 10"  (1.778 m)   Wt (!) 382 lb (173.3 kg)   SpO2 97%   BMI 54.81 kg/m   Body mass index is 54.81 kg/m.  Advanced Directives 11/22/2017 11/16/2016 10/22/2016 05/12/2016 02/10/2016 10/31/2015 05/09/2015  Does Patient Have a Medical Advance Directive? No No No No No No No  Would patient like information on creating a medical advance directive? Yes (ED - Information included in AVS) Yes (MAU/Ambulatory/Procedural Areas - Information given) - - - No - patient declined information No - patient declined information    Tobacco Social History   Tobacco Use  Smoking Status Never Smoker  Smokeless Tobacco Never Used     Counseling given: Not Answered   Clinical Intake:  Pre-visit preparation completed: Yes  Pain : No/denies pain Pain Score: 0-No pain     Nutritional Status: BMI > 30  Obese Diabetes: Yes CBG done?: No Did pt. bring in CBG monitor from home?: No  How often do you need to have someone help you when you read instructions, pamphlets, or other written materials from your doctor or pharmacy?: 1 - Never What is the last grade level you completed in school?: 12th grade   Interpreter Needed?: No  Information entered by :: brandi hudy LPN   Past Medical History:  Diagnosis Date  . Anginal pain ([place])   . Arthritis   . Diabetes mellitus without complication ([place])   . Diastolic dysfunction 04/5174   Grade 1  . GERD (gastroesophageal reflux disease)   . Hyperlipidemia   . Hyperlipidemia   . Hypertension 04/15/2012  . Morbid obesity ([place])   . PE (pulmonary embolism)   . Pulmonary emboli ([place]) 1999   post TAH  . Pulmonary embolism ([place])    . Sleep apnea    Past Surgical History:  Procedure Laterality Date  . ABDOMINAL HYSTERECTOMY  1999   , totalfor fibroids  . CESAREAN SECTION    . CHOLECYSTECTOMY  1996  . COLONOSCOPY N/A 10/31/2015   Procedure: COLONOSCOPY;  Surgeon: Rogene Houston, MD;  [facility];  Service: Endoscopy;  Laterality: N/A;  930   Family History  Problem Relation Age of Onset  . Diabetes Mother   . Hypertension Mother   . Depression Mother   . Hyperlipidemia Father   . Hypertension Father   . Alcohol abuse Father   . Cirrhosis Father   . Aneurysm Brother   . Sleep apnea Sister   . Arthritis Sister   . Coronary artery disease Sister   . Heart attack Sister 37  . Heart murmur Son   . Hypertension Son   . Depression Son    Social History   Socioeconomic History  . Marital status: Single    Spouse name: Not on file  . Number of children: Not on file  . Years of education: Not on file  . Highest education level: Not on file  Occupational History  . Not on file  Social Needs  . Financial resource strain: Somewhat hard  . Food insecurity:    Worry: Often true    Inability: Often true  . Transportation needs:    Medical: No  Non-medical: No  Tobacco Use  . Smoking status: Never Smoker  . Smokeless tobacco: Never Used  Substance and Sexual Activity  . Alcohol use: No    Alcohol/week: 0.0 standard drinks  . Drug use: No  . Sexual activity: Not Currently  Lifestyle  . Physical activity:    Days per week: 0 days    Minutes per session: 0 min  . Stress: To some extent  Relationships  . Social connections:    Talks on phone: More than three times a week    Gets together: More than three times a week    Attends religious service: Never    Active member of club or organization: No    Attends meetings of clubs or organizations: Never    Relationship status: Divorced  Other Topics Concern  . Not on file  Social History Narrative  . Not on file    Outpatient  Encounter Medications as of 11/22/2017  Medication Sig  . ACCU-CHEK SOFTCLIX LANCETS lancets Use as instructed 4x daily dx E11.65  . amLODipine (NORVASC) 2.5 MG tablet TAKE ONE (1) TABLET BY MOUTH EVERY DAY  . aspirin EC 81 MG tablet Take 1 tablet (81 mg total) by mouth daily.  Marland Kitchen glucose blood (ACCU-CHEK AVIVA) test strip Use as instructed 4 x daily dx E11.65  . ibuprofen (ADVIL,MOTRIN) 200 MG tablet Take 200 mg by mouth every 8 (eight) hours as needed for moderate pain.   . Insulin Degludec (TRESIBA FLEXTOUCH) 200 UNIT/ML SOPN Inject 20 Units into the skin at bedtime.  . Insulin Pen Needle (B-D ULTRAFINE III SHORT PEN) 31G X 8 MM MISC 1 each by Does not apply route as directed.  . metFORMIN (GLUCOPHAGE) 1000 MG tablet Take 1 tablet (1,000 mg total) by mouth 2 (two) times daily with a meal.  . metoprolol tartrate (LOPRESSOR) 25 MG tablet TAKE ONE TABLET TWICE DAILY  . omeprazole (PRILOSEC) 20 MG capsule TAKE ONE CAPSULE BY MOUTH DAILY  . rosuvastatin (CRESTOR) 10 MG tablet Take 1 tablet (10 mg total) by mouth daily.  Marland Kitchen spironolactone (ALDACTONE) 25 MG tablet TAKE ONE (1) TABLET BY MOUTH EVERY DAY   No facility-administered encounter medications on file as of 11/22/2017.     Activities of Daily Living In your present state of health, do you have any difficulty performing the following activities: 11/22/2017  Hearing? N  Vision? N  Difficulty concentrating or making decisions? N  Walking or climbing stairs? Y  Dressing or bathing? N  Doing errands, shopping? N  Preparing Food and eating ? Y  Comment has to sit while cooking   Using the Toilet? N  In the past six months, have you accidently leaked urine? N  Do you have problems with loss of bowel control? N  Managing your Medications? N  Managing your Finances? N  Housekeeping or managing your Housekeeping? Y  Comment has to sit frequently while cleaning  Some recent data might be hidden    Patient Care Team: Fayrene Helper, MD  as PCP - General (Family Medicine) Fay Records, MD as Consulting Physician (Cardiology) Phillips Odor, MD as Consulting Physician (Neurology) Rutherford Guys, MD as Consulting Physician (Ophthalmology)    Assessment:   This is a routine wellness examination for Davanee.  Exercise Activities and Dietary recommendations Current Exercise Habits: The patient does not participate in regular exercise at present  Goals    . Exercise 3x per week (30 min per time)     Recommend starting a  routine exercise program at least 3 days a week for 30-45 minutes at a time as tolerated.         Fall Risk Fall Risk  11/22/2017 08/30/2017 08/24/2017 07/13/2017 03/29/2017  Falls in the past year? Yes Yes No No No  Number falls in past yr: 1 1 - - -  Comment - - - - -  Injury with Fall? - Yes - - -  Comment - hurt her back - - -  Risk for fall due to : - History of fall(s) - - -  Follow up - - - - -   Is the patient's home free of loose throw rugs in walkways, pet beds, electrical cords, etc?   yes      Grab bars in the bathroom? yes      Handrails on the stairs?   yes      Adequate lighting?   yes  Timed Get Up and Go performed:   Depression Screen PHQ 2/9 Scores 11/22/2017 08/30/2017 08/24/2017 07/13/2017  PHQ - 2 Score 2 2 0 0  PHQ- 9 Score 10 3 - -     Cognitive Function     6CIT Screen 11/22/2017 11/16/2016  What Year? 0 points 0 points  What month? 0 points 0 points  What time? 0 points 0 points  Count back from 20 - 0 points  Months in reverse - 0 points  Repeat phrase - 0 points  Total Score - 0    Immunization History  Administered Date(s) Administered  . Influenza,inj,Quad PF,6+ Mos 04/01/2015, 03/16/2016, 01/20/2017  . Pneumococcal Polysaccharide-23 05/13/2015    Qualifies for Shingles Vaccine?will ask insurance   Screening Tests Health Maintenance  Topic Date Due  . Samul Dada  06/07/1976  . INFLUENZA VACCINE  11/11/2017  . URINE MICROALBUMIN  01/20/2018  . HEMOGLOBIN  A1C  05/04/2018  . FOOT EXAM  08/31/2018  . OPHTHALMOLOGY EXAM  10/16/2018  . PAP SMEAR  07/16/2019  . MAMMOGRAM  09/03/2019  . COLONOSCOPY  10/30/2025  . PNEUMOCOCCAL POLYSACCHARIDE VACCINE AGE 29-64 HIGH RISK  Completed  . Hepatitis C Screening  Completed  . HIV Screening  Completed    Cancer Screenings: Lung: Low Dose CT Chest recommended if Age 45-80 years, 30 pack-year currently smoking OR have quit w/in 15years. Patient does not qualify. Breast:  Up to date on Mammogram? Yes   Up to date of Bone Density/Dexa? Yes Colorectal: up to date  Additional Screenings: Hepatitis C Screening:      Plan:      I have personally reviewed and noted the following in the patient's chart:   . Medical and social history . Use of alcohol, tobacco or illicit drugs  . Current medications and supplements . Functional ability and status . Nutritional status . Physical activity . Advanced directives . List of other physicians . Hospitalizations, surgeries, and ER visits in previous 12 months . Vitals . Screenings to include cognitive, depression, and falls . Referrals and appointments  In addition, I have reviewed and discussed with patient certain preventive protocols, quality metrics, and best practice recommendations. A written personalized care plan for preventive services as well as general preventive health recommendations were provided to patient.     Kate Sable, LPN, LPN  12/28/9148

## 2017-11-24 ENCOUNTER — Ambulatory Visit: Payer: Medicare Other | Admitting: Nutrition

## 2017-11-29 ENCOUNTER — Ambulatory Visit (INDEPENDENT_AMBULATORY_CARE_PROVIDER_SITE_OTHER): Payer: Medicare Other | Admitting: "Endocrinology

## 2017-11-29 ENCOUNTER — Encounter: Payer: Self-pay | Admitting: "Endocrinology

## 2017-11-29 VITALS — BP 140/83 | HR 93 | Ht 70.0 in | Wt 381.0 lb

## 2017-11-29 DIAGNOSIS — E782 Mixed hyperlipidemia: Secondary | ICD-10-CM | POA: Diagnosis not present

## 2017-11-29 DIAGNOSIS — I10 Essential (primary) hypertension: Secondary | ICD-10-CM | POA: Diagnosis not present

## 2017-11-29 DIAGNOSIS — E1165 Type 2 diabetes mellitus with hyperglycemia: Secondary | ICD-10-CM | POA: Diagnosis not present

## 2017-11-29 MED ORDER — INSULIN DEGLUDEC 200 UNIT/ML ~~LOC~~ SOPN: 30.0000 [IU] | PEN_INJECTOR | Freq: Every day | SUBCUTANEOUS | 2 refills | Status: DC

## 2017-11-29 NOTE — Patient Instructions (Signed)

## 2017-11-29 NOTE — Progress Notes (Signed)
Endocrinology follow-up note       11/29/2017, 4:54 PM   Subjective:    Patient ID: Tracey [name], female    DOB: [birth year].  Tracey [name] is being seen in follow-up for management of currently uncontrolled symptomatic diabetes requested by  Fayrene Helper, MD.   Past Medical History:  Diagnosis Date  . Anginal pain ([place])   . Arthritis   . Diabetes mellitus without complication ([place])   . Diastolic dysfunction 05/8411   Grade 1  . GERD (gastroesophageal reflux disease)   . Hyperlipidemia   . Hyperlipidemia   . Hypertension 04/15/2012  . Morbid obesity ([place])   . PE (pulmonary embolism)   . Pulmonary emboli ([place]) 1999   post TAH  . Pulmonary embolism ([place])   . Sleep apnea    Past Surgical History:  Procedure Laterality Date  . ABDOMINAL HYSTERECTOMY  1999   , totalfor fibroids  . CESAREAN SECTION    . CHOLECYSTECTOMY  1996  . COLONOSCOPY N/A 10/31/2015   Procedure: COLONOSCOPY;  Surgeon: Rogene Houston, MD;  Location[facility];  Service: Endoscopy;  Laterality: N/A;  930   Social History   Socioeconomic History  . Marital status: Single    Spouse name: Not on file  . Number of children: Not on file  . Years of education: Not on file  . Highest education level: Not on file  Occupational History  . Not on file  Social Needs  . Financial resource strain: Somewhat hard  . Food insecurity:    Worry: Often true    Inability: Often true  . Transportation needs:    Medical: No    Non-medical: No  Tobacco Use  . Smoking status: Never Smoker  . Smokeless tobacco: Never Used  Substance and Sexual Activity  . Alcohol use: No    Alcohol/week: 0.0 standard drinks  . Drug use: No  . Sexual activity: Not Currently  Lifestyle  . Physical activity:    Days per week: 0 days    Minutes per session: 0 min  . Stress: To some extent  Relationships  . Social connections:     Talks on phone: More than three times a week    Gets together: More than three times a week    Attends religious service: Never    Active member of club or organization: No    Attends meetings of clubs or organizations: Never    Relationship status: Divorced  Other Topics Concern  . Not on file  Social History Narrative  . Not on file   Outpatient Encounter Medications as of 11/29/2017  Medication Sig  . ACCU-CHEK SOFTCLIX LANCETS lancets Use as instructed 4x daily dx E11.65  . amLODipine (NORVASC) 2.5 MG tablet TAKE ONE (1) TABLET BY MOUTH EVERY DAY  . aspirin EC 81 MG tablet Take 1 tablet (81 mg total) by mouth daily.  Marland Kitchen glucose blood (ACCU-CHEK AVIVA) test strip Use as instructed 4 x daily dx E11.65  . ibuprofen (ADVIL,MOTRIN) 200 MG tablet Take 200 mg by mouth every 8 (eight) hours as needed for moderate pain.   Marland Kitchen  Insulin Degludec (TRESIBA FLEXTOUCH) 200 UNIT/ML SOPN Inject 30 Units into the skin at bedtime.  . Insulin Pen Needle (B-D ULTRAFINE III SHORT PEN) 31G X 8 MM MISC 1 each by Does not apply route as directed.  . metFORMIN (GLUCOPHAGE) 1000 MG tablet Take 1 tablet (1,000 mg total) by mouth 2 (two) times daily with a meal.  . metoprolol tartrate (LOPRESSOR) 25 MG tablet TAKE ONE TABLET TWICE DAILY  . omeprazole (PRILOSEC) 20 MG capsule TAKE ONE CAPSULE BY MOUTH DAILY  . rosuvastatin (CRESTOR) 10 MG tablet Take 1 tablet (10 mg total) by mouth daily.  Marland Kitchen spironolactone (ALDACTONE) 25 MG tablet TAKE ONE (1) TABLET BY MOUTH EVERY DAY  . UNABLE TO FIND Bedside commode x 1  Dx m76.9, m54.4  . UNABLE TO FIND Shower bench x 1 Shower grab bar x 1  Dx: m76.9, m54.5  . [DISCONTINUED] Insulin Degludec (TRESIBA FLEXTOUCH) 200 UNIT/ML SOPN Inject 20 Units into the skin at bedtime.   No facility-administered encounter medications on file as of 11/29/2017.     ALLERGIES: Allergies  Allergen Reactions  . Haloperidol Lactate Anaphylaxis  . Lipitor [Atorvastatin Calcium] Other (See  Comments)    Markedly elevated liver enzymes    VACCINATION STATUS: Immunization History  Administered Date(s) Administered  . Influenza,inj,Quad PF,6+ Mos 04/01/2015, 03/16/2016, 01/20/2017  . Pneumococcal Polysaccharide-23 05/13/2015    Diabetes  She presents for her follow-up diabetic visit. She has type 2 diabetes mellitus. Onset time: He was diagnosed at approximate age of 62 years. Her disease course has been improving. There are no hypoglycemic associated symptoms. Pertinent negatives for hypoglycemia include no confusion, headaches, pallor or seizures. Associated symptoms include blurred vision, fatigue, polydipsia and polyuria. Pertinent negatives for diabetes include no chest pain and no polyphagia. There are no hypoglycemic complications. Symptoms are improving. There are no diabetic complications. Risk factors for coronary artery disease include diabetes mellitus, dyslipidemia, family history, hypertension, obesity, post-menopausal and sedentary lifestyle. Current diabetic treatment includes oral agent (triple therapy). Her weight is stable. She is following a generally unhealthy diet. When asked about meal planning, she reported none. She has had a previous visit with a dietitian. She never participates in exercise. Her breakfast blood glucose range is generally 180-200 mg/dl. Her lunch blood glucose range is generally 180-200 mg/dl. Her dinner blood glucose range is generally 180-200 mg/dl. Her bedtime blood glucose range is generally 180-200 mg/dl. Her overall blood glucose range is 180-200 mg/dl. (-He returns with improving glycemic profile, still above target.  ) An ACE inhibitor/angiotensin II receptor blocker is not being taken. She does not see a podiatrist.Eye exam is not current.  Hyperlipidemia  This is a chronic problem. The current episode started more than 1 year ago. The problem is uncontrolled. Exacerbating diseases include diabetes and obesity. Pertinent negatives include  no chest pain, myalgias or shortness of breath. Current antihyperlipidemic treatment includes statins. Risk factors for coronary artery disease include diabetes mellitus, dyslipidemia, hypertension, obesity, a sedentary lifestyle and post-menopausal.  Hypertension  Associated symptoms include blurred vision. Pertinent negatives include no chest pain, headaches, palpitations or shortness of breath. Risk factors for coronary artery disease include dyslipidemia, diabetes mellitus, obesity, sedentary lifestyle and post-menopausal state. Past treatments include calcium channel blockers and beta blockers.     Review of Systems  Constitutional: Positive for fatigue. Negative for chills, fever and unexpected weight change.  HENT: Negative for trouble swallowing and voice change.   Eyes: Positive for blurred vision. Negative for visual disturbance.  Respiratory: Negative  for cough, shortness of breath and wheezing.   Cardiovascular: Negative for chest pain, palpitations and leg swelling.  Gastrointestinal: Negative for diarrhea, nausea and vomiting.  Endocrine: Positive for polydipsia and polyuria. Negative for cold intolerance, heat intolerance and polyphagia.  Musculoskeletal: Negative for arthralgias and myalgias.  Skin: Negative for color change, pallor, rash and wound.  Neurological: Negative for seizures and headaches.  Psychiatric/Behavioral: Negative for confusion and suicidal ideas.    Objective:    BP 140/83   Pulse 93   Ht 5\' 10"  (1.778 m)   Wt (!) 381 lb (172.8 kg)   BMI 54.67 kg/m   Wt Readings from Last 3 Encounters:  11/29/17 (!) 381 lb (172.8 kg)  11/22/17 (!) 382 lb (173.3 kg)  11/04/17 (!) 382 lb (173.3 kg)      Physical Exam  Constitutional: She is oriented to person, place, and time. She appears well-developed.  Poor general hygiene.  HENT:  Head: Normocephalic and atraumatic.  Poor dental condition.  Eyes: EOM are normal.  Neck: Normal range of motion. Neck  supple. No tracheal deviation present. No thyromegaly present.  Cardiovascular: Normal rate.  Pulmonary/Chest: Effort normal.  Abdominal: There is no tenderness. There is no guarding.  Musculoskeletal: Normal range of motion. She exhibits no edema.  Neurological: She is alert and oriented to person, place, and time. She has normal reflexes. No cranial nerve deficit. Coordination normal.  Skin: Skin is warm and dry. No rash noted. No erythema. No pallor.  Psychiatric: She has a normal mood and affect. Judgment normal.    CMP ( most recent) CMP     Component Value Date/Time   NA 138 11/01/2017 1617   K 4.2 11/01/2017 1617   CL 102 11/01/2017 1617   CO2 23 11/01/2017 1617   GLUCOSE 254 (H) 11/01/2017 1617   BUN 12 11/01/2017 1617   CREATININE 0.75 11/01/2017 1617   CALCIUM 9.2 11/01/2017 1617   PROT 7.7 11/01/2017 1617   ALBUMIN 3.9 07/13/2016 1430   AST 31 11/01/2017 1617   ALT 32 (H) 11/01/2017 1617   ALKPHOS 61 07/13/2016 1430   BILITOT 0.5 11/01/2017 1617   GFRNONAA 87 11/01/2017 1617   GFRAA 100 11/01/2017 1617     Diabetic Labs (most recent): Lab Results  Component Value Date   HGBA1C 9.3 (H) 11/01/2017   HGBA1C 9.5 (H) 08/04/2017   HGBA1C 8.2 (H) 05/04/2017     Lipid Panel ( most recent) Lipid Panel     Component Value Date/Time   CHOL 240 (H) 08/30/2017 1615   TRIG 124 08/30/2017 1615   HDL 48 (L) 08/30/2017 1615   CHOLHDL 5.0 (H) 08/30/2017 1615   VLDL 23 07/13/2016 1430   LDLCALC 166 (H) 08/30/2017 1615      Lab Results  Component Value Date   TSH 0.95 07/13/2016   TSH 1.26 07/10/2015   TSH 1.382 11/17/2013   TSH 0.519 04/14/2012   TSH 1.525 11/07/2009      Lipid Panel     Component Value Date/Time   CHOL 240 (H) 08/30/2017 1615   TRIG 124 08/30/2017 1615   HDL 48 (L) 08/30/2017 1615   CHOLHDL 5.0 (H) 08/30/2017 1615   VLDL 23 07/13/2016 1430   LDLCALC 166 (H) 08/30/2017 1615     Assessment & Plan:   1. Uncontrolled type 2 diabetes  mellitus with hyperglycemia ([place])  - Lateshia D Drewry has currently uncontrolled symptomatic type 2 DM since 62 years of age. -She returns with her glucose  averaging between 140-200 at fasting.  Her A1c is unchanged at 9.3%.  - Recent labs reviewed, showing normal renal function.  -her diabetes is complicated by obesity/sedentary life and DELONDA COLEY remains at a high risk for more acute and chronic complications which include CAD, CVA, CKD, retinopathy, and neuropathy. These are all discussed in detail with the patient.  - I have counseled her on diet management and weight loss, by adopting a carbohydrate restricted/protein rich diet.  -  Suggestion is made for her to avoid simple carbohydrates  from her diet including Cakes, Sweet Desserts / Pastries, Ice Cream, Soda (diet and regular), Sweet Tea, Candies, Chips, Cookies, Store Bought Juices, Alcohol in Excess of  1-2 drinks a day, Artificial Sweeteners, and "Sugar-free" Products. This will help patient to have stable blood glucose profile and potentially avoid unintended weight gain.  - I encouraged her to switch to  unprocessed or minimally processed complex starch and increased protein intake (animal or plant source), fruits, and vegetables.  - she is advised to stick to a routine mealtimes to eat 3 meals  a day and avoid unnecessary snacks ( to snack only to correct hypoglycemia).   - she has been scheduled with Jearld Fenton, RDN, CDE for individualized diabetes education.  - I have approached her with the following individualized plan to manage diabetes and patient agrees:   - Based on her presentation with improved glycemic profile, she is approached with a higher dose of basal insulin and she accepts.  -I advised her to increase her Tresiba to 30 units nightly associated with strict monitoring of blood glucose 2 times a day.  -Patient is encouraged to call clinic for blood glucose levels less than 70 or above 200 mg  /dl. -In the meantime, I advised her to continue metformin 1000 mg p.o. twice daily, and Januvia 100 mg p.o. daily with breakfast.  After she finishes her Januvia, she will be considered for weekly GLP-1 receptor agonists.   - Patient specific target  A1c;  LDL, HDL, Triglycerides, and  Waist Circumference were discussed in detail.  2) BP/HTN: Her blood pressure is controlled to target.  She is advised to continue her current medications including spironolactone, metoprolol, amlodipine.  3) Lipids/HPL: Her recent lipid panel showed uncontrolled LDL of 166.   She is advised to continue Crestor 10 mg p.o. nightly.  She will need fasting lipid panel on subsequent visits.  4)  Weight/Diet: CDE Consult has been initiated , exercise, and detailed carbohydrates information provided.  She is a good candidate for bariatric surgery, briefly discussed with her.  5) Chronic Care/Health Maintenance:  -she  Is on Statin medications and  is encouraged to continue to follow up with Ophthalmology, Dentist,  Podiatrist at least yearly or according to recommendations, and advised to   stay away from smoking. I have recommended yearly flu vaccine and pneumonia vaccination at least every 5 years; moderate intensity exercise for up to 150 minutes weekly; and  sleep for at least 7 hours a day.  - I advised patient to maintain close follow up with Fayrene Helper, MD for primary care needs.  - Time spent with the patient: 25 min, of which >50% was spent in reviewing her blood glucose logs , discussing her hypo- and hyper-glycemic episodes, reviewing her current and  previous labs and insulin doses and developing a plan to avoid hypo- and hyper-glycemia. Please refer to Patient Instructions for Blood Glucose Monitoring and Insulin/Medications Dosing Guide"  in media  tab for additional information. Duane Boston participated in the discussions, expressed understanding, and voiced agreement with the above plans.   All questions were answered to her satisfaction. she is encouraged to contact clinic should she have any questions or concerns prior to her return visit.   Follow up plan: - Return in about 3 months (around 03/01/2018) for Follow up with Pre-visit Labs, Meter, and Logs.  Glade Lloyd, MD [facility] [address] [place], [place] [address] Phone: [phone]  Fax: [phone]    11/29/2017, 4:54 PM  This note was partially dictated with voice recognition software. Similar sounding words can be transcribed inadequately or may not  be corrected upon review.

## 2017-12-14 ENCOUNTER — Encounter: Payer: Self-pay | Admitting: Nutrition

## 2017-12-14 ENCOUNTER — Encounter: Payer: Medicare Other | Attending: Family Medicine | Admitting: Nutrition

## 2017-12-14 VITALS — Ht 70.0 in | Wt 384.0 lb

## 2017-12-14 DIAGNOSIS — Z6841 Body Mass Index (BMI) 40.0 and over, adult: Secondary | ICD-10-CM | POA: Diagnosis not present

## 2017-12-14 DIAGNOSIS — Z713 Dietary counseling and surveillance: Secondary | ICD-10-CM | POA: Insufficient documentation

## 2017-12-14 DIAGNOSIS — E669 Obesity, unspecified: Secondary | ICD-10-CM | POA: Insufficient documentation

## 2017-12-14 DIAGNOSIS — E118 Type 2 diabetes mellitus with unspecified complications: Secondary | ICD-10-CM

## 2017-12-14 DIAGNOSIS — E1165 Type 2 diabetes mellitus with hyperglycemia: Secondary | ICD-10-CM

## 2017-12-14 DIAGNOSIS — E119 Type 2 diabetes mellitus without complications: Secondary | ICD-10-CM | POA: Insufficient documentation

## 2017-12-14 DIAGNOSIS — IMO0002 Reserved for concepts with insufficient information to code with codable children: Secondary | ICD-10-CM

## 2017-12-14 NOTE — Progress Notes (Signed)
  Diabetes Self-Management Education  Visit Type:    f/u  Appt. Start Time: 1445 Appt. End Time: 7902 12/14/2017  Tracey [name], identified by name and date of birth, is a 62 y.o. female with a diagnosis of Diabetes Type 2:  . 7 day avg  141 IO/XB35 day 138 and 30 day 157 mg/dl Currently taking 30 units of Tresiba per Dr.  Dorris Fetch, Endocrinologist.. Drinking more water and eating more vegetables and baked and broiled meats. Eating more fruit. Has cut sweets, and junk food. Walking some. Cares for her disabled son. Gained 2 lbs but BS are better overall. Willing to increase exercise and drink more water. Sees Dr. Moshe Cipro in  October and will get labs done again.  She refused to consider bariatric surgery as she notes she had surgery in past and had blood clot and fears another blood clot.   ASSESSMENT  Height 5\' 10"  (1.778 m), weight (!) 384 lb (174.2 kg). Body mass index is 55.1 kg/m.    B) Honey and oats 1 cup with skim milk 2 pm: Toss salad with vinegar - lettuce, tomatoes, cukes, onions, LF cheese, peppers,  Water 7 pm:  Tomato and ham sandwich on wheat bread,  Water  Lab Results  Component Value Date   HGBA1C 9.3 (H) 11/01/2017   CMP Latest Ref Rng & Units 11/01/2017 10/22/2017 08/04/2017  Glucose 65 - 139 mg/dL 254(H) - 274(H)  BUN 7 - 25 mg/dL 12 - 12  Creatinine 0.50 - 0.99 mg/dL 0.75 - 0.88  Sodium 135 - 146 mmol/L 138 - 133(L)  Potassium 3.5 - 5.3 mmol/L 4.2 - 4.5  Chloride 98 - 110 mmol/L 102 - 99  CO2 20 - 32 mmol/L 23 - 27  Calcium 8.6 - 10.4 mg/dL 9.2 - 9.0  Total Protein 6.1 - 8.1 g/dL 7.7 7.6 7.5  Total Bilirubin 0.2 - 1.2 mg/dL 0.5 0.7 0.4  Alkaline Phos 33 - 130 U/L - - -  AST 10 - 35 U/L 31 23 24   ALT 6 - 29 U/L 32(H) 26 27     Learning Objective:  Patient will have a greater understanding of diabetes self-management. Patient education plan is to attend individual and/or group sessions per assessed needs and concerns.  Plan: Goals 1. Increase  water to 5 bottles per day. 2. Add to eggs to breakfast 3. Walk 15 minutes a day 4. Lose 1 lb per week. Get A1C down to 7%. .    Expected Outcomes:    Improved self management of her DM.  Education material provided: My Plate and Carbohydrate counting sheet  If problems or questions, patient to contact team via:  Phone and Email  Future DSME appointment: -   f/u 2 months.

## 2017-12-14 NOTE — Patient Instructions (Addendum)
Goals 1. Increase water to 5 bottles per day. 2. Add to eggs to breakfast 3. Walk 15 minutes a day 4. Lose 1 lb per week. Get A1C down to 7%.

## 2017-12-20 ENCOUNTER — Other Ambulatory Visit: Payer: Self-pay | Admitting: "Endocrinology

## 2017-12-20 ENCOUNTER — Other Ambulatory Visit: Payer: Self-pay | Admitting: Family Medicine

## 2018-01-14 ENCOUNTER — Ambulatory Visit: Payer: Medicare Other | Admitting: Podiatry

## 2018-01-27 ENCOUNTER — Telehealth: Payer: Self-pay

## 2018-01-27 DIAGNOSIS — E559 Vitamin D deficiency, unspecified: Secondary | ICD-10-CM | POA: Diagnosis not present

## 2018-01-27 DIAGNOSIS — E1169 Type 2 diabetes mellitus with other specified complication: Secondary | ICD-10-CM

## 2018-01-27 DIAGNOSIS — I10 Essential (primary) hypertension: Secondary | ICD-10-CM | POA: Diagnosis not present

## 2018-01-27 DIAGNOSIS — E782 Mixed hyperlipidemia: Secondary | ICD-10-CM

## 2018-01-27 DIAGNOSIS — E669 Obesity, unspecified: Secondary | ICD-10-CM

## 2018-01-27 DIAGNOSIS — E1165 Type 2 diabetes mellitus with hyperglycemia: Secondary | ICD-10-CM

## 2018-01-27 NOTE — Telephone Encounter (Signed)
Pt came in office requesting lab order for her visit on 10/21

## 2018-01-28 LAB — COMPLETE METABOLIC PANEL WITH GFR
AG RATIO: 1.1 (calc) (ref 1.0–2.5)
ALBUMIN MSPROF: 4 g/dL (ref 3.6–5.1)
ALKALINE PHOSPHATASE (APISO): 74 U/L (ref 33–130)
ALT: 25 U/L (ref 6–29)
AST: 27 U/L (ref 10–35)
BILIRUBIN TOTAL: 0.6 mg/dL (ref 0.2–1.2)
BUN: 8 mg/dL (ref 7–25)
CHLORIDE: 100 mmol/L (ref 98–110)
CO2: 24 mmol/L (ref 20–32)
Calcium: 9 mg/dL (ref 8.6–10.4)
Creat: 0.8 mg/dL (ref 0.50–0.99)
GFR, EST AFRICAN AMERICAN: 93 mL/min/{1.73_m2} (ref >=60)
GFR, Est Non African American: 80 mL/min/{1.73_m2} (ref >=60)
Globulin: 3.6 g/dL (calc) (ref 1.9–3.7)
Glucose, Bld: 222 mg/dL — ABNORMAL HIGH (ref 65–99)
POTASSIUM: 4.4 mmol/L (ref 3.5–5.3)
Sodium: 136 mmol/L (ref 135–146)
TOTAL PROTEIN: 7.6 g/dL (ref 6.1–8.1)

## 2018-01-28 LAB — LIPID PANEL
CHOLESTEROL: 183 mg/dL (ref <200)
HDL: 40 mg/dL — ABNORMAL LOW (ref >50)
LDL CHOLESTEROL (CALC): 121 mg/dL — AB
Non-HDL Cholesterol (Calc): 143 mg/dL (calc) — ABNORMAL HIGH (ref <130)
Total CHOL/HDL Ratio: 4.6 (calc) (ref <5.0)
Triglycerides: 108 mg/dL (ref <150)

## 2018-01-28 LAB — VITAMIN D 25 HYDROXY (VIT D DEFICIENCY, FRACTURES): VIT D 25 HYDROXY: 10 ng/mL — AB (ref 30–100)

## 2018-01-31 ENCOUNTER — Other Ambulatory Visit: Payer: Self-pay | Admitting: Family Medicine

## 2018-01-31 ENCOUNTER — Ambulatory Visit: Payer: Medicare Other | Admitting: Family Medicine

## 2018-01-31 MED ORDER — ERGOCALCIFEROL 1.25 MG (50000 UT) PO CAPS: 50000.0000 [IU] | ORAL_CAPSULE | ORAL | 1 refills | Status: DC

## 2018-02-07 ENCOUNTER — Ambulatory Visit: Payer: Medicare Other | Admitting: Podiatry

## 2018-02-11 ENCOUNTER — Ambulatory Visit (INDEPENDENT_AMBULATORY_CARE_PROVIDER_SITE_OTHER): Payer: Medicare Other | Admitting: Podiatry

## 2018-02-11 DIAGNOSIS — M79674 Pain in right toe(s): Secondary | ICD-10-CM

## 2018-02-11 DIAGNOSIS — B351 Tinea unguium: Secondary | ICD-10-CM

## 2018-02-11 DIAGNOSIS — M79675 Pain in left toe(s): Secondary | ICD-10-CM

## 2018-02-11 NOTE — Patient Instructions (Signed)

## 2018-02-22 ENCOUNTER — Ambulatory Visit: Payer: Medicare Other | Admitting: Family Medicine

## 2018-03-02 ENCOUNTER — Ambulatory Visit: Payer: Medicare Other | Admitting: "Endocrinology

## 2018-03-02 ENCOUNTER — Ambulatory Visit: Payer: Medicare Other | Admitting: Nutrition

## 2018-03-05 ENCOUNTER — Encounter: Payer: Self-pay | Admitting: Podiatry

## 2018-03-05 NOTE — Progress Notes (Addendum)
Subjective: Tracey [name] presents today with chief complaint of painful, discolored, thick toenails 1 through 5 bilateral feet.  Duration greater than 1 month.  Pain interferes with activities of daily living  and is is aggravated when wearing enclosed shoe gear.   Today Tracey [name] relates she has had the same at the medial border of her right great toe.  She believes the nail has grown in.  She denies any drainage or redness to the area.  It is painful when she wears shoes.  Objective: Neurovascular status is unchanged.  Capillary refill time less than 3 seconds all 10 digits.  Dorsalis pedis and posterior tibial pulses are palpable bilaterally.  There is no digital hair x10 digits.  Skin temperature gradient within normal limits bilaterally.  Sensation is diminished with 10 g monofilament.  Dermatological Examination: Reveals skin with normal turgor texture and tone bilaterally.  Toenails 1 through 5 bilaterally are discolored, thick, dystrophic with subungual debris and pain on palpation to the nailbeds.  Right great toe noted to be incurvated medial border with tenderness to palpation.  There is no redness no drainage no edema of the area noted.  There is no purulence expressed.  Musculoskeletal: Muscle strength 5/5 to all LE muscle groups  Neurological: Sensation diminished with 10 gram monofilament. Vibratory sensation diminished  Assessment: 1. Painful onychomycosis toenails 1-5 b/l 2.   NIDDM with Diabetic neuropathy 3.  Painful ingrown toenail medial border right great toe  Plan: 1. Continue diabetic foot care principles.  2. Toenails 1-5 b/l were debrided in length and girth without iatrogenic bleeding.  Offending nail border, medial border of the right great toe was debrided and curettaged without complication.  The border was cleansed with alcohol.  Antibiotic ointment triple antibiotic ointment and a Band-Aid were applied.  Tracey [name] was advised to apply antibiotic  ointment to her right great toe once a day for 1 week.  She related understanding. 3.  Patient to continue soft, supportive shoe gear. 4. Patient to report any pedal injuries to medical professional  5. Follow up 3 months. Patient/POA to call should there be a concern in the interim.

## 2018-03-08 ENCOUNTER — Other Ambulatory Visit: Payer: Self-pay | Admitting: Family Medicine

## 2018-03-08 ENCOUNTER — Other Ambulatory Visit: Payer: Self-pay | Admitting: Internal Medicine

## 2018-03-21 ENCOUNTER — Encounter: Payer: Medicare Other | Attending: Family Medicine | Admitting: Nutrition

## 2018-03-21 ENCOUNTER — Encounter: Payer: Self-pay | Admitting: Nutrition

## 2018-03-21 VITALS — Ht 70.0 in | Wt 387.0 lb

## 2018-03-21 DIAGNOSIS — E118 Type 2 diabetes mellitus with unspecified complications: Secondary | ICD-10-CM | POA: Insufficient documentation

## 2018-03-21 DIAGNOSIS — E1165 Type 2 diabetes mellitus with hyperglycemia: Secondary | ICD-10-CM | POA: Diagnosis not present

## 2018-03-21 DIAGNOSIS — IMO0002 Reserved for concepts with insufficient information to code with codable children: Secondary | ICD-10-CM

## 2018-03-21 NOTE — Progress Notes (Signed)
   Nutrition Therapy  Visit Type:    f/u  Appt. Start Time: 1130 Appt. End Time: 7371 03/21/2018  [name], identified by name and date of birth, is a 62 y.o. female with a diagnosis of Diabetes Type 2:  . Bs have been running 200-300's. Gained 4 lbs since last visit. Fell after her knee gave out.  Hasn't been exercising. Skips meals. Usually eats 1-2 meals per day. Tresiba 30 units 1000 mg BID Metformin and Januvia. Forgets insulin 1-2 times a week. Lacks commitment to make necessary changes with diet and lifestyle for needed weight loss and improve glycemia. Scared of gastric bypass. Sees DR. Nida next week.   ASSESSMENT  Height 5\' 10"  (1.778 m), weight (!) 387 lb (175.5 kg). Body mass index is 55.53 kg/m.      Lab Results  Component Value Date   HGBA1C 9.3 (H) 11/01/2017   CMP Latest Ref Rng & Units 01/27/2018 11/01/2017 10/22/2017  Glucose 65 - 99 mg/dL 222(H) 254(H) -  BUN 7 - 25 mg/dL 8 12 -  Creatinine 0.50 - 0.99 mg/dL 0.80 0.75 -  Sodium 135 - 146 mmol/L 136 138 -  Potassium 3.5 - 5.3 mmol/L 4.4 4.2 -  Chloride 98 - 110 mmol/L 100 102 -  CO2 20 - 32 mmol/L 24 23 -  Calcium 8.6 - 10.4 mg/dL 9.0 9.2 -  Total Protein 6.1 - 8.1 g/dL 7.6 7.7 7.6  Total Bilirubin 0.2 - 1.2 mg/dL 0.6 0.5 0.7  Alkaline Phos 33 - 130 U/L - - -  AST 10 - 35 U/L 27 31 23   ALT 6 - 29 U/L 25 32(H) 26     Learning Objective:  Patient will have a greater understanding of diabetes self-management. Patient education plan is to attend individual and/or group sessions per assessed needs and concerns.   .Goal 1. Eat breakfast daily.  Don't skip meals [place] try  egg, and oatmeal Lunch; Meat and more lower carb vegetable.  Drink more water  Get BS les than 150 in am Don't past 7 pm at night.    Expected Outcomes:    Improved self management of her DM.  Education material provided: My Plate and Carbohydrate counting sheet  If problems or questions, patient to contact team  via:  Phone and Email  Future DSME appointment: -   f/u 2 months.

## 2018-03-21 NOTE — Patient Instructions (Signed)
Goal 1. Eat breakfast daily.  Don't skip meals [place] egg, and oatmeal Lunch; Meat and more lower carb vegetable.  Drink more water  Get BS les than 150 in am Don't past 7 pm at night.

## 2018-03-23 ENCOUNTER — Other Ambulatory Visit: Payer: Self-pay | Admitting: "Endocrinology

## 2018-03-23 DIAGNOSIS — E1165 Type 2 diabetes mellitus with hyperglycemia: Secondary | ICD-10-CM | POA: Diagnosis not present

## 2018-03-24 LAB — HEMOGLOBIN A1C
EAG (MMOL/L): 14.1 (calc)
Hgb A1c MFr Bld: 10.5 % of total Hgb — ABNORMAL HIGH (ref <5.7)
Mean Plasma Glucose: 255 (calc)

## 2018-03-29 ENCOUNTER — Encounter: Payer: Self-pay | Admitting: "Endocrinology

## 2018-03-29 ENCOUNTER — Ambulatory Visit (INDEPENDENT_AMBULATORY_CARE_PROVIDER_SITE_OTHER): Payer: Medicare Other | Admitting: "Endocrinology

## 2018-03-29 VITALS — BP 145/88 | HR 99 | Ht 70.0 in | Wt 384.0 lb

## 2018-03-29 DIAGNOSIS — I10 Essential (primary) hypertension: Secondary | ICD-10-CM | POA: Diagnosis not present

## 2018-03-29 DIAGNOSIS — E782 Mixed hyperlipidemia: Secondary | ICD-10-CM | POA: Diagnosis not present

## 2018-03-29 DIAGNOSIS — E1165 Type 2 diabetes mellitus with hyperglycemia: Secondary | ICD-10-CM | POA: Diagnosis not present

## 2018-03-29 MED ORDER — INSULIN DEGLUDEC 200 UNIT/ML ~~LOC~~ SOPN: 50.0000 [IU] | PEN_INJECTOR | Freq: Every day | SUBCUTANEOUS | 2 refills | Status: DC

## 2018-03-29 MED ORDER — ROSUVASTATIN CALCIUM 20 MG PO TABS: 20.0000 mg | ORAL_TABLET | Freq: Every day | ORAL | 3 refills | Status: DC

## 2018-03-29 NOTE — Progress Notes (Signed)
Endocrinology follow-up note       03/29/2018, 2:54 PM   Subjective:    Patient ID: Tracey [name], female    DOB: 1955-08-16.  Tracey [name] is being seen in follow-up for management of currently uncontrolled symptomatic type 2 diabetes, hyperlipidemia, hypertension. PMD:   Fayrene Helper, MD.  Past Medical History:  Diagnosis Date  . Anginal pain ([place])   . Arthritis   . Diabetes mellitus without complication ([place])   . Diastolic dysfunction 10/6806   Grade 1  . GERD (gastroesophageal reflux disease)   . Hyperlipidemia   . Hyperlipidemia   . Hypertension 04/15/2012  . Morbid obesity ([place])   . PE (pulmonary embolism)   . Pulmonary emboli ([place]) 1999   post TAH  . Pulmonary embolism ([place])   . Sleep apnea    Past Surgical History:  Procedure Laterality Date  . ABDOMINAL HYSTERECTOMY  1999   , totalfor fibroids  . CESAREAN SECTION    . CHOLECYSTECTOMY  1996  . COLONOSCOPY N/A 10/31/2015   Procedure: COLONOSCOPY;  Surgeon: Rogene Houston, MD;  Location: [facility];  Service: Endoscopy;  Laterality: N/A;  930   Social History   Socioeconomic History  . Marital status: Single    Spouse name: Not on file  . Number of children: Not on file  . Years of education: Not on file  . Highest education level: Not on file  Occupational History  . Not on file  Social Needs  . Financial resource strain: Somewhat hard  . Food insecurity:    Worry: Often true    Inability: Often true  . Transportation needs:    Medical: No    Non-medical: No  Tobacco Use  . Smoking status: Never Smoker  . Smokeless tobacco: Never Used  Substance and Sexual Activity  . Alcohol use: No    Alcohol/week: 0.0 standard drinks  . Drug use: No  . Sexual activity: Not Currently  Lifestyle  . Physical activity:    Days per week: 0 days    Minutes per session: 0 min  . Stress: To some extent   Relationships  . Social connections:    Talks on phone: More than three times a week    Gets together: More than three times a week    Attends religious service: Never    Active member of club or organization: No    Attends meetings of clubs or organizations: Never    Relationship status: Divorced  Other Topics Concern  . Not on file  Social History Narrative  . Not on file   Outpatient Encounter Medications as of 03/29/2018  Medication Sig  . ACCU-CHEK SOFTCLIX LANCETS lancets Use as instructed 4x daily dx E11.65  . amLODipine (NORVASC) 2.5 MG tablet TAKE ONE (1) TABLET BY MOUTH EVERY DAY  . aspirin EC 81 MG tablet Take 1 tablet (81 mg total) by mouth daily.  . ergocalciferol (VITAMIN D2) 50000 units capsule Take 1 capsule (50,000 Units total) by mouth once a week. One capsule once weekly  . glucose blood (ACCU-CHEK AVIVA) test strip Use as instructed 4 x daily  dx E11.65  . ibuprofen (ADVIL,MOTRIN) 200 MG tablet Take 200 mg by mouth every 8 (eight) hours as needed for moderate pain.   . Insulin Degludec (TRESIBA FLEXTOUCH) 200 UNIT/ML SOPN Inject 50 Units into the skin at bedtime.  . Insulin Pen Needle (B-D ULTRAFINE III SHORT PEN) 31G X 8 MM MISC 1 each by Does not apply route as directed.  . metFORMIN (GLUCOPHAGE) 1000 MG tablet Take 1 tablet (1,000 mg total) by mouth 2 (two) times daily with a meal.  . metoprolol tartrate (LOPRESSOR) 25 MG tablet TAKE ONE TABLET TWICE DAILY.  Marland Kitchen omeprazole (PRILOSEC) 20 MG capsule TAKE ONE CAPSULE BY MOUTH DAILY  . rosuvastatin (CRESTOR) 20 MG tablet Take 1 tablet (20 mg total) by mouth daily.  Marland Kitchen spironolactone (ALDACTONE) 25 MG tablet TAKE ONE (1) TABLET BY MOUTH EVERY DAY  . UNABLE TO FIND Bedside commode x 1  Dx m76.9, m54.4  . UNABLE TO FIND Shower bench x 1 Shower grab bar x 1  Dx: m76.9, m54.5  . [DISCONTINUED] Insulin Degludec (TRESIBA FLEXTOUCH) 200 UNIT/ML SOPN Inject 30 Units into the skin at bedtime.  . [DISCONTINUED] JANUVIA 100 MG  tablet TAKE ONE TABLET BY MOUTH EACH DAY. DISCONTINUE JANUMET.  . [DISCONTINUED] rosuvastatin (CRESTOR) 10 MG tablet Take 1 tablet (10 mg total) by mouth daily.   No facility-administered encounter medications on file as of 03/29/2018.     ALLERGIES: Allergies  Allergen Reactions  . Haloperidol Lactate Anaphylaxis  . Lipitor [Atorvastatin Calcium] Other (See Comments)    Markedly elevated liver enzymes    VACCINATION STATUS: Immunization History  Administered Date(s) Administered  . Influenza,inj,Quad PF,6+ Mos 04/01/2015, 03/16/2016, 01/20/2017  . Pneumococcal Polysaccharide-23 05/13/2015    Diabetes  She presents for her follow-up diabetic visit. She has type 2 diabetes mellitus. Onset time: He was diagnosed at approximate age of 42 years. Her disease course has been improving. There are no hypoglycemic associated symptoms. Pertinent negatives for hypoglycemia include no confusion, headaches, pallor or seizures. Associated symptoms include blurred vision, fatigue, polydipsia and polyuria. Pertinent negatives for diabetes include no chest pain and no polyphagia. There are no hypoglycemic complications. Symptoms are improving. There are no diabetic complications. Risk factors for coronary artery disease include diabetes mellitus, dyslipidemia, family history, hypertension, obesity, post-menopausal and sedentary lifestyle. Current diabetic treatment includes oral agent (triple therapy). Her weight is stable. She is following a generally unhealthy diet. When asked about meal planning, she reported none. She has had a previous visit with a dietitian. She never participates in exercise. Her breakfast blood glucose range is generally 180-200 mg/dl. Her lunch blood glucose range is generally 180-200 mg/dl. Her dinner blood glucose range is generally 180-200 mg/dl. Her bedtime blood glucose range is generally 180-200 mg/dl. Her overall blood glucose range is 180-200 mg/dl. (-He returns with improving  glycemic profile, still above target.  ) An ACE inhibitor/angiotensin II receptor blocker is not being taken. She does not see a podiatrist.Eye exam is not current.  Hyperlipidemia  This is a chronic problem. The current episode started more than 1 year ago. The problem is uncontrolled. Exacerbating diseases include diabetes and obesity. Pertinent negatives include no chest pain, myalgias or shortness of breath. Current antihyperlipidemic treatment includes statins. Risk factors for coronary artery disease include diabetes mellitus, dyslipidemia, hypertension, obesity, a sedentary lifestyle and post-menopausal.  Hypertension  Associated symptoms include blurred vision. Pertinent negatives include no chest pain, headaches, palpitations or shortness of breath. Risk factors for coronary artery disease include dyslipidemia, diabetes  mellitus, obesity, sedentary lifestyle and post-menopausal state. Past treatments include calcium channel blockers and beta blockers.     Review of Systems  Constitutional: Positive for fatigue. Negative for chills, fever and unexpected weight change.  HENT: Negative for trouble swallowing and voice change.   Eyes: Positive for blurred vision. Negative for visual disturbance.  Respiratory: Negative for cough, shortness of breath and wheezing.   Cardiovascular: Negative for chest pain, palpitations and leg swelling.  Gastrointestinal: Negative for diarrhea, nausea and vomiting.  Endocrine: Positive for polydipsia and polyuria. Negative for cold intolerance, heat intolerance and polyphagia.  Musculoskeletal: Negative for arthralgias and myalgias.  Skin: Negative for color change, pallor, rash and wound.  Neurological: Negative for seizures and headaches.  Psychiatric/Behavioral: Negative for confusion and suicidal ideas.    Objective:    BP (!) 145/88   Pulse 99   Ht 5\' 10"  (1.778 m)   Wt (!) 384 lb (174.2 kg)   BMI 55.10 kg/m   Wt Readings from Last 3 Encounters:   03/29/18 (!) 384 lb (174.2 kg)  03/21/18 (!) 387 lb (175.5 kg)  12/14/17 (!) 384 lb (174.2 kg)      Physical Exam Constitutional:      Appearance: She is well-developed.     Comments: Poor general hygiene.  HENT:     Head: Normocephalic and atraumatic.  Neck:     Musculoskeletal: Normal range of motion and neck supple.     Thyroid: No thyromegaly.     Trachea: No tracheal deviation.  Cardiovascular:     Rate and Rhythm: Normal rate.  Pulmonary:     Effort: Pulmonary effort is normal.  Abdominal:     Tenderness: There is no abdominal tenderness. There is no guarding.  Musculoskeletal: Normal range of motion.  Skin:    General: Skin is warm and dry.     Coloration: Skin is not pale.     Findings: No erythema or rash.  Neurological:     Mental Status: She is alert and oriented to person, place, and time.     Cranial Nerves: No cranial nerve deficit.     Coordination: Coordination normal.     Deep Tendon Reflexes: Reflexes are normal and symmetric.  Psychiatric:        Judgment: Judgment normal.     CMP     Component Value Date/Time   NA 136 01/27/2018 1445   K 4.4 01/27/2018 1445   CL 100 01/27/2018 1445   CO2 24 01/27/2018 1445   GLUCOSE 222 (H) 01/27/2018 1445   BUN 8 01/27/2018 1445   CREATININE 0.80 01/27/2018 1445   CALCIUM 9.0 01/27/2018 1445   PROT 7.6 01/27/2018 1445   ALBUMIN 3.9 07/13/2016 1430   AST 27 01/27/2018 1445   ALT 25 01/27/2018 1445   ALKPHOS 61 07/13/2016 1430   BILITOT 0.6 01/27/2018 1445   GFRNONAA 80 01/27/2018 1445   GFRAA 93 01/27/2018 1445     Diabetic Labs (most recent): Lab Results  Component Value Date   HGBA1C 10.5 (H) 03/23/2018   HGBA1C 9.3 (H) 11/01/2017   HGBA1C 9.5 (H) 08/04/2017     Lipid Panel ( most recent) Lipid Panel     Component Value Date/Time   CHOL 183 01/27/2018 1445   TRIG 108 01/27/2018 1445   HDL 40 (L) 01/27/2018 1445   CHOLHDL 4.6 01/27/2018 1445   VLDL 23 07/13/2016 1430   LDLCALC 121 (H)  01/27/2018 1445      Lab Results  Component Value  Date   TSH 0.95 07/13/2016   TSH 1.26 07/10/2015   TSH 1.382 11/17/2013   TSH 0.519 04/14/2012   TSH 1.525 11/07/2009      Lipid Panel     Component Value Date/Time   CHOL 183 01/27/2018 1445   TRIG 108 01/27/2018 1445   HDL 40 (L) 01/27/2018 1445   CHOLHDL 4.6 01/27/2018 1445   VLDL 23 07/13/2016 1430   LDLCALC 121 (H) 01/27/2018 1445     Assessment & Plan:   1. Uncontrolled type 2 diabetes mellitus with hyperglycemia ([place])  - Tracey [name] has currently uncontrolled symptomatic type 2 DM since 62 years of age. -She returns with her above target glucose of 5 and A1c 10.5% increasing from 9.3%.    - Recent labs reviewed, showing normal renal function.  -her diabetes is complicated by obesity/sedentary life and Tracey [name] remains at a high risk for more acute and chronic complications which include CAD, CVA, CKD, retinopathy, and neuropathy. These are all discussed in detail with the patient.  - I have counseled her on diet management and weight loss, by adopting a carbohydrate restricted/protein rich diet.  -  Suggestion is made for her to avoid simple carbohydrates  from her diet including Cakes, Sweet Desserts / Pastries, Ice Cream, Soda (diet and regular), Sweet Tea, Candies, Chips, Cookies, Store Bought Juices, Alcohol in Excess of  1-2 drinks a day, Artificial Sweeteners, and "Sugar-free" Products. This will help patient to have stable blood glucose profile and potentially avoid unintended weight gain.   - I encouraged her to switch to  unprocessed or minimally processed complex starch and increased protein intake (animal or plant source), fruits, and vegetables.  - she is advised to stick to a routine mealtimes to eat 3 meals  a day and avoid unnecessary snacks ( to snack only to correct hypoglycemia).   - she is following with  Jearld Fenton, RDN, CDE for individualized diabetes education.  - I have  approached her with the following individualized plan to manage diabetes and patient agrees:   - Based on her presentation with significantly above target glycemic profile, she is approached for a higher dose of insulin.    -She is advised to increase her Tresiba to 50 units nightly associated with strict monitoring of blood glucose 2 times a day-daily before breakfast and at bedtime.  -She may require prandial insulin if A1c remains above 9% on subsequent visit.   -Patient is encouraged to call clinic for blood glucose levels less than 70 or above 200 mg /dl. -She is advised to continue metformin 1000 mg p.o. twice daily, and Januvia 100 mg p.o. daily with breakfast.  After she finishes her Januvia, she will be considered for weekly GLP-1 receptor agonists.   - Patient specific target  A1c;  LDL, HDL, Triglycerides, and  Waist Circumference were discussed in detail.  2) BP/HTN: Her blood pressure is not controlled to target.   She is advised to continue her current medications including spironolactone, metoprolol, amlodipine.  3) Lipids/HPL: Her recent lipid panel showed uncontrolled LDL of 166.   She is sized to continue Crestor 10 mg p.o. nightly.   She will need fasting lipid panel on subsequent visits.  4)  Weight/Diet: CDE Consult has been initiated , exercise, and detailed carbohydrates information provided.  She is a good candidate for bariatric surgery, briefly discussed with her.  5) Chronic Care/Health Maintenance:  -she  Is on Statin medications and  is encouraged to  continue to follow up with Ophthalmology, Dentist,  Podiatrist at least yearly or according to recommendations, and advised to   stay away from smoking. I have recommended yearly flu vaccine and pneumonia vaccination at least every 5 years; moderate intensity exercise for up to 150 minutes weekly; and  sleep for at least 7 hours a day.  - I advised patient to maintain close follow up with Fayrene Helper, MD for  primary care needs.  - Time spent with the patient: 25 min, of which >50% was spent in reviewing her blood glucose logs , discussing her hypo- and hyper-glycemic episodes, reviewing her current and  previous labs and insulin doses and developing a plan to avoid hypo- and hyper-glycemia. Please refer to Patient Instructions for Blood Glucose Monitoring and Insulin/Medications Dosing Guide"  in media tab for additional information. Duane Boston participated in the discussions, expressed understanding, and voiced agreement with the above plans.  All questions were answered to her satisfaction. she is encouraged to contact clinic should she have any questions or concerns prior to her return visit.  Follow up plan: - Return in about 4 months (around 07/29/2018) for Follow up with Pre-visit Labs, Meter, and Logs.  Glade Lloyd, MD [facility] [address] [place], [place] [address] Phone: [phone]  Fax: [phone]    03/29/2018, 2:54 PM  This note was partially dictated with voice recognition software. Similar sounding words can be transcribed inadequately or may not  be corrected upon review.

## 2018-03-29 NOTE — Patient Instructions (Signed)

## 2018-04-11 ENCOUNTER — Ambulatory Visit (INDEPENDENT_AMBULATORY_CARE_PROVIDER_SITE_OTHER): Payer: Medicare Other | Admitting: Family Medicine

## 2018-04-11 ENCOUNTER — Encounter: Payer: Self-pay | Admitting: Family Medicine

## 2018-04-11 VITALS — BP 120/78 | HR 100 | Resp 14 | Ht 70.0 in | Wt 381.1 lb

## 2018-04-11 DIAGNOSIS — I10 Essential (primary) hypertension: Secondary | ICD-10-CM

## 2018-04-11 DIAGNOSIS — E785 Hyperlipidemia, unspecified: Secondary | ICD-10-CM | POA: Diagnosis not present

## 2018-04-11 DIAGNOSIS — Z23 Encounter for immunization: Secondary | ICD-10-CM | POA: Diagnosis not present

## 2018-04-11 DIAGNOSIS — E1165 Type 2 diabetes mellitus with hyperglycemia: Secondary | ICD-10-CM

## 2018-04-11 NOTE — Assessment & Plan Note (Signed)
Unchanged Patient re-educated about  the importance of commitment to a  minimum of 150 minutes of exercise per week.  The importance of healthy food choices with portion control discussed. Encouraged to start a food diary, count calories and to consider  joining a support group. Sample diet sheets offered. Goals set by the patient for the next several months.   Weight /BMI 04/11/2018 03/29/2018 03/21/2018  WEIGHT 381 lb 1.3 oz 384 lb 387 lb  HEIGHT 5\' 10"  5\' 10"  5\' 10"   BMI 54.68 kg/m2 55.1 kg/m2 55.53 kg/m2

## 2018-04-11 NOTE — Progress Notes (Signed)
Tracey [name]     MRN: [id number]      DOB: 04-18-1955   HPI Tracey [name] is here for follow up and re-evaluation of chronic medical conditions, medication management and review of any available recent lab and radiology data.  Preventive health is updated, specifically  Cancer screening and Immunization.   Questions or concerns regarding consultations or procedures which the PT has had in the interim are  Addressed.Reports that she still has fluctuating blood sugars, eats and drinks sugar in excess The PT denies any adverse reactions to current medications since the last visit.  There are no new concerns.  There are no specific complaints   ROS Denies recent fever or chills. Denies sinus pressure, nasal congestion, ear pain or sore throat. Denies chest congestion, productive cough or wheezing. Denies chest pains, palpitations and leg swelling Denies abdominal pain, nausea, vomiting,diarrhea or constipation.   Denies dysuria, frequency, hesitancy or incontinence. Denies joint pain, swelling and limitation in mobility. Denies headaches, seizures, numbness, or tingling. Denies depression, anxiety or insomnia. Denies skin break down or rash.   PE  BP 120/78   Pulse 100   Resp 14   Ht 5\' 10"  (1.778 m)   Wt (!) 381 lb 1.3 oz (172.9 kg)   SpO2 94% Comment: room air  BMI 54.68 kg/m   Patient alert and oriented and in no cardiopulmonary distress.  HEENT: No facial asymmetry, EOMI,   oropharynx pink and moist.  Neck supple no JVD, no mass.  Chest: Clear to auscultation bilaterally.  CVS: S1, S2 no murmurs, no S3.Regular rate.  ABD: Soft non tender.   Ext: No edema  MS: Adequate ROM spine, shoulders, hips and knees.  Skin: Intact, no ulcerations or rash noted.  Psych: Good eye contact, normal affect. Memory intact not anxious or depressed appearing.  CNS: CN 2-12 intact, power,  normal throughout.no focal deficits noted.   Assessment & Plan  Essential  hypertension, benign Controlled, no change in medication DASH diet and commitment to daily physical activity for a minimum of 30 minutes discussed and encouraged, as a part of hypertension management. The importance of attaining a healthy weight is also discussed.  BP/Weight 04/11/2018 03/29/2018 03/21/2018 12/14/2017 11/29/2017 11/22/2017 3/76/2831  Systolic BP 517 616 - - 073 710 626  Diastolic BP 78 88 - - 83 80 84  Wt. (Lbs) 381.08 384 387 384 381 382 382  BMI 54.68 55.1 55.53 55.1 54.67 54.81 54.81       Morbid obesity Unchanged Patient re-educated about  the importance of commitment to a  minimum of 150 minutes of exercise per week.  The importance of healthy food choices with portion control discussed. Encouraged to start a food diary, count calories and to consider  joining a support group. Sample diet sheets offered. Goals set by the patient for the next several months.   Weight /BMI 04/11/2018 03/29/2018 03/21/2018  WEIGHT 381 lb 1.3 oz 384 lb 387 lb  HEIGHT 5\' 10"  5\' 10"  5\' 10"   BMI 54.68 kg/m2 55.1 kg/m2 55.53 kg/m2      Uncontrolled type 2 diabetes mellitus with hyperglycemia (HCC) Deteriorated,manged by Endo and meds recently changed. Pt reports non compliance with diet, re educated re the importance of same Tracey [name] is reminded of the importance of commitment to daily physical activity for 30 minutes or more, as able and the need to limit carbohydrate intake to 30 to 60 grams per meal to help with blood sugar control.   The  need to take medication as prescribed, test blood sugar as directed, and to call between visits if there is a concern that blood sugar is uncontrolled is also discussed.   Tracey [name] is reminded of the importance of daily foot exam, annual eye examination, and good blood sugar, blood pressure and cholesterol control.  Diabetic Labs Latest Ref Rng & Units 03/23/2018 01/27/2018 11/01/2017 08/30/2017 08/04/2017  HbA1c <5.7 % of total Hgb 10.5(H) -  9.3(H) - 9.5(H)  Microalbumin mg/dL - - - - -  Micro/Creat Ratio <30 mcg/mg creat - - - - -  Chol <200 mg/dL - 183 - 240(H) -  HDL >50 mg/dL - 40(L) - 48(L) -  Calc LDL mg/dL (calc) - 121(H) - 166(H) -  Triglycerides <150 mg/dL - 108 - 124 -  Creatinine 0.50 - 0.99 mg/dL - 0.80 0.75 - 0.88   BP/Weight 04/11/2018 03/29/2018 03/21/2018 12/14/2017 11/29/2017 11/22/2017 5/88/5027  Systolic BP 741 287 - - 867 672 094  Diastolic BP 78 88 - - 83 80 84  Wt. (Lbs) 381.08 384 387 384 381 382 382  BMI 54.68 55.1 55.53 55.1 54.67 54.81 54.81   Foot/eye exam completion dates Latest Ref Rng & Units 08/19/2016 08/19/2016  Eye Exam No Retinopathy No Retinopathy No Retinopathy  Foot Form Completion - - -        Hyperlipidemia LDL goal <100 Hyperlipidemia:Low fat diet discussed and encouraged.   Lipid Panel  Lab Results  Component Value Date   CHOL 183 01/27/2018   HDL 40 (L) 01/27/2018   LDLCALC 121 (H) 01/27/2018   TRIG 108 01/27/2018   CHOLHDL 4.6 01/27/2018   Updated lab needed at/ before next visit.

## 2018-04-11 NOTE — Assessment & Plan Note (Signed)
Controlled, no change in medication DASH diet and commitment to daily physical activity for a minimum of 30 minutes discussed and encouraged, as a part of hypertension management. The importance of attaining a healthy weight is also discussed.  BP/Weight 04/11/2018 03/29/2018 03/21/2018 12/14/2017 11/29/2017 11/22/2017 10/23/4578  Systolic BP 998 338 - - 250 539 767  Diastolic BP 78 88 - - 83 80 84  Wt. (Lbs) 381.08 384 387 384 381 382 382  BMI 54.68 55.1 55.53 55.1 54.67 54.81 54.81

## 2018-04-11 NOTE — Patient Instructions (Addendum)
F/U end April, 24 or after, call if you need me sooner  Flu vaccine today  Microalb from office today  Remember , no sugar till 100, fruit is oK, so diabetes is controlled and you are healthy  Fasting lipid, CBC and TSH in April , [facility] , when you  Go for lab for Dr Dorris Fetch, collect at checkout  All the best for 2020!

## 2018-04-11 NOTE — Assessment & Plan Note (Signed)
Deteriorated,manged by Endo and meds recently changed. Pt reports non compliance with diet, re educated re the importance of same Tracey [name] is reminded of the importance of commitment to daily physical activity for 30 minutes or more, as able and the need to limit carbohydrate intake to 30 to 60 grams per meal to help with blood sugar control.   The need to take medication as prescribed, test blood sugar as directed, and to call between visits if there is a concern that blood sugar is uncontrolled is also discussed.   Tracey [name] is reminded of the importance of daily foot exam, annual eye examination, and good blood sugar, blood pressure and cholesterol control.  Diabetic Labs Latest Ref Rng & Units 03/23/2018 01/27/2018 11/01/2017 08/30/2017 08/04/2017  HbA1c <5.7 % of total Hgb 10.5(H) - 9.3(H) - 9.5(H)  Microalbumin mg/dL - - - - -  Micro/Creat Ratio <30 mcg/mg creat - - - - -  Chol <200 mg/dL - 183 - 240(H) -  HDL >50 mg/dL - 40(L) - 48(L) -  Calc LDL mg/dL (calc) - 121(H) - 166(H) -  Triglycerides <150 mg/dL - 108 - 124 -  Creatinine 0.50 - 0.99 mg/dL - 0.80 0.75 - 0.88   BP/Weight 04/11/2018 03/29/2018 03/21/2018 12/14/2017 11/29/2017 11/22/2017 12/28/6058  Systolic BP 045 997 - - 741 423 953  Diastolic BP 78 88 - - 83 80 84  Wt. (Lbs) 381.08 384 387 384 381 382 382  BMI 54.68 55.1 55.53 55.1 54.67 54.81 54.81   Foot/eye exam completion dates Latest Ref Rng & Units 08/19/2016 08/19/2016  Eye Exam No Retinopathy No Retinopathy No Retinopathy  Foot Form Completion - - -

## 2018-04-11 NOTE — Assessment & Plan Note (Signed)
Hyperlipidemia:Low fat diet discussed and encouraged.   Lipid Panel  Lab Results  Component Value Date   CHOL 183 01/27/2018   HDL 40 (L) 01/27/2018   LDLCALC 121 (H) 01/27/2018   TRIG 108 01/27/2018   CHOLHDL 4.6 01/27/2018   Updated lab needed at/ before next visit.

## 2018-04-12 ENCOUNTER — Other Ambulatory Visit: Payer: Self-pay | Admitting: Cardiovascular Disease

## 2018-04-12 ENCOUNTER — Other Ambulatory Visit (HOSPITAL_COMMUNITY)
Admission: RE | Admit: 2018-04-12 | Discharge: 2018-04-12 | Disposition: A | Discharge status: Home / Self Care | Payer: Medicare Other | Source: Ambulatory Visit | Attending: Family Medicine | Admitting: Family Medicine

## 2018-04-12 DIAGNOSIS — E1169 Type 2 diabetes mellitus with other specified complication: Secondary | ICD-10-CM | POA: Diagnosis not present

## 2018-04-13 LAB — MICROALBUMIN / CREATININE URINE RATIO
Creatinine, Urine: 229.9 mg/dL
Microalb Creat Ratio: 27.8 mg/g creat (ref 0.0–30.0)
Microalb, Ur: 63.8 ug/mL — ABNORMAL HIGH

## 2018-04-14 ENCOUNTER — Other Ambulatory Visit: Payer: Self-pay | Admitting: Cardiovascular Disease

## 2018-05-05 ENCOUNTER — Encounter: Payer: Self-pay | Admitting: Physician Assistant

## 2018-05-05 NOTE — Progress Notes (Signed)
Cardiology Office Note    Date:  05/06/2018  ID:  Tracey [name], Tracey [name] 10-18-1955, MRN [id number] PCP:  Fayrene Helper, MD  Cardiologist:  [name], MD  Chief Complaint: need med refill  History of Present Illness:  Tracey [name] is a 63 y.o. female with history of severe morbid obesity, baseline sinus tachycardia, HTN, HLD (followed by PCP), PE 1999 s/p TAH, GERD, arthritis, pre-diabetes who presents for overdue follow-up. She has a history of chest pain previously. Dobutamine nuc in 2014 was normal. CT 2014 for CP/tachycardia showed no PE. She saw Dr. Harrington Challenger in 2017 and was noted to be tachycardic at baseline. CBC did not show any acute anemia. 2D echo 06/2015 showed EF 60-65%, grade 1 DD. 24-hour monitor 06/2015 showed NSR, rare PACs/PVCs. Reported symptoms corresponded to NSR. Average HR was 97 so Dr. Harrington Challenger recommended metoprolol BID. Last labs 01/2018 showed normal CMET except glucose 222, LDL 121, 07/2016 CBC showed Hgb 13.3 with microcytic MCV, TSH 0.95.  She returns for overdue follow-up today requesting refill of amlodipine. She is also due for refill of her metoprolol (last filled 02/2018 for 1 month) but somehow she says she thinks she's still taking this. She reports severe chronic dyspnea on exertion which she states is unchanged. She is very sedentary and even sits down to make dinner. She's fallen several times over the last few months due to her legs giving out and being unable to support her. She has worked with a nutritionist for her diabetes before but weight loss has been very difficult. She is not really willing to consider bariatric surgery. She has rare chest pain about 1x a month described as a sharp pain lasting a few seconds that primarily occurs if she hollers at her kids. She denies any CP with exertion. No palpitations. She had a sleep study in 2018 and previously had OSA but states it wasn't bad enough to require treatment. This was followed by Dr. Merlene Laughter and  the last notes we have scanned in were prior to sleep study.  Past Medical History:  Diagnosis Date  . Arthritis   . Diabetes mellitus without complication ([place])   . Diastolic dysfunction 10/3708   Grade 1  . GERD (gastroesophageal reflux disease)   . Hyperlipidemia   . Hyperlipidemia   . Hypertension 04/15/2012  . Morbid obesity ([place])   . PE (pulmonary embolism)   . Pulmonary emboli ([place]) 1999   post TAH  . Sleep apnea     Past Surgical History:  Procedure Laterality Date  . ABDOMINAL HYSTERECTOMY  1999   , totalfor fibroids  . CESAREAN SECTION    . CHOLECYSTECTOMY  1996  . COLONOSCOPY N/A 10/31/2015   Procedure: COLONOSCOPY;  Surgeon: Rogene Houston, MD;  Location: [facility];  Service: Endoscopy;  Laterality: N/A;  930    Current Medications: Current Meds  Medication Sig  . ACCU-CHEK SOFTCLIX LANCETS lancets Use as instructed 4x daily dx E11.65  . amLODipine (NORVASC) 2.5 MG tablet TAKE ONE (1) TABLET BY MOUTH EVERY DAY  . aspirin EC 81 MG tablet Take 1 tablet (81 mg total) by mouth daily.  . ergocalciferol (VITAMIN D2) 50000 units capsule Take 1 capsule (50,000 Units total) by mouth once a week. One capsule once weekly  . glucose blood (ACCU-CHEK AVIVA) test strip Use as instructed 4 x daily dx E11.65  . ibuprofen (ADVIL,MOTRIN) 200 MG tablet Take 200 mg by mouth every 8 (eight) hours as needed for moderate  pain.   . Insulin Degludec (TRESIBA FLEXTOUCH) 200 UNIT/ML SOPN Inject 50 Units into the skin at bedtime.  . Insulin Pen Needle (B-D ULTRAFINE III SHORT PEN) 31G X 8 MM MISC 1 each by Does not apply route as directed.  . metFORMIN (GLUCOPHAGE) 1000 MG tablet Take 1 tablet (1,000 mg total) by mouth 2 (two) times daily with a meal.  . metoprolol tartrate (LOPRESSOR) 25 MG tablet TAKE ONE TABLET TWICE DAILY.  Marland Kitchen omeprazole (PRILOSEC) 20 MG capsule TAKE ONE CAPSULE BY MOUTH DAILY  . rosuvastatin (CRESTOR) 20 MG tablet Take 1 tablet (20 mg total) by mouth daily.  Marland Kitchen  spironolactone (ALDACTONE) 25 MG tablet TAKE ONE (1) TABLET BY MOUTH EVERY DAY  . UNABLE TO FIND Bedside commode x 1  Dx m76.9, m54.4  . UNABLE TO FIND Shower bench x 1 Shower grab bar x 1  Dx: m76.9, m54.5    Allergies:   Haloperidol lactate and Lipitor [atorvastatin calcium]   Social History   Socioeconomic History  . Marital status: Single    Spouse name: Not on file  . Number of children: Not on file  . Years of education: Not on file  . Highest education level: Not on file  Occupational History  . Not on file  Social Needs  . Financial resource strain: Somewhat hard  . Food insecurity:    Worry: Often true    Inability: Often true  . Transportation needs:    Medical: No    Non-medical: No  Tobacco Use  . Smoking status: Never Smoker  . Smokeless tobacco: Never Used  Substance and Sexual Activity  . Alcohol use: No    Alcohol/week: 0.0 standard drinks  . Drug use: No  . Sexual activity: Not Currently  Lifestyle  . Physical activity:    Days per week: 0 days    Minutes per session: 0 min  . Stress: To some extent  Relationships  . Social connections:    Talks on phone: More than three times a week    Gets together: More than three times a week    Attends religious service: Never    Active member of club or organization: No    Attends meetings of clubs or organizations: Never    Relationship status: Divorced  Other Topics Concern  . Not on file  Social History Narrative  . Not on file     Family History:  The patient's family history includes Alcohol abuse in her father; Aneurysm in her brother; Arthritis in her sister; Cirrhosis in her father; Coronary artery disease in her sister; Depression in her mother and son; Diabetes in her mother; Heart attack (age of onset: 46) in her sister; Heart murmur in her son; Hyperlipidemia in her father; Hypertension in her father, mother, and son; Sleep apnea in her sister.  ROS:   Please see the history of present  illness.  All other systems are reviewed and otherwise negative.    PHYSICAL EXAM:   VS:  BP 140/80 (BP Location: Left Arm)   Pulse (!) 113   Ht 5\' 10"  (1.778 m)   Wt (!) 379 lb (171.9 kg)   SpO2 94%   BMI 54.38 kg/m   BMI: Body mass index is 54.38 kg/m. GEN: Morbidly obese AAF in no acute distress HEENT: normocephalic, atraumatic Neck: no JVD, carotid bruits, or masses Cardiac: Reg rhythm, tachycardic, no murmurs, rubs, or gallops, no edema  Respiratory:  clear to auscultation bilaterally, normal work of breathing GI:  soft, nontender, nondistended, + BS MS: no deformity or atrophy Skin: warm and dry, no rash Neuro:  Alert and Oriented x 3, Strength and sensation are intact, follows commands Psych: euthymic mood, full affect  Wt Readings from Last 3 Encounters:  05/06/18 (!) 379 lb (171.9 kg)  04/11/18 (!) 381 lb 1.3 oz (172.9 kg)  03/29/18 (!) 384 lb (174.2 kg)      Studies/Labs Reviewed:   EKG:  EKG was ordered today and personally reviewed by me and demonstrates sinus tach 113bpm inferior TWI as well as new TWI in V3-V6.  Recent Labs: 01/27/2018: ALT 25; BUN 8; Creat 0.80; Potassium 4.4; Sodium 136   Lipid Panel    Component Value Date/Time   CHOL 183 01/27/2018 1445   TRIG 108 01/27/2018 1445   HDL 40 (L) 01/27/2018 1445   CHOLHDL 4.6 01/27/2018 1445   VLDL 23 07/13/2016 1430   LDLCALC 121 (H) 01/27/2018 1445    Additional studies/ records that were reviewed today include: Summarized above.   ASSESSMENT & PLAN:   1. Sinus tachycardia - I suspect driven by obesity hypoventilation syndrome and morbid obesity as her heart requires the extra workload to keep up with her weight. This is chronic. I am not entirely sure she has been taking her metoprolol as her refills would have been out by now. Will titrate to metoprolol 50mg  BID for now. Update CBC, TSH. 2. Abnormal EKG - reviewed with Dr. Harl Bowie (D[facility]) given TW changes in V3-V6. Patient is very poor candidate  for noninvasive ischemic testing. Additionally given recurrent falls recently would be poor candidate for cardiac cath because DAPT would pose bleeding risk. Will start with titration of BB and obtain echocardiogram. Even if this patient were to have coronary disease I am not convinced this would fix her severe dyspnea. She is severely debilitated and her weight remains the crux of her issues. 3. SOB - as above. Suspect multifactorial. Chronic, unchanged for many years, arguing against acute etiology. 4. Morbid obesity - discussed that at this point her obesity is life threatening and likely to perpetuate risk of further cardiovascular compromise such as CHF or atrial arrhythmias. I worry this will remain an ongoing problem. I asked her to reach out to PCP/Dr. Merlene Laughter to clarify what exactly the result was of her sleep study to make sure she did not require CPAP. 5. Essential HTN - will titrate metoprolol, and hold off resumption of amlodipine for now. The patient was instructed to monitor their blood pressure at home and to call if tending to run higher than 099 systolic. 6. HLD - followed by PCP.  Disposition: F/u with Dr. Harrington Challenger in 4-5 weeks to f/u HR, echo and abnormal EKG.  Medication Adjustments/Labs and Tests Ordered: Current medicines are reviewed at length with the patient today.  Concerns regarding medicines are outlined above. Medication changes, Labs and Tests ordered today are summarized above and listed in the Patient Instructions accessible in Encounters.   Signed, Charlie Pitter, PA-C  05/06/2018 3:27 PM    [facility] Location in [place], [place] [address] Ph: [phone]; Fax [phone]

## 2018-05-06 ENCOUNTER — Encounter: Payer: Self-pay | Admitting: Physician Assistant

## 2018-05-06 ENCOUNTER — Ambulatory Visit (INDEPENDENT_AMBULATORY_CARE_PROVIDER_SITE_OTHER): Payer: Medicare Other | Admitting: Physician Assistant

## 2018-05-06 VITALS — BP 140/80 | HR 113 | Ht 70.0 in | Wt 379.0 lb

## 2018-05-06 DIAGNOSIS — E785 Hyperlipidemia, unspecified: Secondary | ICD-10-CM

## 2018-05-06 DIAGNOSIS — I10 Essential (primary) hypertension: Secondary | ICD-10-CM | POA: Diagnosis not present

## 2018-05-06 DIAGNOSIS — R9431 Abnormal electrocardiogram [ECG] [EKG]: Secondary | ICD-10-CM

## 2018-05-06 DIAGNOSIS — R000 Tachycardia, unspecified: Secondary | ICD-10-CM | POA: Diagnosis not present

## 2018-05-06 DIAGNOSIS — R0602 Shortness of breath: Secondary | ICD-10-CM | POA: Diagnosis not present

## 2018-05-06 MED ORDER — METOPROLOL TARTRATE 50 MG PO TABS: 50.0000 mg | ORAL_TABLET | Freq: Two times a day (BID) | ORAL | 3 refills | Status: DC

## 2018-05-06 NOTE — Patient Instructions (Signed)
Medication Instructions:  INCREASE Metoprolol to 50 mg twice a day  If you need a refill on your cardiac medications before your next appointment, please call your pharmacy.   Lab work: Cbc, tsh   If you have labs (blood work) drawn today and your tests are completely normal, you will receive your results only by: Marland Kitchen MyChart Message (if you have MyChart) OR . A paper copy in the mail If you have any lab test that is abnormal or we need to change your treatment, we will call you to review the results.  Testing/Procedures: Your physician has requested that you have an echocardiogram. Echocardiography is a painless test that uses sound waves to create images of your heart. It provides your doctor with information about the size and shape of your heart and how well your heart's chambers and valves are working. This procedure takes approximately one hour. There are no restrictions for this procedure.    Follow-Up: At [facility], you and your health needs are our priority.  As part of our continuing mission to provide you with exceptional heart care, we have created designated Provider Care Teams.  These Care Teams include your primary Cardiologist (physician) and Advanced Practice Providers (APPs -  Physician Assistants and Nurse Practitioners) who all work together to provide you with the care you need, when you need it. You will need a follow up appointment in 5 weeks.  Please call our office 2 months in advance to schedule this appointment.  You may see [name], MD or one of the following Advanced Practice Providers on your designated Care Team:   Bernerd Pho, PA-C [facility]) . Ermalinda Barrios, PA-C ([place])  Any Other Special Instructions Will Be Listed Below (If Applicable). None

## 2018-05-09 DIAGNOSIS — R000 Tachycardia, unspecified: Secondary | ICD-10-CM | POA: Diagnosis not present

## 2018-05-10 LAB — CBC
HCT: 42.5 % (ref 35.0–45.0)
HEMOGLOBIN: 13.8 g/dL (ref 11.7–15.5)
MCH: 24.3 pg — ABNORMAL LOW (ref 27.0–33.0)
MCHC: 32.5 g/dL (ref 32.0–36.0)
MCV: 74.8 fL — ABNORMAL LOW (ref 80.0–100.0)
MPV: 12.4 fL (ref 7.5–12.5)
Platelets: 301 10*3/uL (ref 140–400)
RBC: 5.68 10*6/uL — ABNORMAL HIGH (ref 3.80–5.10)
RDW: 15.4 % — ABNORMAL HIGH (ref 11.0–15.0)
WBC: 11 10*3/uL — ABNORMAL HIGH (ref 3.8–10.8)

## 2018-05-10 LAB — TSH: TSH: 1.23 mIU/L (ref 0.40–4.50)

## 2018-05-16 ENCOUNTER — Ambulatory Visit (HOSPITAL_COMMUNITY): Payer: Medicare Other

## 2018-05-18 ENCOUNTER — Ambulatory Visit (HOSPITAL_COMMUNITY)
Admission: RE | Admit: 2018-05-18 | Discharge: 2018-05-18 | Disposition: A | Discharge status: Home / Self Care | Payer: Medicare Other | Source: Ambulatory Visit | Attending: Physician Assistant | Admitting: Physician Assistant

## 2018-05-18 DIAGNOSIS — R9431 Abnormal electrocardiogram [ECG] [EKG]: Secondary | ICD-10-CM | POA: Diagnosis not present

## 2018-05-18 NOTE — Progress Notes (Addendum)
*  PRELIMINARY RESULTS* Echocardiogram 2D Echocardiogram has been performed. Patient stated last night she had chest burning on and off for no more than 30 minutes. Took Bayer aspirin and it stopped. No chest pain today.  Leavy Cella 05/18/2018, 9:39 AM

## 2018-05-20 ENCOUNTER — Ambulatory Visit: Payer: Medicare Other | Admitting: Podiatry

## 2018-05-21 ENCOUNTER — Other Ambulatory Visit: Payer: Self-pay | Admitting: "Endocrinology

## 2018-05-23 ENCOUNTER — Ambulatory Visit: Payer: Medicare Other | Admitting: Podiatry

## 2018-06-14 ENCOUNTER — Ambulatory Visit: Payer: Medicare Other | Admitting: Student

## 2018-06-17 ENCOUNTER — Ambulatory Visit: Payer: Medicare Other | Admitting: Podiatry

## 2018-06-30 ENCOUNTER — Telehealth: Payer: Self-pay | Admitting: Student

## 2018-06-30 NOTE — Telephone Encounter (Signed)
   Cardiac Questionnaire:    Since your last visit or hospitalization:    1. Have you been having new or worsening chest pain? No   2. Have you been having new or worsening shortness of breath? Chronic - No changes 3. Have you been having new or worsening leg swelling, wt gain, or increase in abdominal girth (pants fitting more tightly)? No   4. Have you had any passing out spells? No    *A YES to any of these questions would result in the appointment being kept. *If all the answers to these questions are NO, we should indicate that given the current situation regarding the worldwide coronarvirus pandemic, at the recommendation of the CDC, we are looking to limit gatherings in our waiting area, and thus will reschedule their appointment beyond four weeks from today.     Patient overall doing well. No new symptoms. Can cancel visit for 3/20 and reschedule beyond 4 weeks.   Signed, Erma Heritage, PA-C 06/30/2018, 11:18 AM Pager: [phone]

## 2018-07-01 ENCOUNTER — Ambulatory Visit: Payer: Medicare Other | Admitting: Student

## 2018-07-07 ENCOUNTER — Other Ambulatory Visit: Payer: Self-pay | Admitting: Family Medicine

## 2018-07-08 ENCOUNTER — Other Ambulatory Visit: Payer: Self-pay | Admitting: Family Medicine

## 2018-07-27 ENCOUNTER — Telehealth: Payer: Self-pay | Admitting: *Deleted

## 2018-07-27 NOTE — Telephone Encounter (Signed)
Virtual Visit Pre-Appointment Phone Call  Steps For Call:  1. Confirm consent - "In the setting of the current Covid19 crisis, you are scheduled for a (phone or video) visit with your provider on (date) at (time).  Just as we do with many in-office visits, in order for you to participate in this visit, we must obtain consent.  If you'd like, I can send this to your mychart (if signed up) or email for you to review.  Otherwise, I can obtain your verbal consent now.  All virtual visits are billed to your insurance company just like a normal visit would be.  By agreeing to a virtual visit, we'd like you to understand that the technology does not allow for your provider to perform an examination, and thus may limit your provider's ability to fully assess your condition.  Finally, though the technology is pretty good, we cannot assure that it will always work on either your or our end, and in the setting of a video visit, we may have to convert it to a phone-only visit.  In either situation, we cannot ensure that we have a secure connection.  Are you willing to proceed?" STAFF: Did the patient verbally acknowledge consent to telehealth visit? Document YES/NO here: Yes  2. Confirm the BEST phone number to call the day of the visit by including in appointment notes  3. Give patient instructions for WebEx/MyChart download to smartphone as below or Doximity/Doxy.me if video visit (depending on what platform provider is using)  4. Advise patient to be prepared with their blood pressure, heart rate, weight, any heart rhythm information, their current medicines, and a piece of paper and pen handy for any instructions they may receive the day of their visit  5. Inform patient they will receive a phone call 15 minutes prior to their appointment time (may be from unknown caller ID) so they should be prepared to answer  6. Confirm that appointment type is correct in Epic appointment notes (VIDEO vs PHONE)      TELEPHONE CALL NOTE  Tracey [name] has been deemed a candidate for a follow-up tele-health visit to limit community exposure during the Covid-19 pandemic. I spoke with the patient via phone to ensure availability of phone/video source, confirm preferred email & phone number, and discuss instructions and expectations.  I reminded INDIYA IZQUIERDO to be prepared with any vital sign and/or heart rhythm information that could potentially be obtained via home monitoring, at the time of her visit. I reminded SHAMECKA HOCUTT to expect a phone call at the time of her visit if her visit.  Lawrence Santiago, LPN 07/25/2438 10:27 PM   INSTRUCTIONS FOR DOWNLOADING THE [place] APP TO SMARTPHONE  - If Apple, ask patient to go to [employer] and type in WebEx in the search bar. [employer], the blue/green circle. If Android, go to [employer] and type in [employer] in the search bar. The app is free but as with any other app downloads, their phone may require them to verify saved payment information or Apple/Android password.  - The patient does NOT have to create an account. - On the day of the visit, the assist will walk the patient through joining the meeting with the meeting number/password.  INSTRUCTIONS FOR DOWNLOADING THE MYCHART APP TO SMARTPHONE  - The patient must first make sure to have activated MyChart and know their login information - If Apple, go to [employer] and type in  MyChart in the search bar and download the app. If Android, ask patient to go to [employer] and type in [place] in the search bar and download the app. The app is free but as with any other app downloads, their phone may require them to verify saved payment information or Apple/Android password.  - The patient will need to then log into the app with their MyChart username and password, and select [facility] as their healthcare provider to link the account. When it is time for your visit, go to  the MyChart app, find appointments, and click Begin Video Visit. Be sure to Select Allow for your device to access the Microphone and Camera for your visit. You will then be connected, and your provider will be with you shortly.  **If they have any issues connecting, or need assistance please contact MyChart service desk [phone])**  **If using a computer, in order to ensure the best quality for their visit they will need to use either of the following Internet Browsers: [employer], or Google Chrome**  IF USING DOXIMITY or DOXY.ME - The patient will receive a link just prior to their visit, either by text or email (to be determined day of appointment depending on if it's doxy.me or Doximity).     FULL LENGTH CONSENT FOR TELE-HEALTH VISIT   I hereby voluntarily request, consent and authorize [facility] and its employed or contracted physicians, physician assistants, nurse practitioners or other licensed health care professionals (the Practitioner), to provide me with telemedicine health care services (the "Services") as deemed necessary by the treating Practitioner. I acknowledge and consent to receive the Services by the Practitioner via telemedicine. I understand that the telemedicine visit will involve communicating with the Practitioner through live audiovisual communication technology and the disclosure of certain medical information by electronic transmission. I acknowledge that I have been given the opportunity to request an in-person assessment or other available alternative prior to the telemedicine visit and am voluntarily participating in the telemedicine visit.  I understand that I have the right to withhold or withdraw my consent to the use of telemedicine in the course of my care at any time, without affecting my right to future care or treatment, and that the Practitioner or I may terminate the telemedicine visit at any time. I understand that I have the right to  inspect all information obtained and/or recorded in the course of the telemedicine visit and may receive copies of available information for a reasonable fee.  I understand that some of the potential risks of receiving the Services via telemedicine include:  Marland Kitchen Delay or interruption in medical evaluation due to technological equipment failure or disruption; . Information transmitted may not be sufficient (e.g. poor resolution of images) to allow for appropriate medical decision making by the Practitioner; and/or  . In rare instances, security protocols could fail, causing a breach of personal health information.  Furthermore, I acknowledge that it is my responsibility to provide information about my medical history, conditions and care that is complete and accurate to the best of my ability. I acknowledge that Practitioner's advice, recommendations, and/or decision may be based on factors not within their control, such as incomplete or inaccurate data provided by me or distortions of diagnostic images or specimens that may result from electronic transmissions. I understand that the practice of medicine is not an exact science and that Practitioner makes no warranties or guarantees regarding treatment outcomes. I acknowledge that I will receive a copy  of this consent concurrently upon execution via email to the email address I last provided but may also request a printed copy by calling the office of [facility].    I understand that my insurance will be billed for this visit.   I have read or had this consent read to me. . I understand the contents of this consent, which adequately explains the benefits and risks of the Services being provided via telemedicine.  . I have been provided ample opportunity to ask questions regarding this consent and the Services and have had my questions answered to my satisfaction. . I give my informed consent for the services to be provided through the use of  telemedicine in my medical care  By participating in this telemedicine visit I agree to the above.

## 2018-08-02 ENCOUNTER — Encounter: Payer: Medicare Other | Attending: Family Medicine | Admitting: Nutrition

## 2018-08-02 ENCOUNTER — Encounter: Payer: Self-pay | Admitting: "Endocrinology

## 2018-08-02 ENCOUNTER — Ambulatory Visit (INDEPENDENT_AMBULATORY_CARE_PROVIDER_SITE_OTHER): Payer: Medicare Other | Admitting: "Endocrinology

## 2018-08-02 ENCOUNTER — Other Ambulatory Visit: Payer: Self-pay

## 2018-08-02 DIAGNOSIS — I10 Essential (primary) hypertension: Secondary | ICD-10-CM | POA: Diagnosis not present

## 2018-08-02 DIAGNOSIS — E782 Mixed hyperlipidemia: Secondary | ICD-10-CM | POA: Diagnosis not present

## 2018-08-02 DIAGNOSIS — E1165 Type 2 diabetes mellitus with hyperglycemia: Secondary | ICD-10-CM | POA: Insufficient documentation

## 2018-08-02 DIAGNOSIS — E118 Type 2 diabetes mellitus with unspecified complications: Secondary | ICD-10-CM | POA: Insufficient documentation

## 2018-08-02 MED ORDER — INSULIN DEGLUDEC 200 UNIT/ML ~~LOC~~ SOPN: 70.0000 [IU] | PEN_INJECTOR | Freq: Every day | SUBCUTANEOUS | 2 refills | Status: DC

## 2018-08-02 NOTE — Progress Notes (Unsigned)
Follow up phone visit  Nutrition Therapy  Visit Type:    f/u  Appt. Start Time: 1530 Appt. End Time: 1600 08/02/2018  Tracey [name], identified by name and date of birth, is a 63 y.o. female with a diagnosis of Diabetes Type 2:  . Bs have been running 400-500's Evenings: 400-500s. Has been   Gained 4 lbs since last visit. Fell after her knee gave out.  Hasn't been exercising. Skips meals. Usually eats 1-2 meals per day. Tresiba 70 units 1000 mg BID Metformin  Lacks commitment to make necessary changes with diet and lifestyle for needed weight loss and improve glycemia. Scared of gastric bypass. Sees DR. Nida next week.   ASSESSMENT  There were no vitals taken for this visit. There is no height or weight on file to calculate BMI.      Lab Results  Component Value Date   HGBA1C 9.3 (H) 11/01/2017   CMP Latest Ref Rng & Units 01/27/2018 11/01/2017 10/22/2017  Glucose 65 - 99 mg/dL 222(H) 254(H) -  BUN 7 - 25 mg/dL 8 12 -  Creatinine 0.50 - 0.99 mg/dL 0.80 0.75 -  Sodium 135 - 146 mmol/L 136 138 -  Potassium 3.5 - 5.3 mmol/L 4.4 4.2 -  Chloride 98 - 110 mmol/L 100 102 -  CO2 20 - 32 mmol/L 24 23 -  Calcium 8.6 - 10.4 mg/dL 9.0 9.2 -  Total Protein 6.1 - 8.1 g/dL 7.6 7.7 7.6  Total Bilirubin 0.2 - 1.2 mg/dL 0.6 0.5 0.7  Alkaline Phos 33 - 130 U/L - - -  AST 10 - 35 U/L 27 31 23   ALT 6 - 29 U/L 25 32(H) 26   B) Ham, cheese egg biscuit, Coffee  1 cup orange juice. L) Baled chicken with green beans, water D) Baked chicken with green beans, water 1 slice bread,,   Learning Objective:  Patient will have a greater understanding of diabetes self-management. Patient education plan is to attend individual and/or group sessions per assessed needs and concerns.   .Goal Drink 2 16 oz bottles of water with each  Eat 2 vegetables with lunch and dinner Don't skip meals Eat 2 piece of fruit with each meal Test blood sugar twice a  Cut out orange juice.  Expected Outcomes:     Improved self management of her DM.  Education material provided: My Plate and Carbohydrate counting sheet  If problems or questions, patient to contact team via:  Phone and Email  Future DSME appointment: -   f/u 2 months.

## 2018-08-02 NOTE — Progress Notes (Signed)
Endocrinology Telehealth Visit Follow up Note -During COVID -19 Pandemic      08/02/2018, 12:38 PM   Subjective:    Patient ID: Tracey [name], female    DOB: Apr 06, 1956.  Tracey [name] is being engaged in Telehealth  follow-up for management of currently uncontrolled symptomatic type 2 diabetes, hyperlipidemia, hypertension. PMD:   Fayrene Helper, MD.  Past Medical History:  Diagnosis Date  . Arthritis   . Diabetes mellitus without complication ([place])   . Diastolic dysfunction 04/6107   Grade 1  . GERD (gastroesophageal reflux disease)   . Hyperlipidemia   . Hyperlipidemia   . Hypertension 04/15/2012  . Morbid obesity ([place])   . PE (pulmonary embolism)   . Pulmonary emboli ([place]) 1999   post TAH  . Sleep apnea    Past Surgical History:  Procedure Laterality Date  . ABDOMINAL HYSTERECTOMY  1999   , totalfor fibroids  . CESAREAN SECTION    . CHOLECYSTECTOMY  1996  . COLONOSCOPY N/A 10/31/2015   Procedure: COLONOSCOPY;  Surgeon: Rogene Houston, MD;  Location: [facility];  Service: Endoscopy;  Laterality: N/A;  930   Social History   Socioeconomic History  . Marital status: Single    Spouse name: Not on file  . Number of children: Not on file  . Years of education: Not on file  . Highest education level: Not on file  Occupational History  . Not on file  Social Needs  . Financial resource strain: Somewhat hard  . Food insecurity:    Worry: Often true    Inability: Often true  . Transportation needs:    Medical: No    Non-medical: No  Tobacco Use  . Smoking status: Never Smoker  . Smokeless tobacco: Never Used  Substance and Sexual Activity  . Alcohol use: No    Alcohol/week: 0.0 standard drinks  . Drug use: No  . Sexual activity: Not Currently  Lifestyle  . Physical activity:    Days per week: 0 days    Minutes per  session: 0 min  . Stress: To some extent  Relationships  . Social connections:    Talks on phone: More than three times a week    Gets together: More than three times a week    Attends religious service: Never    Active member of club or organization: No    Attends meetings of clubs or organizations: Never    Relationship status: Divorced  Other Topics Concern  . Not on file  Social History Narrative  . Not on file   Outpatient Encounter Medications as of 08/02/2018  Medication Sig  . ACCU-CHEK SOFTCLIX LANCETS lancets Use as instructed 4x daily dx E11.65  . aspirin EC 81 MG tablet Take 1 tablet (81 mg total) by mouth daily.  . ergocalciferol (VITAMIN D2) 50000 units  capsule Take 1 capsule (50,000 Units total) by mouth once a week. One capsule once weekly  . glucose blood (ACCU-CHEK AVIVA) test strip Use as instructed 4 x daily dx E11.65  . ibuprofen (ADVIL,MOTRIN) 200 MG tablet Take 200 mg by mouth every 8 (eight) hours as needed for moderate pain.   . Insulin Degludec (TRESIBA FLEXTOUCH) 200 UNIT/ML SOPN Inject 70 Units into the skin at bedtime.  . Insulin Pen Needle (B-D ULTRAFINE III SHORT PEN) 31G X 8 MM MISC 1 each by Does not apply route as directed.  . metFORMIN (GLUCOPHAGE) 1000 MG tablet TAKE ONE TABLET (1000 MG TOTAL) BY MOUTHTWO TIMES DAILY WITH A MEAL.  . metoprolol tartrate (LOPRESSOR) 50 MG tablet Take 1 tablet (50 mg total) by mouth 2 (two) times daily.  Marland Kitchen omeprazole (PRILOSEC) 20 MG capsule TAKE ONE CAPSULE BY MOUTH DAILY  . rosuvastatin (CRESTOR) 20 MG tablet TAKE ONE TABLET (20MG  TOTAL) BY MOUTH DAILY  . spironolactone (ALDACTONE) 25 MG tablet TAKE ONE (1) TABLET BY MOUTH EVERY DAY  . UNABLE TO FIND Bedside commode x 1  Dx m76.9, m54.4  . UNABLE TO FIND Shower bench x 1 Shower grab bar x 1  Dx: m76.9, m54.5  . [DISCONTINUED] Insulin Degludec (TRESIBA FLEXTOUCH) 200 UNIT/ML SOPN Inject 50 Units into the skin at bedtime.   No facility-administered encounter  medications on file as of 08/02/2018.     ALLERGIES: Allergies  Allergen Reactions  . Haloperidol Lactate Anaphylaxis  . Lipitor [Atorvastatin Calcium] Other (See Comments)    Markedly elevated liver enzymes    VACCINATION STATUS: Immunization History  Administered Date(s) Administered  . Influenza,inj,Quad PF,6+ Mos 04/01/2015, 03/16/2016, 01/20/2017, 04/11/2018  . Pneumococcal Polysaccharide-23 05/13/2015    Diabetes  She presents for her follow-up diabetic visit. She has type 2 diabetes mellitus. Onset time: He was diagnosed at approximate age of 63 years. Her disease course has been worsening. There are no hypoglycemic associated symptoms. Pertinent negatives for hypoglycemia include no confusion, headaches, pallor or seizures. Associated symptoms include blurred vision, fatigue, polydipsia and polyuria. Pertinent negatives for diabetes include no chest pain and no polyphagia. There are no hypoglycemic complications. Symptoms are worsening. There are no diabetic complications. Risk factors for coronary artery disease include diabetes mellitus, dyslipidemia, family history, hypertension, obesity, post-menopausal and sedentary lifestyle. She is following a generally unhealthy diet. When asked about meal planning, she reported none. She has had a previous visit with a dietitian. She never participates in exercise. Her overall blood glucose range is >200 mg/dl. (-she reports that due to recent yeast infection , her BG readings are above 300 x 1 week. ) An ACE inhibitor/angiotensin II receptor blocker is not being taken. She does not see a podiatrist.Eye exam is not current.  Hyperlipidemia  This is a chronic problem. The current episode started more than 1 year ago. The problem is uncontrolled. Exacerbating diseases include diabetes and obesity. Pertinent negatives include no chest pain, myalgias or shortness of breath. Current antihyperlipidemic treatment includes statins. Risk factors for  coronary artery disease include diabetes mellitus, dyslipidemia, hypertension, obesity, a sedentary lifestyle and post-menopausal.  Hypertension  Associated symptoms include blurred vision. Pertinent negatives include no chest pain, headaches, palpitations or shortness of breath. Risk factors for coronary artery disease include dyslipidemia, diabetes mellitus, obesity, sedentary lifestyle and post-menopausal state. Past treatments include calcium channel blockers and beta blockers.     Objective:    There were no vitals taken for this visit.  Wt Readings from Last 3  Encounters:  05/06/18 (!) 379 lb (171.9 kg)  04/11/18 (!) 381 lb 1.3 oz (172.9 kg)  03/29/18 (!) 384 lb (174.2 kg)       CMP     Component Value Date/Time   NA 136 01/27/2018 1445   K 4.4 01/27/2018 1445   CL 100 01/27/2018 1445   CO2 24 01/27/2018 1445   GLUCOSE 222 (H) 01/27/2018 1445   BUN 8 01/27/2018 1445   CREATININE 0.80 01/27/2018 1445   CALCIUM 9.0 01/27/2018 1445   PROT 7.6 01/27/2018 1445   ALBUMIN 3.9 07/13/2016 1430   AST 27 01/27/2018 1445   ALT 25 01/27/2018 1445   ALKPHOS 61 07/13/2016 1430   BILITOT 0.6 01/27/2018 1445   GFRNONAA 80 01/27/2018 1445   GFRAA 93 01/27/2018 1445     Diabetic Labs (most recent): Lab Results  Component Value Date   HGBA1C 10.5 (H) 03/23/2018   HGBA1C 9.3 (H) 11/01/2017   HGBA1C 9.5 (H) 08/04/2017     Lipid Panel ( most recent) Lipid Panel     Component Value Date/Time   CHOL 183 01/27/2018 1445   TRIG 108 01/27/2018 1445   HDL 40 (L) 01/27/2018 1445   CHOLHDL 4.6 01/27/2018 1445   VLDL 23 07/13/2016 1430   LDLCALC 121 (H) 01/27/2018 1445      Lab Results  Component Value Date   TSH 1.23 05/09/2018   TSH 0.95 07/13/2016   TSH 1.26 07/10/2015   TSH 1.382 11/17/2013   TSH 0.519 04/14/2012   TSH 1.525 11/07/2009      Lipid Panel     Component Value Date/Time   CHOL 183 01/27/2018 1445   TRIG 108 01/27/2018 1445   HDL 40 (L) 01/27/2018 1445    CHOLHDL 4.6 01/27/2018 1445   VLDL 23 07/13/2016 1430   LDLCALC 121 (H) 01/27/2018 1445     Assessment & Plan:   1. Uncontrolled type 2 diabetes mellitus with hyperglycemia ([place])  - Nevayah D Bodkins has currently uncontrolled symptomatic type 2 DM since 63 years of age. -She reports persistent hyperglycemia of 300+ x 1 week due to yeast infection. She has not done her pre-visit labs. Her  a1c was high at  10.5% in December 2019, increasing from 9.3%      -her diabetes is complicated by obesity/sedentary life and SHERREE SHANKMAN remains at a high risk for more acute and chronic complications which include CAD, CVA, CKD, retinopathy, and neuropathy. These are all discussed in detail with the patient.  - I have counseled her on diet management and weight loss, by adopting a carbohydrate restricted/protein rich diet.  - Patient admits there is a room for improvement in her diet and drink choices. -  Suggestion is made for her to avoid simple carbohydrates  from her diet including Cakes, Sweet Desserts / Pastries, Ice Cream, Soda (diet and regular), Sweet Tea, Candies, Chips, Cookies, Store Bought Juices, Alcohol in Excess of  1-2 drinks a day, Artificial Sweeteners, and "Sugar-free" Products. This will help patient to have stable blood glucose profile and potentially avoid unintended weight gain.    - I encouraged her to switch to  unprocessed or minimally processed complex starch and increased protein intake (animal or plant source), fruits, and vegetables.  - she is advised to stick to a routine mealtimes to eat 3 meals  a day and avoid unnecessary snacks ( to snack only to correct hypoglycemia).   - she is following with  Jearld Fenton, RDN, CDE for  individualized diabetes education.  - I have approached her with the following individualized plan to manage diabetes and patient agrees:   - Based on her  Report of persistent  Hyperglycemia, she will need higher dose of  insulin. She may need intensive therapy with basal/bolus insulin, but she has room on her basal insulin before considering bolus insulin.  -She is advised to increase her Tresiba to 70 units nightly associated with strict monitoring of blood glucose 2 times a day-daily before breakfast and at bedtime.   -Patient is encouraged to call clinic for blood glucose levels less than 70 or above 200 mg /dl. -She is advised to continue metformin 1000 mg p.o. twice daily, and Januvia 100 mg p.o. daily with breakfast.   She is advised to go to her lab tomorrow fasting and will be reassessed in 1 week.    2) BP/HTN: she is advised to home monitor blood pressure and report if > 140/90 on 2 separate readings.  She is advised to continue her current medications including spironolactone, metoprolol, amlodipine.   3) Lipids/HPL: Her recent lipid panel showed uncontrolled LDL of 166.   She is advised to continue Crestor 10 mg p.o. nightly.   She will need fasting lipid panel on subsequent visits.   4) Chronic Care/Health Maintenance:  -she  Is on Statin medications and  is encouraged to continue to follow up with Ophthalmology, Dentist,  Podiatrist at least yearly or according to recommendations, and advised to   stay away from smoking. I have recommended yearly flu vaccine and pneumonia vaccination at least every 5 years; moderate intensity exercise for up to 150 minutes weekly; and  sleep for at least 7 hours a day.  - I advised patient to maintain close follow up with Fayrene Helper, MD for primary care needs.  - Patient Care Time Today:  25 min, of which >50% was spent in reviewing her  current and  previous labs/studies, her blood glucose readings, previous treatments, and medications doses and developing a plan for long-term care based on the latest recommendations for standards of care.  Tracey [name] participated in the discussions, expressed understanding, and voiced agreement with the above  plans.  All questions were answered to her satisfaction. she is encouraged to contact clinic should she have any questions or concerns prior to her return visit.   Follow up plan: - Return in about 1 week (around 08/09/2018), or she will do fasting labs tomorrow ., for Follow up with Pre-visit Labs, Meter, and Logs.  Glade Lloyd, MD [facility] [address] [place], [place] [address] Phone: [phone]  Fax: [phone]    08/02/2018, 12:38 PM  This note was partially dictated with voice recognition software. Similar sounding words can be transcribed inadequately or may not  be corrected upon review.

## 2018-08-04 ENCOUNTER — Telehealth (INDEPENDENT_AMBULATORY_CARE_PROVIDER_SITE_OTHER): Payer: Medicare Other | Admitting: Student

## 2018-08-04 ENCOUNTER — Other Ambulatory Visit: Payer: Self-pay | Admitting: Family Medicine

## 2018-08-04 ENCOUNTER — Encounter: Payer: Self-pay | Admitting: Student

## 2018-08-04 DIAGNOSIS — E1165 Type 2 diabetes mellitus with hyperglycemia: Secondary | ICD-10-CM | POA: Diagnosis not present

## 2018-08-04 DIAGNOSIS — I10 Essential (primary) hypertension: Secondary | ICD-10-CM

## 2018-08-04 DIAGNOSIS — Z7189 Other specified counseling: Secondary | ICD-10-CM

## 2018-08-04 DIAGNOSIS — R000 Tachycardia, unspecified: Secondary | ICD-10-CM

## 2018-08-04 DIAGNOSIS — E785 Hyperlipidemia, unspecified: Secondary | ICD-10-CM

## 2018-08-04 MED ORDER — FLUCONAZOLE 150 MG PO TABS: ORAL_TABLET | ORAL | 2 refills | Status: DC

## 2018-08-04 NOTE — Progress Notes (Signed)
Virtual Visit via Telephone Note   This visit type was conducted due to national recommendations for restrictions regarding the COVID-19 Pandemic (e.g. social distancing) in an effort to limit this patient's exposure and mitigate transmission in our community.  Due to her co-morbid illnesses, this patient is at least at moderate risk for complications without adequate follow up.  This format is felt to be most appropriate for this patient at this time.  The patient did not have access to video technology/had technical difficulties with video requiring transitioning to audio format only (telephone).  All issues noted in this document were discussed and addressed.  No physical exam could be performed with this format.  Please refer to the patient's chart for her  consent to telehealth for [facility].   Evaluation Performed:  Follow-up visit  Date:  08/04/2018   ID:  Tracey, [name] [birth year], MRN [id number]  Patient Location: Home Provider Location: Home  PCP:  Fayrene Helper, MD  Cardiologist:  [name], MD  Electrophysiologist:  None   Chief Complaint:  3 month visit  History of Present Illness:    Tracey [name] is a 63 y.o. female with past medical history of HTN, HLD, prior PE, pre-diabetes, GERD, and morbid obesity who presents for a 68-month follow-up.   She was last examined by Melina Copa, PA-C in 04/2018 and reported chronic dyspnea on exertion which was unchanged but denied any palpitations or exertional chest pain. Was noted to be tachycardiac with HR of 113 during her visit which was thought to be secondary to obesity hypoventilation syndrome. Lopressor was titrated to 50mg  BID and given that her EKG showed T-wave abnormalities along V3-V6, a repeat echocardiogram was obtained to assess LV function. This showed a preserved EF of > 65% with no regional WMA. There was mld LVH and mild aortic annular calcification.   In talking with the patient today, she  reports doing well from a cardiac perspective since her last visit. Feels like her dyspnea improved with titration of Lopressor. Denies any specific orthopnea, PND, or edema. No chest pain or palpitations.   She reports her glucose has been elevated in the 400's to 500's and her Insulin dose was just adjusted by Endocrinology earlier today. She also reports having symptoms concerning for a vaginal yeast infection and reports this has happened in the past when her glucose was elevated. She tried contacting her PCP but says she was unsuccessful with this and asked me to reach out to Dr. Moshe Cipro for her (staff message sent to Dr. Moshe Cipro).   The patient does not have symptoms concerning for COVID-19 infection (fever, chills, cough, or new shortness of breath).    Past Medical History:  Diagnosis Date  . Arthritis   . Diabetes mellitus without complication ([place])   . Diastolic dysfunction 04/8561   Grade 1  . GERD (gastroesophageal reflux disease)   . Hyperlipidemia   . Hyperlipidemia   . Hypertension 04/15/2012  . Morbid obesity ([place])   . PE (pulmonary embolism)   . Pulmonary emboli ([place]) 1999   post TAH  . Sleep apnea    Past Surgical History:  Procedure Laterality Date  . ABDOMINAL HYSTERECTOMY  1999   , totalfor fibroids  . CESAREAN SECTION    . CHOLECYSTECTOMY  1996  . COLONOSCOPY N/A 10/31/2015   Procedure: COLONOSCOPY;  Surgeon: Rogene Houston, MD;  Location: [facility];  Service: Endoscopy;  Laterality: N/A;  930     Current  Meds  Medication Sig  . ACCU-CHEK SOFTCLIX LANCETS lancets Use as instructed 4x daily dx E11.65  . aspirin EC 81 MG tablet Take 1 tablet (81 mg total) by mouth daily.  . ergocalciferol (VITAMIN D2) 50000 units capsule Take 1 capsule (50,000 Units total) by mouth once a week. One capsule once weekly  . glucose blood (ACCU-CHEK AVIVA) test strip Use as instructed 4 x daily dx E11.65  . ibuprofen (ADVIL,MOTRIN) 200 MG tablet Take 200 mg by mouth every 8  (eight) hours as needed for moderate pain.   . Insulin Degludec (TRESIBA FLEXTOUCH) 200 UNIT/ML SOPN Inject 70 Units into the skin at bedtime.  . Insulin Pen Needle (B-D ULTRAFINE III SHORT PEN) 31G X 8 MM MISC 1 each by Does not apply route as directed.  . metFORMIN (GLUCOPHAGE) 1000 MG tablet TAKE ONE TABLET (1000 MG TOTAL) BY MOUTHTWO TIMES DAILY WITH A MEAL.  . metoprolol tartrate (LOPRESSOR) 50 MG tablet Take 1 tablet (50 mg total) by mouth 2 (two) times daily.  Marland Kitchen omeprazole (PRILOSEC) 20 MG capsule TAKE ONE CAPSULE BY MOUTH DAILY  . rosuvastatin (CRESTOR) 20 MG tablet TAKE ONE TABLET (20MG  TOTAL) BY MOUTH DAILY  . spironolactone (ALDACTONE) 25 MG tablet TAKE ONE (1) TABLET BY MOUTH EVERY DAY  . UNABLE TO FIND Bedside commode x 1  Dx m76.9, m54.4  . UNABLE TO FIND Shower bench x 1 Shower grab bar x 1  Dx: m76.9, m54.5     Allergies:   Haloperidol lactate and Lipitor [atorvastatin calcium]   Social History   Tobacco Use  . Smoking status: Never Smoker  . Smokeless tobacco: Never Used  Substance Use Topics  . Alcohol use: No    Alcohol/week: 0.0 standard drinks  . Drug use: No     Family Hx: The patient's family history includes Alcohol abuse in her father; Aneurysm in her brother; Arthritis in her sister; Cirrhosis in her father; Coronary artery disease in her sister; Depression in her mother and son; Diabetes in her mother; Heart attack (age of onset: 78) in her sister; Heart murmur in her son; Hyperlipidemia in her father; Hypertension in her father, mother, and son; Sleep apnea in her sister.  ROS:   Please see the history of present illness.     All other systems reviewed and are negative.   Prior CV studies:   The following studies were reviewed today:  Echocardiogram: 05/18/2018 IMPRESSIONS   1. The left ventricle has hyperdynamic systolic function of >28%. The cavity size is normal. There is mildly increased left ventricular wall thickness. Indeterminate  diastolic function.  2. The right ventricle has normal systolic function. The cavity in normal in size. There is no increase in right ventricular wall thickness. Right ventricular systolic pressure could not be assessed.  3. The aortic valve is tricuspid. There is mild aortic annular calcification noted.  4. The mitral valve is normal in structure.  5. The pulmonic valve is grossly normal.  6. The aortic root is normal in size and structure.  Labs/Other Tests and Data Reviewed:    EKG:  No ECG reviewed.  Recent Labs: 01/27/2018: ALT 25; BUN 8; Creat 0.80; Potassium 4.4; Sodium 136 05/09/2018: Hemoglobin 13.8; Platelets 301; TSH 1.23   Recent Lipid Panel Lab Results  Component Value Date/Time   CHOL 183 01/27/2018 02:45 PM   TRIG 108 01/27/2018 02:45 PM   HDL 40 (L) 01/27/2018 02:45 PM   CHOLHDL 4.6 01/27/2018 02:45 PM   LDLCALC 121 (H)  01/27/2018 02:45 PM    Wt Readings from Last 3 Encounters:  05/06/18 (!) 379 lb (171.9 kg)  04/11/18 (!) 381 lb 1.3 oz (172.9 kg)  03/29/18 (!) 384 lb (174.2 kg)     Objective:    Vital Signs:  There were no vitals taken for this visit.   General: Pleasant, female sounding in NAD Psych: Normal affect. Neuro: Alert and oriented X 3. Lungs:  Respirations sound regular and unlabored while talking on the phone.    ASSESSMENT & PLAN:    1. Sinus Tachycardia - recent echocardiogram showed a preserved EF greater than 65% with no regional WMA. There was mild LVH and mild aortic annular calcification.  - she denies any recent palpitations and feels her dyspnea improved with titration of Lopressor. Continue Lopressor 50mg  BID.    2. HTN - she does not have a BP cuff at home but says this was well-controlled at her most recent office visit. Remains on Lopressor 50mg  BID and Spironolactone 25mg  daily.   3. HLD - followed by PCP. Due for repeat FLP. Remains on Crestor 20mg  daily.   4. Morbid Obesity - she has lost 5 lbs since 03/2018. Continued  weight loss encouraged.   5. COVID-19 Education  - The signs and symptoms of COVID-19 were discussed with the patient. The importance of social distancing was discussed today.   Time:   Today, I have spent 17 minutes with the patient with telehealth technology discussing the above problems.     Medication Adjustments/Labs and Tests Ordered: Current medicines are reviewed at length with the patient today.  Concerns regarding medicines are outlined above.   Tests Ordered: No orders of the defined types were placed in this encounter.   Medication Changes: No orders of the defined types were placed in this encounter.   Disposition:  Follow up with Dr. Harrington Challenger in 6 months.   Signed, Erma Heritage, PA-C  08/04/2018 5:35 PM    [facility]

## 2018-08-04 NOTE — Patient Instructions (Signed)
Medication Instructions:  Your physician recommends that you continue on your current medications as directed. Please refer to the Current Medication list given to you today.   Labwork: None   Testing/Procedures: None   Follow-Up: Your physician wants you to follow-up in: 6 Months with Dr. Harrington Challenger. You will receive a reminder letter in the mail two months in advance. If you don't receive a letter, please call our office to schedule the follow-up appointment.   Any Other Special Instructions Will Be Listed Below (If Applicable).     If you need a refill on your cardiac medications before your next appointment, please call your pharmacy.  Thank you for choosing [facility]!

## 2018-08-05 LAB — COMPLETE METABOLIC PANEL WITH GFR
AG Ratio: 1.1 (calc) (ref 1.0–2.5)
ALT: 28 U/L (ref 6–29)
AST: 37 U/L — ABNORMAL HIGH (ref 10–35)
Albumin: 4 g/dL (ref 3.6–5.1)
Alkaline phosphatase (APISO): 79 U/L (ref 37–153)
BUN: 8 mg/dL (ref 7–25)
CO2: 22 mmol/L (ref 20–32)
Calcium: 9.4 mg/dL (ref 8.6–10.4)
Chloride: 96 mmol/L — ABNORMAL LOW (ref 98–110)
Creat: 0.79 mg/dL (ref 0.50–0.99)
GFR, EST AFRICAN AMERICAN: 94 mL/min/{1.73_m2} (ref >=60)
GFR, Est Non African American: 81 mL/min/{1.73_m2} (ref >=60)
Globulin: 3.5 g/dL (calc) (ref 1.9–3.7)
Glucose, Bld: 336 mg/dL — ABNORMAL HIGH (ref 65–139)
Potassium: 4.5 mmol/L (ref 3.5–5.3)
Sodium: 133 mmol/L — ABNORMAL LOW (ref 135–146)
Total Bilirubin: 0.7 mg/dL (ref 0.2–1.2)
Total Protein: 7.5 g/dL (ref 6.1–8.1)

## 2018-08-05 LAB — HEMOGLOBIN A1C: Hgb A1c MFr Bld: >14 % of total Hgb — ABNORMAL HIGH (ref <5.7)

## 2018-08-09 ENCOUNTER — Ambulatory Visit (INDEPENDENT_AMBULATORY_CARE_PROVIDER_SITE_OTHER): Payer: Medicare Other | Admitting: "Endocrinology

## 2018-08-09 ENCOUNTER — Encounter: Payer: Self-pay | Admitting: Family Medicine

## 2018-08-09 ENCOUNTER — Encounter: Payer: Self-pay | Admitting: "Endocrinology

## 2018-08-09 ENCOUNTER — Other Ambulatory Visit: Payer: Self-pay

## 2018-08-09 ENCOUNTER — Ambulatory Visit (INDEPENDENT_AMBULATORY_CARE_PROVIDER_SITE_OTHER): Payer: Medicare Other | Admitting: Family Medicine

## 2018-08-09 VITALS — BP 142/84 | HR 96 | Ht 70.0 in | Wt 380.0 lb

## 2018-08-09 DIAGNOSIS — E782 Mixed hyperlipidemia: Secondary | ICD-10-CM

## 2018-08-09 DIAGNOSIS — I10 Essential (primary) hypertension: Secondary | ICD-10-CM | POA: Diagnosis not present

## 2018-08-09 DIAGNOSIS — E1165 Type 2 diabetes mellitus with hyperglycemia: Secondary | ICD-10-CM | POA: Diagnosis not present

## 2018-08-09 DIAGNOSIS — B373 Candidiasis of vulva and vagina: Secondary | ICD-10-CM | POA: Diagnosis not present

## 2018-08-09 DIAGNOSIS — B3731 Acute candidiasis of vulva and vagina: Secondary | ICD-10-CM

## 2018-08-09 MED ORDER — CLOTRIMAZOLE 2 % VA CREA: 1.0000 | TOPICAL_CREAM | Freq: Every day | VAGINAL | 0 refills | Status: DC

## 2018-08-09 MED ORDER — INSULIN DEGLUDEC 200 UNIT/ML ~~LOC~~ SOPN: 80.0000 [IU] | PEN_INJECTOR | Freq: Every day | SUBCUTANEOUS | 2 refills | Status: DC

## 2018-08-09 NOTE — Progress Notes (Signed)
Virtual Visit via Telephone Note  I connected with Tracey [name] on 08/09/18 at  1:40 PM EDT by telephone and verified that I am speaking with the correct person using two identifiers.   I discussed the limitations, risks, security and privacy concerns of performing an evaluation and management service by telephone and the availability of in person appointments. I also discussed with the patient that there may be a patient responsible charge related to this service. The patient expressed understanding and agreed to proceed. I am in the office and the patient is in her home   History of Present Illness: F/U chronic problems in particular uncontrolled diabetes and recurrent vaginitis from yeast infection Was recently evaluated by Cardiology, no change in management. Reports blood sugar remains more elevated than it should be and , which she states is mainly because of poor food choice Denies recent fever or chills.c/o chronic fatigue with polyuria and polydipsia Denies sinus pressure, nasal congestion, ear pain or sore throat. Denies chest congestion, productive cough or wheezing. Denies chest pains, palpitations and leg swelling Denies abdominal pain, nausea, vomiting,diarrhea or constipation.   Denies dysuria, frequency, hesitancy or incontinence c/o joint pain, swelling and limitation in mobility. Denies headaches, seizures, numbness, or tingling. Denies depression, anxiety or insomnia. Denies skin break down or rash.       Observations/Objective: BP (!) 142/84   Pulse 96   Ht 5\' 10"  (1.778 m)   Wt (!) 380 lb (172.4 kg)   BMI 54.52 kg/m  Good communication with no confusion and intact memory. Alert and oriented x 3 No signs of respiratory distress during sppech    Assessment and Plan: Essential hypertension, benign Uncontrolled , will adjust med at next in office visit if this remains the case DASH diet and commitment to daily physical activity for a minimum of 30  minutes discussed and encouraged, as a part of hypertension management. The importance of attaining a healthy weight is also discussed.  BP/Weight 08/09/2018 08/09/2018 05/06/2018 04/11/2018 03/29/2018 20/05/5425 0/09/2374  Systolic BP 283 151 761 607 371 - -  Diastolic BP 84 84 80 78 88 - -  Wt. (Lbs) 380 380 379 381.08 384 387 384  BMI 54.52 54.52 54.38 54.68 55.1 55.53 55.1       Morbid obesity  Unchanged Patient re-educated about  the importance of commitment to a  minimum of 150 minutes of exercise per week as able.  The importance of healthy food choices with portion control discussed, as well as eating regularly and within a 12 hour window most days. The need to choose "clean , green" food 50 to 75% of the time is discussed, as well as to make water the primary drink and set a goal of 64 ounces water daily.  Encouraged to start a food diary,  and to consider  joining a support group. Sample diet sheets offered. Goals set by the patient for the next several months.   Weight /BMI 08/09/2018 08/09/2018 05/06/2018  WEIGHT 380 lb 380 lb 379 lb  HEIGHT 5\' 10"  5\' 10"  5\' 10"   BMI 54.52 kg/m2 54.52 kg/m2 54.38 kg/m2      Uncontrolled type 2 diabetes mellitus with hyperglycemia ([place]) Menaged by Endo and remain uncontrolled and has deteriorated.. Pt needs to change food choice and is still not doing this Tracey [name] is reminded of the importance of commitment to daily physical activity for 30 minutes or more, as able and the need to limit carbohydrate intake to 30 to 60  grams per meal to help with blood sugar control.   The need to take medication as prescribed, test blood sugar as directed, and to call between visits if there is a concern that blood sugar is uncontrolled is also discussed.   Tracey [name] is reminded of the importance of daily foot exam, annual eye examination, and good blood sugar, blood pressure and cholesterol control.  Diabetic Labs Latest Ref Rng & Units 08/04/2018  04/12/2018 03/23/2018 01/27/2018 11/01/2017  HbA1c <5.7 % of total Hgb >14.0(H) - 10.5(H) - 9.3(H)  Microalbumin Not Estab. ug/mL - 63.8(H) - - -  Micro/Creat Ratio 0.0 - 30.0 mg/g creat - 27.8 - - -  Chol <200 mg/dL - - - 183 -  HDL >50 mg/dL - - - 40(L) -  Calc LDL mg/dL (calc) - - - 121(H) -  Triglycerides <150 mg/dL - - - 108 -  Creatinine 0.50 - 0.99 mg/dL 0.79 - - 0.80 0.75   BP/Weight 08/09/2018 08/09/2018 05/06/2018 04/11/2018 03/29/2018 77/07/1285 11/17/7670  Systolic BP 094 709 628 366 294 - -  Diastolic BP 84 84 80 78 88 - -  Wt. (Lbs) 380 380 379 381.08 384 387 384  BMI 54.52 54.52 54.38 54.68 55.1 55.53 55.1   Foot/eye exam completion dates Latest Ref Rng & Units 08/19/2016 08/19/2016  Eye Exam No Retinopathy No Retinopathy No Retinopathy  Foot Form Completion - - -        Mixed hyperlipidemia Hyperlipidemia:Low fat diet discussed and encouraged.   Lipid Panel  Lab Results  Component Value Date   CHOL 183 01/27/2018   HDL 40 (L) 01/27/2018   LDLCALC 121 (H) 01/27/2018   TRIG 108 01/27/2018   CHOLHDL 4.6 01/27/2018   Not at goal Updated lab needed at/ before next visit.     Candidal vulvovaginitis Recurrent and chronic problem , worsened with deterioration in blood sugar control Explained connection to pt Topical and oral medication is prescribed    Follow Up Instructions:    I discussed the assessment and treatment plan with the patient. The patient was provided an opportunity to ask questions and all were answered. The patient agreed with the plan and demonstrated an understanding of the instructions.   The patient was advised to call back or seek an in-person evaluation if the symptoms worsen or if the condition fails to improve as anticipated.  I provided 25 minutes of non-face-to-face time during this encounter.   Tula Nakayama, MD

## 2018-08-09 NOTE — Patient Instructions (Signed)

## 2018-08-09 NOTE — Progress Notes (Signed)
08/09/2018, 11:49 AM Endocrinology follow-up note  Subjective:    Patient ID: Tracey [name], female    DOB: 1955/05/03.  Tracey [name] is being seen in follow-up  for management of currently uncontrolled symptomatic type 2 diabetes, hyperlipidemia, hypertension. PMD:   Fayrene Helper, MD.  Past Medical History:  Diagnosis Date  . Arthritis   . Diabetes mellitus without complication ([place])   . Diastolic dysfunction 07/5362   Grade 1  . GERD (gastroesophageal reflux disease)   . Hyperlipidemia   . Hyperlipidemia   . Hypertension 04/15/2012  . Morbid obesity ([place])   . PE (pulmonary embolism)   . Pulmonary emboli ([place]) 1999   post TAH  . Sleep apnea    Past Surgical History:  Procedure Laterality Date  . ABDOMINAL HYSTERECTOMY  1999   , totalfor fibroids  . CESAREAN SECTION    . CHOLECYSTECTOMY  1996  . COLONOSCOPY N/A 10/31/2015   Procedure: COLONOSCOPY;  Surgeon: Rogene Houston, MD;  Location: [facility];  Service: Endoscopy;  Laterality: N/A;  930   Social History   Socioeconomic History  . Marital status: Single    Spouse name: Not on file  . Number of children: Not on file  . Years of education: Not on file  . Highest education level: Not on file  Occupational History  . Not on file  Social Needs  . Financial resource strain: Somewhat hard  . Food insecurity:    Worry: Often true    Inability: Often true  . Transportation needs:    Medical: No    Non-medical: No  Tobacco Use  . Smoking status: Never Smoker  . Smokeless tobacco: Never Used  Substance and Sexual Activity  . Alcohol use: No    Alcohol/week: 0.0 standard drinks  . Drug use: No  . Sexual activity: Not Currently  Lifestyle  . Physical activity:    Days per week: 0 days    Minutes per session: 0 min  . Stress: To some extent  Relationships  . Social connections:     Talks on phone: More than three times a week    Gets together: More than three times a week    Attends religious service: Never    Active member of club or organization: No    Attends meetings of clubs or organizations: Never    Relationship status: Divorced  Other Topics Concern  . Not on file  Social History Narrative  . Not on file   Outpatient Encounter Medications as of 08/09/2018  Medication Sig  . ACCU-CHEK SOFTCLIX LANCETS lancets Use as instructed 4x daily dx E11.65  . aspirin EC 81 MG tablet Take 1 tablet (81 mg total) by mouth daily.  . ergocalciferol (VITAMIN D2) 50000 units capsule Take 1 capsule (50,000 Units total) by mouth once a week. One capsule once weekly  . fluconazole (DIFLUCAN) 150 MG tablet Take one tablet once daily , by mouth  for vaginal itch for 2 days  . glucose blood (ACCU-CHEK AVIVA) test strip Use as instructed 4 x daily dx E11.65  . ibuprofen (ADVIL,MOTRIN) 200 MG tablet Take 200 mg by mouth every 8 (eight) hours as needed for moderate pain.   . Insulin Degludec (TRESIBA FLEXTOUCH) 200 UNIT/ML SOPN Inject 80 Units into the skin at bedtime.  . Insulin Pen Needle (B-D ULTRAFINE III SHORT PEN) 31G X 8 MM MISC 1 each by Does not apply route as directed.  . metFORMIN (GLUCOPHAGE) 1000 MG tablet TAKE ONE TABLET (1000 MG TOTAL) BY MOUTHTWO TIMES DAILY WITH A MEAL.  . metoprolol tartrate (LOPRESSOR) 50 MG tablet Take 1 tablet (50 mg total) by mouth 2 (two) times daily.  Marland Kitchen omeprazole (PRILOSEC) 20 MG capsule TAKE ONE CAPSULE BY MOUTH DAILY  . rosuvastatin (CRESTOR) 20 MG tablet TAKE ONE TABLET (20MG  TOTAL) BY MOUTH DAILY  . spironolactone (ALDACTONE) 25 MG tablet TAKE ONE (1) TABLET BY MOUTH EVERY DAY  . UNABLE TO FIND Bedside commode x 1  Dx m76.9, m54.4  . UNABLE TO FIND Shower bench x 1 Shower grab bar x 1  Dx: m76.9, m54.5  . [DISCONTINUED] Insulin Degludec (TRESIBA FLEXTOUCH) 200 UNIT/ML SOPN Inject 70 Units into the skin at bedtime.   No  facility-administered encounter medications on file as of 08/09/2018.     ALLERGIES: Allergies  Allergen Reactions  . Haloperidol Lactate Anaphylaxis  . Lipitor [Atorvastatin Calcium] Other (See Comments)    Markedly elevated liver enzymes    VACCINATION STATUS: Immunization History  Administered Date(s) Administered  . Influenza,inj,Quad PF,6+ Mos 04/01/2015, 03/16/2016, 01/20/2017, 04/11/2018  . Pneumococcal Polysaccharide-23 05/13/2015    Diabetes  She presents for her follow-up diabetic visit. She has type 2 diabetes mellitus. Onset time: He was diagnosed at approximate age of 17 years. Her disease course has been worsening. There are no hypoglycemic associated symptoms. Pertinent negatives for hypoglycemia include no confusion, headaches, pallor or seizures. Associated symptoms include blurred vision, fatigue, polydipsia and polyuria. Pertinent negatives for diabetes include no chest pain and no polyphagia. There are no hypoglycemic complications. Symptoms are worsening. There are no diabetic complications. Risk factors for coronary artery disease include diabetes mellitus, dyslipidemia, family history, hypertension, obesity, post-menopausal and sedentary lifestyle. She is following a generally unhealthy diet. When asked about meal planning, she reported none. She has had a previous visit with a dietitian. She never participates in exercise. Her breakfast blood glucose range is generally >200 mg/dl. Her overall blood glucose range is >200 mg/dl. (She did not bring any meter nor logs to review.  She reports her glycemic profile is persistently above 300 mg per DL.  Her most recent labs show A1c of greater than 14%.) An ACE inhibitor/angiotensin II receptor blocker is not being taken. She does not see a podiatrist.Eye exam is not current.  Hyperlipidemia  This is a chronic problem. The current episode started more than 1 year ago. The problem is uncontrolled. Exacerbating diseases include  diabetes and obesity. Pertinent negatives include no chest pain, myalgias or shortness of breath. Current antihyperlipidemic treatment includes statins. Risk factors for coronary artery disease include diabetes mellitus, dyslipidemia, hypertension, obesity, a sedentary lifestyle and post-menopausal.  Hypertension  Associated symptoms include blurred vision. Pertinent negatives include no chest pain, headaches, palpitations or shortness of breath. Risk factors for coronary artery disease include dyslipidemia, diabetes mellitus, obesity, sedentary lifestyle and post-menopausal state. Past treatments include calcium channel blockers and beta blockers.     Objective:  BP (!) 142/84   Pulse 96   Ht 5\' 10"  (1.778 m)   Wt (!) 380 lb (172.4 kg)   BMI 54.52 kg/m   Wt Readings from Last 3 Encounters:  08/09/18 (!) 380 lb (172.4 kg)  05/06/18 (!) 379 lb (171.9 kg)  04/11/18 (!) 381 lb 1.3 oz (172.9 kg)       CMP     Component Value Date/Time   NA 133 (L) 08/04/2018 1502   K 4.5 08/04/2018 1502   CL 96 (L) 08/04/2018 1502   CO2 22 08/04/2018 1502   GLUCOSE 336 (H) 08/04/2018 1502   BUN 8 08/04/2018 1502   CREATININE 0.79 08/04/2018 1502   CALCIUM 9.4 08/04/2018 1502   PROT 7.5 08/04/2018 1502   ALBUMIN 3.9 07/13/2016 1430   AST 37 (H) 08/04/2018 1502   ALT 28 08/04/2018 1502   ALKPHOS 61 07/13/2016 1430   BILITOT 0.7 08/04/2018 1502   GFRNONAA 81 08/04/2018 1502   GFRAA 94 08/04/2018 1502     Diabetic Labs (most recent): Lab Results  Component Value Date   HGBA1C >14.0 (H) 08/04/2018   HGBA1C 10.5 (H) 03/23/2018   HGBA1C 9.3 (H) 11/01/2017     Lipid Panel ( most recent) Lipid Panel     Component Value Date/Time   CHOL 183 01/27/2018 1445   TRIG 108 01/27/2018 1445   HDL 40 (L) 01/27/2018 1445   CHOLHDL 4.6 01/27/2018 1445   VLDL 23 07/13/2016 1430   LDLCALC 121 (H) 01/27/2018 1445      Lab Results  Component Value Date   TSH 1.23 05/09/2018   TSH 0.95  07/13/2016   TSH 1.26 07/10/2015   TSH 1.382 11/17/2013   TSH 0.519 04/14/2012   TSH 1.525 11/07/2009      Lipid Panel     Component Value Date/Time   CHOL 183 01/27/2018 1445   TRIG 108 01/27/2018 1445   HDL 40 (L) 01/27/2018 1445   CHOLHDL 4.6 01/27/2018 1445   VLDL 23 07/13/2016 1430   LDLCALC 121 (H) 01/27/2018 1445     Assessment & Plan:   1. Uncontrolled type 2 diabetes mellitus with hyperglycemia ([place])  - Tracey [name] has currently uncontrolled symptomatic type 2 DM since 63 years of age. -She reports persistent hyperglycemia of 300+ x 1 week due to yeast infection. She has not done her pre-visit labs. Her  a1c was high at  10.5% in December 2019, increasing from 9.3%      -her diabetes is complicated by obesity/sedentary life and Tracey [name] remains at a high risk for more acute and chronic complications which include CAD, CVA, CKD, retinopathy, and neuropathy. These are all discussed in detail with the patient.  - I have counseled her on diet management and weight loss, by adopting a carbohydrate restricted/protein rich diet.  - Patient admits there is a room for improvement in her diet and drink choices. -  Suggestion is made for her to avoid simple carbohydrates  from her diet including Cakes, Sweet Desserts / Pastries, Ice Cream, Soda (diet and regular), Sweet Tea, Candies, Chips, Cookies, Store Bought Juices, Alcohol in Excess of  1-2 drinks a day, Artificial Sweeteners, and "Sugar-free" Products. This will help patient to have stable blood glucose profile and potentially avoid unintended weight gain.   - I encouraged her to switch to  unprocessed or minimally processed complex starch and increased protein intake (animal or plant source), fruits, and vegetables.  - she is advised to  stick to a routine mealtimes to eat 3 meals  a day and avoid unnecessary snacks ( to snack only to correct hypoglycemia).   - she is following with  Jearld Fenton, RDN,  CDE for individualized diabetes education.  - I have approached her with the following individualized plan to manage diabetes and patient agrees:   - Based on her  Report of persistent  Hyperglycemia, she will need higher dose of insulin. She may need intensive therapy with basal/bolus insulin, but she has room on her basal insulin before considering bolus insulin.  -She is advised to increase her Tresiba to 80 units nightly, associated with strict monitoring of blood glucose 4 times a day-daily before meals and at bedtime.   -Patient is encouraged to call clinic for blood glucose levels less than 70 or above 200 mg /dl. -She is advised to continue metformin 1000 mg p.o. twice daily. -She will likely require prandial insulin, advised to return in 2 weeks with her meter and logs.     2) BP/HTN: Her blood pressure is not controlled to target. she is advised to home monitor blood pressure and report if > 140/90 on 2 separate readings.  She is advised to continue her current medications including spironolactone, metoprolol, amlodipine.   3) Lipids/HPL: Her recent lipid panel showed uncontrolled LDL of 166.   She is advised to continue Crestor 10 mg p.o. nightly.   She will need fasting lipid panel on subsequent visits.   4) Chronic Care/Health Maintenance:  -she  Is on Statin medications and  is encouraged to continue to follow up with Ophthalmology, Dentist,  Podiatrist at least yearly or according to recommendations, and advised to   stay away from smoking. I have recommended yearly flu vaccine and pneumonia vaccination at least every 5 years; moderate intensity exercise for up to 150 minutes weekly; and  sleep for at least 7 hours a day.  - I advised patient to maintain close follow up with Fayrene Helper, MD for primary care needs.  - Time spent with the patient: 25 min, of which >50% was spent in reviewing her blood glucose logs , discussing her hypoglycemia and hyperglycemia  episodes, reviewing her current and  previous labs / studies and medications  doses and developing a plan to avoid hypoglycemia and hyperglycemia. Please refer to Patient Instructions for Blood Glucose Monitoring and Insulin/Medications Dosing Guide"  in media tab for additional information. Please  also refer to " Patient Self Inventory" in the Media  tab for reviewed elements of pertinent patient history.  Duane Boston participated in the discussions, expressed understanding, and voiced agreement with the above plans.  All questions were answered to her satisfaction. she is encouraged to contact clinic should she have any questions or concerns prior to her return visit.  Follow up plan: - Return in about 2 weeks (around 08/23/2018) for Follow up with Meter and Logs Only - no Labs.  Glade Lloyd, MD [facility] [address] [place], [place] [address] Phone: [phone]  Fax: [phone]    08/09/2018, 11:49 AM  This note was partially dictated with voice recognition software. Similar sounding words can be transcribed inadequately or may not  be corrected upon review.

## 2018-08-09 NOTE — Patient Instructions (Addendum)
Wellness with  Nurse due in August 15 or after, please schedule  Please schedule mammogram and mail appointment  F/U with MD in 4.5 months, please schedule   Please collect tablets fluconazole prescribed for your infection, and also new topical cream is prescribed  It is important that you exercise regularly at least 30 minutes 5 times a week. If you develop chest pain, have severe difficulty breathing, or feel very tired, stop exercising immediately and seek medical attention    Think about what you will eat, plan ahead. Choose " clean, green, fresh or frozen" over canned, processed or packaged foods which are more sugary, salty and fatty. 70 to 75% of food eaten should be vegetables and fruit. Three meals at set times with snacks allowed between meals, but they must be fruit or vegetables. Aim to eat over a 12 hour period , example 7 am to 7 pm, and STOP after  your last meal of the day. Drink water,generally about 64 ounces per day, no other drink is as healthy. Fruit juice is best enjoyed in a healthy way, by EATING the fruit.  Thanks for choosing [facility], we consider it a privelige to serve you.

## 2018-08-10 ENCOUNTER — Encounter: Payer: Medicare Other | Admitting: Nutrition

## 2018-08-10 DIAGNOSIS — IMO0002 Reserved for concepts with insufficient information to code with codable children: Secondary | ICD-10-CM

## 2018-08-10 DIAGNOSIS — E1165 Type 2 diabetes mellitus with hyperglycemia: Secondary | ICD-10-CM

## 2018-08-10 DIAGNOSIS — E118 Type 2 diabetes mellitus with unspecified complications: Principal | ICD-10-CM

## 2018-08-10 NOTE — Progress Notes (Signed)
Follow up phone visit  Nutrition Therapy  Visit Type:    f/u  Appt. Start Time: 1430 Appt. End Time: 1500 08/10/2018  [name], identified by name and date of birth, is a 63 y.o. female with a diagnosis of Diabetes Type 2:  . She talked to Dr. Moshe Cipro and got antibiotic for yeast infection.  Dr. Dorris Fetch increased insulin to 80 units of Tresiba. Takes it nightly.  Metformin 1000 mg BID. A1C > 14%. She reports she takes her insulin as prescribed.  She notes she has to test her BS 4 times a day for the next week and see dr. Dorris Fetch again. FBS 370, BL 350   Last night 397, Lacks adequate access to fresh fruits and vegetables.  Limited mobility due to her size and now the yeast infection. She most likely will need basal bolus insulin to better control her DM.  She would benefit from a Libre after on Basal bolus. ASSESSMENT  B) Egg and 1 slice toast and water. L) Hamburger with bun, water D) 6" sub- chicken teriacki sub. Water,      Lab Results  Component Value Date   HGBA1C 9.3 (H) 11/01/2017   CMP Latest Ref Rng & Units 08/04/2018 01/27/2018 11/01/2017  Glucose 65 - 139 mg/dL 336(H) 222(H) 254(H)  BUN 7 - 25 mg/dL 8 8 12   Creatinine 0.50 - 0.99 mg/dL 0.79 0.80 0.75  Sodium 135 - 146 mmol/L 133(L) 136 138  Potassium 3.5 - 5.3 mmol/L 4.5 4.4 4.2  Chloride 98 - 110 mmol/L 96(L) 100 102  CO2 20 - 32 mmol/L 22 24 23   Calcium 8.6 - 10.4 mg/dL 9.4 9.0 9.2  Total Protein 6.1 - 8.1 g/dL 7.5 7.6 7.7  Total Bilirubin 0.2 - 1.2 mg/dL 0.7 0.6 0.5  Alkaline Phos 33 - 130 U/L - - -  AST 10 - 35 U/L 37(H) 27 31  ALT 6 - 29 U/L 28 25 32(H)   B) Ham, cheese egg biscuit, Coffee  1 cup orange juice. L) Baled chicken with green beans, water D) Baked chicken with green beans, water 1 slice bread,,   Learning Objective:  Patient will have a greater understanding of diabetes self-management. Patient education plan is to attend individual and/or group sessions per assessed needs and  concerns.   .Goal Drink 2 16 oz bottles of water with each meal.  Eat 2 vegetables with lunch and dinner Don't skip meals Eat a  piece of fruit with each meal Test blood sugar 4 times per day. Take Insulin as prescribed. Cut out orange juice. Walk 15 minutes a day Eat yogurt daily to help with yeast infection.   Expected Outcomes:    Improved self management of her DM.  Education material provided: My Plate and Carbohydrate counting sheet  If problems or questions, patient to contact team via:  Phone and Email  Future DSME appointment: -   f/u 2 weeks.  Will mostly likely need basal bolus insulin. Would benefit from a Libra after that for btetter assessment of glucose patterns and control.

## 2018-08-13 NOTE — Assessment & Plan Note (Signed)
Uncontrolled , will adjust med at next in office visit if this remains the case DASH diet and commitment to daily physical activity for a minimum of 30 minutes discussed and encouraged, as a part of hypertension management. The importance of attaining a healthy weight is also discussed.  BP/Weight 08/09/2018 08/09/2018 05/06/2018 04/11/2018 03/29/2018 80/09/9994 10/13/2771  Systolic BP 750 510 712 524 799 - -  Diastolic BP 84 84 80 78 88 - -  Wt. (Lbs) 380 380 379 381.08 384 387 384  BMI 54.52 54.52 54.38 54.68 55.1 55.53 55.1

## 2018-08-13 NOTE — Assessment & Plan Note (Signed)
  Unchanged Patient re-educated about  the importance of commitment to a  minimum of 150 minutes of exercise per week as able.  The importance of healthy food choices with portion control discussed, as well as eating regularly and within a 12 hour window most days. The need to choose "clean , green" food 50 to 75% of the time is discussed, as well as to make water the primary drink and set a goal of 64 ounces water daily.  Encouraged to start a food diary,  and to consider  joining a support group. Sample diet sheets offered. Goals set by the patient for the next several months.   Weight /BMI 08/09/2018 08/09/2018 05/06/2018  WEIGHT 380 lb 380 lb 379 lb  HEIGHT 5\' 10"  5\' 10"  5\' 10"   BMI 54.52 kg/m2 54.52 kg/m2 54.38 kg/m2

## 2018-08-14 ENCOUNTER — Encounter: Payer: Self-pay | Admitting: Family Medicine

## 2018-08-14 DIAGNOSIS — B3731 Acute candidiasis of vulva and vagina: Secondary | ICD-10-CM | POA: Insufficient documentation

## 2018-08-14 DIAGNOSIS — B373 Candidiasis of vulva and vagina: Secondary | ICD-10-CM | POA: Insufficient documentation

## 2018-08-14 NOTE — Assessment & Plan Note (Signed)
Menaged by Endo and remain uncontrolled and has deteriorated.. Pt needs to change food choice and is still not doing this Tracey [name] is reminded of the importance of commitment to daily physical activity for 30 minutes or more, as able and the need to limit carbohydrate intake to 30 to 60 grams per meal to help with blood sugar control.   The need to take medication as prescribed, test blood sugar as directed, and to call between visits if there is a concern that blood sugar is uncontrolled is also discussed.   Tracey [name] is reminded of the importance of daily foot exam, annual eye examination, and good blood sugar, blood pressure and cholesterol control.  Diabetic Labs Latest Ref Rng & Units 08/04/2018 04/12/2018 03/23/2018 01/27/2018 11/01/2017  HbA1c <5.7 % of total Hgb >14.0(H) - 10.5(H) - 9.3(H)  Microalbumin Not Estab. ug/mL - 63.8(H) - - -  Micro/Creat Ratio 0.0 - 30.0 mg/g creat - 27.8 - - -  Chol <200 mg/dL - - - 183 -  HDL >50 mg/dL - - - 40(L) -  Calc LDL mg/dL (calc) - - - 121(H) -  Triglycerides <150 mg/dL - - - 108 -  Creatinine 0.50 - 0.99 mg/dL 0.79 - - 0.80 0.75   BP/Weight 08/09/2018 08/09/2018 05/06/2018 04/11/2018 03/29/2018 18/05/9935 04/18/9676  Systolic BP 938 101 751 025 852 - -  Diastolic BP 84 84 80 78 88 - -  Wt. (Lbs) 380 380 379 381.08 384 387 384  BMI 54.52 54.52 54.38 54.68 55.1 55.53 55.1   Foot/eye exam completion dates Latest Ref Rng & Units 08/19/2016 08/19/2016  Eye Exam No Retinopathy No Retinopathy No Retinopathy  Foot Form Completion - - -

## 2018-08-14 NOTE — Assessment & Plan Note (Signed)
Recurrent and chronic problem , worsened with deterioration in blood sugar control Explained connection to pt Topical and oral medication is prescribed

## 2018-08-14 NOTE — Assessment & Plan Note (Signed)
Hyperlipidemia:Low fat diet discussed and encouraged.   Lipid Panel  Lab Results  Component Value Date   CHOL 183 01/27/2018   HDL 40 (L) 01/27/2018   LDLCALC 121 (H) 01/27/2018   TRIG 108 01/27/2018   CHOLHDL 4.6 01/27/2018   Not at goal Updated lab needed at/ before next visit.

## 2018-08-17 ENCOUNTER — Encounter: Payer: Self-pay | Admitting: Nutrition

## 2018-08-17 ENCOUNTER — Telehealth: Payer: Self-pay | Admitting: Nutrition

## 2018-08-17 NOTE — Patient Instructions (Addendum)
.  Goal Drink 2 16 oz bottles of water with each meal  Eat 2 vegetables with lunch and dinner Don't skip meals Eat a  piece of fruit with each meal Test blood sugar 4 times a day. Take insulin as prescribed Cut out orange juice. Walk 15 minutes a day Eat yogurt daily to help with yeast infection.

## 2018-08-19 ENCOUNTER — Other Ambulatory Visit: Payer: Self-pay | Admitting: Family Medicine

## 2018-08-23 ENCOUNTER — Encounter: Payer: Self-pay | Admitting: Nutrition

## 2018-08-23 ENCOUNTER — Encounter: Payer: Medicare Other | Attending: Family Medicine | Admitting: Nutrition

## 2018-08-23 ENCOUNTER — Other Ambulatory Visit: Payer: Self-pay

## 2018-08-23 ENCOUNTER — Ambulatory Visit: Payer: Medicare Other | Admitting: "Endocrinology

## 2018-08-23 DIAGNOSIS — E1165 Type 2 diabetes mellitus with hyperglycemia: Secondary | ICD-10-CM

## 2018-08-23 DIAGNOSIS — E118 Type 2 diabetes mellitus with unspecified complications: Secondary | ICD-10-CM | POA: Insufficient documentation

## 2018-08-23 DIAGNOSIS — IMO0002 Reserved for concepts with insufficient information to code with codable children: Secondary | ICD-10-CM

## 2018-08-23 NOTE — Progress Notes (Signed)
Follow up phone visit  Nutrition Therapy  Visit Type:    f/u  Appt. Start Time: 1520 Appt. End Time: 1600 08/23/2018  [name], identified by name and date of birth, is a 63 y.o. female with a diagnosis of Diabetes Type 2:  .80 units [place]. Finished her antibiotic. Yeast infection is gone. Feeling better. Has a problem with sweets.   BS starting May 1-  :                BB, 371, 264, 268, 223, 337, 343, 354, 332                  BL   253, 291, 210, 249, 343, 341, 306, 318                  BS   364, 302, -       314, 341, 314,  363                  Bedtimes: 348, 390, -  -    328, - - -  B) 2 Boiled egg, 2 slices toast, 1 cup  2% milk, water No snack L) skipped today:   [place] sandwich with mustard, water, 1 cup yogurt usually D)  6" tuna sub, water,  Or Fish flounder fried,, broccoli, 2 fries, water  Drinking more water. Still struggles with eating sweets. Doesn't walk much due to her legs giving out. Usually Rides buggy carts in stores.  Lacks commitment to making consistent changes with diet. Tends to skip meals often and then overeats and snacks. Lacks adequate access to healthy foods due to limited income.  Lacks adequate access to fresh fruits and vegetables.    ASSESSMENT  Needs on going support to make changes with diet and exercise.   Lab Results  Component Value Date   HGBA1C >14.0 (H) 08/04/2018      CMP Latest Ref Rng & Units 08/04/2018 01/27/2018 11/01/2017  Glucose 65 - 139 mg/dL 336(H) 222(H) 254(H)  BUN 7 - 25 mg/dL 8 8 12   Creatinine 0.50 - 0.99 mg/dL 0.79 0.80 0.75  Sodium 135 - 146 mmol/L 133(L) 136 138  Potassium 3.5 - 5.3 mmol/L 4.5 4.4 4.2  Chloride 98 - 110 mmol/L 96(L) 100 102  CO2 20 - 32 mmol/L 22 24 23   Calcium 8.6 - 10.4 mg/dL 9.4 9.0 9.2  Total Protein 6.1 - 8.1 g/dL 7.5 7.6 7.7  Total Bilirubin 0.2 - 1.2 mg/dL 0.7 0.6 0.5  Alkaline Phos 33 - 130 U/L - - -  AST 10 - 35 U/L 37(H) 27 31  ALT 6 - 29 U/L 28 25 32(H)   B) Ham,  cheese egg biscuit, Coffee  1 cup orange juice. L) Baled chicken with green beans, water D) Baked chicken with green beans, water 1 slice bread,,   Learning Objective:  Patient will have a greater understanding of diabetes self-management. Patient education plan is to attend individual and/or group sessions per assessed needs and concerns.   .Goal Drink 2 16 oz bottles of water with each meal.  Eat 2 vegetables with lunch and dinner Don't skip meals Eat a  piece of fruit with each meal Test blood sugar 4 times per day. Take Insulin as prescribed. Cut out orange juice. Walk 15 minutes a day Eat yogurt daily to help with yeast infection.   Expected Outcomes:    Improved self management of her DM.  Education material provided: My Plate  and Carbohydrate counting sheet  If problems or questions, patient to contact team via:  Phone and Email  Future DSME appointment: -   f/u 1 month. She would benefit from MDI's to help control blood sugars better.

## 2018-08-23 NOTE — Patient Instructions (Signed)
.  Goal Drink 2 16 oz bottles of water with each meal.  Eat 2 vegetables with lunch and dinner Don't skip meals Eat a  piece of fruit with each meal Test blood sugar 4 times per day. Take Insulin as prescribed. Cut out orange juice. Walk 15 minutes a day Eat yogurt daily to help with yeast infection.

## 2018-08-24 ENCOUNTER — Ambulatory Visit (INDEPENDENT_AMBULATORY_CARE_PROVIDER_SITE_OTHER): Payer: Medicare Other | Admitting: "Endocrinology

## 2018-08-24 ENCOUNTER — Encounter: Payer: Self-pay | Admitting: "Endocrinology

## 2018-08-24 DIAGNOSIS — E1165 Type 2 diabetes mellitus with hyperglycemia: Secondary | ICD-10-CM

## 2018-08-24 DIAGNOSIS — I10 Essential (primary) hypertension: Secondary | ICD-10-CM | POA: Diagnosis not present

## 2018-08-24 DIAGNOSIS — E782 Mixed hyperlipidemia: Secondary | ICD-10-CM | POA: Diagnosis not present

## 2018-08-24 MED ORDER — INSULIN DEGLUDEC 200 UNIT/ML ~~LOC~~ SOPN: 100.0000 [IU] | PEN_INJECTOR | Freq: Every day | SUBCUTANEOUS | 2 refills | Status: DC

## 2018-08-24 MED ORDER — GLIPIZIDE ER 5 MG PO TB24: 5.0000 mg | ORAL_TABLET | Freq: Every day | ORAL | 3 refills | Status: DC

## 2018-08-24 NOTE — Progress Notes (Signed)
08/24/2018, 12:33 PM Endocrinology follow-up note  Subjective:    Patient ID: Tracey [name], female    DOB: 08-May-1955.  Tracey [name] is being seen in follow-up  for management of currently uncontrolled symptomatic type 2 diabetes, hyperlipidemia, hypertension. PMD:   Fayrene Helper, MD.  Past Medical History:  Diagnosis Date  . Arthritis   . Diabetes mellitus without complication ([place])   . Diastolic dysfunction 04/4429   Grade 1  . GERD (gastroesophageal reflux disease)   . Hyperlipidemia   . Hyperlipidemia   . Hypertension 04/15/2012  . Morbid obesity ([place])   . PE (pulmonary embolism)   . Pulmonary emboli ([place]) 1999   post TAH  . Sleep apnea    Past Surgical History:  Procedure Laterality Date  . ABDOMINAL HYSTERECTOMY  1999   , totalfor fibroids  . CESAREAN SECTION    . CHOLECYSTECTOMY  1996  . COLONOSCOPY N/A 10/31/2015   Procedure: COLONOSCOPY;  Surgeon: Rogene Houston, MD;  Location: [facility];  Service: Endoscopy;  Laterality: N/A;  930   Social History   Socioeconomic History  . Marital status: Single    Spouse name: Not on file  . Number of children: Not on file  . Years of education: Not on file  . Highest education level: Not on file  Occupational History  . Not on file  Social Needs  . Financial resource strain: Somewhat hard  . Food insecurity:    Worry: Often true    Inability: Often true  . Transportation needs:    Medical: No    Non-medical: No  Tobacco Use  . Smoking status: Never Smoker  . Smokeless tobacco: Never Used  Substance and Sexual Activity  . Alcohol use: No    Alcohol/week: 0.0 standard drinks  . Drug use: No  . Sexual activity: Not Currently  Lifestyle  . Physical activity:    Days per week: 0 days    Minutes per session: 0 min  . Stress: To some extent  Relationships  . Social connections:     Talks on phone: More than three times a week    Gets together: More than three times a week    Attends religious service: Never    Active member of club or organization: No    Attends meetings of clubs or organizations: Never    Relationship status: Divorced  Other Topics Concern  . Not on file  Social History Narrative  . Not on file   Outpatient Encounter Medications as of 08/24/2018  Medication Sig  . ACCU-CHEK SOFTCLIX LANCETS lancets Use as instructed 4x daily dx E11.65  . aspirin EC 81 MG tablet Take 1 tablet (81 mg total) by mouth daily.  . clotrimazole (GYNE-LOTRIMIN 3) 2 % vaginal cream Place 1 Applicatorful vaginally at bedtime.  . ergocalciferol (VITAMIN D2) 50000 units capsule Take 1 capsule (50,000 Units total) by mouth once a week. One capsule once weekly  .  glipiZIDE (GLUCOTROL XL) 5 MG 24 hr tablet Take 1 tablet (5 mg total) by mouth daily with breakfast.  . glucose blood (ACCU-CHEK AVIVA) test strip Use as instructed 4 x daily dx E11.65  . ibuprofen (ADVIL,MOTRIN) 200 MG tablet Take 200 mg by mouth every 8 (eight) hours as needed for moderate pain.   . Insulin Degludec (TRESIBA FLEXTOUCH) 200 UNIT/ML SOPN Inject 100 Units into the skin at bedtime.  . Insulin Pen Needle (B-D ULTRAFINE III SHORT PEN) 31G X 8 MM MISC 1 each by Does not apply route as directed.  . metFORMIN (GLUCOPHAGE) 1000 MG tablet TAKE ONE TABLET (1000 MG TOTAL) BY MOUTHTWO TIMES DAILY WITH A MEAL.  . metoprolol tartrate (LOPRESSOR) 50 MG tablet Take 1 tablet (50 mg total) by mouth 2 (two) times daily.  Marland Kitchen omeprazole (PRILOSEC) 20 MG capsule TAKE ONE CAPSULE BY MOUTH DAILY  . rosuvastatin (CRESTOR) 20 MG tablet TAKE ONE TABLET (20MG  TOTAL) BY MOUTH DAILY  . spironolactone (ALDACTONE) 25 MG tablet TAKE ONE (1) TABLET BY MOUTH EVERY DAY  . UNABLE TO FIND Bedside commode x 1  Dx m76.9, m54.4  . UNABLE TO FIND Shower bench x 1 Shower grab bar x 1  Dx: m76.9, m54.5  . [DISCONTINUED] Insulin Degludec  (TRESIBA FLEXTOUCH) 200 UNIT/ML SOPN Inject 80 Units into the skin at bedtime.   No facility-administered encounter medications on file as of 08/24/2018.     ALLERGIES: Allergies  Allergen Reactions  . Haloperidol Lactate Anaphylaxis  . Lipitor [Atorvastatin Calcium] Other (See Comments)    Markedly elevated liver enzymes    VACCINATION STATUS: Immunization History  Administered Date(s) Administered  . Influenza,inj,Quad PF,6+ Mos 04/01/2015, 03/16/2016, 01/20/2017, 04/11/2018  . Pneumococcal Polysaccharide-23 05/13/2015    Diabetes  She presents for her follow-up diabetic visit. She has type 2 diabetes mellitus. Onset time: He was diagnosed at approximate age of 55 years. Her disease course has been worsening. There are no hypoglycemic associated symptoms. Pertinent negatives for hypoglycemia include no confusion, headaches, pallor or seizures. Associated symptoms include blurred vision, fatigue, polydipsia and polyuria. Pertinent negatives for diabetes include no chest pain and no polyphagia. There are no hypoglycemic complications. Symptoms are worsening. There are no diabetic complications. Risk factors for coronary artery disease include diabetes mellitus, dyslipidemia, family history, hypertension, obesity, post-menopausal and sedentary lifestyle. She is following a generally unhealthy diet. When asked about meal planning, she reported none. She has had a previous visit with a dietitian. She never participates in exercise. Her home blood glucose trend is increasing steadily. Her breakfast blood glucose range is generally >200 mg/dl. Her lunch blood glucose range is generally >200 mg/dl. Her dinner blood glucose range is generally >200 mg/dl. Her bedtime blood glucose range is generally >200 mg/dl. Her overall blood glucose range is >200 mg/dl. (  Her most recent labs show A1c of greater than 14%.) An ACE inhibitor/angiotensin II receptor blocker is not being taken. She does not see a  podiatrist.Eye exam is not current.  Hyperlipidemia  This is a chronic problem. The current episode started more than 1 year ago. The problem is uncontrolled. Exacerbating diseases include diabetes and obesity. Pertinent negatives include no chest pain, myalgias or shortness of breath. Current antihyperlipidemic treatment includes statins. Risk factors for coronary artery disease include diabetes mellitus, dyslipidemia, hypertension, obesity, a sedentary lifestyle and post-menopausal.  Hypertension  Associated symptoms include blurred vision. Pertinent negatives include no chest pain, headaches, palpitations or shortness of breath. Risk factors for coronary artery disease include  dyslipidemia, diabetes mellitus, obesity, sedentary lifestyle and post-menopausal state. Past treatments include calcium channel blockers and beta blockers.     Objective:    There were no vitals taken for this visit.  Wt Readings from Last 3 Encounters:  08/09/18 (!) 380 lb (172.4 kg)  08/09/18 (!) 380 lb (172.4 kg)  05/06/18 (!) 379 lb (171.9 kg)       CMP     Component Value Date/Time   NA 133 (L) 08/04/2018 1502   K 4.5 08/04/2018 1502   CL 96 (L) 08/04/2018 1502   CO2 22 08/04/2018 1502   GLUCOSE 336 (H) 08/04/2018 1502   BUN 8 08/04/2018 1502   CREATININE 0.79 08/04/2018 1502   CALCIUM 9.4 08/04/2018 1502   PROT 7.5 08/04/2018 1502   ALBUMIN 3.9 07/13/2016 1430   AST 37 (H) 08/04/2018 1502   ALT 28 08/04/2018 1502   ALKPHOS 61 07/13/2016 1430   BILITOT 0.7 08/04/2018 1502   GFRNONAA 81 08/04/2018 1502   GFRAA 94 08/04/2018 1502     Diabetic Labs (most recent): Lab Results  Component Value Date   HGBA1C >14.0 (H) 08/04/2018   HGBA1C 10.5 (H) 03/23/2018   HGBA1C 9.3 (H) 11/01/2017     Lipid Panel ( most recent) Lipid Panel     Component Value Date/Time   CHOL 183 01/27/2018 1445   TRIG 108 01/27/2018 1445   HDL 40 (L) 01/27/2018 1445   CHOLHDL 4.6 01/27/2018 1445   VLDL 23  07/13/2016 1430   LDLCALC 121 (H) 01/27/2018 1445      Lab Results  Component Value Date   TSH 1.23 05/09/2018   TSH 0.95 07/13/2016   TSH 1.26 07/10/2015   TSH 1.382 11/17/2013   TSH 0.519 04/14/2012   TSH 1.525 11/07/2009      Lipid Panel     Component Value Date/Time   CHOL 183 01/27/2018 1445   TRIG 108 01/27/2018 1445   HDL 40 (L) 01/27/2018 1445   CHOLHDL 4.6 01/27/2018 1445   VLDL 23 07/13/2016 1430   LDLCALC 121 (H) 01/27/2018 1445     Assessment & Plan:   1. Uncontrolled type 2 diabetes mellitus with hyperglycemia ([place])  - Ingri D Ryle has currently uncontrolled symptomatic type 2 DM since 63 years of age. -She reports slightly improving but still  persistent hyperglycemia of 250. Her recent labs show a1c of >14%.  -her diabetes is complicated by obesity/sedentary life and CHASITEE ZENKER remains at a high risk for more acute and chronic complications which include CAD, CVA, CKD, retinopathy, and neuropathy. These are all discussed in detail with the patient.  - I have counseled her on diet management and weight loss, by adopting a carbohydrate restricted/protein rich diet.  - Patient admits there is a room for improvement in her diet and drink choices. -  Suggestion is made for her to avoid simple carbohydrates  from her diet including Cakes, Sweet Desserts / Pastries, Ice Cream, Soda (diet and regular), Sweet Tea, Candies, Chips, Cookies, Store Bought Juices, Alcohol in Excess of  1-2 drinks a day, Artificial Sweeteners, and "Sugar-free" Products. This will help patient to have stable blood glucose profile and potentially avoid unintended weight gain.  - I encouraged her to switch to  unprocessed or minimally processed complex starch and increased protein intake (animal or plant source), fruits, and vegetables.  - she is advised to stick to a routine mealtimes to eat 3 meals  a day and avoid unnecessary snacks (  to snack only to correct hypoglycemia).    - she is following with  Jearld Fenton, RDN, CDE for individualized diabetes education.  - I have approached her with the following individualized plan to manage diabetes and patient agrees:   - Based on her  Report of persistent  Hyperglycemia, she will need higher dose of insulin. She may need intensive therapy with basal/bolus insulin, but she has room on her basal insulin before considering bolus insulin.  -She is advised to increase her Tresiba to 100 units nightly, associated with strict monitoring of blood glucose 4 times a day-daily before meals and at bedtime.   -Patient is encouraged to call clinic for blood glucose levels less than 70 or above 200 mg /dl. -She is advised to continue metformin 1000 mg p.o. twice daily. -She will likely require prandial insulin, advised to return in 2 weeks with her meter and logs. - I discussed and added Glipizide 5mg  XL po daily with breakfast.   2) BP/HTN: she is advised to home monitor blood pressure and report if > 140/90 on 2 separate readings.   She is advised to continue her current medications including spironolactone, metoprolol, amlodipine.   3) Lipids/HPL: Her recent lipid panel showed uncontrolled LDL of 166.   She is advised to continue Crestor 10 mg p.o. nightly.   She will need fasting lipid panel on subsequent visits.   4) Chronic Care/Health Maintenance:  -she  is on Statin medications and  is encouraged to continue to follow up with Ophthalmology, Dentist,  Podiatrist at least yearly or according to recommendations, and advised to   stay away from smoking. I have recommended yearly flu vaccine and pneumonia vaccination at least every 5 years; moderate intensity exercise for up to 150 minutes weekly; and  sleep for at least 7 hours a day.  - I advised patient to maintain close follow up with Fayrene Helper, MD for primary care needs.  - Time spent with the patient: 25 min, of which >50% was spent in reviewing her blood  glucose logs , discussing her hypoglycemia and hyperglycemia episodes, reviewing her current and  previous labs / studies and medications  doses and developing a plan to avoid hypoglycemia and hyperglycemia. Please refer to Patient Instructions for Blood Glucose Monitoring and Insulin/Medications Dosing Guide"  in media tab for additional information. Please  also refer to " Patient Self Inventory" in the Media  tab for reviewed elements of pertinent patient history.  Duane Boston participated in the discussions, expressed understanding, and voiced agreement with the above plans.  All questions were answered to her satisfaction. she is encouraged to contact clinic should she have any questions or concerns prior to her return visit.   Follow up plan: - Return in about 2 weeks (around 09/07/2018) for Follow up with Meter and Logs Only - no Labs.  Glade Lloyd, MD [facility] [address] [place], [place] [address] Phone: [phone]  Fax: [phone]    08/24/2018, 12:33 PM  This note was partially dictated with voice recognition software. Similar sounding words can be transcribed inadequately or may not  be corrected upon review.

## 2018-08-29 ENCOUNTER — Encounter: Payer: Self-pay | Admitting: Nutrition

## 2018-09-06 NOTE — Telephone Encounter (Signed)
No answer for follow up telephone visit.

## 2018-09-07 ENCOUNTER — Ambulatory Visit (INDEPENDENT_AMBULATORY_CARE_PROVIDER_SITE_OTHER): Payer: Medicare Other | Admitting: "Endocrinology

## 2018-09-07 ENCOUNTER — Encounter: Payer: Self-pay | Admitting: "Endocrinology

## 2018-09-07 ENCOUNTER — Other Ambulatory Visit: Payer: Self-pay

## 2018-09-07 DIAGNOSIS — E782 Mixed hyperlipidemia: Secondary | ICD-10-CM

## 2018-09-07 DIAGNOSIS — I10 Essential (primary) hypertension: Secondary | ICD-10-CM

## 2018-09-07 DIAGNOSIS — E1165 Type 2 diabetes mellitus with hyperglycemia: Secondary | ICD-10-CM

## 2018-09-07 MED ORDER — INSULIN DEGLUDEC 200 UNIT/ML ~~LOC~~ SOPN: 120.0000 [IU] | PEN_INJECTOR | Freq: Every day | SUBCUTANEOUS | 2 refills | Status: DC

## 2018-09-07 MED ORDER — GLIPIZIDE ER 10 MG PO TB24: 10.0000 mg | ORAL_TABLET | Freq: Every day | ORAL | 3 refills | Status: DC

## 2018-09-07 NOTE — Progress Notes (Signed)
09/07/2018, 6:13 PM                                                    Endocrinology Telehealth Visit Follow up Note -During COVID -19 Pandemic  This visit type was conducted due to national recommendations for restrictions regarding the COVID-19 Pandemic  in an effort to limit this patient's exposure and mitigate transmission of the corona virus.  Due to her co-morbid illnesses, Tracey [name] is at  moderate to high risk for complications without adequate follow up.  This format is felt to be most appropriate for her at this time.  I connected with this patient on 09/07/2018   by telephone and verified that I am speaking with the correct person using two identifiers. Tracey [name], 03-Jul-1955. she has verbally consented to this visit. All issues noted in this document were discussed and addressed. The format was not optimal for physical exam.    Subjective:    Patient ID: Tracey [name], female    DOB: [birth year].  Tracey [name] is being engaged in telehealth in follow-up  for management of currently uncontrolled symptomatic type 2 diabetes, hyperlipidemia, hypertension. PMD:   Fayrene Helper, MD.  Past Medical History:  Diagnosis Date  . Arthritis   . Diabetes mellitus without complication ([place])   . Diastolic dysfunction 10/6732   Grade 1  . GERD (gastroesophageal reflux disease)   . Hyperlipidemia   . Hyperlipidemia   . Hypertension 04/15/2012  . Morbid obesity ([place])   . PE (pulmonary embolism)   . Pulmonary emboli ([place]) 1999   post TAH  . Sleep apnea    Past Surgical History:  Procedure Laterality Date  . ABDOMINAL HYSTERECTOMY  1999   , totalfor fibroids  . CESAREAN SECTION    . CHOLECYSTECTOMY  1996  . COLONOSCOPY N/A 10/31/2015   Procedure: COLONOSCOPY;  Surgeon: Rogene Houston, MD;  Location: [facility];  Service: Endoscopy;  Laterality:  N/A;  930   Social History   Socioeconomic History  . Marital status: Single    Spouse name: Not on file  . Number of children: Not on file  . Years of education: Not on file  . Highest education level: Not on file  Occupational History  . Not on file  Social Needs  . Financial resource strain: Somewhat hard  . Food insecurity:    Worry: Often true    Inability: Often true  . Transportation needs:    Medical: No    Non-medical: No  Tobacco Use  . Smoking status: Never Smoker  . Smokeless tobacco: Never Used  Substance and Sexual Activity  . Alcohol use: No    Alcohol/week: 0.0 standard drinks  . Drug use: No  . Sexual activity: Not Currently  Lifestyle  .  Physical activity:    Days per week: 0 days    Minutes per session: 0 min  . Stress: To some extent  Relationships  . Social connections:    Talks on phone: More than three times a week    Gets together: More than three times a week    Attends religious service: Never    Active member of club or organization: No    Attends meetings of clubs or organizations: Never    Relationship status: Divorced  Other Topics Concern  . Not on file  Social History Narrative  . Not on file   Outpatient Encounter Medications as of 09/07/2018  Medication Sig  . ACCU-CHEK SOFTCLIX LANCETS lancets Use as instructed 4x daily dx E11.65  . aspirin EC 81 MG tablet Take 1 tablet (81 mg total) by mouth daily.  . clotrimazole (GYNE-LOTRIMIN 3) 2 % vaginal cream Place 1 Applicatorful vaginally at bedtime.  . ergocalciferol (VITAMIN D2) 50000 units capsule Take 1 capsule (50,000 Units total) by mouth once a week. One capsule once weekly  . glipiZIDE (GLUCOTROL XL) 10 MG 24 hr tablet Take 1 tablet (10 mg total) by mouth daily with breakfast.  . glucose blood (ACCU-CHEK AVIVA) test strip Use as instructed 4 x daily dx E11.65  . ibuprofen (ADVIL,MOTRIN) 200 MG tablet Take 200 mg by mouth every 8 (eight) hours as needed for moderate pain.   .  Insulin Degludec (TRESIBA FLEXTOUCH) 200 UNIT/ML SOPN Inject 120 Units into the skin at bedtime.  . Insulin Pen Needle (B-D ULTRAFINE III SHORT PEN) 31G X 8 MM MISC 1 each by Does not apply route as directed.  . metFORMIN (GLUCOPHAGE) 1000 MG tablet TAKE ONE TABLET (1000 MG TOTAL) BY MOUTHTWO TIMES DAILY WITH A MEAL.  . metoprolol tartrate (LOPRESSOR) 50 MG tablet Take 1 tablet (50 mg total) by mouth 2 (two) times daily.  Marland Kitchen omeprazole (PRILOSEC) 20 MG capsule TAKE ONE CAPSULE BY MOUTH DAILY  . rosuvastatin (CRESTOR) 20 MG tablet TAKE ONE TABLET (20MG  TOTAL) BY MOUTH DAILY  . spironolactone (ALDACTONE) 25 MG tablet TAKE ONE (1) TABLET BY MOUTH EVERY DAY  . UNABLE TO FIND Bedside commode x 1  Dx m76.9, m54.4  . UNABLE TO FIND Shower bench x 1 Shower grab bar x 1  Dx: m76.9, m54.5  . [DISCONTINUED] glipiZIDE (GLUCOTROL XL) 5 MG 24 hr tablet Take 1 tablet (5 mg total) by mouth daily with breakfast.  . [DISCONTINUED] Insulin Degludec (TRESIBA FLEXTOUCH) 200 UNIT/ML SOPN Inject 100 Units into the skin at bedtime.   No facility-administered encounter medications on file as of 09/07/2018.     ALLERGIES: Allergies  Allergen Reactions  . Haloperidol Lactate Anaphylaxis  . Lipitor [Atorvastatin Calcium] Other (See Comments)    Markedly elevated liver enzymes    VACCINATION STATUS: Immunization History  Administered Date(s) Administered  . Influenza,inj,Quad PF,6+ Mos 04/01/2015, 03/16/2016, 01/20/2017, 04/11/2018  . Pneumococcal Polysaccharide-23 05/13/2015    Diabetes  She presents for her follow-up diabetic visit. She has type 2 diabetes mellitus. Onset time: He was diagnosed at approximate age of 65 years. Her disease course has been worsening. There are no hypoglycemic associated symptoms. Pertinent negatives for hypoglycemia include no confusion, headaches, pallor or seizures. Associated symptoms include blurred vision, fatigue, polydipsia and polyuria. Pertinent negatives for diabetes  include no chest pain and no polyphagia. There are no hypoglycemic complications. Symptoms are worsening. There are no diabetic complications. Risk factors for coronary artery disease include diabetes mellitus, dyslipidemia, family history,  hypertension, obesity, post-menopausal and sedentary lifestyle. She is following a generally unhealthy diet. When asked about meal planning, she reported none. She has had a previous visit with a dietitian. She never participates in exercise. Her home blood glucose trend is increasing steadily. Her breakfast blood glucose range is generally >200 mg/dl. Her lunch blood glucose range is generally >200 mg/dl. Her dinner blood glucose range is generally >200 mg/dl. Her bedtime blood glucose range is generally >200 mg/dl. Her overall blood glucose range is >200 mg/dl. (  Her most recent labs show A1c of greater than 14%.) An ACE inhibitor/angiotensin II receptor blocker is not being taken. She does not see a podiatrist.Eye exam is not current.  Hyperlipidemia  This is a chronic problem. The current episode started more than 1 year ago. The problem is uncontrolled. Exacerbating diseases include diabetes and obesity. Pertinent negatives include no chest pain, myalgias or shortness of breath. Current antihyperlipidemic treatment includes statins. Risk factors for coronary artery disease include diabetes mellitus, dyslipidemia, hypertension, obesity, a sedentary lifestyle and post-menopausal.  Hypertension  Associated symptoms include blurred vision. Pertinent negatives include no chest pain, headaches, palpitations or shortness of breath. Risk factors for coronary artery disease include dyslipidemia, diabetes mellitus, obesity, sedentary lifestyle and post-menopausal state. Past treatments include calcium channel blockers and beta blockers.     Objective:    There were no vitals taken for this visit.  Wt Readings from Last 3 Encounters:  08/09/18 (!) 380 lb (172.4 kg)   08/09/18 (!) 380 lb (172.4 kg)  05/06/18 (!) 379 lb (171.9 kg)       CMP     Component Value Date/Time   NA 133 (L) 08/04/2018 1502   K 4.5 08/04/2018 1502   CL 96 (L) 08/04/2018 1502   CO2 22 08/04/2018 1502   GLUCOSE 336 (H) 08/04/2018 1502   BUN 8 08/04/2018 1502   CREATININE 0.79 08/04/2018 1502   CALCIUM 9.4 08/04/2018 1502   PROT 7.5 08/04/2018 1502   ALBUMIN 3.9 07/13/2016 1430   AST 37 (H) 08/04/2018 1502   ALT 28 08/04/2018 1502   ALKPHOS 61 07/13/2016 1430   BILITOT 0.7 08/04/2018 1502   GFRNONAA 81 08/04/2018 1502   GFRAA 94 08/04/2018 1502     Diabetic Labs (most recent): Lab Results  Component Value Date   HGBA1C >14.0 (H) 08/04/2018   HGBA1C 10.5 (H) 03/23/2018   HGBA1C 9.3 (H) 11/01/2017     Lipid Panel ( most recent) Lipid Panel     Component Value Date/Time   CHOL 183 01/27/2018 1445   TRIG 108 01/27/2018 1445   HDL 40 (L) 01/27/2018 1445   CHOLHDL 4.6 01/27/2018 1445   VLDL 23 07/13/2016 1430   LDLCALC 121 (H) 01/27/2018 1445      Lab Results  Component Value Date   TSH 1.23 05/09/2018   TSH 0.95 07/13/2016   TSH 1.26 07/10/2015   TSH 1.382 11/17/2013   TSH 0.519 04/14/2012   TSH 1.525 11/07/2009      Lipid Panel     Component Value Date/Time   CHOL 183 01/27/2018 1445   TRIG 108 01/27/2018 1445   HDL 40 (L) 01/27/2018 1445   CHOLHDL 4.6 01/27/2018 1445   VLDL 23 07/13/2016 1430   LDLCALC 121 (H) 01/27/2018 1445     Assessment & Plan:   1. Uncontrolled type 2 diabetes mellitus with hyperglycemia ([place])  - Tracey [name] has currently uncontrolled symptomatic type 2 DM since 63 years of age. -She  reports slightly improving, however still persistently above target glycemic profile averaging greater than 250 mg per DL.    Her recent labs show a1c of >14%.  -her diabetes is complicated by obesity/sedentary life and Tracey [name] remains at a high risk for more acute and chronic complications which include CAD, CVA,  CKD, retinopathy, and neuropathy. These are all discussed in detail with the patient.  - I have counseled her on diet management and weight loss, by adopting a carbohydrate restricted/protein rich diet.  - Patient admits there is a room for improvement in her diet and drink choices. -  Suggestion is made for her to avoid simple carbohydrates  from her diet including Cakes, Sweet Desserts / Pastries, Ice Cream, Soda (diet and regular), Sweet Tea, Candies, Chips, Cookies, Store Bought Juices, Alcohol in Excess of  1-2 drinks a day, Artificial Sweeteners, and "Sugar-free" Products. This will help patient to have stable blood glucose profile and potentially avoid unintended weight gain.  - I encouraged her to switch to  unprocessed or minimally processed complex starch and increased protein intake (animal or plant source), fruits, and vegetables.  - she is advised to stick to a routine mealtimes to eat 3 meals  a day and avoid unnecessary snacks ( to snack only to correct hypoglycemia).   - she is following with  Jearld Fenton, RDN, CDE for individualized diabetes education.  - I have approached her with the following individualized plan to manage diabetes and patient agrees:   -She may need intensive therapy with basal/bolus insulin, but she has room on her basal insulin before considering bolus insulin.  -She is advised to increase her Tresiba to 120 units nightly, associated with strict monitoring of blood glucose 4 times a day-daily before meals and at bedtime.   -Patient is encouraged to call clinic for blood glucose levels less than 70 or above 200 mg /dl. -She is advised to continue metformin 1000 mg p.o. twice daily. -She will likely require prandial insulin, advised to return in 2 weeks with her meter and logs. -I discussed and increased her glipizide to 10 mg XL p.o. daily with breakfast.     2) BP/HTN: she is advised to home monitor blood pressure and report if > 140/90 on 2 separate  readings.   She is advised to continue her current medications including spironolactone, metoprolol, amlodipine.   3) Lipids/HPL: Her recent lipid panel showed uncontrolled LDL of 166.   She is advised to continue Crestor 10 mg p.o. nightly.    She will need fasting lipid panel on subsequent visits.   4) Chronic Care/Health Maintenance:  -she  is on Statin medications and  is encouraged to continue to follow up with Ophthalmology, Dentist,  Podiatrist at least yearly or according to recommendations, and advised to   stay away from smoking. I have recommended yearly flu vaccine and pneumonia vaccination at least every 5 years; moderate intensity exercise for up to 150 minutes weekly; and  sleep for at least 7 hours a day.  - I advised patient to maintain close follow up with Fayrene Helper, MD for primary care needs.  - Patient Care Time Today:  25 min, of which >50% was spent in reviewing her  current and  previous labs/studies, her blood glucose profile, previous treatments, and medications doses and developing a plan for long-term care based on the latest recommendations for standards of care.  Tracey [name] participated in the discussions, expressed understanding, and voiced agreement with  the above plans.  All questions were answered to her satisfaction. she is encouraged to contact clinic should she have any questions or concerns prior to her return visit.  Follow up plan: - Return in about 2 weeks (around 09/21/2018) for Follow up with Meter and Logs Only - no Labs.  Glade Lloyd, MD [facility] [address] [place], [place] [address] Phone: [phone]  Fax: [phone]    09/07/2018, 6:13 PM  This note was partially dictated with voice recognition software. Similar sounding words can be transcribed inadequately or may not  be corrected upon review.

## 2018-09-08 ENCOUNTER — Encounter: Payer: Medicare Other | Attending: Family Medicine | Admitting: Nutrition

## 2018-09-08 ENCOUNTER — Other Ambulatory Visit: Payer: Self-pay

## 2018-09-08 ENCOUNTER — Encounter: Payer: Self-pay | Admitting: Nutrition

## 2018-09-08 DIAGNOSIS — IMO0002 Reserved for concepts with insufficient information to code with codable children: Secondary | ICD-10-CM

## 2018-09-08 DIAGNOSIS — E118 Type 2 diabetes mellitus with unspecified complications: Secondary | ICD-10-CM | POA: Insufficient documentation

## 2018-09-08 DIAGNOSIS — E1165 Type 2 diabetes mellitus with hyperglycemia: Secondary | ICD-10-CM

## 2018-09-08 NOTE — Patient Instructions (Addendum)
.  Goal Drink 2 16 oz bottles of water with each meal.  Eat 2 vegetables with lunch and dinner Don't skip meals No sweets and junk food. Eat a  piece of fruit with each meal Test blood sugar 4 times per day.  Walk 15 minutes a day Take 12o units of Tresiba daily and 10 mg of Glipizide daily.

## 2018-09-08 NOTE — Progress Notes (Signed)
Follow up phone visit  Nutrition Therapy  Visit Type:    f/u  Appt. Start Time: 1100 Appt. End Time: 1115 09/08/2018  Tracey [name], identified by name and date of birth, is a 63 y.o. female with a diagnosis of Diabetes Type 2:   talked to Dr. Dorris Fetch yesterday. Increased insulin of 120  units Tresiba..Metformin 1000 mg BID and Glipizide 10 mg once a day.. FBS 235, 328, 261, 292, 258 BL) 258, 233, 212, 306 BD) 275, 327, 212, 258 Bedtime:  272,272, 233, 362    B) Cherrios 1-1/2 c with 1% milk, applesauce, L) Hamburger with bun, lettuce/.tomatoes and mustard, unsweet tea D)hamburger with bun, lettuce, tomatoes, mustard,  water  Making little progress with better food choices. BS are still elevated but hopefully will come down with increased insulin.   Drinking more water. Still struggles with eating sweets. Doesn't walk much due to her legs giving out. Usually Rides buggy carts in stores.  Lacks commitment to making consistent changes with diet. Tends to skip meals often and then overeats and snacks. Lacks adequate access to healthy foods due to limited income.  Lacks adequate access to fresh fruits and vegetables.    ASSESSMENT  Needs on going support to make changes with diet and exercise.   Lab Results  Component Value Date   HGBA1C >14.0 (H) 08/04/2018      CMP Latest Ref Rng & Units 08/04/2018 01/27/2018 11/01/2017  Glucose 65 - 139 mg/dL 336(H) 222(H) 254(H)  BUN 7 - 25 mg/dL 8 8 12   Creatinine 0.50 - 0.99 mg/dL 0.79 0.80 0.75  Sodium 135 - 146 mmol/L 133(L) 136 138  Potassium 3.5 - 5.3 mmol/L 4.5 4.4 4.2  Chloride 98 - 110 mmol/L 96(L) 100 102  CO2 20 - 32 mmol/L 22 24 23   Calcium 8.6 - 10.4 mg/dL 9.4 9.0 9.2  Total Protein 6.1 - 8.1 g/dL 7.5 7.6 7.7  Total Bilirubin 0.2 - 1.2 mg/dL 0.7 0.6 0.5  Alkaline Phos 33 - 130 U/L - - -  AST 10 - 35 U/L 37(H) 27 31  ALT 6 - 29 U/L 28 25 32(H)    Learning Objective:  Patient will have a greater understanding of  diabetes self-management. Patient education plan is to attend individual and/or group sessions per assessed needs and concerns.  .Goal Drink 2 16 oz bottles of water with each meal.  Eat 2 vegetables with lunch and dinner Don't skip meals No sweets and junk food. Eat a  piece of fruit with each meal Test blood sugar 4 times per day.  Walk 15 minutes a day Take 12o units of Tresiba daily and 10 mg of Glipizide daily.    Expected Outcomes:    Improved self management of her DM.  Education material provided: My Plate and Carbohydrate counting sheet  If problems or questions, patient to contact team via:  Phone and Email  Future DSME appointment: -   f/u 1 month. She would benefit from MDI's to help control blood sugars better. Needs a lot of ongoing support to comply with diet restrictions.

## 2018-09-21 ENCOUNTER — Ambulatory Visit (INDEPENDENT_AMBULATORY_CARE_PROVIDER_SITE_OTHER): Payer: Medicare Other | Admitting: "Endocrinology

## 2018-09-21 ENCOUNTER — Other Ambulatory Visit: Payer: Self-pay

## 2018-09-21 ENCOUNTER — Encounter: Payer: Self-pay | Admitting: "Endocrinology

## 2018-09-21 DIAGNOSIS — E782 Mixed hyperlipidemia: Secondary | ICD-10-CM | POA: Diagnosis not present

## 2018-09-21 DIAGNOSIS — I10 Essential (primary) hypertension: Secondary | ICD-10-CM | POA: Diagnosis not present

## 2018-09-21 DIAGNOSIS — E1165 Type 2 diabetes mellitus with hyperglycemia: Secondary | ICD-10-CM | POA: Diagnosis not present

## 2018-09-21 MED ORDER — INSULIN DEGLUDEC 200 UNIT/ML ~~LOC~~ SOPN: 130.0000 [IU] | PEN_INJECTOR | Freq: Every day | SUBCUTANEOUS | 2 refills | Status: DC

## 2018-09-21 NOTE — Progress Notes (Signed)
09/21/2018, 4:52 PM                                                    Endocrinology Telehealth Visit Follow up Note -During COVID -19 Pandemic  This visit type was conducted due to national recommendations for restrictions regarding the COVID-19 Pandemic  in an effort to limit this patient's exposure and mitigate transmission of the corona virus.  Due to her co-morbid illnesses, Tracey [name] is at  moderate to high risk for complications without adequate follow up.  This format is felt to be most appropriate for her at this time.  I connected with this patient on 09/21/2018   by telephone and verified that I am speaking with the correct person using two identifiers. Tracey [name], 1955/11/26. she has verbally consented to this visit. All issues noted in this document were discussed and addressed. The format was not optimal for physical exam.    Subjective:    Patient ID: Tracey [name], female    DOB: 1955-06-05.  Tracey [name] is being engaged in telehealth in follow-up  for management of currently uncontrolled symptomatic type 2 diabetes, hyperlipidemia, hypertension. PMD:   Fayrene Helper, MD.  Past Medical History:  Diagnosis Date  . Arthritis   . Diabetes mellitus without complication ([place])   . Diastolic dysfunction 05/6832   Grade 1  . GERD (gastroesophageal reflux disease)   . Hyperlipidemia   . Hyperlipidemia   . Hypertension 04/15/2012  . Morbid obesity ([place])   . PE (pulmonary embolism)   . Pulmonary emboli ([place]) 1999   post TAH  . Sleep apnea    Past Surgical History:  Procedure Laterality Date  . ABDOMINAL HYSTERECTOMY  1999   , totalfor fibroids  . CESAREAN SECTION    . CHOLECYSTECTOMY  1996  . COLONOSCOPY N/A 10/31/2015   Procedure: COLONOSCOPY;  Surgeon: Rogene Houston, MD;  Location: [facility];  Service: Endoscopy;  Laterality:  N/A;  930   Social History   Socioeconomic History  . Marital status: Single    Spouse name: Not on file  . Number of children: Not on file  . Years of education: Not on file  . Highest education level: Not on file  Occupational History  . Not on file  Social Needs  . Financial resource strain: Somewhat hard  . Food insecurity:    Worry: Often true    Inability: Often true  . Transportation needs:    Medical: No    Non-medical: No  Tobacco Use  . Smoking status: Never Smoker  . Smokeless tobacco: Never Used  Substance and Sexual Activity  . Alcohol use: No    Alcohol/week: 0.0 standard drinks  . Drug use: No  . Sexual activity: Not Currently  Lifestyle  .  Physical activity:    Days per week: 0 days    Minutes per session: 0 min  . Stress: To some extent  Relationships  . Social connections:    Talks on phone: More than three times a week    Gets together: More than three times a week    Attends religious service: Never    Active member of club or organization: No    Attends meetings of clubs or organizations: Never    Relationship status: Divorced  Other Topics Concern  . Not on file  Social History Narrative  . Not on file   Outpatient Encounter Medications as of 09/21/2018  Medication Sig  . ACCU-CHEK SOFTCLIX LANCETS lancets Use as instructed 4x daily dx E11.65  . aspirin EC 81 MG tablet Take 1 tablet (81 mg total) by mouth daily.  . clotrimazole (GYNE-LOTRIMIN 3) 2 % vaginal cream Place 1 Applicatorful vaginally at bedtime.  . ergocalciferol (VITAMIN D2) 50000 units capsule Take 1 capsule (50,000 Units total) by mouth once a week. One capsule once weekly  . glipiZIDE (GLUCOTROL XL) 10 MG 24 hr tablet Take 1 tablet (10 mg total) by mouth daily with breakfast.  . glucose blood (ACCU-CHEK AVIVA) test strip Use as instructed 4 x daily dx E11.65  . ibuprofen (ADVIL,MOTRIN) 200 MG tablet Take 200 mg by mouth every 8 (eight) hours as needed for moderate pain.   .  Insulin Degludec (TRESIBA FLEXTOUCH) 200 UNIT/ML SOPN Inject 130 Units into the skin at bedtime.  . Insulin Pen Needle (B-D ULTRAFINE III SHORT PEN) 31G X 8 MM MISC 1 each by Does not apply route as directed.  . metFORMIN (GLUCOPHAGE) 1000 MG tablet TAKE ONE TABLET (1000 MG TOTAL) BY MOUTHTWO TIMES DAILY WITH A MEAL.  . metoprolol tartrate (LOPRESSOR) 50 MG tablet Take 1 tablet (50 mg total) by mouth 2 (two) times daily.  Marland Kitchen omeprazole (PRILOSEC) 20 MG capsule TAKE ONE CAPSULE BY MOUTH DAILY  . rosuvastatin (CRESTOR) 20 MG tablet TAKE ONE TABLET (20MG  TOTAL) BY MOUTH DAILY  . spironolactone (ALDACTONE) 25 MG tablet TAKE ONE (1) TABLET BY MOUTH EVERY DAY  . UNABLE TO FIND Bedside commode x 1  Dx m76.9, m54.4  . UNABLE TO FIND Shower bench x 1 Shower grab bar x 1  Dx: m76.9, m54.5  . [DISCONTINUED] Insulin Degludec (TRESIBA FLEXTOUCH) 200 UNIT/ML SOPN Inject 120 Units into the skin at bedtime.   No facility-administered encounter medications on file as of 09/21/2018.     ALLERGIES: Allergies  Allergen Reactions  . Haloperidol Lactate Anaphylaxis  . Lipitor [Atorvastatin Calcium] Other (See Comments)    Markedly elevated liver enzymes    VACCINATION STATUS: Immunization History  Administered Date(s) Administered  . Influenza,inj,Quad PF,6+ Mos 04/01/2015, 03/16/2016, 01/20/2017, 04/11/2018  . Pneumococcal Polysaccharide-23 05/13/2015    Diabetes  She presents for her follow-up diabetic visit. She has type 2 diabetes mellitus. Onset time: He was diagnosed at approximate age of 70 years. Her disease course has been improving. There are no hypoglycemic associated symptoms. Pertinent negatives for hypoglycemia include no confusion, headaches, pallor or seizures. Associated symptoms include polydipsia and polyuria. Pertinent negatives for diabetes include no blurred vision, no chest pain, no fatigue and no polyphagia. There are no hypoglycemic complications. Symptoms are improving. There are  no diabetic complications. Risk factors for coronary artery disease include diabetes mellitus, dyslipidemia, family history, hypertension, obesity, post-menopausal and sedentary lifestyle. She is following a generally unhealthy diet. When asked about meal planning, she reported  none. She has had a previous visit with a dietitian. She never participates in exercise. Her home blood glucose trend is increasing steadily. Her breakfast blood glucose range is generally 180-200 mg/dl. Her dinner blood glucose range is generally 180-200 mg/dl. Her bedtime blood glucose range is generally 180-200 mg/dl. Her overall blood glucose range is 180-200 mg/dl. (  Her most recent labs show A1c of greater than 14%.) An ACE inhibitor/angiotensin II receptor blocker is not being taken. She does not see a podiatrist.Eye exam is not current.  Hyperlipidemia  This is a chronic problem. The current episode started more than 1 year ago. The problem is uncontrolled. Exacerbating diseases include diabetes and obesity. Pertinent negatives include no chest pain, myalgias or shortness of breath. Current antihyperlipidemic treatment includes statins. Risk factors for coronary artery disease include diabetes mellitus, dyslipidemia, hypertension, obesity, a sedentary lifestyle and post-menopausal.  Hypertension  Pertinent negatives include no blurred vision, chest pain, headaches, palpitations or shortness of breath. Risk factors for coronary artery disease include dyslipidemia, diabetes mellitus, obesity, sedentary lifestyle and post-menopausal state. Past treatments include calcium channel blockers and beta blockers.     Objective:    There were no vitals taken for this visit.  Wt Readings from Last 3 Encounters:  08/09/18 (!) 380 lb (172.4 kg)  08/09/18 (!) 380 lb (172.4 kg)  05/06/18 (!) 379 lb (171.9 kg)       CMP     Component Value Date/Time   NA 133 (L) 08/04/2018 1502   K 4.5 08/04/2018 1502   CL 96 (L) 08/04/2018  1502   CO2 22 08/04/2018 1502   GLUCOSE 336 (H) 08/04/2018 1502   BUN 8 08/04/2018 1502   CREATININE 0.79 08/04/2018 1502   CALCIUM 9.4 08/04/2018 1502   PROT 7.5 08/04/2018 1502   ALBUMIN 3.9 07/13/2016 1430   AST 37 (H) 08/04/2018 1502   ALT 28 08/04/2018 1502   ALKPHOS 61 07/13/2016 1430   BILITOT 0.7 08/04/2018 1502   GFRNONAA 81 08/04/2018 1502   GFRAA 94 08/04/2018 1502     Diabetic Labs (most recent): Lab Results  Component Value Date   HGBA1C >14.0 (H) 08/04/2018   HGBA1C 10.5 (H) 03/23/2018   HGBA1C 9.3 (H) 11/01/2017     Lipid Panel ( most recent) Lipid Panel     Component Value Date/Time   CHOL 183 01/27/2018 1445   TRIG 108 01/27/2018 1445   HDL 40 (L) 01/27/2018 1445   CHOLHDL 4.6 01/27/2018 1445   VLDL 23 07/13/2016 1430   LDLCALC 121 (H) 01/27/2018 1445      Lab Results  Component Value Date   TSH 1.23 05/09/2018   TSH 0.95 07/13/2016   TSH 1.26 07/10/2015   TSH 1.382 11/17/2013   TSH 0.519 04/14/2012   TSH 1.525 11/07/2009      Lipid Panel     Component Value Date/Time   CHOL 183 01/27/2018 1445   TRIG 108 01/27/2018 1445   HDL 40 (L) 01/27/2018 1445   CHOLHDL 4.6 01/27/2018 1445   VLDL 23 07/13/2016 1430   LDLCALC 121 (H) 01/27/2018 1445     Assessment & Plan:   1. Uncontrolled type 2 diabetes mellitus with hyperglycemia ([place])  - Tracey [name] has currently uncontrolled symptomatic type 2 DM since 63 years of age. -She reports slightly improving, however still persistently above target glycemic profile averaging greater than 200 mg per DL.    Her recent labs show a1c of >14%.  -her diabetes is complicated  by obesity/sedentary life and Tracey [name] remains at a high risk for more acute and chronic complications which include CAD, CVA, CKD, retinopathy, and neuropathy. These are all discussed in detail with the patient.  - I have counseled her on diet management and weight loss, by adopting a carbohydrate  restricted/protein rich diet.  - she  admits there is a room for improvement in her diet and drink choices. -  Suggestion is made for her to avoid simple carbohydrates  from her diet including Cakes, Sweet Desserts / Pastries, Ice Cream, Soda (diet and regular), Sweet Tea, Candies, Chips, Cookies, Sweet Pastries,  Store Bought Juices, Alcohol in Excess of  1-2 drinks a day, Artificial Sweeteners, Coffee Creamer, and "Sugar-free" Products. This will help patient to have stable blood glucose profile and potentially avoid unintended weight gain.  - I encouraged her to switch to  unprocessed or minimally processed complex starch and increased protein intake (animal or plant source), fruits, and vegetables.  - she is advised to stick to a routine mealtimes to eat 3 meals  a day and avoid unnecessary snacks ( to snack only to correct hypoglycemia).   - she is following with  Jearld Fenton, RDN, CDE for individualized diabetes education.  - I have approached her with the following individualized plan to manage diabetes and patient agrees:   -She may need intensive therapy with basal/bolus insulin, but she has room on her basal insulin before considering bolus insulin.  -She is advised to increase her Tresiba to 130 units nightly, associated with strict monitoring of blood glucose 4 times a day-daily before meals and at bedtime.   -Patient is encouraged to call clinic for blood glucose levels less than 70 or above 200 mg /dl. -She is advised to continue metformin 1000 mg p.o. twice daily. -She will likely require prandial insulin, advised to return in 2 weeks with her meter and logs. -I discussed and increased her glipizide to 10 mg XL p.o. daily with breakfast.     2) BP/HTN: she is advised to home monitor blood pressure and report if > 140/90 on 2 separate readings.   She is advised to continue her current medications including spironolactone, metoprolol, amlodipine.   3) Lipids/HPL: Her recent  lipid panel showed uncontrolled LDL of 166.   She is advised to continue Crestor 10 mg p.o. nightly.    She will need fasting lipid panel on subsequent visits.   4) Chronic Care/Health Maintenance:  -she  is on Statin medications and  is encouraged to continue to follow up with Ophthalmology, Dentist,  Podiatrist at least yearly or according to recommendations, and advised to   stay away from smoking. I have recommended yearly flu vaccine and pneumonia vaccination at least every 5 years; moderate intensity exercise for up to 150 minutes weekly; and  sleep for at least 7 hours a day.  - I advised patient to maintain close follow up with Fayrene Helper, MD for primary care needs.  - Patient Care Time Today:  25 min, of which >50% was spent in reviewing her  current and  previous labs/studies, previous treatments, and medications doses and developing a plan for long-term care based on the latest recommendations for standards of care.  Tracey [name] participated in the discussions, expressed understanding, and voiced agreement with the above plans.  All questions were answered to her satisfaction. she is encouraged to contact clinic should she have any questions or concerns prior to her return visit.  Follow up plan: - Return in about 4 weeks (around 10/19/2018) for Follow up with Meter and Logs Only - no Labs.  Glade Lloyd, MD [facility] [address] [place], [place] [address] Phone: [phone]  Fax: [phone]    09/21/2018, 4:52 PM  This note was partially dictated with voice recognition software. Similar sounding words can be transcribed inadequately or may not  be corrected upon review.

## 2018-10-06 IMAGING — MG DIGITAL SCREENING BILATERAL MAMMOGRAM WITH TOMO AND CAD
8 of 12 series · 8 of 28 positions shown · non-contrast
Comparison: Previous exam(s).

CLINICAL DATA: Screening.

EXAM:
DIGITAL SCREENING BILATERAL MAMMOGRAM WITH TOMO AND CAD

[L CC (1 of 2)]
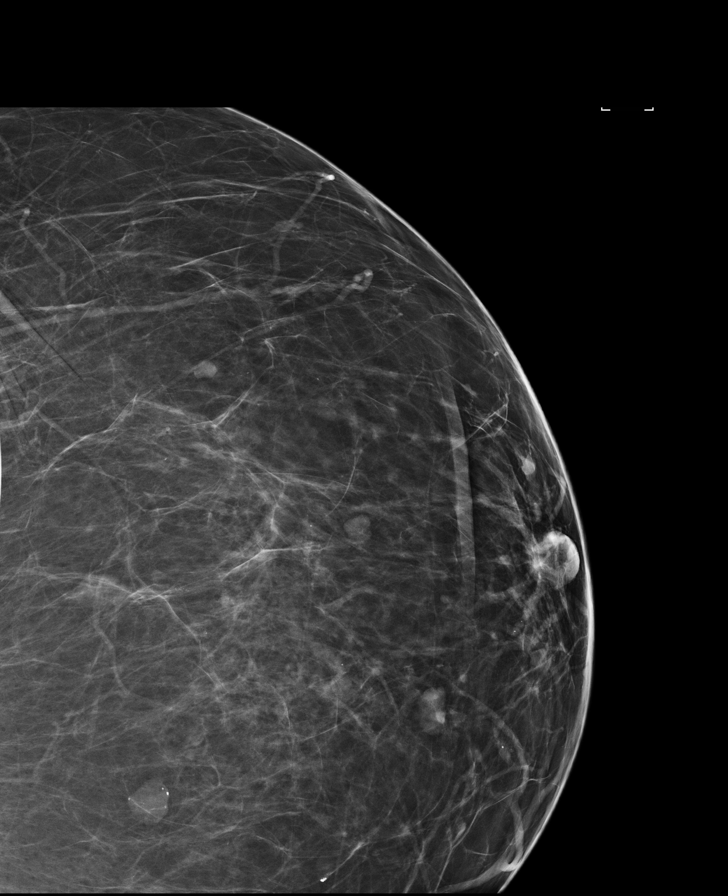

[L MLO (1 of 2)]
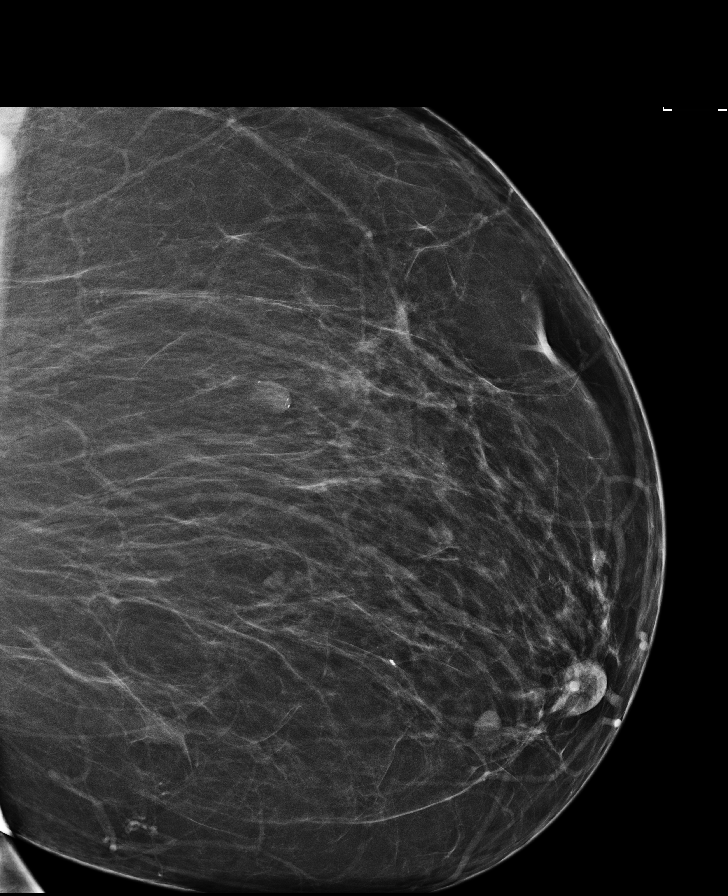

[R CC (1 of 2)]
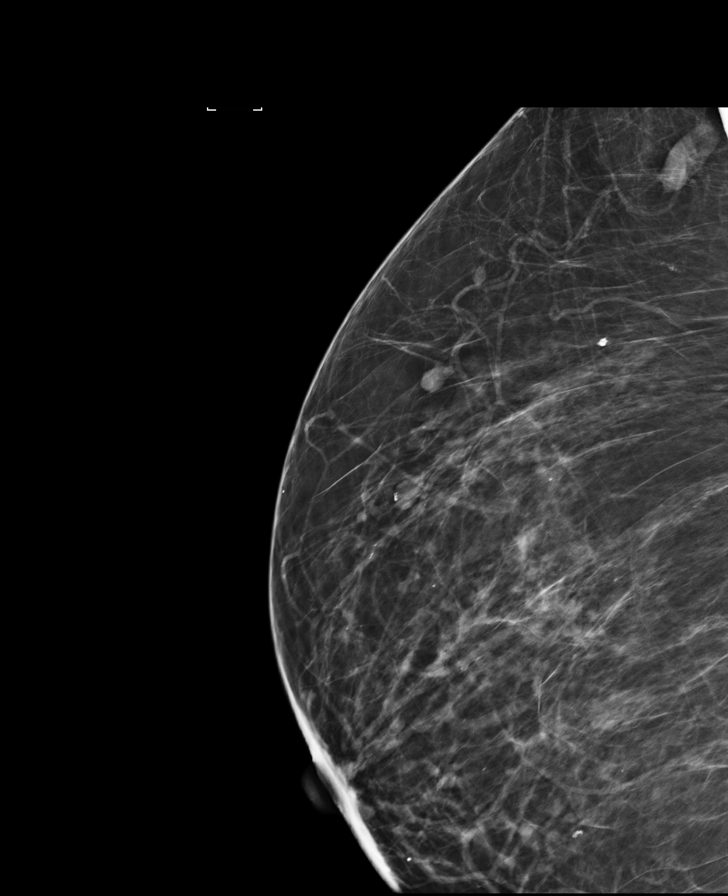

[R MLO (1 of 2)]
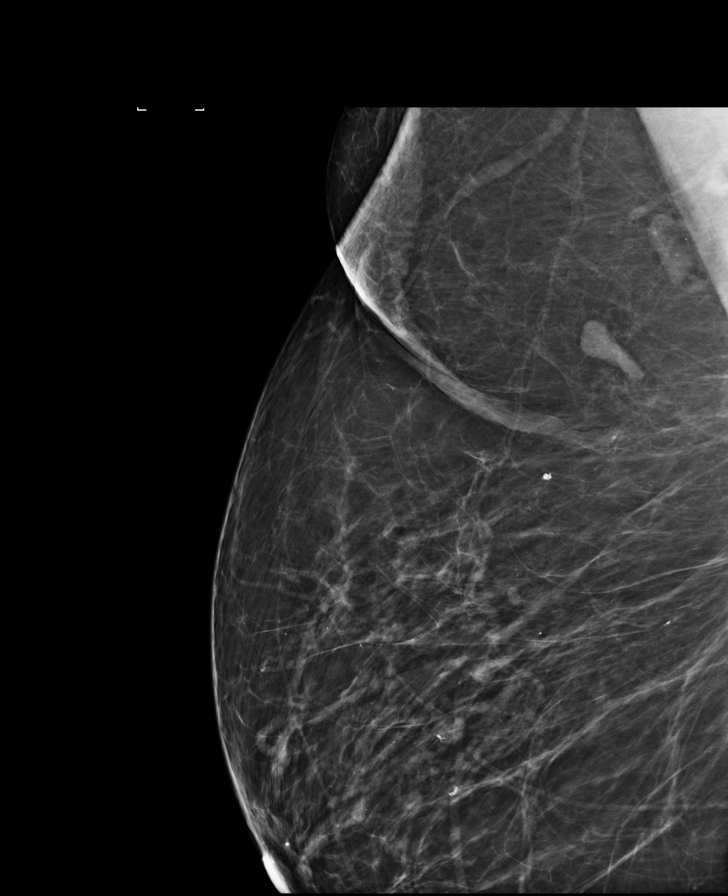

[L CC (2 of 2)]
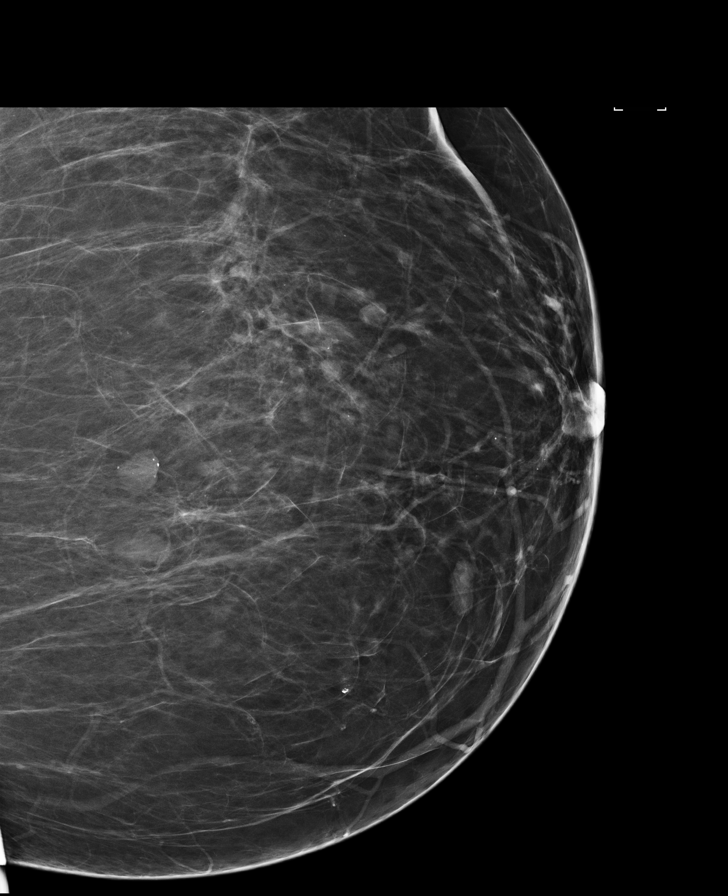

[R MLO (2 of 2)]
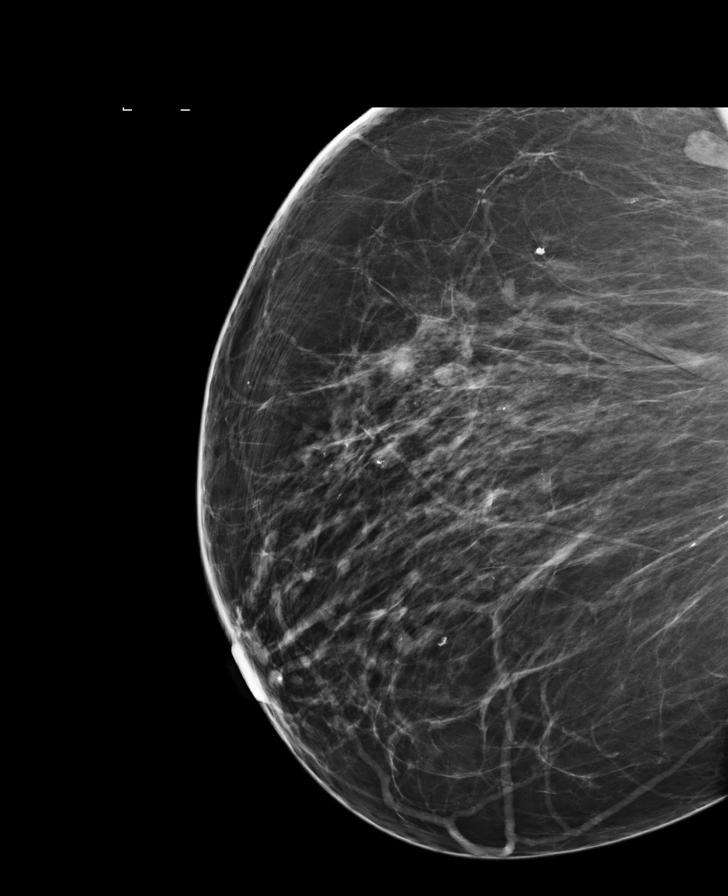

[L MLO (2 of 2)]
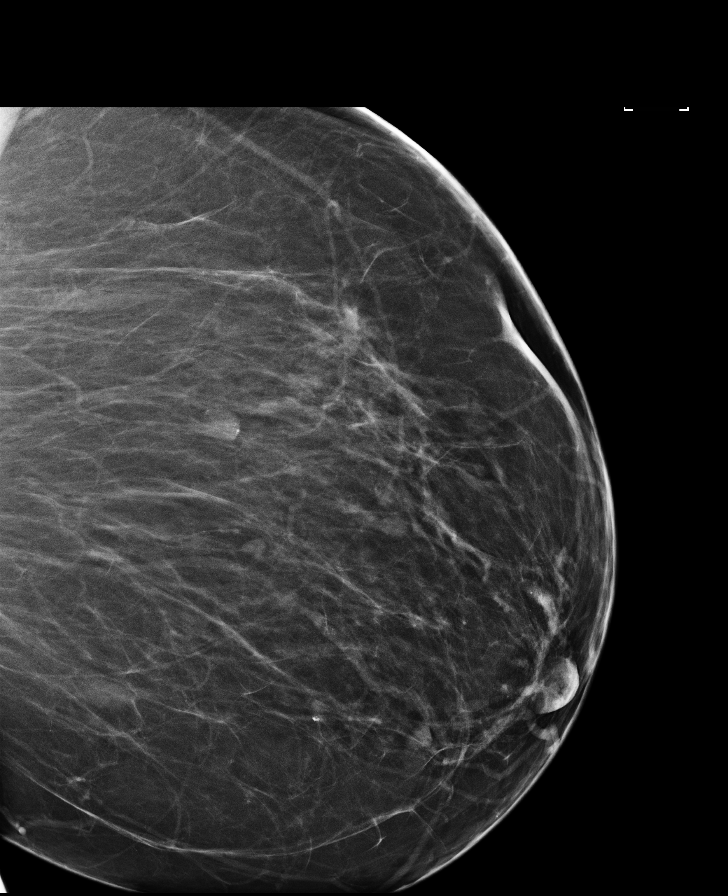

[R CC (2 of 2)]
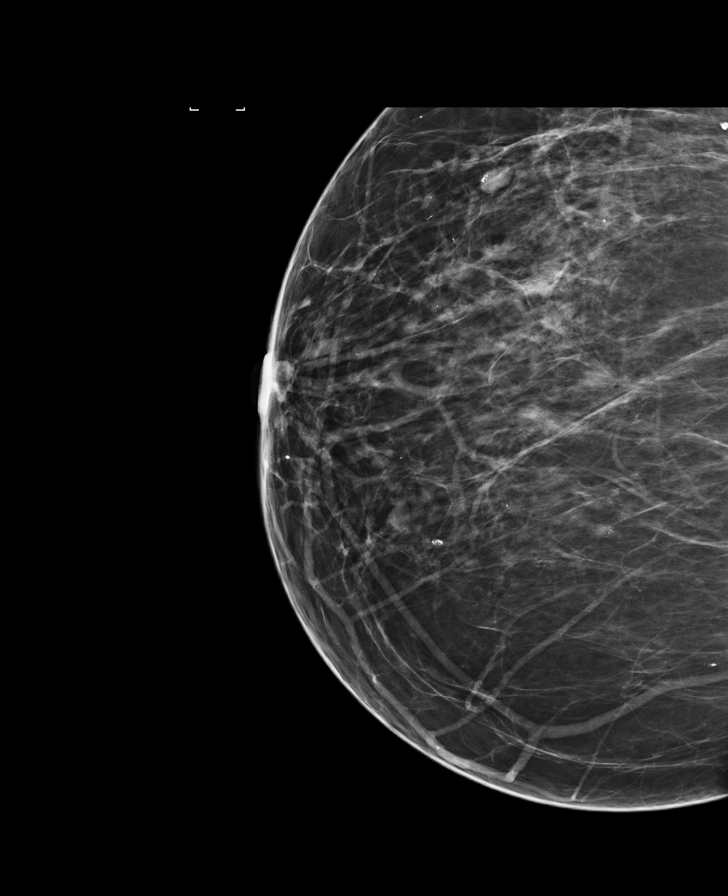

[8 of 28 positions shown; findings below may reference images not displayed]

ACR Breast Density Category b: There are scattered areas of
fibroglandular density.
FINDINGS: There are no findings suspicious for malignancy. Images were
processed with CAD.
IMPRESSION: No mammographic evidence of malignancy. A result letter of this
screening mammogram will be mailed directly to the patient.

RECOMMENDATION:
Screening mammogram in one year. (Code:[id number])

BI-RADS CATEGORY  1: Negative.

## 2018-10-11 ENCOUNTER — Other Ambulatory Visit (HOSPITAL_COMMUNITY): Payer: Self-pay | Admitting: Family Medicine

## 2018-10-11 DIAGNOSIS — Z1231 Encounter for screening mammogram for malignant neoplasm of breast: Secondary | ICD-10-CM

## 2018-10-17 ENCOUNTER — Ambulatory Visit: Payer: Medicare Other | Admitting: "Endocrinology

## 2018-10-18 ENCOUNTER — Other Ambulatory Visit: Payer: Self-pay | Admitting: Family Medicine

## 2018-10-24 ENCOUNTER — Ambulatory Visit: Payer: Medicare Other | Admitting: Nutrition

## 2018-11-10 ENCOUNTER — Ambulatory Visit (INDEPENDENT_AMBULATORY_CARE_PROVIDER_SITE_OTHER): Payer: Medicare Other | Admitting: "Endocrinology

## 2018-11-10 ENCOUNTER — Encounter: Payer: Self-pay | Admitting: "Endocrinology

## 2018-11-10 DIAGNOSIS — E782 Mixed hyperlipidemia: Secondary | ICD-10-CM

## 2018-11-10 DIAGNOSIS — I10 Essential (primary) hypertension: Secondary | ICD-10-CM | POA: Diagnosis not present

## 2018-11-10 DIAGNOSIS — E1165 Type 2 diabetes mellitus with hyperglycemia: Secondary | ICD-10-CM

## 2018-11-10 MED ORDER — TRESIBA FLEXTOUCH 200 UNIT/ML ~~LOC~~ SOPN: 80.0000 [IU] | PEN_INJECTOR | Freq: Two times a day (BID) | SUBCUTANEOUS | 2 refills | Status: DC

## 2018-11-10 NOTE — Progress Notes (Signed)
11/10/2018, 5:07 PM                                                    Endocrinology Telehealth Visit Follow up Note -During COVID -19 Pandemic  This visit type was conducted due to national recommendations for restrictions regarding the COVID-19 Pandemic  in an effort to limit this patient's exposure and mitigate transmission of the corona virus.  Due to her co-morbid illnesses, Tracey [name] is at  moderate to high risk for complications without adequate follow up.  This format is felt to be most appropriate for her at this time.  I connected with this patient on 11/10/2018   by telephone and verified that I am speaking with the correct person using two identifiers. Duane Boston, 1955-06-22. she has verbally consented to this visit. All issues noted in this document were discussed and addressed. The format was not optimal for physical exam.    Subjective:    Patient ID: Tracey [name], female    DOB: 03-03-1956.  Tracey [name] is being engaged in telehealth in follow-up  for management of currently uncontrolled symptomatic type 2 diabetes, hyperlipidemia, hypertension. PMD:   Tracey Helper, MD.  Past Medical History:  Diagnosis Date  . Arthritis   . Diabetes mellitus without complication ([place])   . Diastolic dysfunction 06/7046   Grade 1  . GERD (gastroesophageal reflux disease)   . Hyperlipidemia   . Hyperlipidemia   . Hypertension 04/15/2012  . Morbid obesity ([place])   . PE (pulmonary embolism)   . Pulmonary emboli ([place]) 1999   post TAH  . Sleep apnea    Past Surgical History:  Procedure Laterality Date  . ABDOMINAL HYSTERECTOMY  1999   , totalfor fibroids  . CESAREAN SECTION    . CHOLECYSTECTOMY  1996  . COLONOSCOPY N/A 10/31/2015   Procedure: COLONOSCOPY;  Surgeon: Tracey Houston, MD;  Location: [facility];  Service: Endoscopy;  Laterality:  N/A;  930   Social History   Socioeconomic History  . Marital status: Single    Spouse name: Not on file  . Number of children: Not on file  . Years of education: Not on file  . Highest education level: Not on file  Occupational History  . Not on file  Social Needs  . Financial resource strain: Somewhat hard  . Food insecurity    Worry: Often true    Inability: Often true  . Transportation needs    Medical: No    Non-medical: No  Tobacco Use  . Smoking status: Never Smoker  . Smokeless tobacco: Never Used  Substance and Sexual Activity  . Alcohol use: No    Alcohol/week: 0.0 standard drinks  . Drug use: No  . Sexual activity: Not Currently  Lifestyle  .  Physical activity    Days per week: 0 days    Minutes per session: 0 min  . Stress: To some extent  Relationships  . Social connections    Talks on phone: More than three times a week    Gets together: More than three times a week    Attends religious service: Never    Active member of club or organization: No    Attends meetings of clubs or organizations: Never    Relationship status: Divorced  Other Topics Concern  . Not on file  Social History Narrative  . Not on file   Outpatient Encounter Medications as of 11/10/2018  Medication Sig  . ACCU-CHEK SOFTCLIX LANCETS lancets Use as instructed 4x daily dx E11.65  . aspirin EC 81 MG tablet Take 1 tablet (81 mg total) by mouth daily.  . clotrimazole (GYNE-LOTRIMIN 3) 2 % vaginal cream Place 1 Applicatorful vaginally at bedtime.  . ergocalciferol (VITAMIN D2) 50000 units capsule Take 1 capsule (50,000 Units total) by mouth once a week. One capsule once weekly  . glipiZIDE (GLUCOTROL XL) 10 MG 24 hr tablet Take 1 tablet (10 mg total) by mouth daily with breakfast.  . glucose blood (ACCU-CHEK AVIVA) test strip Use as instructed 4 x daily dx E11.65  . ibuprofen (ADVIL,MOTRIN) 200 MG tablet Take 200 mg by mouth every 8 (eight) hours as needed for moderate pain.   .  Insulin Degludec (TRESIBA FLEXTOUCH) 200 UNIT/ML SOPN Inject 80 Units into the skin 2 (two) times daily at 8 am and 10 pm.  . Insulin Pen Needle (B-D ULTRAFINE III SHORT PEN) 31G X 8 MM MISC 1 each by Does not apply route as directed.  . metFORMIN (GLUCOPHAGE) 1000 MG tablet TAKE ONE TABLET (1000MG  TOTAL) BY MOUTH TWO TIMES DAILY WITH A MEAL  . metoprolol tartrate (LOPRESSOR) 50 MG tablet Take 1 tablet (50 mg total) by mouth 2 (two) times daily.  Marland Kitchen omeprazole (PRILOSEC) 20 MG capsule TAKE ONE CAPSULE BY MOUTH DAILY  . rosuvastatin (CRESTOR) 20 MG tablet TAKE ONE TABLET (20MG  TOTAL) BY MOUTH DAILY  . spironolactone (ALDACTONE) 25 MG tablet TAKE ONE (1) TABLET BY MOUTH EVERY DAY  . UNABLE TO FIND Bedside commode x 1  Dx m76.9, m54.4  . UNABLE TO FIND Shower bench x 1 Shower grab bar x 1  Dx: m76.9, m54.5  . [DISCONTINUED] Insulin Degludec (TRESIBA FLEXTOUCH) 200 UNIT/ML SOPN Inject 130 Units into the skin at bedtime.   No facility-administered encounter medications on file as of 11/10/2018.     ALLERGIES: Allergies  Allergen Reactions  . Haloperidol Lactate Anaphylaxis  . Lipitor [Atorvastatin Calcium] Other (See Comments)    Markedly elevated liver enzymes    VACCINATION STATUS: Immunization History  Administered Date(s) Administered  . Influenza,inj,Quad PF,6+ Mos 04/01/2015, 03/16/2016, 01/20/2017, 04/11/2018  . Pneumococcal Polysaccharide-23 05/13/2015    Diabetes She presents for her follow-up diabetic visit. She has type 2 diabetes mellitus. Onset time: He was diagnosed at approximate age of 63 years. Her disease course has been improving. There are no hypoglycemic associated symptoms. Pertinent negatives for hypoglycemia include no confusion, headaches, pallor or seizures. Associated symptoms include polydipsia and polyuria. Pertinent negatives for diabetes include no blurred vision, no chest pain, no fatigue and no polyphagia. There are no hypoglycemic complications. Symptoms  are improving. There are no diabetic complications. Risk factors for coronary artery disease include diabetes mellitus, dyslipidemia, family history, hypertension, obesity, post-menopausal and sedentary lifestyle. She is following a generally unhealthy diet.  When asked about meal planning, she reported none. She has had a previous visit with a dietitian. She never participates in exercise. Her home blood glucose trend is increasing steadily. Her breakfast blood glucose range is generally 180-200 mg/dl. Her dinner blood glucose range is generally 180-200 mg/dl. Her bedtime blood glucose range is generally 180-200 mg/dl. Her overall blood glucose range is 180-200 mg/dl. (  Her most recent labs show A1c of greater than 14%.) An ACE inhibitor/angiotensin II receptor blocker is not being taken. She does not see a podiatrist.Eye exam is not current.  Hyperlipidemia This is a chronic problem. The current episode started more than 1 year ago. The problem is uncontrolled. Exacerbating diseases include diabetes and obesity. Pertinent negatives include no chest pain, myalgias or shortness of breath. Current antihyperlipidemic treatment includes statins. Risk factors for coronary artery disease include diabetes mellitus, dyslipidemia, hypertension, obesity, a sedentary lifestyle and post-menopausal.  Hypertension Pertinent negatives include no blurred vision, chest pain, headaches, palpitations or shortness of breath. Risk factors for coronary artery disease include dyslipidemia, diabetes mellitus, obesity, sedentary lifestyle and post-menopausal state. Past treatments include calcium channel blockers and beta blockers.     Objective:    There were no vitals taken for this visit.  Wt Readings from Last 3 Encounters:  08/09/18 (!) 380 lb (172.4 kg)  08/09/18 (!) 380 lb (172.4 kg)  05/06/18 (!) 379 lb (171.9 kg)       CMP     Component Value Date/Time   NA 133 (L) 08/04/2018 1502   K 4.5 08/04/2018 1502    CL 96 (L) 08/04/2018 1502   CO2 22 08/04/2018 1502   GLUCOSE 336 (H) 08/04/2018 1502   BUN 8 08/04/2018 1502   CREATININE 0.79 08/04/2018 1502   CALCIUM 9.4 08/04/2018 1502   PROT 7.5 08/04/2018 1502   ALBUMIN 3.9 07/13/2016 1430   AST 37 (H) 08/04/2018 1502   ALT 28 08/04/2018 1502   ALKPHOS 61 07/13/2016 1430   BILITOT 0.7 08/04/2018 1502   GFRNONAA 81 08/04/2018 1502   GFRAA 94 08/04/2018 1502     Diabetic Labs (most recent): Lab Results  Component Value Date   HGBA1C >14.0 (H) 08/04/2018   HGBA1C 10.5 (H) 03/23/2018   HGBA1C 9.3 (H) 11/01/2017     Lipid Panel ( most recent) Lipid Panel     Component Value Date/Time   CHOL 183 01/27/2018 1445   TRIG 108 01/27/2018 1445   HDL 40 (L) 01/27/2018 1445   CHOLHDL 4.6 01/27/2018 1445   VLDL 23 07/13/2016 1430   LDLCALC 121 (H) 01/27/2018 1445      Lab Results  Component Value Date   TSH 1.23 05/09/2018   TSH 0.95 07/13/2016   TSH 1.26 07/10/2015   TSH 1.382 11/17/2013   TSH 0.519 04/14/2012   TSH 1.525 11/07/2009      Lipid Panel     Component Value Date/Time   CHOL 183 01/27/2018 1445   TRIG 108 01/27/2018 1445   HDL 40 (L) 01/27/2018 1445   CHOLHDL 4.6 01/27/2018 1445   VLDL 23 07/13/2016 1430   LDLCALC 121 (H) 01/27/2018 1445     Assessment & Plan:   1. Uncontrolled type 2 diabetes mellitus with hyperglycemia ([place])  - Lachelle D Rachels has currently uncontrolled symptomatic type 2 DM since 63 years of age. -She reports continued improvement in her glycemic profile, however still persistently above target both fasting and postprandial times.     Her recent labs show a1c of >  14%.  -her diabetes is complicated by obesity/sedentary life and SHANORA CHRISTENSEN remains at a high risk for more acute and chronic complications which include CAD, CVA, CKD, retinopathy, and neuropathy. These are all discussed in detail with the patient.  - I have counseled her on diet management and weight loss, by  adopting a carbohydrate restricted/protein rich diet.  - she  admits there is a room for improvement in her diet and drink choices. -  Suggestion is made for her to avoid simple carbohydrates  from her diet including Cakes, Sweet Desserts / Pastries, Ice Cream, Soda (diet and regular), Sweet Tea, Candies, Chips, Cookies, Sweet Pastries,  Store Bought Juices, Alcohol in Excess of  1-2 drinks a day, Artificial Sweeteners, Coffee Creamer, and "Sugar-free" Products. This will help patient to have stable blood glucose profile and potentially avoid unintended weight gain.   - I encouraged her to switch to  unprocessed or minimally processed complex starch and increased protein intake (animal or plant source), fruits, and vegetables.  - she is advised to stick to a routine mealtimes to eat 3 meals  a day and avoid unnecessary snacks ( to snack only to correct hypoglycemia).   - she is following with  Jearld Fenton, RDN, CDE for individualized diabetes education.  - I have approached her with the following individualized plan to manage diabetes and patient agrees:   -She may need intensive therapy with basal/bolus insulin, but she has room on her basal insulin before considering bolus insulin.  -She is advised to increase her Tresiba to 80 units twice daily-daily at 8 AM with breakfast and 18 units at bedtime. -She is advised to continue monitoring blood glucose at least 2 times a day, daily before breakfast and at bedtime. -Patient is encouraged to call clinic for blood glucose levels less than 70 or above 200 mg /dl. -She is advised to continue metformin 1000 mg p.o. twice daily. -She will likely require prandial insulin, advised to return in 2 weeks with her meter , repeat labs and logs. -She is advised to continue  glipizide  10 mg XL p.o. daily with breakfast.     2) BP/HTN: she is advised to home monitor blood pressure and report if > 140/90 on 2 separate readings. She is advised to continue  her current medications including spironolactone, metoprolol, amlodipine.   3) Lipids/HPL: Her recent lipid panel showed uncontrolled LDL of 166.   She is advised to continue Crestor 10 mg p.o. nightly.    She will need fasting lipid panel on subsequent visits.   4) Chronic Care/Health Maintenance:  -she  is on Statin medications and  is encouraged to continue to follow up with Ophthalmology, Dentist,  Podiatrist at least yearly or according to recommendations, and advised to   stay away from smoking. I have recommended yearly flu vaccine and pneumonia vaccination at least every 5 years; moderate intensity exercise for up to 150 minutes weekly; and  sleep for at least 7 hours a day.  - I advised patient to maintain close follow up with Tracey Helper, MD for primary care needs.  - Patient Care Time Today:  25 min, of which >50% was spent in  counseling and the rest reviewing her  current and  previous labs/studies, previous treatments, her blood glucose readings, and medications' doses and developing a plan for long-term care based on the latest recommendations for standards of care.   Duane Boston participated in the discussions, expressed understanding, and voiced  agreement with the above plans.  All questions were answered to her satisfaction. she is encouraged to contact clinic should she have any questions or concerns prior to her return visit.  Follow up plan: - Return in about 2 weeks (around 11/24/2018) for Follow up with Pre-visit Labs, Meter, and Logs.  Glade Lloyd, MD [facility] [address] [place], [place] [address] Phone: [phone]  Fax: [phone]    11/10/2018, 5:07 PM  This note was partially dictated with voice recognition software. Similar sounding words can be transcribed inadequately or may not  be corrected upon review.

## 2018-11-18 DIAGNOSIS — E1165 Type 2 diabetes mellitus with hyperglycemia: Secondary | ICD-10-CM | POA: Diagnosis not present

## 2018-11-19 LAB — COMPLETE METABOLIC PANEL WITH GFR
AG Ratio: 1.3 (calc) (ref 1.0–2.5)
ALT: 39 U/L — ABNORMAL HIGH (ref 6–29)
AST: 39 U/L — ABNORMAL HIGH (ref 10–35)
Albumin: 4.3 g/dL (ref 3.6–5.1)
Alkaline phosphatase (APISO): 67 U/L (ref 37–153)
BUN: 10 mg/dL (ref 7–25)
CO2: 23 mmol/L (ref 20–32)
Calcium: 9.6 mg/dL (ref 8.6–10.4)
Chloride: 99 mmol/L (ref 98–110)
Creat: 0.85 mg/dL (ref 0.50–0.99)
GFR, Est African American: 86 mL/min/{1.73_m2} (ref >=60)
GFR, Est Non African American: 74 mL/min/{1.73_m2} (ref >=60)
Globulin: 3.4 g/dL (calc) (ref 1.9–3.7)
Glucose, Bld: 239 mg/dL — ABNORMAL HIGH (ref 65–99)
Potassium: 4.5 mmol/L (ref 3.5–5.3)
Sodium: 137 mmol/L (ref 135–146)
Total Bilirubin: 0.7 mg/dL (ref 0.2–1.2)
Total Protein: 7.7 g/dL (ref 6.1–8.1)

## 2018-11-19 LAB — HEMOGLOBIN A1C
Hgb A1c MFr Bld: 10.9 % of total Hgb — ABNORMAL HIGH (ref <5.7)
Mean Plasma Glucose: 266 (calc)
eAG (mmol/L): 14.7 (calc)

## 2018-11-24 ENCOUNTER — Other Ambulatory Visit: Payer: Self-pay

## 2018-11-24 ENCOUNTER — Encounter: Payer: Self-pay | Admitting: "Endocrinology

## 2018-11-24 ENCOUNTER — Ambulatory Visit (INDEPENDENT_AMBULATORY_CARE_PROVIDER_SITE_OTHER): Payer: Medicare Other | Admitting: "Endocrinology

## 2018-11-24 DIAGNOSIS — E1165 Type 2 diabetes mellitus with hyperglycemia: Secondary | ICD-10-CM

## 2018-11-24 DIAGNOSIS — I10 Essential (primary) hypertension: Secondary | ICD-10-CM | POA: Diagnosis not present

## 2018-11-24 DIAGNOSIS — E782 Mixed hyperlipidemia: Secondary | ICD-10-CM | POA: Diagnosis not present

## 2018-11-24 MED ORDER — ACCU-CHEK SOFTCLIX LANCETS MISC: 5 refills | Status: DC

## 2018-11-24 MED ORDER — TRESIBA FLEXTOUCH 200 UNIT/ML ~~LOC~~ SOPN: 90.0000 [IU] | PEN_INJECTOR | Freq: Two times a day (BID) | SUBCUTANEOUS | 2 refills | Status: DC

## 2018-11-24 MED ORDER — GLUCOSE BLOOD VI STRP: ORAL_STRIP | 5 refills | Status: DC

## 2018-11-24 NOTE — Progress Notes (Signed)
11/24/2018, 9:58 AM                                                    Endocrinology Telehealth Visit Follow up Note -During COVID -19 Pandemic  This visit type was conducted due to national recommendations for restrictions regarding the COVID-19 Pandemic  in an effort to limit this patient's exposure and mitigate transmission of the corona virus.  Due to her co-morbid illnesses, Tracey [name] is at  moderate to high risk for complications without adequate follow up.  This format is felt to be most appropriate for her at this time.  I connected with this patient on 11/24/2018   by telephone and verified that I am speaking with the correct person using two identifiers. Tracey [name], January 29, 1956. she has verbally consented to this visit. All issues noted in this document were discussed and addressed. The format was not optimal for physical exam.    Subjective:    Patient ID: Tracey [name], female    DOB: 05/25/1955.  Tracey [name] is being engaged in telehealth in follow-up  for management of currently uncontrolled symptomatic type 2 diabetes, hyperlipidemia, hypertension. PMD:   Fayrene Helper, MD.  Past Medical History:  Diagnosis Date  . Arthritis   . Diabetes mellitus without complication ([place])   . Diastolic dysfunction 09/9676   Grade 1  . GERD (gastroesophageal reflux disease)   . Hyperlipidemia   . Hyperlipidemia   . Hypertension 04/15/2012  . Morbid obesity ([place])   . PE (pulmonary embolism)   . Pulmonary emboli ([place]) 1999   post TAH  . Sleep apnea    Past Surgical History:  Procedure Laterality Date  . ABDOMINAL HYSTERECTOMY  1999   , totalfor fibroids  . CESAREAN SECTION    . CHOLECYSTECTOMY  1996  . COLONOSCOPY N/A 10/31/2015   Procedure: COLONOSCOPY;  Surgeon: Rogene Houston, MD;  [facility];  Service: Endoscopy;  Laterality:  N/A;  930   Social History   Socioeconomic History  . Marital status: Single    Spouse name: Not on file  . Number of children: Not on file  . Years of education: Not on file  . Highest education level: Not on file  Occupational History  . Not on file  Social Needs  . Financial resource strain: Somewhat hard  . Food insecurity    Worry: Often true    Inability: Often true  . Transportation needs    Medical: No    Non-medical: No  Tobacco Use  . Smoking status: Never Smoker  . Smokeless tobacco: Never Used  Substance and Sexual Activity  . Alcohol use: No    Alcohol/week: 0.0 standard drinks  . Drug use: No  . Sexual activity: Not Currently  Lifestyle  .  Physical activity    Days per week: 0 days    Minutes per session: 0 min  . Stress: To some extent  Relationships  . Social connections    Talks on phone: More than three times a week    Gets together: More than three times a week    Attends religious service: Never    Active member of club or organization: No    Attends meetings of clubs or organizations: Never    Relationship status: Divorced  Other Topics Concern  . Not on file  Social History Narrative  . Not on file   Outpatient Encounter Medications as of 11/24/2018  Medication Sig  . ACCU-CHEK SOFTCLIX LANCETS lancets Use as instructed 4x daily dx E11.65  . aspirin EC 81 MG tablet Take 1 tablet (81 mg total) by mouth daily.  . clotrimazole (GYNE-LOTRIMIN 3) 2 % vaginal cream Place 1 Applicatorful vaginally at bedtime.  . ergocalciferol (VITAMIN D2) 50000 units capsule Take 1 capsule (50,000 Units total) by mouth once a week. One capsule once weekly  . glipiZIDE (GLUCOTROL XL) 10 MG 24 hr tablet Take 1 tablet (10 mg total) by mouth daily with breakfast.  . glucose blood (ACCU-CHEK AVIVA) test strip Use as instructed 4 x daily dx E11.65  . ibuprofen (ADVIL,MOTRIN) 200 MG tablet Take 200 mg by mouth every 8 (eight) hours as needed for moderate pain.   .  Insulin Degludec (TRESIBA FLEXTOUCH) 200 UNIT/ML SOPN Inject 90 Units into the skin 2 (two) times daily at 8 am and 10 pm.  . Insulin Pen Needle (B-D ULTRAFINE III SHORT PEN) 31G X 8 MM MISC 1 each by Does not apply route as directed.  . metFORMIN (GLUCOPHAGE) 1000 MG tablet TAKE ONE TABLET (1000MG  TOTAL) BY MOUTH TWO TIMES DAILY WITH A MEAL  . metoprolol tartrate (LOPRESSOR) 50 MG tablet Take 1 tablet (50 mg total) by mouth 2 (two) times daily.  Marland Kitchen omeprazole (PRILOSEC) 20 MG capsule TAKE ONE CAPSULE BY MOUTH DAILY  . rosuvastatin (CRESTOR) 20 MG tablet TAKE ONE TABLET (20MG  TOTAL) BY MOUTH DAILY  . spironolactone (ALDACTONE) 25 MG tablet TAKE ONE (1) TABLET BY MOUTH EVERY DAY  . UNABLE TO FIND Bedside commode x 1  Dx m76.9, m54.4  . UNABLE TO FIND Shower bench x 1 Shower grab bar x 1  Dx: m76.9, m54.5  . [DISCONTINUED] Insulin Degludec (TRESIBA FLEXTOUCH) 200 UNIT/ML SOPN Inject 80 Units into the skin 2 (two) times daily at 8 am and 10 pm.   No facility-administered encounter medications on file as of 11/24/2018.     ALLERGIES: Allergies  Allergen Reactions  . Haloperidol Lactate Anaphylaxis  . Lipitor [Atorvastatin Calcium] Other (See Comments)    Markedly elevated liver enzymes    VACCINATION STATUS: Immunization History  Administered Date(s) Administered  . Influenza,inj,Quad PF,6+ Mos 04/01/2015, 03/16/2016, 01/20/2017, 04/11/2018  . Pneumococcal Polysaccharide-23 05/13/2015    Diabetes She presents for her follow-up diabetic visit. She has type 2 diabetes mellitus. Onset time: He was diagnosed at approximate age of 63 years. Her disease course has been improving. There are no hypoglycemic associated symptoms. Pertinent negatives for hypoglycemia include no confusion, headaches, pallor or seizures. Pertinent negatives for diabetes include no blurred vision, no chest pain, no fatigue, no polydipsia, no polyphagia and no polyuria. There are no hypoglycemic complications. Symptoms  are improving. There are no diabetic complications. Risk factors for coronary artery disease include diabetes mellitus, dyslipidemia, family history, hypertension, obesity, post-menopausal and sedentary lifestyle. She  is following a generally unhealthy diet. When asked about meal planning, she reported none. She has had a previous visit with a dietitian. She never participates in exercise. Her home blood glucose trend is increasing steadily. Her breakfast blood glucose range is generally 180-200 mg/dl. Her dinner blood glucose range is generally >200 mg/dl. Her bedtime blood glucose range is generally >200 mg/dl. Her overall blood glucose range is >200 mg/dl. An ACE inhibitor/angiotensin II receptor blocker is not being taken. She does not see a podiatrist.Eye exam is not current.  Hyperlipidemia This is a chronic problem. The current episode started more than 1 year ago. The problem is uncontrolled. Exacerbating diseases include diabetes and obesity. Pertinent negatives include no chest pain, myalgias or shortness of breath. Current antihyperlipidemic treatment includes statins. Risk factors for coronary artery disease include diabetes mellitus, dyslipidemia, hypertension, obesity, a sedentary lifestyle and post-menopausal.  Hypertension Pertinent negatives include no blurred vision, chest pain, headaches, palpitations or shortness of breath. Risk factors for coronary artery disease include dyslipidemia, diabetes mellitus, obesity, sedentary lifestyle and post-menopausal state. Past treatments include calcium channel blockers and beta blockers.     Objective:    There were no vitals taken for this visit.  Wt Readings from Last 3 Encounters:  08/09/18 (!) 380 lb (172.4 kg)  08/09/18 (!) 380 lb (172.4 kg)  05/06/18 (!) 379 lb (171.9 kg)       CMP     Component Value Date/Time   NA 137 11/18/2018 1238   K 4.5 11/18/2018 1238   CL 99 11/18/2018 1238   CO2 23 11/18/2018 1238   GLUCOSE 239 (H)  11/18/2018 1238   BUN 10 11/18/2018 1238   CREATININE 0.85 11/18/2018 1238   CALCIUM 9.6 11/18/2018 1238   PROT 7.7 11/18/2018 1238   ALBUMIN 3.9 07/13/2016 1430   AST 39 (H) 11/18/2018 1238   ALT 39 (H) 11/18/2018 1238   ALKPHOS 61 07/13/2016 1430   BILITOT 0.7 11/18/2018 1238   GFRNONAA 74 11/18/2018 1238   GFRAA 86 11/18/2018 1238     Diabetic Labs (most recent): Lab Results  Component Value Date   HGBA1C 10.9 (H) 11/18/2018   HGBA1C >14.0 (H) 08/04/2018   HGBA1C 10.5 (H) 03/23/2018     Lipid Panel ( most recent) Lipid Panel     Component Value Date/Time   CHOL 183 01/27/2018 1445   TRIG 108 01/27/2018 1445   HDL 40 (L) 01/27/2018 1445   CHOLHDL 4.6 01/27/2018 1445   VLDL 23 07/13/2016 1430   LDLCALC 121 (H) 01/27/2018 1445      Lab Results  Component Value Date   TSH 1.23 05/09/2018   TSH 0.95 07/13/2016   TSH 1.26 07/10/2015   TSH 1.382 11/17/2013   TSH 0.519 04/14/2012   TSH 1.525 11/07/2009      Lipid Panel     Component Value Date/Time   CHOL 183 01/27/2018 1445   TRIG 108 01/27/2018 1445   HDL 40 (L) 01/27/2018 1445   CHOLHDL 4.6 01/27/2018 1445   VLDL 23 07/13/2016 1430   LDLCALC 121 (H) 01/27/2018 1445     Assessment & Plan:   1. Uncontrolled type 2 diabetes mellitus with hyperglycemia ([place])  - Natara D Betsill has currently uncontrolled symptomatic type 2 DM since 63 years of age. -She reports continued improvement in her glycemic profile, however still persistently above target both fasting and postprandial times.     Her recent labs show a1c of 10.9% improving from >14%.  -her diabetes  is complicated by obesity/sedentary life and FINLAY MILLS remains at a high risk for more acute and chronic complications which include CAD, CVA, CKD, retinopathy, and neuropathy. These are all discussed in detail with the patient.  - I have counseled her on diet management and weight loss, by adopting a carbohydrate restricted/protein rich  diet.  - she  admits there is a room for improvement in her diet and drink choices. -  Suggestion is made for her to avoid simple carbohydrates  from her diet including Cakes, Sweet Desserts / Pastries, Ice Cream, Soda (diet and regular), Sweet Tea, Candies, Chips, Cookies, Sweet Pastries,  Store Bought Juices, Alcohol in Excess of  1-2 drinks a day, Artificial Sweeteners, Coffee Creamer, and "Sugar-free" Products. This will help patient to have stable blood glucose profile and potentially avoid unintended weight gain.   - I encouraged her to switch to  unprocessed or minimally processed complex starch and increased protein intake (animal or plant source), fruits, and vegetables.  - she is advised to stick to a routine mealtimes to eat 3 meals  a day and avoid unnecessary snacks ( to snack only to correct hypoglycemia).   - she is following with  Jearld Fenton, RDN, CDE for individualized diabetes education.  - I have approached her with the following individualized plan to manage diabetes and patient agrees:   -She may need intensive therapy with basal/bolus insulin, but she has room on her basal insulin before considering bolus insulin.  -She is advised to increase her Tresiba to 90 units twice daily-daily at 8 AM with breakfast and 90 units at bedtime. -She is advised to continue monitoring blood glucose at least 2 times a day, daily before breakfast and at bedtime. -Patient is encouraged to call clinic for blood glucose levels less than 70 or above 200 mg /dl. -She is advised to continue metformin 1000 mg p.o. twice daily.  -She is advised to continue  glipizide  10 mg XL p.o. daily with breakfast.     2) BP/HTN: she is advised to home monitor blood pressure and report if > 140/90 on 2 separate readings.  She is advised to continue her current medications including spironolactone, metoprolol, amlodipine.   3) Lipids/HPL: Her recent lipid panel showed uncontrolled LDL of 166.   She  is advised to continue Crestor 10 mg p.o. nightly.     She will need fasting lipid panel on subsequent visits.   4) Chronic Care/Health Maintenance:  -she  is on Statin medications and  is encouraged to continue to follow up with Ophthalmology, Dentist,  Podiatrist at least yearly or according to recommendations, and advised to   stay away from smoking. I have recommended yearly flu vaccine and pneumonia vaccination at least every 5 years; moderate intensity exercise for up to 150 minutes weekly; and  sleep for at least 7 hours a day.  - I advised patient to maintain close follow up with Fayrene Helper, MD for primary care needs.  - Patient Care Time Today:  25 min, of which >50% was spent in  counseling and the rest reviewing her  current and  previous labs/studies, previous treatments, her blood glucose readings, and medications' doses and developing a plan for long-term care based on the latest recommendations for standards of care.   Tracey [name] participated in the discussions, expressed understanding, and voiced agreement with the above plans.  All questions were answered to her satisfaction. she is encouraged to contact clinic should she  have any questions or concerns prior to her return visit.   Follow up plan: - Return in about 3 months (around 02/24/2019), or logs 6, for Bring Meter and Logs- A1c in Office.  Glade Lloyd, MD [facility] [address] [place], [place] [address] Phone: [phone]  Fax: [phone]    11/24/2018, 9:58 AM  This note was partially dictated with voice recognition software. Similar sounding words can be transcribed inadequately or may not  be corrected upon review.

## 2018-11-29 ENCOUNTER — Ambulatory Visit (INDEPENDENT_AMBULATORY_CARE_PROVIDER_SITE_OTHER): Payer: Medicare Other | Admitting: Family Medicine

## 2018-11-29 ENCOUNTER — Other Ambulatory Visit: Payer: Self-pay

## 2018-11-29 ENCOUNTER — Encounter: Payer: Self-pay | Admitting: Family Medicine

## 2018-11-29 VITALS — BP 142/84 | HR 96 | Resp 14 | Ht 70.0 in | Wt 380.0 lb

## 2018-11-29 DIAGNOSIS — Z0000 Encounter for general adult medical examination without abnormal findings: Secondary | ICD-10-CM

## 2018-11-29 NOTE — Progress Notes (Signed)
Subjective:   Tracey [name] is a 63 y.o. female who presents for Medicare Annual (Subsequent) preventive examination.  Location of Patient: Home Location of Provider: Telehealth Consent was obtain for visit to be over via telehealth. I verified that I am speaking with the correct person using two identifiers.   Review of Systems:    Cardiac Risk Factors include: diabetes mellitus;dyslipidemia;hypertension;obesity (BMI >30kg/m2)     Objective:     Vitals: BP (!) 142/84   Pulse 96   Resp 14   Ht 5\' 10"  (1.778 m)   Wt (!) 380 lb (172.4 kg)   BMI 54.52 kg/m   Body mass index is 54.52 kg/m.  Advanced Directives 11/22/2017 11/16/2016 10/22/2016 05/12/2016 02/10/2016 10/31/2015 05/09/2015  Does Patient Have a Medical Advance Directive? No No No No No No No  Would patient like information on creating a medical advance directive? Yes (ED - Information included in AVS) Yes (MAU/Ambulatory/Procedural Areas - Information given) - - - No - patient declined information No - patient declined information    Tobacco Social History   Tobacco Use  Smoking Status Never Smoker  Smokeless Tobacco Never Used     Counseling given: Yes   Clinical Intake:  Pre-visit preparation completed: Yes  Pain : No/denies pain Pain Score: 0-No pain     BMI - recorded: 54.52 Nutritional Status: BMI > 30  Obese Nutritional Risks: None Diabetes: Yes CBG done?: No Did pt. bring in CBG monitor from home?: No  How often do you need to have someone help you when you read instructions, pamphlets, or other written materials from your doctor or pharmacy?: 1 - Never What is the last grade level you completed in school?: 12  Interpreter Needed?: No     Past Medical History:  Diagnosis Date  . Arthritis   . Diabetes mellitus without complication ([place])   . Diastolic dysfunction 09/7122   Grade 1  . GERD (gastroesophageal reflux disease)   . Hyperlipidemia   . Hyperlipidemia   . Hypertension  04/15/2012  . Morbid obesity ([place])   . PE (pulmonary embolism)   . Pulmonary emboli ([place]) 1999   post TAH  . Sleep apnea    Past Surgical History:  Procedure Laterality Date  . ABDOMINAL HYSTERECTOMY  1999   , totalfor fibroids  . CESAREAN SECTION    . CHOLECYSTECTOMY  1996  . COLONOSCOPY N/A 10/31/2015   Procedure: COLONOSCOPY;  Surgeon: Rogene Houston, MD;  [facility];  Service: Endoscopy;  Laterality: N/A;  930   Family History  Problem Relation Age of Onset  . Diabetes Mother   . Hypertension Mother   . Depression Mother   . Hyperlipidemia Father   . Hypertension Father   . Alcohol abuse Father   . Cirrhosis Father   . Aneurysm Brother   . Sleep apnea Sister   . Arthritis Sister   . Coronary artery disease Sister   . Heart attack Sister 43  . Heart murmur Son   . Hypertension Son   . Depression Son    Social History   Socioeconomic History  . Marital status: Single    Spouse name: Not on file  . Number of children: Not on file  . Years of education: Not on file  . Highest education level: Not on file  Occupational History  . Not on file  Social Needs  . Financial resource strain: Somewhat hard  . Food insecurity    Worry: Often true  Inability: Often true  . Transportation needs    Medical: No    Non-medical: No  Tobacco Use  . Smoking status: Never Smoker  . Smokeless tobacco: Never Used  Substance and Sexual Activity  . Alcohol use: No    Alcohol/week: 0.0 standard drinks  . Drug use: No  . Sexual activity: Not Currently  Lifestyle  . Physical activity    Days per week: 0 days    Minutes per session: 0 min  . Stress: To some extent  Relationships  . Social connections    Talks on phone: More than three times a week    Gets together: More than three times a week    Attends religious service: Never    Active member of club or organization: No    Attends meetings of clubs or organizations: Never    Relationship status: Divorced   Other Topics Concern  . Not on file  Social History Narrative  . Not on file    Outpatient Encounter Medications as of 11/29/2018  Medication Sig  . Accu-Chek Softclix Lancets lancets Use as instructed 4x daily dx E11.65  . aspirin EC 81 MG tablet Take 1 tablet (81 mg total) by mouth daily.  . clotrimazole (GYNE-LOTRIMIN 3) 2 % vaginal cream Place 1 Applicatorful vaginally at bedtime.  . ergocalciferol (VITAMIN D2) 50000 units capsule Take 1 capsule (50,000 Units total) by mouth once a week. One capsule once weekly  . glipiZIDE (GLUCOTROL XL) 10 MG 24 hr tablet Take 1 tablet (10 mg total) by mouth daily with breakfast.  . glucose blood (ACCU-CHEK AVIVA) test strip Use as instructed 4 x daily dx E11.65  . ibuprofen (ADVIL,MOTRIN) 200 MG tablet Take 200 mg by mouth every 8 (eight) hours as needed for moderate pain.   . Insulin Degludec (TRESIBA FLEXTOUCH) 200 UNIT/ML SOPN Inject 90 Units into the skin 2 (two) times daily at 8 am and 10 pm.  . Insulin Pen Needle (B-D ULTRAFINE III SHORT PEN) 31G X 8 MM MISC 1 each by Does not apply route as directed.  . metFORMIN (GLUCOPHAGE) 1000 MG tablet TAKE ONE TABLET (1000MG  TOTAL) BY MOUTH TWO TIMES DAILY WITH A MEAL  . metoprolol tartrate (LOPRESSOR) 50 MG tablet Take 1 tablet (50 mg total) by mouth 2 (two) times daily.  Marland Kitchen omeprazole (PRILOSEC) 20 MG capsule TAKE ONE CAPSULE BY MOUTH DAILY  . rosuvastatin (CRESTOR) 20 MG tablet TAKE ONE TABLET (20MG  TOTAL) BY MOUTH DAILY  . spironolactone (ALDACTONE) 25 MG tablet TAKE ONE (1) TABLET BY MOUTH EVERY DAY  . UNABLE TO FIND Bedside commode x 1  Dx m76.9, m54.4  . UNABLE TO FIND Shower bench x 1 Shower grab bar x 1  Dx: m76.9, m54.5   No facility-administered encounter medications on file as of 11/29/2018.     Activities of Daily Living In your present state of health, do you have any difficulty performing the following activities: 11/29/2018  Hearing? N  Vision? N  Difficulty concentrating or making  decisions? N  Walking or climbing stairs? Y  Dressing or bathing? N  Doing errands, shopping? Y  Preparing Food and eating ? N  Using the Toilet? N  In the past six months, have you accidently leaked urine? N  Do you have problems with loss of bowel control? N  Managing your Medications? N  Managing your Finances? N  Housekeeping or managing your Housekeeping? N  Some recent data might be hidden    Patient Care Team:  Fayrene Helper, MD as PCP - General (Family Medicine) Fay Records, MD as PCP - Cardiology (Cardiology) Fay Records, MD as Consulting Physician (Cardiology) Phillips Odor, MD as Consulting Physician (Neurology) Rutherford Guys, MD as Consulting Physician (Ophthalmology)    Assessment:   This is a routine wellness examination for Bridgette.  Exercise Activities and Dietary recommendations Current Exercise Habits: Home exercise routine, Type of exercise: walking, Time (Minutes): 20, Frequency (Times/Week): 7, Weekly Exercise (Minutes/Week): 140, Intensity: Mild, Exercise limited by: orthopedic condition(s);cardiac condition(s)  Goals    . Exercise 3x per week (30 min per time)     Recommend starting a routine exercise program at least 3 days a week for 30-45 minutes at a time as tolerated.         Fall Risk Fall Risk  11/29/2018 08/09/2018 08/09/2018 04/11/2018 03/21/2018  Falls in the past year? 1 1 0 1 -  Number falls in past yr: 0 1 - 1 -  Comment - - - - -  Injury with Fall? 1 0 - 1 -  Comment - - - - -  Risk for fall due to : - - - - Impaired balance/gait;Impaired mobility  Follow up - - Falls evaluation completed - -   Is the patient's home free of loose throw rugs in walkways, pet beds, electrical cords, etc?   yes      Grab bars in the bathroom? yes      Handrails on the stairs?   yes      Adequate lighting?   yes     Depression Screen PHQ 2/9 Scores 11/29/2018 04/11/2018 03/21/2018 12/14/2017  PHQ - 2 Score 0 4 2 0  PHQ- 9 Score - 9 11 -      Cognitive Function     6CIT Screen 11/29/2018 11/22/2017 11/16/2016  What Year? 0 points 0 points 0 points  What month? 0 points 0 points 0 points  What time? 0 points 0 points 0 points  Count back from 20 0 points - 0 points  Months in reverse 0 points - 0 points  Repeat phrase 0 points - 0 points  Total Score 0 - 0    Immunization History  Administered Date(s) Administered  . Influenza,inj,Quad PF,6+ Mos 04/01/2015, 03/16/2016, 01/20/2017, 04/11/2018  . Pneumococcal Polysaccharide-23 05/13/2015    Qualifies for Shingles Vaccine? Checking with insurance coveraged  Screening Tests Health Maintenance  Topic Date Due  . TETANUS/TDAP  06/07/1976  . FOOT EXAM  08/31/2018  . OPHTHALMOLOGY EXAM  10/16/2018  . INFLUENZA VACCINE  11/12/2018  . URINE MICROALBUMIN  04/13/2019  . HEMOGLOBIN A1C  05/21/2019  . PAP SMEAR-Modifier  07/16/2019  . MAMMOGRAM  09/03/2019  . COLONOSCOPY  10/30/2025  . PNEUMOCOCCAL POLYSACCHARIDE VACCINE AGE 53-64 HIGH RISK  Completed  . Hepatitis C Screening  Completed  . HIV Screening  Completed    Cancer Screenings: Lung: Low Dose CT Chest recommended if Age 47-80 years, 30 pack-year currently smoking OR have quit w/in 15years. Patient does not qualify. Breast:  Up to date on Mammogram? Yes   Up to date of Bone Density/Dexa? No n/a  Colorectal: Due 2027  Additional Screenings:   Hepatitis C Screening: completed     Plan:        1. Encounter for Medicare annual wellness exam   I have personally reviewed and noted the following in the patient's chart:   . Medical and social history . Use of alcohol, tobacco or illicit drugs  .  Current medications and supplements . Functional ability and status . Nutritional status . Physical activity . Advanced directives . List of other physicians . Hospitalizations, surgeries, and ER visits in previous 12 months . Vitals . Screenings to include cognitive, depression, and falls . Referrals and  appointments  In addition, I have reviewed and discussed with patient certain preventive protocols, quality metrics, and best practice recommendations. A written personalized care plan for preventive services as well as general preventive health recommendations were provided to patient.     Perlie Mayo, NP  11/29/2018

## 2018-11-29 NOTE — Patient Instructions (Signed)
[name] , Thank you for taking time to come for your Medicare Wellness Visit. I appreciate your ongoing commitment to your health goals. Please review the following plan we discussed and let me know if I can assist you in the future.   Please continue to practice social distancing to keep you, your family, and our community safe.  If you must go out, please wear a Mask and practice good handwashing.  Screening recommendations/referrals: Colonoscopy: Due 2027 Mammogram: Due 2020 Bone Density: N/a Recommended yearly ophthalmology/optometry visit for glaucoma screening and checkup Recommended yearly dental visit for hygiene and checkup  Vaccinations: Influenza vaccine: Due Fall 2020 Pneumococcal vaccine: Has had one  Tdap vaccine: check with insurance Shingles vaccine: check with insurance   Advanced directives: No legal paperwork at this time, daughters know wishes  Conditions/risks identified: Falls   Next appointment: 02/08/2019   Preventive Care 40-64 Years, Female Preventive care refers to lifestyle choices and visits with your health care provider that can promote health and wellness. What does preventive care include?  A yearly physical exam. This is also called an annual well check.  Dental exams once or twice a year.  Routine eye exams. Ask your health care provider how often you should have your eyes checked.  Personal lifestyle choices, including:  Daily care of your teeth and gums.  Regular physical activity.  Eating a healthy diet.  Avoiding tobacco and drug use.  Limiting alcohol use.  Practicing safe sex.  Taking low-dose aspirin daily starting at age 61.  Taking vitamin and mineral supplements as recommended by your health care provider. What happens during an annual well check? The services and screenings done by your health care provider during your annual well check will depend on your age, overall health, lifestyle risk factors, and family  history of disease. Counseling  Your health care provider may ask you questions about your:  Alcohol use.  Tobacco use.  Drug use.  Emotional well-being.  Home and relationship well-being.  Sexual activity.  Eating habits.  Work and work Statistician.  Method of birth control.  Menstrual cycle.  Pregnancy history. Screening  You may have the following tests or measurements:  Height, weight, and BMI.  Blood pressure.  Lipid and cholesterol levels. These may be checked every 5 years, or more frequently if you are over 90 years old.  Skin check.  Lung cancer screening. You may have this screening every year starting at age 37 if you have a 30-pack-year history of smoking and currently smoke or have quit within the past 15 years.  Fecal occult blood test (FOBT) of the stool. You may have this test every year starting at age 19.  Flexible sigmoidoscopy or colonoscopy. You may have a sigmoidoscopy every 5 years or a colonoscopy every 10 years starting at age 17.  Hepatitis C blood test.  Hepatitis B blood test.  Sexually transmitted disease (STD) testing.  Diabetes screening. This is done by checking your blood sugar (glucose) after you have not eaten for a while (fasting). You may have this done every 1-3 years.  Mammogram. This may be done every 1-2 years. Talk to your health care provider about when you should start having regular mammograms. This may depend on whether you have a family history of breast cancer.  BRCA-related cancer screening. This may be done if you have a family history of breast, ovarian, tubal, or peritoneal cancers.  Pelvic exam and Pap test. This may be done every 3 years starting  at age 40. Starting at age 8, this may be done every 5 years if you have a Pap test in combination with an HPV test.  Bone density scan. This is done to screen for osteoporosis. You may have this scan if you are at high risk for osteoporosis. Discuss your test  results, treatment options, and if necessary, the need for more tests with your health care provider. Vaccines  Your health care provider may recommend certain vaccines, such as:  Influenza vaccine. This is recommended every year.  Tetanus, diphtheria, and acellular pertussis (Tdap, Td) vaccine. You may need a Td booster every 10 years.  Zoster vaccine. You may need this after age 50.  Pneumococcal 13-valent conjugate (PCV13) vaccine. You may need this if you have certain conditions and were not previously vaccinated.  Pneumococcal polysaccharide (PPSV23) vaccine. You may need one or two doses if you smoke cigarettes or if you have certain conditions. Talk to your health care provider about which screenings and vaccines you need and how often you need them. This information is not intended to replace advice given to you by your health care provider. Make sure you discuss any questions you have with your health care provider. Document Released: 04/26/2015 Document Revised: 12/18/2015 Document Reviewed: 01/29/2015 Elsevier Interactive Patient Education  2017 [employer] Prevention in the Home Falls can cause injuries. They can happen to people of all ages. There are many things you can do to make your home safe and to help prevent falls. What can I do on the outside of my home?  Regularly fix the edges of walkways and driveways and fix any cracks.  Remove anything that might make you trip as you walk through a door, such as a raised step or threshold.  Trim any bushes or trees on the path to your home.  Use bright outdoor lighting.  Clear any walking paths of anything that might make someone trip, such as rocks or tools.  Regularly check to see if handrails are loose or broken. Make sure that both sides of any steps have handrails.  Any raised decks and porches should have guardrails on the edges.  Have any leaves, snow, or ice cleared regularly.  Use sand or salt on  walking paths during winter.  Clean up any spills in your garage right away. This includes oil or grease spills. What can I do in the bathroom?  Use night lights.  Install grab bars by the toilet and in the tub and shower. Do not use towel bars as grab bars.  Use non-skid mats or decals in the tub or shower.  If you need to sit down in the shower, use a plastic, non-slip stool.  Keep the floor dry. Clean up any water that spills on the floor as soon as it happens.  Remove soap buildup in the tub or shower regularly.  Attach bath mats securely with double-sided non-slip rug tape.  Do not have throw rugs and other things on the floor that can make you trip. What can I do in the bedroom?  Use night lights.  Make sure that you have a light by your bed that is easy to reach.  Do not use any sheets or blankets that are too big for your bed. They should not hang down onto the floor.  Have a firm chair that has side arms. You can use this for support while you get dressed.  Do not have throw rugs and other  things on the floor that can make you trip. What can I do in the kitchen?  Clean up any spills right away.  Avoid walking on wet floors.  Keep items that you use a lot in easy-to-reach places.  If you need to reach something above you, use a strong step stool that has a grab bar.  Keep electrical cords out of the way.  Do not use floor polish or wax that makes floors slippery. If you must use wax, use non-skid floor wax.  Do not have throw rugs and other things on the floor that can make you trip. What can I do with my stairs?  Do not leave any items on the stairs.  Make sure that there are handrails on both sides of the stairs and use them. Fix handrails that are broken or loose. Make sure that handrails are as long as the stairways.  Check any carpeting to make sure that it is firmly attached to the stairs. Fix any carpet that is loose or worn.  Avoid having throw  rugs at the top or bottom of the stairs. If you do have throw rugs, attach them to the floor with carpet tape.  Make sure that you have a light switch at the top of the stairs and the bottom of the stairs. If you do not have them, ask someone to add them for you. What else can I do to help prevent falls?  Wear shoes that:  Do not have high heels.  Have rubber bottoms.  Are comfortable and fit you well.  Are closed at the toe. Do not wear sandals.  If you use a stepladder:  Make sure that it is fully opened. Do not climb a closed stepladder.  Make sure that both sides of the stepladder are locked into place.  Ask someone to hold it for you, if possible.  Clearly mark and make sure that you can see:  Any grab bars or handrails.  First and last steps.  Where the edge of each step is.  Use tools that help you move around (mobility aids) if they are needed. These include:  Canes.  Walkers.  Scooters.  Crutches.  Turn on the lights when you go into a dark area. Replace any light bulbs as soon as they burn out.  Set up your furniture so you have a clear path. Avoid moving your furniture around.  If any of your floors are uneven, fix them.  If there are any pets around you, be aware of where they are.  Review your medicines with your doctor. Some medicines can make you feel dizzy. This can increase your chance of falling. Ask your doctor what other things that you can do to help prevent falls. This information is not intended to replace advice given to you by your health care provider. Make sure you discuss any questions you have with your health care provider. Document Released: 01/24/2009 Document Revised: 09/05/2015 Document Reviewed: 05/04/2014 Elsevier Interactive Patient Education  2017 [employer].

## 2018-12-17 ENCOUNTER — Other Ambulatory Visit: Payer: Self-pay | Admitting: "Endocrinology

## 2018-12-20 ENCOUNTER — Other Ambulatory Visit: Payer: Self-pay | Admitting: "Endocrinology

## 2018-12-20 ENCOUNTER — Telehealth: Payer: Self-pay

## 2018-12-20 MED ORDER — TRESIBA FLEXTOUCH 200 UNIT/ML ~~LOC~~ SOPN: 90.0000 [IU] | PEN_INJECTOR | Freq: Two times a day (BID) | SUBCUTANEOUS | 2 refills | Status: DC

## 2018-12-20 NOTE — Telephone Encounter (Signed)
Dr Dorris Fetch, Could you send this in for me? Tracey [name]. [place]

## 2018-12-20 NOTE — Telephone Encounter (Signed)
Tracey [name], CMA  

## 2018-12-20 NOTE — Telephone Encounter (Signed)
Pharmacy called and said that its still not coming through. It says "fill later" on the RX.

## 2018-12-21 NOTE — Telephone Encounter (Signed)
Jonni Sanger at [facility] needs the pen tips for the tresiba

## 2018-12-21 NOTE — Telephone Encounter (Signed)
Rx sent 

## 2018-12-26 ENCOUNTER — Ambulatory Visit: Payer: Medicare Other

## 2018-12-26 ENCOUNTER — Other Ambulatory Visit: Payer: Self-pay | Admitting: Family Medicine

## 2018-12-26 ENCOUNTER — Other Ambulatory Visit: Payer: Self-pay

## 2018-12-27 ENCOUNTER — Other Ambulatory Visit: Payer: Self-pay

## 2018-12-27 MED ORDER — ACCU-CHEK SOFTCLIX LANCETS MISC: 5 refills | Status: DC

## 2018-12-27 MED ORDER — GLUCOSE BLOOD VI STRP: ORAL_STRIP | 5 refills | Status: DC

## 2019-01-03 ENCOUNTER — Other Ambulatory Visit: Payer: Self-pay | Admitting: Family Medicine

## 2019-02-01 ENCOUNTER — Telehealth: Payer: Self-pay | Admitting: Family Medicine

## 2019-02-01 NOTE — Telephone Encounter (Signed)
Pt needs a A1C ordered per automated call

## 2019-02-01 NOTE — Telephone Encounter (Signed)
This has been ordered and is not due again at this time

## 2019-02-06 ENCOUNTER — Other Ambulatory Visit: Payer: Self-pay | Admitting: "Endocrinology

## 2019-02-08 ENCOUNTER — Ambulatory Visit (INDEPENDENT_AMBULATORY_CARE_PROVIDER_SITE_OTHER): Payer: Medicare Other | Admitting: Family Medicine

## 2019-02-08 ENCOUNTER — Encounter: Payer: Self-pay | Admitting: Family Medicine

## 2019-02-08 ENCOUNTER — Other Ambulatory Visit: Payer: Self-pay

## 2019-02-08 VITALS — BP 142/84 | Ht 70.0 in | Wt 380.0 lb

## 2019-02-08 DIAGNOSIS — E1165 Type 2 diabetes mellitus with hyperglycemia: Secondary | ICD-10-CM

## 2019-02-08 DIAGNOSIS — I10 Essential (primary) hypertension: Secondary | ICD-10-CM | POA: Diagnosis not present

## 2019-02-08 DIAGNOSIS — Z1231 Encounter for screening mammogram for malignant neoplasm of breast: Secondary | ICD-10-CM | POA: Diagnosis not present

## 2019-02-08 DIAGNOSIS — E782 Mixed hyperlipidemia: Secondary | ICD-10-CM | POA: Diagnosis not present

## 2019-02-08 NOTE — Progress Notes (Signed)
Virtual Visit via Telephone Note  I connected with Tracey [name] on 02/08/19 at  3:00 PM EDT by telephone and verified that I am speaking with the correct person using two identifiers.  Location: Patient: home Provider: office   I discussed the limitations, risks, security and privacy concerns of performing an evaluation and management service by telephone and the availability of in person appointments. I also discussed with the patient that there may be a patient responsible charge related to this service. The patient expressed understanding and agreed to proceed.   History of Present Illness: F/U chronic medical problems, update medication, labs and preventive health states she is doing well , except that her blood sugar is still uncontrolled and elevated. Denies polyuria, polydipsia, blurred vision , or hypoglycemic episodes.  Denies recent fever or chills. Denies sinus pressure, nasal congestion, ear pain or sore throat. Denies chest congestion, productive cough or wheezing. Denies chest pains, palpitations and leg swelling Denies abdominal pain, nausea, vomiting,diarrhea or constipation.   Denies dysuria, frequency, hesitancy or incontinence. Denies joint pain, swelling and limitation in mobility. Denies headaches, seizures, numbness, or tingling. Denies depression, anxiety or insomnia. Denies skin break down or rash.       Observations/Objective: BP (!) 142/84   Ht 5\' 10"  (1.778 m)   Wt (!) 380 lb (172.4 kg)   BMI 54.52 kg/m  Good communication with no confusion and intact memory. Alert and oriented x 3 No signs of respiratory distress during speech    Assessment and Plan: Uncontrolled type 2 diabetes mellitus with hyperglycemia ([place]) Uncontrolled and followed by Endo [name] is reminded of the importance of commitment to daily physical activity for 30 minutes or more, as able and the need to limit carbohydrate intake to 30 to 60 grams per meal to help with  blood sugar control.   The need to take medication as prescribed, test blood sugar as directed, and to call between visits if there is a concern that blood sugar is uncontrolled is also discussed.   [name] is reminded of the importance of daily foot exam, annual eye examination, and good blood sugar, blood pressure and cholesterol control.  Diabetic Labs Latest Ref Rng & Units 11/18/2018 08/04/2018 04/12/2018 03/23/2018 01/27/2018  HbA1c <5.7 % of total Hgb 10.9(H) >14.0(H) - 10.5(H) -  Microalbumin Not Estab. ug/mL - - 63.8(H) - -  Micro/Creat Ratio 0.0 - 30.0 mg/g creat - - 27.8 - -  Chol <200 mg/dL - - - - 183  HDL >50 mg/dL - - - - 40(L)  Calc LDL mg/dL (calc) - - - - 121(H)  Triglycerides <150 mg/dL - - - - 108  Creatinine 0.50 - 0.99 mg/dL 0.85 0.79 - - 0.80   BP/Weight 02/08/2019 11/29/2018 08/09/2018 08/09/2018 05/06/2018 04/11/2018 [id number]  Systolic BP A999333 A999333 A999333 A999333 [id number] [id number]  Diastolic BP 84 84 84 84 80 78 88  Wt. (Lbs) 380 380 380 380 379 381.08 384  BMI 54.52 54.52 54.52 54.52 54.38 54.68 55.1   Foot/eye exam completion dates Latest Ref Rng & Units 08/19/2016 08/19/2016  Eye Exam No Retinopathy No Retinopathy No Retinopathy  Foot Form Completion - - -        Essential hypertension, benign Elevated from record, need to assess in office next week DASH diet and commitment to daily physical activity for a minimum of 30 minutes discussed and encouraged, as a part of hypertension management. The importance of attaining a healthy weight is also discussed.  BP/Weight 02/08/2019 11/29/2018 08/09/2018 08/09/2018 05/06/2018 04/11/2018 123456  Systolic BP A999333 A999333 A999333 A999333 [id number] [id number] Q000111Q  Diastolic BP 84 84 84 84 80 78 88  Wt. (Lbs) 380 380 380 380 379 381.08 384  BMI 54.52 54.52 54.52 54.52 54.38 54.68 55.1       Morbid obesity  Patient re-educated about  the importance of commitment to a  minimum of 150 minutes of exercise per week as able.  The importance of healthy  food choices with portion control discussed, as well as eating regularly and within a 12 hour window most days. The need to choose "clean , green" food 50 to 75% of the time is discussed, as well as to make water the primary drink and set a goal of 64 ounces water daily.    Weight /BMI 02/08/2019 11/29/2018 08/09/2018  WEIGHT 380 lb 380 lb 380 lb  HEIGHT 5\' 10"  5\' 10"  5\' 10"   BMI 54.52 kg/m2 54.52 kg/m2 54.52 kg/m2      Mixed hyperlipidemia Hyperlipidemia:Low fat diet discussed and encouraged.   Lipid Panel  Lab Results  Component Value Date   CHOL 183 01/27/2018   HDL 40 (L) 01/27/2018   LDLCALC 121 (H) 01/27/2018   TRIG 108 01/27/2018   CHOLHDL 4.6 01/27/2018   Updated lab needed at/ before next visit.       Follow Up Instructions:    I discussed the assessment and treatment plan with the patient. The patient was provided an opportunity to ask questions and all were answered. The patient agreed with the plan and demonstrated an understanding of the instructions.   The patient was advised to call back or seek an in-person evaluation if the symptoms worsen or if the condition fails to improve as anticipated.  I provided 22 minutes of non-face-to-face time during this encounter.   Tula Nakayama, MD

## 2019-02-08 NOTE — Assessment & Plan Note (Signed)
Uncontrolled and followed by Endo Tracey [name] is reminded of the importance of commitment to daily physical activity for 30 minutes or more, as able and the need to limit carbohydrate intake to 30 to 60 grams per meal to help with blood sugar control.   The need to take medication as prescribed, test blood sugar as directed, and to call between visits if there is a concern that blood sugar is uncontrolled is also discussed.   Tracey [name] is reminded of the importance of daily foot exam, annual eye examination, and good blood sugar, blood pressure and cholesterol control.  Diabetic Labs Latest Ref Rng & Units 11/18/2018 08/04/2018 04/12/2018 03/23/2018 01/27/2018  HbA1c <5.7 % of total Hgb 10.9(H) >14.0(H) - 10.5(H) -  Microalbumin Not Estab. ug/mL - - 63.8(H) - -  Micro/Creat Ratio 0.0 - 30.0 mg/g creat - - 27.8 - -  Chol <200 mg/dL - - - - 183  HDL >50 mg/dL - - - - 40(L)  Calc LDL mg/dL (calc) - - - - 121(H)  Triglycerides <150 mg/dL - - - - 108  Creatinine 0.50 - 0.99 mg/dL 0.85 0.79 - - 0.80   BP/Weight 02/08/2019 11/29/2018 08/09/2018 08/09/2018 05/06/2018 04/11/2018 123456  Systolic BP A999333 A999333 A999333 A999333 [id number] 123456 Q000111Q  Diastolic BP 84 84 84 84 80 78 88  Wt. (Lbs) 380 380 380 380 379 381.08 384  BMI 54.52 54.52 54.52 54.52 54.38 54.68 55.1   Foot/eye exam completion dates Latest Ref Rng & Units 08/19/2016 08/19/2016  Eye Exam No Retinopathy No Retinopathy No Retinopathy  Foot Form Completion - - -

## 2019-02-08 NOTE — Patient Instructions (Addendum)
F/u in  End February, call if you need me sooner  Pls change day she comes for flu vaccine, needs foot exam which I will do as an addennd to the visit ( nurse aware and will arrange)  Please schedule mammogram at checkout  You are referred for diabetic eye exam we will schedule appointment if able, if mot you need to call  Need fasting lipid, cmp and eGFr, vit D  With next lab draw in November ( for Dr Dorris Fetch)  Thanks for choosing [facility], we consider it a privelige to serve you.

## 2019-02-10 ENCOUNTER — Ambulatory Visit: Payer: Medicare Other

## 2019-02-12 ENCOUNTER — Encounter: Payer: Self-pay | Admitting: Family Medicine

## 2019-02-12 NOTE — Assessment & Plan Note (Signed)
  Patient re-educated about  the importance of commitment to a  minimum of 150 minutes of exercise per week as able.  The importance of healthy food choices with portion control discussed, as well as eating regularly and within a 12 hour window most days. The need to choose "clean , green" food 50 to 75% of the time is discussed, as well as to make water the primary drink and set a goal of 64 ounces water daily.    Weight /BMI 02/08/2019 11/29/2018 08/09/2018  WEIGHT 380 lb 380 lb 380 lb  HEIGHT 5\' 10"  5\' 10"  5\' 10"   BMI 54.52 kg/m2 54.52 kg/m2 54.52 kg/m2

## 2019-02-12 NOTE — Assessment & Plan Note (Signed)
Elevated from record, need to assess in office next week DASH diet and commitment to daily physical activity for a minimum of 30 minutes discussed and encouraged, as a part of hypertension management. The importance of attaining a healthy weight is also discussed.  BP/Weight 02/08/2019 11/29/2018 08/09/2018 08/09/2018 05/06/2018 04/11/2018 123456  Systolic BP A999333 A999333 A999333 A999333 [id number] 123456 [id number]  Diastolic BP 84 84 84 84 80 78 88  Wt. (Lbs) 380 380 380 380 379 381.08 384  BMI 54.52 54.52 54.52 54.52 54.38 54.68 55.1

## 2019-02-12 NOTE — Assessment & Plan Note (Signed)
Hyperlipidemia:Low fat diet discussed and encouraged.   Lipid Panel  Lab Results  Component Value Date   CHOL 183 01/27/2018   HDL 40 (L) 01/27/2018   LDLCALC 121 (H) 01/27/2018   TRIG 108 01/27/2018   CHOLHDL 4.6 01/27/2018   Updated lab needed at/ before next visit.

## 2019-02-13 ENCOUNTER — Ambulatory Visit (INDEPENDENT_AMBULATORY_CARE_PROVIDER_SITE_OTHER): Payer: Medicare Other

## 2019-02-13 ENCOUNTER — Other Ambulatory Visit: Payer: Self-pay

## 2019-02-13 VITALS — BP 132/82 | Ht 70.0 in | Wt 391.0 lb

## 2019-02-13 DIAGNOSIS — Z794 Long term (current) use of insulin: Secondary | ICD-10-CM | POA: Diagnosis not present

## 2019-02-13 DIAGNOSIS — E1169 Type 2 diabetes mellitus with other specified complication: Secondary | ICD-10-CM

## 2019-02-13 DIAGNOSIS — E785 Hyperlipidemia, unspecified: Secondary | ICD-10-CM

## 2019-02-13 DIAGNOSIS — I10 Essential (primary) hypertension: Secondary | ICD-10-CM

## 2019-02-13 DIAGNOSIS — E669 Obesity, unspecified: Secondary | ICD-10-CM | POA: Diagnosis not present

## 2019-02-13 DIAGNOSIS — Z23 Encounter for immunization: Secondary | ICD-10-CM | POA: Diagnosis not present

## 2019-02-13 DIAGNOSIS — E1165 Type 2 diabetes mellitus with hyperglycemia: Secondary | ICD-10-CM | POA: Diagnosis not present

## 2019-02-13 DIAGNOSIS — E559 Vitamin D deficiency, unspecified: Secondary | ICD-10-CM

## 2019-02-13 NOTE — Patient Instructions (Signed)
F/u in Feb as before  Labs today IF FASTING, LIPID, CMP AND Egfr, Cbc, Tsh AND VIT d  YOU ARE REFERRED TO PODIATRY.   WILL PRESCRIBE ANTI FUNGAL TABLETS ONCE LIVER BLOOD WORK IS DONE

## 2019-02-13 NOTE — Assessment & Plan Note (Signed)
Controlled, no change in medication DASH diet and commitment to daily physical activity for a minimum of 30 minutes discussed and encouraged, as a part of hypertension management. The importance of attaining a healthy weight is also discussed.  BP/Weight 02/13/2019 02/08/2019 11/29/2018 08/09/2018 08/09/2018 05/06/2018 [id number]  Systolic BP Q000111Q A999333 A999333 A999333 A999333 [id number] [id number]  Diastolic BP 82 84 84 84 84 80 78  Wt. (Lbs) 391 380 380 380 380 379 381.08  BMI 56.1 54.52 54.52 54.52 54.52 54.38 54.68

## 2019-02-13 NOTE — Assessment & Plan Note (Signed)
[name] is reminded of the importance of commitment to daily physical activity for 30 minutes or more, as able and the need to limit carbohydrate intake to 30 to 60 grams per meal to help with blood sugar control.   The need to take medication as prescribed, test blood sugar as directed, and to call between visits if there is a concern that blood sugar is uncontrolled is also discussed.   Tracey [name] is reminded of the importance of daily foot exam, annual eye examination, and good blood sugar, blood pressure and cholesterol control.  Diabetic Labs Latest Ref Rng & Units 11/18/2018 08/04/2018 04/12/2018 03/23/2018 01/27/2018  HbA1c <5.7 % of total Hgb 10.9(H) >14.0(H) - 10.5(H) -  Microalbumin Not Estab. ug/mL - - 63.8(H) - -  Micro/Creat Ratio 0.0 - 30.0 mg/g creat - - 27.8 - -  Chol <200 mg/dL - - - - 183  HDL >50 mg/dL - - - - 40(L)  Calc LDL mg/dL (calc) - - - - 121(H)  Triglycerides <150 mg/dL - - - - 108  Creatinine 0.50 - 0.99 mg/dL 0.85 0.79 - - 0.80   BP/Weight 02/13/2019 02/08/2019 11/29/2018 08/09/2018 08/09/2018 05/06/2018 [id number]  Systolic BP Q000111Q A999333 A999333 A999333 [id number] [id number]  Diastolic BP 82 84 84 84 84 80 78  Wt. (Lbs) 391 380 380 380 380 379 381.08  BMI 56.1 54.52 54.52 54.52 54.52 54.38 54.68   Foot/eye exam completion dates Latest Ref Rng & Units 02/13/2019 08/19/2016  Eye Exam No Retinopathy - No Retinopathy  Foot Form Completion - Done -   Uncontrolled with neuropathy and long nail, need podiatry appt asap

## 2019-02-13 NOTE — Progress Notes (Signed)
Pt in for foot exam and flu vaccine , also re evaluation of blood pressure

## 2019-02-14 LAB — COMPLETE METABOLIC PANEL WITH GFR
AG Ratio: 1.2 (calc) (ref 1.0–2.5)
ALT: 37 U/L — ABNORMAL HIGH (ref 6–29)
AST: 45 U/L — ABNORMAL HIGH (ref 10–35)
Albumin: 4.1 g/dL (ref 3.6–5.1)
Alkaline phosphatase (APISO): 57 U/L (ref 37–153)
BUN: 12 mg/dL (ref 7–25)
CO2: 22 mmol/L (ref 20–32)
Calcium: 9.1 mg/dL (ref 8.6–10.4)
Chloride: 101 mmol/L (ref 98–110)
Creat: 0.82 mg/dL (ref 0.50–0.99)
GFR, Est African American: 90 mL/min/{1.73_m2} (ref >=60)
GFR, Est Non African American: 77 mL/min/{1.73_m2} (ref >=60)
Globulin: 3.3 g/dL (calc) (ref 1.9–3.7)
Glucose, Bld: 196 mg/dL — ABNORMAL HIGH (ref 65–99)
Potassium: 4.4 mmol/L (ref 3.5–5.3)
Sodium: 138 mmol/L (ref 135–146)
Total Bilirubin: 0.5 mg/dL (ref 0.2–1.2)
Total Protein: 7.4 g/dL (ref 6.1–8.1)

## 2019-02-14 LAB — LIPID PANEL
Cholesterol: 130 mg/dL (ref <200)
HDL: 38 mg/dL — ABNORMAL LOW (ref >=50)
LDL Cholesterol (Calc): 73 mg/dL (calc)
Non-HDL Cholesterol (Calc): 92 mg/dL (calc) (ref <130)
Total CHOL/HDL Ratio: 3.4 (calc) (ref <5.0)
Triglycerides: 108 mg/dL (ref <150)

## 2019-02-14 LAB — CBC
HCT: 40.2 % (ref 35.0–45.0)
Hemoglobin: 12.7 g/dL (ref 11.7–15.5)
MCH: 23.2 pg — ABNORMAL LOW (ref 27.0–33.0)
MCHC: 31.6 g/dL — ABNORMAL LOW (ref 32.0–36.0)
MCV: 73.5 fL — ABNORMAL LOW (ref 80.0–100.0)
MPV: 11.9 fL (ref 7.5–12.5)
Platelets: 275 10*3/uL (ref 140–400)
RBC: 5.47 10*6/uL — ABNORMAL HIGH (ref 3.80–5.10)
RDW: 17 % — ABNORMAL HIGH (ref 11.0–15.0)
WBC: 10.6 10*3/uL (ref 3.8–10.8)

## 2019-02-14 LAB — TSH: TSH: 0.69 mIU/L (ref 0.40–4.50)

## 2019-02-14 LAB — VITAMIN D 25 HYDROXY (VIT D DEFICIENCY, FRACTURES): Vit D, 25-Hydroxy: 25 ng/mL — ABNORMAL LOW (ref 30–100)

## 2019-02-20 ENCOUNTER — Ambulatory Visit (HOSPITAL_COMMUNITY): Payer: Medicare Other

## 2019-03-02 ENCOUNTER — Ambulatory Visit: Payer: Medicare Other | Admitting: "Endocrinology

## 2019-03-13 ENCOUNTER — Other Ambulatory Visit: Payer: Self-pay

## 2019-03-13 ENCOUNTER — Ambulatory Visit (HOSPITAL_COMMUNITY): Payer: Medicare Other

## 2019-03-15 ENCOUNTER — Telehealth: Payer: Self-pay | Admitting: *Deleted

## 2019-03-15 NOTE — Telephone Encounter (Signed)
Pt wanted to know if Dr Moshe Cipro thought it would be ok to take elderberry gummies. Would like a call back

## 2019-03-15 NOTE — Telephone Encounter (Signed)
Yes ok ,

## 2019-03-15 NOTE — Telephone Encounter (Signed)
Please advise 

## 2019-03-16 NOTE — Telephone Encounter (Signed)
Advised patient that it was ok that she takes the elderberry gummies with verbal understanding.

## 2019-03-20 ENCOUNTER — Ambulatory Visit (HOSPITAL_COMMUNITY)
Admission: RE | Admit: 2019-03-20 | Discharge: 2019-03-20 | Disposition: A | Discharge status: Home / Self Care | Payer: Medicare Other | Source: Ambulatory Visit | Attending: Family Medicine | Admitting: Family Medicine

## 2019-03-20 ENCOUNTER — Other Ambulatory Visit: Payer: Self-pay

## 2019-03-20 ENCOUNTER — Ambulatory Visit (INDEPENDENT_AMBULATORY_CARE_PROVIDER_SITE_OTHER): Payer: Medicare Other | Admitting: "Endocrinology

## 2019-03-20 ENCOUNTER — Encounter: Payer: Self-pay | Admitting: "Endocrinology

## 2019-03-20 VITALS — BP 145/89 | HR 95 | Ht 70.0 in | Wt >= 6400 oz

## 2019-03-20 DIAGNOSIS — E1165 Type 2 diabetes mellitus with hyperglycemia: Secondary | ICD-10-CM

## 2019-03-20 DIAGNOSIS — Z1231 Encounter for screening mammogram for malignant neoplasm of breast: Secondary | ICD-10-CM | POA: Diagnosis not present

## 2019-03-20 DIAGNOSIS — E782 Mixed hyperlipidemia: Secondary | ICD-10-CM | POA: Diagnosis not present

## 2019-03-20 DIAGNOSIS — I10 Essential (primary) hypertension: Secondary | ICD-10-CM | POA: Diagnosis not present

## 2019-03-20 LAB — POCT GLYCOSYLATED HEMOGLOBIN (HGB A1C): Hemoglobin A1C: 9.8 % — AB (ref 4.0–5.6)

## 2019-03-20 MED ORDER — HUMULIN R U-500 KWIKPEN 500 UNIT/ML ~~LOC~~ SOPN: 60.0000 [IU] | PEN_INJECTOR | Freq: Three times a day (TID) | SUBCUTANEOUS | 2 refills | Status: DC

## 2019-03-20 NOTE — Progress Notes (Signed)
03/20/2019, 2:01 PM                                                    Endocrinology Telehealth Visit Follow up Note -During COVID -19 Pandemic  This visit type was conducted due to national recommendations for restrictions regarding the COVID-19 Pandemic  in an effort to limit this patient's exposure and mitigate transmission of the corona virus.  Due to her co-morbid illnesses, Tracey [name] is at  moderate to high risk for complications without adequate follow up.  This format is felt to be most appropriate for her at this time.  I connected with this patient on 03/20/2019   by telephone and verified that I am speaking with the correct person using two identifiers. Tracey [name], 1956-03-18. she has verbally consented to this visit. All issues noted in this document were discussed and addressed. The format was not optimal for physical exam.    Subjective:    Patient ID: Tracey [name], female    DOB: 1955-06-18.  Tracey [name] is being engaged in telehealth in follow-up  for management of currently uncontrolled symptomatic type 2 diabetes, hyperlipidemia, hypertension. PMD:   Tracey Helper, MD.  Past Medical History:  Diagnosis Date  . Arthritis   . Diabetes mellitus without complication ([place])   . Diastolic dysfunction [id number]   Grade 1  . GERD (gastroesophageal reflux disease)   . Hyperlipidemia   . Hyperlipidemia   . Hypertension 04/15/2012  . Morbid obesity ([place])   . PE (pulmonary embolism)   . Pulmonary emboli ([place]) 1999   post TAH  . Sleep apnea    Past Surgical History:  Procedure Laterality Date  . ABDOMINAL HYSTERECTOMY  1999   , totalfor fibroids  . CESAREAN SECTION    . CHOLECYSTECTOMY  1996  . COLONOSCOPY N/A 10/31/2015   Procedure: COLONOSCOPY;  Surgeon: Rogene Houston, MD;  Location: [facility];  Service: Endoscopy;  Laterality:  N/A;  930   Social History   Socioeconomic History  . Marital status: Single    Spouse name: Not on file  . Number of children: Not on file  . Years of education: Not on file  . Highest education level: Not on file  Occupational History  . Not on file  Social Needs  . Financial resource strain: Somewhat hard  . Food insecurity    Worry: Often true    Inability: Often true  . Transportation needs    Medical: No    Non-medical: No  Tobacco Use  . Smoking status: Never Smoker  . Smokeless tobacco: Never Used  Substance and Sexual Activity  . Alcohol use: No    Alcohol/week: 0.0 standard drinks  . Drug use: No  . Sexual activity: Not Currently  Lifestyle  .  Physical activity    Days per week: 0 days    Minutes per session: 0 min  . Stress: To some extent  Relationships  . Social connections    Talks on phone: More than three times a week    Gets together: More than three times a week    Attends religious service: Never    Active member of club or organization: No    Attends meetings of clubs or organizations: Never    Relationship status: Divorced  Other Topics Concern  . Not on file  Social History Narrative  . Not on file   Outpatient Encounter Medications as of 03/20/2019  Medication Sig  . Accu-Chek Softclix Lancets lancets Use as instructed 2x daily dx E11.65  . aspirin EC 81 MG tablet Take 1 tablet (81 mg total) by mouth daily.  . clotrimazole (GYNE-LOTRIMIN 3) 2 % vaginal cream Place 1 Applicatorful vaginally at bedtime.  . ergocalciferol (VITAMIN D2) 50000 units capsule Take 1 capsule (50,000 Units total) by mouth once a week. One capsule once weekly  . glipiZIDE (GLUCOTROL XL) 10 MG 24 hr tablet TAKE ONE TABLET (10 MG TOTAL) BY MOUTH DAILY WITH BREAKFAST.  Marland Kitchen glucose blood (ACCU-CHEK AVIVA) test strip Use as instructed 2 x daily dx E11.65  . ibuprofen (ADVIL,MOTRIN) 200 MG tablet Take 200 mg by mouth every 8 (eight) hours as needed for moderate pain.   Marland Kitchen  insulin regular human CONCENTRATED (HUMULIN R U-500 KWIKPEN) 500 UNIT/ML kwikpen Inject 60 Units into the skin 3 (three) times daily with meals.  . metFORMIN (GLUCOPHAGE) 1000 MG tablet TAKE ONE TABLET (100MG  TOTAL)BY MOUTH TWO TIMES DAILY WITH A MEAL  . metoprolol tartrate (LOPRESSOR) 50 MG tablet Take 1 tablet (50 mg total) by mouth 2 (two) times daily.  Marland Kitchen omeprazole (PRILOSEC) 20 MG capsule TAKE ONE CAPSULE BY MOUTH DAILY  . rosuvastatin (CRESTOR) 20 MG tablet TAKE ONE TABLET (20MG  TOTAL) BY MOUTH DAILY  . spironolactone (ALDACTONE) 25 MG tablet TAKE ONE (1) TABLET BY MOUTH EVERY DAY  . UNABLE TO FIND Bedside commode x 1  Dx m76.9, m54.4  . UNABLE TO FIND Shower bench x 1 Shower grab bar x 1  Dx: m76.9, m54.5  . UNIFINE PENTIPS 31G X 8 MM MISC USE 1 NEEDLE DAILY  . [DISCONTINUED] Insulin Degludec (TRESIBA FLEXTOUCH) 200 UNIT/ML SOPN Inject 90 Units into the skin 2 (two) times daily at 8 am and 10 pm.   No facility-administered encounter medications on file as of 03/20/2019.     ALLERGIES: Allergies  Allergen Reactions  . Haloperidol Lactate Anaphylaxis  . Lipitor [Atorvastatin Calcium] Other (See Comments)    Markedly elevated liver enzymes    VACCINATION STATUS: Immunization History  Administered Date(s) Administered  . Influenza,inj,Quad PF,6+ Mos 04/01/2015, 03/16/2016, 01/20/2017, 04/11/2018, 02/13/2019  . Pneumococcal Polysaccharide-23 05/13/2015    Diabetes She presents for her follow-up diabetic visit. She has type 2 diabetes mellitus. Onset time: He was diagnosed at approximate age of 65 years. Her disease course has been improving. There are no hypoglycemic associated symptoms. Pertinent negatives for hypoglycemia include no confusion, headaches, pallor or seizures. Associated symptoms include polydipsia and polyuria. Pertinent negatives for diabetes include no blurred vision, no chest pain, no fatigue and no polyphagia. There are no hypoglycemic complications. Symptoms  are improving. There are no diabetic complications. Risk factors for coronary artery disease include diabetes mellitus, dyslipidemia, family history, hypertension, obesity, post-menopausal and sedentary lifestyle. Her weight is increasing steadily. She is following a generally  unhealthy diet. When asked about meal planning, she reported none. She has had a previous visit with a dietitian. She never participates in exercise. Her home blood glucose trend is increasing steadily. Her breakfast blood glucose range is generally >200 mg/dl. Her dinner blood glucose range is generally >200 mg/dl. Her bedtime blood glucose range is generally >200 mg/dl. Her overall blood glucose range is >200 mg/dl. An ACE inhibitor/angiotensin II receptor blocker is not being taken. She does not see a podiatrist.Eye exam is not current.  Hyperlipidemia This is a chronic problem. The current episode started more than 1 year ago. The problem is uncontrolled. Exacerbating diseases include diabetes and obesity. Pertinent negatives include no chest pain, myalgias or shortness of breath. Current antihyperlipidemic treatment includes statins. Risk factors for coronary artery disease include diabetes mellitus, dyslipidemia, hypertension, obesity, a sedentary lifestyle and post-menopausal.  Hypertension Pertinent negatives include no blurred vision, chest pain, headaches, palpitations or shortness of breath. Risk factors for coronary artery disease include dyslipidemia, diabetes mellitus, obesity, sedentary lifestyle and post-menopausal state. Past treatments include calcium channel blockers and beta blockers.    Constitutional: + Progressive weight gain, + fatigue, no subjective hyperthermia, no subjective hypothermia Eyes: no blurry vision, no xerophthalmia ENT: no sore throat, no nodules palpated in throat, no dysphagia/odynophagia, no hoarseness Cardiovascular: no Chest Pain, no Shortness of Breath, no palpitations, no leg  swelling Respiratory: no cough, no SOB Gastrointestinal: no Nausea/Vomiting/Diarhhea Musculoskeletal: no muscle/joint aches Skin: no rashes Neurological: no tremors, no numbness, no tingling, no dizziness Psychiatric: no depression, no anxiety    Objective:    BP (!) 145/89   Pulse 95   Ht 5\' 10"  (1.778 m)   Wt (!) 405 lb (183.7 kg)   BMI 58.11 kg/m   Wt Readings from Last 3 Encounters:  03/20/19 (!) 405 lb (183.7 kg)  02/13/19 (!) 391 lb (177.4 kg)  02/08/19 (!) 380 lb (172.4 kg)      Physical Exam- Limited  Constitutional:  Body mass index is 58.11 kg/m. , not in acute distress, normal state of mind Eyes:  EOMI, no exophthalmos Neck: Supple Respiratory: Adequate breathing efforts Musculoskeletal: no gross deformities, strength intact in all four extremities, no gross restriction of joint movements Skin:  no rashes, no hyperemia Neurological: no tremor with outstretched hands.   CMP     Component Value Date/Time   NA 138 02/13/2019 1207   K 4.4 02/13/2019 1207   CL 101 02/13/2019 1207   CO2 22 02/13/2019 1207   GLUCOSE 196 (H) 02/13/2019 1207   BUN 12 02/13/2019 1207   CREATININE 0.82 02/13/2019 1207   CALCIUM 9.1 02/13/2019 1207   PROT 7.4 02/13/2019 1207   ALBUMIN 3.9 07/13/2016 1430   AST 45 (H) 02/13/2019 1207   ALT 37 (H) 02/13/2019 1207   ALKPHOS 61 07/13/2016 1430   BILITOT 0.5 02/13/2019 1207   GFRNONAA 77 02/13/2019 1207   GFRAA 90 02/13/2019 1207     Diabetic Labs (most recent): Lab Results  Component Value Date   HGBA1C 9.8 (A) 03/20/2019   HGBA1C 10.9 (H) 11/18/2018   HGBA1C >14.0 (H) 08/04/2018     Lipid Panel ( most recent) Lipid Panel     Component Value Date/Time   CHOL 130 02/13/2019 1207   TRIG 108 02/13/2019 1207   HDL 38 (L) 02/13/2019 1207   CHOLHDL 3.4 02/13/2019 1207   VLDL 23 07/13/2016 1430   LDLCALC 73 02/13/2019 1207      Lab Results  Component Value Date  TSH 0.69 02/13/2019   TSH 1.23 05/09/2018   TSH  0.95 07/13/2016   TSH 1.26 07/10/2015   TSH 1.382 11/17/2013   TSH 0.519 04/14/2012   TSH 1.525 11/07/2009      Lipid Panel     Component Value Date/Time   CHOL 130 02/13/2019 1207   TRIG 108 02/13/2019 1207   HDL 38 (L) 02/13/2019 1207   CHOLHDL 3.4 02/13/2019 1207   VLDL 23 07/13/2016 1430   LDLCALC 73 02/13/2019 1207     Assessment & Plan:   1. Uncontrolled type 2 diabetes mellitus with hyperglycemia ([place])  - Tracey [name] has currently uncontrolled symptomatic type 2 DM since 63 years of age. -She returns with improving, however still significantly above target glycemic profile.  Her point-of-care A1c is 9.8%, overall improving from  >14%.  -her diabetes is complicated by obesity/sedentary life and Tracey [name] remains at a high risk for more acute and chronic complications which include CAD, CVA, CKD, retinopathy, and neuropathy. These are all discussed in detail with the patient.  - I have counseled her on diet management and weight loss, by adopting a carbohydrate restricted/protein rich diet.  - she  admits there is a room for improvement in her diet and drink choices. -  Suggestion is made for her to avoid simple carbohydrates  from her diet including Cakes, Sweet Desserts / Pastries, Ice Cream, Soda (diet and regular), Sweet Tea, Candies, Chips, Cookies, Sweet Pastries,  Store Bought Juices, Alcohol in Excess of  1-2 drinks a day, Artificial Sweeteners, Coffee Creamer, and "Sugar-free" Products. This will help patient to have stable blood glucose profile and potentially avoid unintended weight gain.   - I encouraged her to switch to  unprocessed or minimally processed complex starch and increased protein intake (animal or plant source), fruits, and vegetables.  - she is advised to stick to a routine mealtimes to eat 3 meals  a day and avoid unnecessary snacks ( to snack only to correct hypoglycemia).   - she is following with  Tracey [name], RDN, CDE for  individualized diabetes education.  - I have approached her with the following individualized plan to manage diabetes and patient agrees:   -She will need multiple daily injections of insulin in order for her to achieve control of diabetes to target.   -She is advised to finish her Tyler Aas current supplies using 90 units twice a day.   -After that, she will be switched to insulin U500 60 units 3 times daily AC for premeal blood glucose readings above 90 mg per DL.  She is advised to continue monitoring blood glucose 4 times a day-before meals and at bedtime.   -Patient is encouraged to call clinic for blood glucose levels less than 70 or above 200 mg /dl. -She is advised to continue metformin 1000 mg p.o. twice daily.  -She is advised to continue  glipizide  10 mg XL p.o. daily with breakfast.     2) BP/HTN:  .-Her blood pressure is not controlled to target. She is advised to continue her current medications including spironolactone, metoprolol, amlodipine.   3) Lipids/HPL: Her recent lipid panel showed improved LDL at 73 from 166.     She is advised to continue Crestor 10 mg p.o. nightly.     She will need fasting lipid panel on subsequent visits.   4) Chronic Care/Health Maintenance:  -she  is on Statin medications and  is encouraged to continue to follow up with Ophthalmology,  Dentist,  Podiatrist at least yearly or according to recommendations, and advised to   stay away from smoking. I have recommended yearly flu vaccine and pneumonia vaccination at least every 5 years; moderate intensity exercise for up to 150 minutes weekly; and  sleep for at least 7 hours a day.  - I advised patient to maintain close follow up with Tracey Helper, MD for primary care needs.  - Time spent with the patient: 25 min, of which >50% was spent in reviewing her blood glucose logs , discussing her hypoglycemia and hyperglycemia episodes, reviewing her current and  previous labs / studies and  medications  doses and developing a plan to avoid hypoglycemia and hyperglycemia. Please refer to Patient Instructions for Blood Glucose Monitoring and Insulin/Medications Dosing Guide"  in media tab for additional information. Please  also refer to " Patient Self Inventory" in the Media  tab for reviewed elements of pertinent patient history.  Tracey [name] participated in the discussions, expressed understanding, and voiced agreement with the above plans.  All questions were answered to her satisfaction. she is encouraged to contact clinic should she have any questions or concerns prior to her return visit.   Follow up plan: - Return in about 3 weeks (around 04/10/2019) for Follow up with Meter and Logs Only - no Labs.  Glade Lloyd, MD [facility] [address] [place], [place] [address] Phone: [phone]  Fax: [phone]    03/20/2019, 2:01 PM  This note was partially dictated with voice recognition software. Similar sounding words can be transcribed inadequately or may not  be corrected upon review.

## 2019-03-20 NOTE — Patient Instructions (Signed)

## 2019-03-21 ENCOUNTER — Other Ambulatory Visit (HOSPITAL_COMMUNITY): Payer: Self-pay | Admitting: Family Medicine

## 2019-03-21 DIAGNOSIS — R928 Other abnormal and inconclusive findings on diagnostic imaging of breast: Secondary | ICD-10-CM

## 2019-03-28 ENCOUNTER — Ambulatory Visit (HOSPITAL_COMMUNITY): Payer: Medicare Other

## 2019-04-11 ENCOUNTER — Encounter (HOSPITAL_COMMUNITY): Payer: Medicare Other

## 2019-04-11 ENCOUNTER — Ambulatory Visit (HOSPITAL_COMMUNITY): Payer: Medicare Other

## 2019-04-11 ENCOUNTER — Ambulatory Visit (INDEPENDENT_AMBULATORY_CARE_PROVIDER_SITE_OTHER): Payer: Medicare Other | Admitting: "Endocrinology

## 2019-04-11 ENCOUNTER — Encounter: Payer: Self-pay | Admitting: "Endocrinology

## 2019-04-11 ENCOUNTER — Encounter (HOSPITAL_COMMUNITY): Payer: Self-pay

## 2019-04-11 DIAGNOSIS — E1165 Type 2 diabetes mellitus with hyperglycemia: Secondary | ICD-10-CM

## 2019-04-11 DIAGNOSIS — E782 Mixed hyperlipidemia: Secondary | ICD-10-CM | POA: Diagnosis not present

## 2019-04-11 DIAGNOSIS — I10 Essential (primary) hypertension: Secondary | ICD-10-CM | POA: Diagnosis not present

## 2019-04-11 MED ORDER — HUMULIN R U-500 KWIKPEN 500 UNIT/ML ~~LOC~~ SOPN: 70.0000 [IU] | PEN_INJECTOR | Freq: Three times a day (TID) | SUBCUTANEOUS | 2 refills | Status: DC

## 2019-04-11 NOTE — Patient Instructions (Signed)

## 2019-04-11 NOTE — Progress Notes (Signed)
04/11/2019, 3:53 PM                                                    Endocrinology Telehealth Visit Follow up Note -During COVID -19 Pandemic  This visit type was conducted due to national recommendations for restrictions regarding the COVID-19 Pandemic  in an effort to limit this patient's exposure and mitigate transmission of the corona virus.  Due to her co-morbid illnesses, Tracey [name] is at  moderate to high risk for complications without adequate follow up.  This format is felt to be most appropriate for her at this time.  I connected with this patient on 04/11/2019   by telephone and verified that I am speaking with the correct person using two identifiers. Tracey [name], 01-10-1956. she has verbally consented to this visit. All issues noted in this document were discussed and addressed. The format was not optimal for physical exam.    Subjective:    Patient ID: Tracey [name], female    DOB: Mar 14, 1956.  Tracey [name] is being engaged in telehealth in follow-up  for management of currently uncontrolled symptomatic type 2 diabetes, hyperlipidemia, hypertension. PMD:   Fayrene Helper, MD.  Past Medical History:  Diagnosis Date  . Arthritis   . Diabetes mellitus without complication ([place])   . Diastolic dysfunction Q000111Q   Grade 1  . GERD (gastroesophageal reflux disease)   . Hyperlipidemia   . Hyperlipidemia   . Hypertension 04/15/2012  . Morbid obesity ([place])   . PE (pulmonary embolism)   . Pulmonary emboli ([place]) 1999   post TAH  . Sleep apnea    Past Surgical History:  Procedure Laterality Date  . ABDOMINAL HYSTERECTOMY  1999   , totalfor fibroids  . CESAREAN SECTION    . CHOLECYSTECTOMY  1996  . COLONOSCOPY N/A 10/31/2015   Procedure: COLONOSCOPY;  Surgeon: Rogene Houston, MD;  Location[facility];  Service: Endoscopy;   Laterality: N/A;  930   Social History   Socioeconomic History  . Marital status: Single    Spouse name: Not on file  . Number of children: Not on file  . Years of education: Not on file  . Highest education level: Not on file  Occupational History  . Not on file  Tobacco Use  . Smoking status: Never Smoker  . Smokeless tobacco: Never Used  Substance and Sexual Activity  . Alcohol use: No    Alcohol/week: 0.0 standard drinks  . Drug use: No  . Sexual activity: Not Currently  Other Topics Concern  . Not on file  Social History Narrative  . Not on file   Social Determinants of Health   Financial Resource Strain:   . Difficulty of Paying Living Expenses: Not on file  Food  Insecurity:   . Worried About Charity fundraiser in the Last Year: Not on file  . Ran Out of Food in the Last Year: Not on file  Transportation Needs:   . Lack of Transportation (Medical): Not on file  . Lack of Transportation (Non-Medical): Not on file  Physical Activity:   . Days of Exercise per Week: Not on file  . Minutes of Exercise per Session: Not on file  Stress:   . Feeling of Stress : Not on file  Social Connections:   . Frequency of Communication with Friends and Family: Not on file  . Frequency of Social Gatherings with Friends and Family: Not on file  . Attends Religious Services: Not on file  . Active Member of Clubs or Organizations: Not on file  . Attends Archivist Meetings: Not on file  . Marital Status: Not on file   Outpatient Encounter Medications as of 04/11/2019  Medication Sig  . Accu-Chek Softclix Lancets lancets Use as instructed 2x daily dx E11.65  . aspirin EC 81 MG tablet Take 1 tablet (81 mg total) by mouth daily.  . clotrimazole (GYNE-LOTRIMIN 3) 2 % vaginal cream Place 1 Applicatorful vaginally at bedtime.  . ergocalciferol (VITAMIN D2) 50000 units capsule Take 1 capsule (50,000 Units total) by mouth once a week. One capsule once weekly  . glipiZIDE  (GLUCOTROL XL) 10 MG 24 hr tablet TAKE ONE TABLET (10 MG TOTAL) BY MOUTH DAILY WITH BREAKFAST.  Marland Kitchen glucose blood (ACCU-CHEK AVIVA) test strip Use as instructed 2 x daily dx E11.65  . ibuprofen (ADVIL,MOTRIN) 200 MG tablet Take 200 mg by mouth every 8 (eight) hours as needed for moderate pain.   Marland Kitchen insulin regular human CONCENTRATED (HUMULIN R U-500 KWIKPEN) 500 UNIT/ML kwikpen Inject 70 Units into the skin 3 (three) times daily with meals.  . metFORMIN (GLUCOPHAGE) 1000 MG tablet TAKE ONE TABLET (100MG  TOTAL)BY MOUTH TWO TIMES DAILY WITH A MEAL  . metoprolol tartrate (LOPRESSOR) 50 MG tablet Take 1 tablet (50 mg total) by mouth 2 (two) times daily.  Marland Kitchen omeprazole (PRILOSEC) 20 MG capsule TAKE ONE CAPSULE BY MOUTH DAILY  . rosuvastatin (CRESTOR) 20 MG tablet TAKE ONE TABLET (20MG  TOTAL) BY MOUTH DAILY  . spironolactone (ALDACTONE) 25 MG tablet TAKE ONE (1) TABLET BY MOUTH EVERY DAY  . UNABLE TO FIND Bedside commode x 1  Dx m76.9, m54.4  . UNABLE TO FIND Shower bench x 1 Shower grab bar x 1  Dx: m76.9, m54.5  . UNIFINE PENTIPS 31G X 8 MM MISC USE 1 NEEDLE DAILY  . [DISCONTINUED] insulin regular human CONCENTRATED (HUMULIN R U-500 KWIKPEN) 500 UNIT/ML kwikpen Inject 60 Units into the skin 3 (three) times daily with meals.   No facility-administered encounter medications on file as of 04/11/2019.    ALLERGIES: Allergies  Allergen Reactions  . Haloperidol Lactate Anaphylaxis  . Lipitor [Atorvastatin Calcium] Other (See Comments)    Markedly elevated liver enzymes    VACCINATION STATUS: Immunization History  Administered Date(s) Administered  . Influenza,inj,Quad PF,6+ Mos 04/01/2015, 03/16/2016, 01/20/2017, 04/11/2018, 02/13/2019  . Pneumococcal Polysaccharide-23 05/13/2015    Diabetes She presents for her follow-up diabetic visit. She has type 2 diabetes mellitus. Onset time: He was diagnosed at approximate age of 72 years. Her disease course has been improving. There are no hypoglycemic  associated symptoms. Pertinent negatives for hypoglycemia include no confusion, headaches, pallor or seizures. Associated symptoms include polydipsia and polyuria. Pertinent negatives for diabetes include no blurred vision, no  chest pain, no fatigue and no polyphagia. There are no hypoglycemic complications. Symptoms are improving. There are no diabetic complications. Risk factors for coronary artery disease include diabetes mellitus, dyslipidemia, family history, hypertension, obesity, post-menopausal and sedentary lifestyle. Her weight is increasing steadily. She is following a generally unhealthy diet. When asked about meal planning, she reported none. She has had a previous visit with a dietitian. She never participates in exercise. Her home blood glucose trend is increasing steadily. Her breakfast blood glucose range is generally >200 mg/dl. Her lunch blood glucose range is generally >200 mg/dl. Her dinner blood glucose range is generally >200 mg/dl. Her bedtime blood glucose range is generally >200 mg/dl. Her overall blood glucose range is >200 mg/dl. (Reports improving glycemic profile, still above target.) An ACE inhibitor/angiotensin II receptor blocker is not being taken. She does not see a podiatrist.Eye exam is not current.  Hyperlipidemia This is a chronic problem. The current episode started more than 1 year ago. The problem is uncontrolled. Exacerbating diseases include diabetes and obesity. Pertinent negatives include no chest pain, myalgias or shortness of breath. Current antihyperlipidemic treatment includes statins. Risk factors for coronary artery disease include diabetes mellitus, dyslipidemia, hypertension, obesity, a sedentary lifestyle and post-menopausal.  Hypertension Pertinent negatives include no blurred vision, chest pain, headaches, palpitations or shortness of breath. Risk factors for coronary artery disease include dyslipidemia, diabetes mellitus, obesity, sedentary lifestyle and  post-menopausal state. Past treatments include calcium channel blockers and beta blockers.    Review Of systems: Limited as above.   Objective:    There were no vitals taken for this visit.  Wt Readings from Last 3 Encounters:  03/20/19 (!) 405 lb (183.7 kg)  02/13/19 (!) 391 lb (177.4 kg)  02/08/19 (!) 380 lb (172.4 kg)       CMP     Component Value Date/Time   NA 138 02/13/2019 1207   K 4.4 02/13/2019 1207   CL 101 02/13/2019 1207   CO2 22 02/13/2019 1207   GLUCOSE 196 (H) 02/13/2019 1207   BUN 12 02/13/2019 1207   CREATININE 0.82 02/13/2019 1207   CALCIUM 9.1 02/13/2019 1207   PROT 7.4 02/13/2019 1207   ALBUMIN 3.9 07/13/2016 1430   AST 45 (H) 02/13/2019 1207   ALT 37 (H) 02/13/2019 1207   ALKPHOS 61 07/13/2016 1430   BILITOT 0.5 02/13/2019 1207   GFRNONAA 77 02/13/2019 1207   GFRAA 90 02/13/2019 1207     Diabetic Labs (most recent): Lab Results  Component Value Date   HGBA1C 9.8 (A) 03/20/2019   HGBA1C 10.9 (H) 11/18/2018   HGBA1C >14.0 (H) 08/04/2018     Lipid Panel ( most recent) Lipid Panel     Component Value Date/Time   CHOL 130 02/13/2019 1207   TRIG 108 02/13/2019 1207   HDL 38 (L) 02/13/2019 1207   CHOLHDL 3.4 02/13/2019 1207   VLDL 23 07/13/2016 1430   LDLCALC 73 02/13/2019 1207      Lab Results  Component Value Date   TSH 0.69 02/13/2019   TSH 1.23 05/09/2018   TSH 0.95 07/13/2016   TSH 1.26 07/10/2015   TSH 1.382 11/17/2013   TSH 0.519 04/14/2012   TSH 1.525 11/07/2009      Lipid Panel     Component Value Date/Time   CHOL 130 02/13/2019 1207   TRIG 108 02/13/2019 1207   HDL 38 (L) 02/13/2019 1207   CHOLHDL 3.4 02/13/2019 1207   VLDL 23 07/13/2016 1430   LDLCALC 73 02/13/2019 1207  Assessment & Plan:   1. Uncontrolled type 2 diabetes mellitus with hyperglycemia ([place])  - Tracey [name] has currently uncontrolled symptomatic type 2 DM since 63 years of age. -She is now on insulin U500.  She reports improving  glycemic profile, still above target.  Her most recent point see was 9.8%, overall improving from  >14%.  -her diabetes is complicated by obesity/sedentary life and Tracey [name] remains at a high risk for more acute and chronic complications which include CAD, CVA, CKD, retinopathy, and neuropathy. These are all discussed in detail with the patient.  - I have counseled her on diet management and weight loss, by adopting a carbohydrate restricted/protein rich diet.  - she  admits there is a room for improvement in her diet and drink choices. -  Suggestion is made for her to avoid simple carbohydrates  from her diet including Cakes, Sweet Desserts / Pastries, Ice Cream, Soda (diet and regular), Sweet Tea, Candies, Chips, Cookies, Sweet Pastries,  Store Bought Juices, Alcohol in Excess of  1-2 drinks a day, Artificial Sweeteners, Coffee Creamer, and "Sugar-free" Products. This will help patient to have stable blood glucose profile and potentially avoid unintended weight gain.  - I encouraged her to switch to  unprocessed or minimally processed complex starch and increased protein intake (animal or plant source), fruits, and vegetables.  - she is advised to stick to a routine mealtimes to eat 3 meals  a day and avoid unnecessary snacks ( to snack only to correct hypoglycemia).   - she is following with  Jearld Fenton, RDN, CDE for individualized diabetes education.  - I have approached her with the following individualized plan to manage diabetes and patient agrees:   -She will need multiple daily injections of insulin in order for her to achieve control of diabetes to target.     -She is advised to increase her insulin U50 0 to 70 units 3 times a day for premeal blood glucose readings above 90 mg per DL.  She is advised to continue monitoring blood glucose 4 times a day-before meals and at bedtime.   -Patient is encouraged to call clinic for blood glucose levels less than 70 or above 200 mg  /dl. -She is advised to continue metformin 1000 mg p.o. twice daily.  -She is advised to continue  glipizide  10 mg XL p.o. daily with breakfast.     2) BP/HTN:  she is advised to home monitor blood pressure and report if > 140/90 on 2 separate readings.  She is advised to continue her current medications including spironolactone, metoprolol, amlodipine.   3) Lipids/HPL: Her recent lipid panel showed improved LDL at 73 from 166.     She is advised to continue Crestor 10 mg p.o. nightly.     She will need fasting lipid panel on subsequent visits.   4) Chronic Care/Health Maintenance:  -she  is on Statin medications and  is encouraged to continue to follow up with Ophthalmology, Dentist,  Podiatrist at least yearly or according to recommendations, and advised to   stay away from smoking. I have recommended yearly flu vaccine and pneumonia vaccination at least every 5 years; moderate intensity exercise for up to 150 minutes weekly; and  sleep for at least 7 hours a day.  - I advised patient to maintain close follow up with Fayrene Helper, MD for primary care needs.   - Patient Care Time Today:  25 min, of which >50% was spent in  counseling and the rest reviewing her  current and  previous labs/studies, previous treatments, her blood glucose readings, and medications' doses and developing a plan for long-term care based on the latest recommendations for standards of care.   Tracey [name] participated in the discussions, expressed understanding, and voiced agreement with the above plans.  All questions were answered to her satisfaction. she is encouraged to contact clinic should she have any questions or concerns prior to her return visit.  Follow up plan: - Return in about 9 weeks (around 06/13/2019) for Bring Meter and Logs- A1c in Office, Include 8 log sheets.  Glade Lloyd, MD [facility] [address] [place], [place]  [address] Phone: [phone]  Fax: [phone]    04/11/2019, 3:53 PM  This note was partially dictated with voice recognition software. Similar sounding words can be transcribed inadequately or may not  be corrected upon review.

## 2019-04-18 ENCOUNTER — Ambulatory Visit (HOSPITAL_COMMUNITY): Admission: RE | Admit: 2019-04-18 | Payer: Medicare Other | Source: Ambulatory Visit

## 2019-04-18 ENCOUNTER — Encounter (HOSPITAL_COMMUNITY): Payer: Self-pay

## 2019-04-18 ENCOUNTER — Inpatient Hospital Stay (HOSPITAL_COMMUNITY): Admission: RE | Admit: 2019-04-18 | Payer: Medicare Other | Source: Ambulatory Visit

## 2019-04-25 ENCOUNTER — Other Ambulatory Visit: Payer: Self-pay | Admitting: Family Medicine

## 2019-04-28 ENCOUNTER — Other Ambulatory Visit: Payer: Self-pay

## 2019-04-28 ENCOUNTER — Telehealth: Payer: Self-pay | Admitting: "Endocrinology

## 2019-04-28 MED ORDER — UNIFINE PENTIPS 31G X 8 MM MISC: 1.0000 | Freq: Three times a day (TID) | 3 refills | Status: DC

## 2019-04-28 NOTE — Telephone Encounter (Signed)
Tammy at [facility] called and said the patient needs a new RX for UNIFINE PENTIPS 31G X 8 MM MISC. She is using her humalin 3x a day and the script they have for the pentips says 1x a day.

## 2019-04-28 NOTE — Telephone Encounter (Signed)
Sent rx refill for pen needles to use 3x/day.

## 2019-05-02 ENCOUNTER — Ambulatory Visit (HOSPITAL_COMMUNITY)
Admission: RE | Admit: 2019-05-02 | Discharge: 2019-05-02 | Disposition: A | Discharge status: Home / Self Care | Payer: Medicare Other | Source: Ambulatory Visit | Attending: Family Medicine | Admitting: Family Medicine

## 2019-05-02 ENCOUNTER — Other Ambulatory Visit: Payer: Self-pay

## 2019-05-02 ENCOUNTER — Ambulatory Visit (HOSPITAL_COMMUNITY): Payer: Medicare Other

## 2019-05-02 DIAGNOSIS — R921 Mammographic calcification found on diagnostic imaging of breast: Secondary | ICD-10-CM | POA: Diagnosis not present

## 2019-05-02 DIAGNOSIS — R928 Other abnormal and inconclusive findings on diagnostic imaging of breast: Secondary | ICD-10-CM | POA: Diagnosis not present

## 2019-05-02 DIAGNOSIS — N6489 Other specified disorders of breast: Secondary | ICD-10-CM | POA: Diagnosis not present

## 2019-05-04 ENCOUNTER — Other Ambulatory Visit: Payer: Self-pay | Admitting: "Endocrinology

## 2019-05-08 ENCOUNTER — Telehealth: Payer: Self-pay | Admitting: "Endocrinology

## 2019-05-08 DIAGNOSIS — E1165 Type 2 diabetes mellitus with hyperglycemia: Secondary | ICD-10-CM

## 2019-05-08 MED ORDER — GLUCOSE BLOOD VI STRP: ORAL_STRIP | 5 refills | Status: DC

## 2019-05-08 NOTE — Telephone Encounter (Signed)
Pt left VM that she needs a RX sent to [facility] for her strips.

## 2019-05-08 NOTE — Telephone Encounter (Signed)
Rx refill for test strips faxed to [facility]

## 2019-05-09 MED ORDER — GLUCOSE BLOOD VI STRP: ORAL_STRIP | 5 refills | Status: DC

## 2019-05-09 NOTE — Addendum Note (Signed)
Addended by: Lavell Luster A on: 05/09/2019 11:21 AM   Modules accepted: Orders

## 2019-05-09 NOTE — Telephone Encounter (Signed)
Rx faxed to [facility].  

## 2019-05-09 NOTE — Telephone Encounter (Signed)
Jonni Sanger at [facility] called and said with her Medicare part B, this script has to go to the Medical D[facility] Side at [facility] number [phone]

## 2019-05-19 ENCOUNTER — Other Ambulatory Visit: Payer: Self-pay | Admitting: "Endocrinology

## 2019-05-21 NOTE — Progress Notes (Deleted)
Cardiology Office Note   Date:  05/21/2019   ID:  Tracey [name], Tracey [name] 1956/01/21, MRN [id number]  PCP:  Fayrene Helper, MD  Cardiologist:   [name], MD       History of Present Illness: Tracey [name] is a 64 y.o. female with a history  CP, DM. HTN, HL and morbid obesity.   Dobutamine echo in past ws normla I last saw her in 2017    [facility] was seen by D DUnn in Jan 2020   Tachycaredic   Lopressore was titrated.  Echo showed LVEF normal    She was seen by B Strader in April 2020 as a televist    I saw the pt in 2017   [facility] has been seen by D Dunn and then by B Strader     No outpatient medications have been marked as taking for the 05/22/19 encounter (Appointment) with Fay Records, MD.     Allergies:   Haloperidol lactate and Lipitor [atorvastatin calcium]   Past Medical History:  Diagnosis Date  . Arthritis   . Diabetes mellitus without complication ([place])   . Diastolic dysfunction Q000111Q   Grade 1  . GERD (gastroesophageal reflux disease)   . Hyperlipidemia   . Hyperlipidemia   . Hypertension 04/15/2012  . Morbid obesity ([place])   . PE (pulmonary embolism)   . Pulmonary emboli ([place]) 1999   post TAH  . Sleep apnea     Past Surgical History:  Procedure Laterality Date  . ABDOMINAL HYSTERECTOMY  1999   , totalfor fibroids  . CESAREAN SECTION    . CHOLECYSTECTOMY  1996  . COLONOSCOPY N/A 10/31/2015   Procedure: COLONOSCOPY;  Surgeon: Rogene Houston, MD;  Location[facility];  Service: Endoscopy;  Laterality: N/A;  930     Social History:  The patient  reports that she has never smoked. She has never used smokeless tobacco. She reports that she does not drink alcohol or use drugs.   Family History:  The patient's family history includes Alcohol abuse in her father; Aneurysm in her brother; Arthritis in her sister; Cirrhosis in her father; Coronary artery disease in her sister; Depression in her mother and son; Diabetes in her mother; Heart attack  (age of onset: 8) in her sister; Heart murmur in her son; Hyperlipidemia in her father; Hypertension in her father, mother, and son; Sleep apnea in her sister.    ROS:  Please see the history of present illness. All other systems are reviewed and  Negative to the above problem except as noted.    PHYSICAL EXAM: VS:  There were no vitals taken for this visit.  GEN: Well nourished, well developed, in no acute distress  HEENT: normal  Neck: no JVD, carotid bruits, or masses Cardiac: RRR; no murmurs, rubs, or gallops,no edema  Respiratory:  clear to auscultation bilaterally, normal work of breathing GI: soft, nontender, nondistended, + BS  No hepatomegaly  MS: no deformity Moving all extremities   Skin: warm and dry, no rash Neuro:  Strength and sensation are intact Psych: euthymic mood, full affect   EKG:  EKG is ordered today.   Lipid Panel    Component Value Date/Time   CHOL 130 02/13/2019 1207   TRIG 108 02/13/2019 1207   HDL 38 (L) 02/13/2019 1207   CHOLHDL 3.4 02/13/2019 1207   VLDL 23 07/13/2016 1430   LDLCALC 73 02/13/2019 1207      Wt Readings from Last 3  Encounters:  03/20/19 (!) 405 lb (183.7 kg)  02/13/19 (!) 391 lb (177.4 kg)  02/08/19 (!) 380 lb (172.4 kg)      ASSESSMENT AND PLAN:     Current medicines are reviewed at length with the patient today.  The patient does not have concerns regarding medicines.  Signed, [name], MD  05/21/2019 8:19 PM    [facility] [place], [place], [place]  [address] Phone: [phone]; Fax: [phone]

## 2019-05-22 ENCOUNTER — Ambulatory Visit: Payer: Medicare Other | Admitting: Internal Medicine

## 2019-05-24 NOTE — Progress Notes (Signed)
Virtual Visit via Telephone Note   This visit type was conducted due to national recommendations for restrictions regarding the COVID-19 Pandemic (e.g. social distancing) in an effort to limit this patient's exposure and mitigate transmission in our community.  Due to her co-morbid illnesses, this patient is at least at moderate risk for complications without adequate follow up.  This format is felt to be most appropriate for this patient at this time.  The patient did not have access to video technology/had technical difficulties with video requiring transitioning to audio format only (telephone).  All issues noted in this document were discussed and addressed.  No physical exam could be performed with this format.  Please refer to the patient's chart for her  consent to telehealth for [facility].   Date:  05/26/2019   ID:  Tracey [name], DOB 04-23-1955, MRN [id number]  Patient Location: Home Provider Location: Office  PCP:  Fayrene Helper, MD  Cardiologist:  [name], MD  Electrophysiologist:  None   Evaluation Performed:  Follow-Up Visit  Chief Complaint:  F/U of CP and HTN  History of Present Illness:    Tracey [name] is a 64 y.o. female with a history  CP, DM. HTN, HL and morbid obesity.   Dobutamine echo in past ws normal (2014) I last saw her in 2017    [facility] was seen by D DUnn in Jan 2020     Lopressor was titrated to lower heart rate  Echo showed LVEF normal    She was seen by B Strader in April 2020 as a televist  Pt says her chest has been feeling OK   Occasaional tightness   Will occasionally take an ASA.  Mild  Gets spells 2 to 3 x per week    Not associated with any activity Pt does says she gets SOB with activity     Glucose has been running OK Lipids in Nov 2020  LDL 73  HDL 38     The patient does not have symptoms concerning for COVID-19 infection (fever, chills, cough, or new shortness of breath).    Past Medical History:  Diagnosis Date  .  Arthritis   . Diabetes mellitus without complication ([place])   . Diastolic dysfunction Q000111Q   Grade 1  . GERD (gastroesophageal reflux disease)   . Hyperlipidemia   . Hyperlipidemia   . Hypertension 04/15/2012  . Morbid obesity ([place])   . PE (pulmonary embolism)   . Pulmonary emboli ([place]) 1999   post TAH  . Sleep apnea    Past Surgical History:  Procedure Laterality Date  . ABDOMINAL HYSTERECTOMY  1999   , totalfor fibroids  . CESAREAN SECTION    . CHOLECYSTECTOMY  1996  . COLONOSCOPY N/A 10/31/2015   Procedure: COLONOSCOPY;  Surgeon: Rogene Houston, MD;  [facility];  Service: Endoscopy;  Laterality: N/A;  930     Current Meds  Medication Sig  . Accu-Chek Softclix Lancets lancets Use as instructed 2x daily dx E11.65  . aspirin EC 81 MG tablet Take 1 tablet (81 mg total) by mouth daily.  . clotrimazole (GYNE-LOTRIMIN 3) 2 % vaginal cream Place 1 Applicatorful vaginally at bedtime.  . ergocalciferol (VITAMIN D2) 50000 units capsule Take 1 capsule (50,000 Units total) by mouth once a week. One capsule once weekly  . glipiZIDE (GLUCOTROL XL) 10 MG 24 hr tablet TAKE ONE TABLET (10 MG TOTAL) BY MOUTH DAILY WITH BREAKFAST.  Marland Kitchen glucose blood (ACCU-CHEK  AVIVA) test strip Use as instructed 4 x daily dx E11.65  . ibuprofen (ADVIL,MOTRIN) 200 MG tablet Take 200 mg by mouth every 8 (eight) hours as needed for moderate pain.   . Insulin Pen Needle (UNIFINE PENTIPS) 31G X 8 MM MISC 1 each by Other route 3 (three) times daily.  . insulin regular human CONCENTRATED (HUMULIN R U-500 KWIKPEN) 500 UNIT/ML kwikpen Inject 70 Units into the skin 3 (three) times daily with meals.  . metFORMIN (GLUCOPHAGE) 1000 MG tablet TAKE ONE TABLET (100MG  TOTAL)BY MOUTH TWO TIMES DAILY WITH A MEAL  . metoprolol tartrate (LOPRESSOR) 50 MG tablet Take 1 tablet (50 mg total) by mouth 2 (two) times daily.  Marland Kitchen omeprazole (PRILOSEC) 20 MG capsule TAKE ONE CAPSULE BY MOUTH DAILY  . rosuvastatin (CRESTOR) 20 MG  tablet TAKE ONE (1) TABLET BY MOUTH EVERY DAY  . spironolactone (ALDACTONE) 25 MG tablet TAKE ONE (1) TABLET BY MOUTH EVERY DAY     Allergies:   Haloperidol lactate and Lipitor [atorvastatin calcium]   Social History   Tobacco Use  . Smoking status: Never Smoker  . Smokeless tobacco: Never Used  Substance Use Topics  . Alcohol use: No    Alcohol/week: 0.0 standard drinks  . Drug use: No     Family Hx: The patient's family history includes Alcohol abuse in her father; Aneurysm in her brother; Arthritis in her sister; Cirrhosis in her father; Coronary artery disease in her sister; Depression in her mother and son; Diabetes in her mother; Heart attack (age of onset: 47) in her sister; Heart murmur in her son; Hyperlipidemia in her father; Hypertension in her father, mother, and son; Sleep apnea in her sister.  ROS:   Please see the history of present illness.     All other systems reviewed and are negative.   Prior CV studies:   Stress echo in 2014  Normal   Echo in 2020:  1. The left ventricle has hyperdynamic systolic function of [id number]. The  cavity size is normal. There is mildly increased left ventricular wall  thickness. Indeterminate diastolic function.  2. The right ventricle has normal systolic function. The cavity in normal  in size. There is no increase in right ventricular wall thickness. Right  ventricular systolic pressure could not be assessed.  3. The aortic valve is tricuspid. There is mild aortic annular  calcification noted.  4. The mitral valve is normal in structure.  5. The pulmonic valve is grossly normal.  6. The aortic root is normal in size and structure.   Labs/Other Tests and Data Reviewed:    EKG:  No ECG reviewed.  Recent Labs: 02/13/2019: ALT 37; BUN 12; Creat 0.82; Hemoglobin 12.7; Platelets 275; Potassium 4.4; Sodium 138; TSH 0.69   Recent Lipid Panel Lab Results  Component Value Date/Time   CHOL 130 02/13/2019 12:07 PM   TRIG 108  02/13/2019 12:07 PM   HDL 38 (L) 02/13/2019 12:07 PM   CHOLHDL 3.4 02/13/2019 12:07 PM   LDLCALC 73 02/13/2019 12:07 PM    Wt Readings from Last 3 Encounters:  05/26/19 (!) 382 lb (173.3 kg)  03/20/19 (!) 405 lb (183.7 kg)  02/13/19 (!) 391 lb (177.4 kg)     Objective:    Vital Signs:  Ht 5\' 10"  (1.778 m)   Wt (!) 382 lb (173.3 kg)   BMI 54.81 kg/m    No vital signs  ASSESSMENT & PLAN:    1. Chest pain.  Long history of CP/tightness  Stress echo normal in 2014   Pt notes no change in frequency of occurrence of severity   I do not think it is cardiac  Continue to follow  2.  HTN  Pt reports her BP has been good  Though we dont have value for today  Keep following   3  DM   Pt reports glu has been running in low 100s  4  HL   Lpids in Nov LDL was 70  Would continue to follow  5  COVID   Discussed precautions   PT should be on next tier for vaccine    Plan for f/U in Nov 2021  COVID-19 Education: The signs and symptoms of COVID-19 were discussed with the patient and how to seek care for testing (follow up with PCP or arrange E-visit). The importance of social distancing was discussed today.  Time:   Today, I have spent 25 minutes with the patient with telehealth technology discussing the above problems.     Medication Adjustments/Labs and Tests Ordered: Current medicines are reviewed at length with the patient today.  Concerns regarding medicines are outlined above.   Tests Ordered: No orders of the defined types were placed in this encounter.   Medication Changes: No orders of the defined types were placed in this encounter.   Follow Up:  In Person later this year (Nov 2021)  Signed, [name], MD  05/26/2019 9:43 AM    [facility]   Pt presents virtually for f/u CP     History of Present Illness: Tracey [name] is a 64 y.o. female with a history  CP, DM. HTN, HL and morbid obesity.   Dobutamine echo in past ws normal  (2014) I last saw her in 2017    [facility] was seen by D DUnn in Jan 2020   Tachycardia    Lopressore was titrated.  Echo showed LVEF normal    She was seen by B Strader in April 2020 as a televist  Pt says her chest has been feeling OK   Occasaional tightness   Will occasionally take an ASA.  Mild  Gets spells 2 to 3 x per week    Not associated with any activity Pt does says she gets SOB with activity     Glucose has been running OK Lipids in Nov 2020  LDL 73  HDL 38       No outpatient medications have been marked as taking for the 05/26/19 encounter (Appointment) with Fay Records, MD.     Allergies:   Haloperidol lactate and Lipitor [atorvastatin calcium]   Past Medical History:  Diagnosis Date  . Arthritis   . Diabetes mellitus without complication ([place])   . Diastolic dysfunction Q000111Q   Grade 1  . GERD (gastroesophageal reflux disease)   . Hyperlipidemia   . Hyperlipidemia   . Hypertension 04/15/2012  . Morbid obesity ([place])   . PE (pulmonary embolism)   . Pulmonary emboli ([place]) 1999   post TAH  . Sleep apnea     Past Surgical History:  Procedure Laterality Date  . ABDOMINAL HYSTERECTOMY  1999   , totalfor fibroids  . CESAREAN SECTION    . CHOLECYSTECTOMY  1996  . COLONOSCOPY N/A 10/31/2015   Procedure: COLONOSCOPY;  Surgeon: Rogene Houston, MD;  Location[facility];  Service: Endoscopy;  Laterality: N/A;  930     Social History:  The patient  reports that she has never smoked.  She has never used smokeless tobacco. She reports that she does not drink alcohol or use drugs.   Family History:  The patient's family history includes Alcohol abuse in her father; Aneurysm in her brother; Arthritis in her sister; Cirrhosis in her father; Coronary artery disease in her sister; Depression in her mother and son; Diabetes in her mother; Heart attack (age of onset: 79) in her sister; Heart murmur in her son; Hyperlipidemia in her father; Hypertension in her father, mother, and  son; Sleep apnea in her sister.    ROS:  Please see the history of present illness. All other systems are reviewed and  Negative to the above problem except as noted.    PHYSICAL EXAM: VS:  There were no vitals taken for this visit.  GEN: Well nourished, well developed, in no acute distress  HEENT: normal  Neck: no JVD, carotid bruits, or masses Cardiac: RRR; no murmurs, rubs, or gallops,no edema  Respiratory:  clear to auscultation bilaterally, normal work of breathing GI: soft, nontender, nondistended, + BS  No hepatomegaly  MS: no deformity Moving all extremities   Skin: warm and dry, no rash Neuro:  Strength and sensation are intact Psych: euthymic mood, full affect   EKG:  EKG is ordered today.   Lipid Panel    Component Value Date/Time   CHOL 130 02/13/2019 1207   TRIG 108 02/13/2019 1207   HDL 38 (L) 02/13/2019 1207   CHOLHDL 3.4 02/13/2019 1207   VLDL 23 07/13/2016 1430   LDLCALC 73 02/13/2019 1207      Wt Readings from Last 3 Encounters:  03/20/19 (!) 405 lb (183.7 kg)  02/13/19 (!) 391 lb (177.4 kg)  02/08/19 (!) 380 lb (172.4 kg)      ASSESSMENT AND PLAN:     Current medicines are reviewed at length with the patient today.  The patient does not have concerns regarding medicines.  Signed, [name], MD  05/24/2019 9:23 PM    [address], [place], [place]  [address] Phone: [phone]; Fax: [phone]

## 2019-05-26 ENCOUNTER — Telehealth: Payer: Self-pay | Admitting: Licensed Clinical Social Worker

## 2019-05-26 ENCOUNTER — Telehealth (INDEPENDENT_AMBULATORY_CARE_PROVIDER_SITE_OTHER): Payer: Medicare Other | Admitting: Internal Medicine

## 2019-05-26 ENCOUNTER — Encounter: Payer: Self-pay | Admitting: Internal Medicine

## 2019-05-26 DIAGNOSIS — I10 Essential (primary) hypertension: Secondary | ICD-10-CM | POA: Diagnosis not present

## 2019-05-26 DIAGNOSIS — R0789 Other chest pain: Secondary | ICD-10-CM

## 2019-05-26 DIAGNOSIS — E785 Hyperlipidemia, unspecified: Secondary | ICD-10-CM

## 2019-05-26 NOTE — Patient Instructions (Signed)
Medication Instructions:  Your physician recommends that you continue on your current medications as directed. Please refer to the Current Medication list given to you today.  *If you need a refill on your cardiac medications before your next appointment, please call your pharmacy*  Lab Work: none If you have labs (blood work) drawn today and your tests are completely normal, you will receive your results only by: Marland Kitchen MyChart Message (if you have MyChart) OR . A paper copy in the mail If you have any lab test that is abnormal or we need to change your treatment, we will call you to review the results.  Testing/Procedures: none  Follow-Up: At [facility], you and your health needs are our priority.  As part of our continuing mission to provide you with exceptional heart care, we have created designated Provider Care Teams.  These Care Teams include your primary Cardiologist (physician) and Advanced Practice Providers (APPs -  Physician Assistants and Nurse Practitioners) who all work together to provide you with the care you need, when you need it.  Your next appointment:   9 month(s)  The format for your next appointment:   In Person  Provider:   [name], MD  Other Instructions None      Thank you for choosing [facility] !

## 2019-05-26 NOTE — Telephone Encounter (Signed)
CSW referred to assist patient with obtaining a BP cuff. CSW contacted patient to inform cuff will be delivered to home. Patient grateful for support and assistance. CSW available as needed. Jackie Velva Molinari, LCSW, CCSW-MCS [phone]  

## 2019-05-29 ENCOUNTER — Other Ambulatory Visit: Payer: Self-pay | Admitting: Family Medicine

## 2019-05-31 ENCOUNTER — Telehealth: Payer: Self-pay | Admitting: "Endocrinology

## 2019-05-31 DIAGNOSIS — E1165 Type 2 diabetes mellitus with hyperglycemia: Secondary | ICD-10-CM

## 2019-05-31 MED ORDER — GLUCOSE BLOOD VI STRP: ORAL_STRIP | 5 refills | Status: DC

## 2019-05-31 NOTE — Addendum Note (Signed)
Addended by: Lavell Luster A on: 05/31/2019 02:21 PM   Modules accepted: Orders

## 2019-05-31 NOTE — Telephone Encounter (Signed)
Pt needs her test strips sent to [facility]. glucose blood (ACCU-CHEK AVIVA) test strip

## 2019-06-02 ENCOUNTER — Other Ambulatory Visit: Payer: Self-pay | Admitting: Family Medicine

## 2019-06-07 ENCOUNTER — Ambulatory Visit: Payer: Medicare Other | Admitting: Podiatry

## 2019-06-09 ENCOUNTER — Ambulatory Visit (INDEPENDENT_AMBULATORY_CARE_PROVIDER_SITE_OTHER): Payer: Medicare Other | Admitting: Podiatry

## 2019-06-09 ENCOUNTER — Encounter: Payer: Self-pay | Admitting: Podiatry

## 2019-06-09 ENCOUNTER — Other Ambulatory Visit: Payer: Self-pay

## 2019-06-09 VITALS — Temp 97.5°F

## 2019-06-09 DIAGNOSIS — E1142 Type 2 diabetes mellitus with diabetic polyneuropathy: Secondary | ICD-10-CM | POA: Diagnosis not present

## 2019-06-09 DIAGNOSIS — E119 Type 2 diabetes mellitus without complications: Secondary | ICD-10-CM | POA: Diagnosis not present

## 2019-06-09 DIAGNOSIS — B351 Tinea unguium: Secondary | ICD-10-CM | POA: Diagnosis not present

## 2019-06-09 DIAGNOSIS — M79674 Pain in right toe(s): Secondary | ICD-10-CM | POA: Diagnosis not present

## 2019-06-09 DIAGNOSIS — M79675 Pain in left toe(s): Secondary | ICD-10-CM

## 2019-06-09 NOTE — Patient Instructions (Signed)
Diabetes Mellitus and Foot Care Foot care is an important part of your health, especially when you have diabetes. Diabetes may cause you to have problems because of poor blood flow (circulation) to your feet and legs, which can cause your skin to:  Become thinner and drier.  Break more easily.  Heal more slowly.  Peel and crack. You may also have nerve damage (neuropathy) in your legs and feet, causing decreased feeling in them. This means that you may not notice minor injuries to your feet that could lead to more serious problems. Noticing and addressing any potential problems early is the best way to prevent future foot problems. How to care for your feet Foot hygiene  Wash your feet daily with warm water and mild soap. Do not use hot water. Then, pat your feet and the areas between your toes until they are completely dry. Do not soak your feet as this can dry your skin.  Trim your toenails straight across. Do not dig under them or around the cuticle. File the edges of your nails with an emery board or nail file.  Apply a moisturizing lotion or petroleum jelly to the skin on your feet and to dry, brittle toenails. Use lotion that does not contain alcohol and is unscented. Do not apply lotion between your toes. Shoes and socks  Wear clean socks or stockings every day. Make sure they are not too tight. Do not wear knee-high stockings since they may decrease blood flow to your legs.  Wear shoes that fit properly and have enough cushioning. Always look in your shoes before you put them on to be sure there are no objects inside.  To break in new shoes, wear them for just a few hours a day. This prevents injuries on your feet. Wounds, scrapes, corns, and calluses  Check your feet daily for blisters, cuts, bruises, sores, and redness. If you cannot see the bottom of your feet, use a mirror or ask someone for help.  Do not cut corns or calluses or try to remove them with medicine.  If you  find a minor scrape, cut, or break in the skin on your feet, keep it and the skin around it clean and dry. You may clean these areas with mild soap and water. Do not clean the area with peroxide, alcohol, or iodine.  If you have a wound, scrape, corn, or callus on your foot, look at it several times a day to make sure it is healing and not infected. Check for: ? Redness, swelling, or pain. ? Fluid or blood. ? Warmth. ? Pus or a bad smell. General instructions  Do not cross your legs. This may decrease blood flow to your feet.  Do not use heating pads or hot water bottles on your feet. They may burn your skin. If you have lost feeling in your feet or legs, you may not know this is happening until it is too late.  Protect your feet from hot and cold by wearing shoes, such as at the beach or on hot pavement.  Schedule a complete foot exam at least once a year (annually) or more often if you have foot problems. If you have foot problems, report any cuts, sores, or bruises to your health care provider immediately. Contact a health care provider if:  You have a medical condition that increases your risk of infection and you have any cuts, sores, or bruises on your feet.  You have an injury that is not   healing.  You have redness on your legs or feet.  You feel burning or tingling in your legs or feet.  You have pain or cramps in your legs and feet.  Your legs or feet are numb.  Your feet always feel cold.  You have pain around a toenail. Get help right away if:  You have a wound, scrape, corn, or callus on your foot and: ? You have pain, swelling, or redness that gets worse. ? You have fluid or blood coming from the wound, scrape, corn, or callus. ? Your wound, scrape, corn, or callus feels warm to the touch. ? You have pus or a bad smell coming from the wound, scrape, corn, or callus. ? You have a fever. ? You have a red line going up your leg. Summary  Check your feet every day  for cuts, sores, red spots, swelling, and blisters.  Moisturize feet and legs daily.  Wear shoes that fit properly and have enough cushioning.  If you have foot problems, report any cuts, sores, or bruises to your health care provider immediately.  Schedule a complete foot exam at least once a year (annually) or more often if you have foot problems. This information is not intended to replace advice given to you by your health care provider. Make sure you discuss any questions you have with your health care provider. Document Revised: 12/21/2018 Document Reviewed: 05/01/2016 Elsevier Patient Education  2020 Elsevier Inc[employer]  

## 2019-06-10 NOTE — Progress Notes (Signed)
Subjective: Tracey [name] presents today for follow up of preventative diabetic foot care, for diabetic foot evaluation and painful mycotic nails b/l that are difficult to trim. Pain interferes with ambulation. Aggravating factors include wearing enclosed shoe gear. Pain is relieved with periodic professional debridement.   She relates she has been away due to Covid-19. She has not been going out much since the pandemic started.  Allergies  Allergen Reactions  . Haloperidol Lactate Anaphylaxis  . Lipitor [Atorvastatin Calcium] Other (See Comments)    Markedly elevated liver enzymes     Objective: Vitals:   06/09/19 1024  Temp: (!) 97.5 F (36.4 C)    Vascular Examination:  Capillary refill time to digits immediate b/l, palpable DP pulses b/l, palpable PT pulses b/l, pedal hair sparse b/l and skin temperature gradient within normal limits b/l  Dermatological Examination: Pedal skin with normal turgor, texture and tone bilaterally, no open wounds bilaterally, no interdigital macerations bilaterally and toenails 1-5 b/l elongated, dystrophic, thickened, crumbly with subungual debris  Musculoskeletal: Normal muscle strength 5/5 to all lower extremity muscle groups bilaterally, no gross bony deformities bilaterally and no pain crepitus or joint limitation noted with ROM b/l  Neurological: Protective sensation decreased with 10 gram monofilament b/l  Assessment: 1. Pain due to onychomycosis of toenails of both feet   2. Diabetic peripheral neuropathy associated with type 2 diabetes mellitus ([place])   3. Encounter for diabetic foot exam ([place])    Plan: -Diabetic foot examination performed on today's visit. -Continue diabetic foot care principles. Literature dispensed on today.  -Toenails 1-5 b/l were debrided in length and girth with sterile nail nippers and dremel without iatrogenic bleeding. -Patient to continue soft, supportive shoe gear daily. -Patient to report any pedal  injuries to medical professional immediately. -Patient/POA to call should there be question/concern in the interim.  Return in about 3 months (around 09/06/2019) for diabetic nail and callus trim.

## 2019-06-13 ENCOUNTER — Other Ambulatory Visit: Payer: Self-pay

## 2019-06-13 ENCOUNTER — Ambulatory Visit (INDEPENDENT_AMBULATORY_CARE_PROVIDER_SITE_OTHER): Payer: Medicare Other | Admitting: "Endocrinology

## 2019-06-13 VITALS — BP 142/86 | HR 103 | Ht 70.0 in | Wt >= 6400 oz

## 2019-06-13 DIAGNOSIS — E1165 Type 2 diabetes mellitus with hyperglycemia: Secondary | ICD-10-CM | POA: Diagnosis not present

## 2019-06-13 DIAGNOSIS — I10 Essential (primary) hypertension: Secondary | ICD-10-CM

## 2019-06-13 DIAGNOSIS — E782 Mixed hyperlipidemia: Secondary | ICD-10-CM | POA: Diagnosis not present

## 2019-06-13 LAB — POCT GLYCOSYLATED HEMOGLOBIN (HGB A1C): Hemoglobin A1C: 7.9 % — AB (ref 4.0–5.6)

## 2019-06-13 MED ORDER — FREESTYLE LIBRE 14 DAY READER DEVI: 1.0000 | Freq: Once | 0 refills | Status: AC

## 2019-06-13 MED ORDER — FREESTYLE LIBRE 14 DAY SENSOR MISC: 1.0000 | 2 refills | Status: DC

## 2019-06-13 NOTE — Progress Notes (Signed)
06/13/2019, 8:03 PM                                                    Endocrinology Telehealth Visit Follow up Note -During COVID -19 Pandemic  This visit type was conducted due to national recommendations for restrictions regarding the COVID-19 Pandemic  in an effort to limit this patient's exposure and mitigate transmission of the corona virus.  Due to her co-morbid illnesses, Tracey [name] is at  moderate to high risk for complications without adequate follow up.  This format is felt to be most appropriate for her at this time.  I connected with this patient on 06/13/2019   by telephone and verified that I am speaking with the correct person using two identifiers. Tracey [name], 08-01-1955. she has verbally consented to this visit. All issues noted in this document were discussed and addressed. The format was not optimal for physical exam.    Subjective:    Patient ID: Tracey [name], female    DOB: 1955-07-19.  Tracey [name] is being engaged in telehealth in follow-up  for management of currently uncontrolled symptomatic type 2 diabetes, hyperlipidemia, hypertension. PMD:   Fayrene Helper, MD.  Past Medical History:  Diagnosis Date  . Arthritis   . Diabetes mellitus without complication ([place])   . Diastolic dysfunction [id number]   Grade 1  . GERD (gastroesophageal reflux disease)   . Hyperlipidemia   . Hyperlipidemia   . Hypertension 04/15/2012  . Morbid obesity ([place])   . PE (pulmonary embolism)   . Pulmonary emboli ([place]) 1999   post TAH  . Sleep apnea    Past Surgical History:  Procedure Laterality Date  . ABDOMINAL HYSTERECTOMY  1999   , totalfor fibroids  . CESAREAN SECTION    . CHOLECYSTECTOMY  1996  . COLONOSCOPY N/A 10/31/2015   Procedure: COLONOSCOPY;  Surgeon: Rogene Houston, MD;  Location: [facility];  Service: Endoscopy;  Laterality:  N/A;  930   Social History   Socioeconomic History  . Marital status: Single    Spouse name: Not on file  . Number of children: Not on file  . Years of education: Not on file  . Highest education level: Not on file  Occupational History  . Not on file  Tobacco Use  . Smoking status: Never Smoker  . Smokeless tobacco: Never Used  Substance and Sexual Activity  . Alcohol use: No    Alcohol/week: 0.0 standard drinks  . Drug use: No  . Sexual activity: Not Currently  Other Topics Concern  . Not on file  Social History Narrative  . Not on file   Social Determinants of Health   Financial Resource Strain:   . Difficulty of Paying Living Expenses: Not on file  Food  Insecurity:   . Worried About Charity fundraiser in the Last Year: Not on file  . Ran Out of Food in the Last Year: Not on file  Transportation Needs:   . Lack of Transportation (Medical): Not on file  . Lack of Transportation (Non-Medical): Not on file  Physical Activity:   . Days of Exercise per Week: Not on file  . Minutes of Exercise per Session: Not on file  Stress:   . Feeling of Stress : Not on file  Social Connections:   . Frequency of Communication with Friends and Family: Not on file  . Frequency of Social Gatherings with Friends and Family: Not on file  . Attends Religious Services: Not on file  . Active Member of Clubs or Organizations: Not on file  . Attends Archivist Meetings: Not on file  . Marital Status: Not on file   Outpatient Encounter Medications as of 06/13/2019  Medication Sig  . Accu-Chek Softclix Lancets lancets Use as instructed 2x daily dx E11.65  . aspirin EC 81 MG tablet Take 1 tablet (81 mg total) by mouth daily.  . clotrimazole (GYNE-LOTRIMIN 3) 2 % vaginal cream Place 1 Applicatorful vaginally at bedtime.  . Continuous Blood Gluc Receiver (FREESTYLE LIBRE 14 DAY READER) DEVI 1 each by Does not apply route once for 1 dose.  . Continuous Blood Gluc Sensor (FREESTYLE  LIBRE 14 DAY SENSOR) MISC Inject 1 each into the skin every 14 (fourteen) days. Use as directed.  Marland Kitchen glipiZIDE (GLUCOTROL XL) 10 MG 24 hr tablet TAKE ONE TABLET (10 MG TOTAL) BY MOUTH DAILY WITH BREAKFAST.  Marland Kitchen glucose blood (ACCU-CHEK AVIVA) test strip Use as instructed 4 x daily dx E11.65  . ibuprofen (ADVIL,MOTRIN) 200 MG tablet Take 200 mg by mouth every 8 (eight) hours as needed for moderate pain.   . Insulin Pen Needle (UNIFINE PENTIPS) 31G X 8 MM MISC 1 each by Other route 3 (three) times daily.  . insulin regular human CONCENTRATED (HUMULIN R U-500 KWIKPEN) 500 UNIT/ML kwikpen Inject 70 Units into the skin 3 (three) times daily with meals.  . metFORMIN (GLUCOPHAGE) 1000 MG tablet TAKE ONE TABLET BY MOUTH TWICE A DAY WITH A MEAL  . metoprolol tartrate (LOPRESSOR) 50 MG tablet Take 1 tablet (50 mg total) by mouth 2 (two) times daily.  Marland Kitchen omeprazole (PRILOSEC) 20 MG capsule TAKE ONE CAPSULE BY MOUTH DAILY  . rosuvastatin (CRESTOR) 20 MG tablet TAKE ONE (1) TABLET BY MOUTH EVERY DAY  . spironolactone (ALDACTONE) 25 MG tablet TAKE ONE (1) TABLET BY MOUTH EVERY DAY  . UNABLE TO FIND Bedside commode x 1  Dx m76.9, m54.4  . UNABLE TO FIND Shower bench x 1 Shower grab bar x 1  Dx: m76.9, m54.5  . [DISCONTINUED] ergocalciferol (VITAMIN D2) 50000 units capsule Take 1 capsule (50,000 Units total) by mouth once a week. One capsule once weekly   No facility-administered encounter medications on file as of 06/13/2019.    ALLERGIES: Allergies  Allergen Reactions  . Haloperidol Lactate Anaphylaxis  . Lipitor [Atorvastatin Calcium] Other (See Comments)    Markedly elevated liver enzymes    VACCINATION STATUS: Immunization History  Administered Date(s) Administered  . Influenza,inj,Quad PF,6+ Mos 04/01/2015, 03/16/2016, 01/20/2017, 04/11/2018, 02/13/2019  . Pneumococcal Polysaccharide-23 05/13/2015    Diabetes She presents for her follow-up diabetic visit. She has type 2 diabetes mellitus. Onset  time: He was diagnosed at approximate age of 57 years. Her disease course has been improving. There are  no hypoglycemic associated symptoms. Pertinent negatives for hypoglycemia include no confusion, headaches, pallor or seizures. Associated symptoms include polydipsia and polyuria. Pertinent negatives for diabetes include no blurred vision, no chest pain, no fatigue and no polyphagia. There are no hypoglycemic complications. Symptoms are improving. There are no diabetic complications. Risk factors for coronary artery disease include diabetes mellitus, dyslipidemia, family history, hypertension, obesity, post-menopausal and sedentary lifestyle. Her weight is increasing steadily. She is following a generally unhealthy diet. When asked about meal planning, she reported none. She has had a previous visit with a dietitian. She never participates in exercise. Her home blood glucose trend is increasing steadily. Her breakfast blood glucose range is generally 140-180 mg/dl. Her lunch blood glucose range is generally 140-180 mg/dl. Her dinner blood glucose range is generally 140-180 mg/dl. Her bedtime blood glucose range is generally 140-180 mg/dl. Her overall blood glucose range is 140-180 mg/dl. (She presents with controlled glycemia to target levels and A1c of 7.9% significantly improved from greater than 14%.  ) An ACE inhibitor/angiotensin II receptor blocker is not being taken. She does not see a podiatrist.Eye exam is not current.  Hyperlipidemia This is a chronic problem. The current episode started more than 1 year ago. The problem is uncontrolled. Exacerbating diseases include diabetes and obesity. Pertinent negatives include no chest pain, myalgias or shortness of breath. Current antihyperlipidemic treatment includes statins. Risk factors for coronary artery disease include diabetes mellitus, dyslipidemia, hypertension, obesity, a sedentary lifestyle and post-menopausal.  Hypertension Pertinent negatives  include no blurred vision, chest pain, headaches, palpitations or shortness of breath. Risk factors for coronary artery disease include dyslipidemia, diabetes mellitus, obesity, sedentary lifestyle and post-menopausal state. Past treatments include calcium channel blockers and beta blockers.   Review of systems  Constitutional: + Minimally fluctuating body weight,  current  Body mass index is 58.54 kg/m. , no fatigue, no subjective hyperthermia, no subjective hypothermia Eyes: no blurry vision, no xerophthalmia ENT: no sore throat, no nodules palpated in throat, no dysphagia/odynophagia, no hoarseness Cardiovascular: no Chest Pain, no Shortness of Breath, no palpitations, no leg swelling Respiratory: no cough, no shortness of breath Gastrointestinal: no Nausea/Vomiting/Diarhhea Musculoskeletal: no muscle/joint aches Skin: no rashes, no hyperemia Neurological: no tremors, no numbness, no tingling, no dizziness Psychiatric: no depression, no anxiety    Objective:    BP (!) 142/86   Pulse (!) 103   Ht 5\' 10"  (1.778 m)   Wt (!) 408 lb (185.1 kg)   BMI 58.54 kg/m   Wt Readings from Last 3 Encounters:  06/13/19 (!) 408 lb (185.1 kg)  05/26/19 (!) 382 lb (173.3 kg)  03/20/19 (!) 405 lb (183.7 kg)      Physical Exam- Limited  Constitutional:  Body mass index is 58.54 kg/m. , not in acute distress, normal state of mind Eyes:  EOMI, no exophthalmos Neck: Supple Thyroid: No gross goiter Respiratory: Adequate breathing efforts Musculoskeletal: no gross deformities, strength intact in all four extremities, no gross restriction of joint movements Skin:  no rashes, no hyperemia Neurological: no tremor with outstretched hands,    CMP     Component Value Date/Time   NA 138 02/13/2019 1207   K 4.4 02/13/2019 1207   CL 101 02/13/2019 1207   CO2 22 02/13/2019 1207   GLUCOSE 196 (H) 02/13/2019 1207   BUN 12 02/13/2019 1207   CREATININE 0.82 02/13/2019 1207   CALCIUM 9.1 02/13/2019  1207   PROT 7.4 02/13/2019 1207   ALBUMIN 3.9 07/13/2016 1430   AST 45 (  H) 02/13/2019 1207   ALT 37 (H) 02/13/2019 1207   ALKPHOS 61 07/13/2016 1430   BILITOT 0.5 02/13/2019 1207   GFRNONAA 77 02/13/2019 1207   GFRAA 90 02/13/2019 1207    Diabetic Labs (most recent): Lab Results  Component Value Date   HGBA1C 7.9 (A) 06/13/2019   HGBA1C 9.8 (A) 03/20/2019   HGBA1C 10.9 (H) 11/18/2018    Lipid Panel     Component Value Date/Time   CHOL 130 02/13/2019 1207   TRIG 108 02/13/2019 1207   HDL 38 (L) 02/13/2019 1207   CHOLHDL 3.4 02/13/2019 1207   VLDL 23 07/13/2016 1430   LDLCALC 73 02/13/2019 1207      Lab Results  Component Value Date   TSH 0.69 02/13/2019   TSH 1.23 05/09/2018   TSH 0.95 07/13/2016   TSH 1.26 07/10/2015   TSH 1.382 11/17/2013   TSH 0.519 04/14/2012   TSH 1.525 11/07/2009      Lipid Panel     Component Value Date/Time   CHOL 130 02/13/2019 1207   TRIG 108 02/13/2019 1207   HDL 38 (L) 02/13/2019 1207   CHOLHDL 3.4 02/13/2019 1207   VLDL 23 07/13/2016 1430   LDLCALC 73 02/13/2019 1207     Assessment & Plan:   1. Uncontrolled type 2 diabetes mellitus with hyperglycemia ([place])  - Adaline D Gustus has currently uncontrolled symptomatic type 2 DM since 64 years of age. -She is now on insulin U500.   She presents with controlled glycemia to target levels and A1c of 7.9% significantly improved from greater than 14%.    -her diabetes is complicated by obesity/sedentary life and LARENDA FEAST remains at a high risk for more acute and chronic complications which include CAD, CVA, CKD, retinopathy, and neuropathy. These are all discussed in detail with the patient.  - I have counseled her on diet management and weight loss, by adopting a carbohydrate restricted/protein rich diet.  -- she  admits there is a room for improvement in her diet and drink choices. -  Suggestion is made for her to avoid simple carbohydrates  from her diet including  Cakes, Sweet Desserts / Pastries, Ice Cream, Soda (diet and regular), Sweet Tea, Candies, Chips, Cookies, Sweet Pastries,  Store Bought Juices, Alcohol in Excess of  1-2 drinks a day, Artificial Sweeteners, Coffee Creamer, and "Sugar-free" Products. This will help patient to have stable blood glucose profile and potentially avoid unintended weight gain.  - I encouraged her to switch to  unprocessed or minimally processed complex starch and increased protein intake (animal or plant source), fruits, and vegetables.  - she is advised to stick to a routine mealtimes to eat 3 meals  a day and avoid unnecessary snacks ( to snack only to correct hypoglycemia).   - she is following with  Jearld Fenton, RDN, CDE for individualized diabetes education.  - I have approached her with the following individualized plan to manage diabetes and patient agrees:   -She will continue to need multiple daily injections of insulin in order for her to achieve control of diabetes to target.     -She is presenting with near target glycemic profile.  She is advised to continue her current insulin Humulin RU500  70 units 3 times a day for premeal blood glucose readings above 90 mg per DL.  She is advised to continue monitoring blood glucose 4 times a day-before meals and at bedtime.   She has received her CGM device, however did not  start using it.  She is advised to check nurse visit for demonstration of her freestyle libre CGM utility. -Patient is encouraged to call clinic for blood glucose levels less than 70 or above 200 mg /dl. -She is advised to continue metformin 1000 mg p.o. twice daily.  -She is advised to continue  glipizide  10 mg XL p.o. daily with breakfast.     2) BP/HTN:  -Her blood pressure is not controlled to target.  She is advised to continue her current medications including spironolactone, metoprolol, amlodipine.   3) Lipids/HPL: Her recent lipid panel showed improved LDL at 73 from 166.     She  is advised to continue Crestor 10 mg p.o. nightly.    She will need fasting lipid panel on subsequent visits.   4) Chronic Care/Health Maintenance:  -she  is on Statin medications and  is encouraged to continue to follow up with Ophthalmology, Dentist,  Podiatrist at least yearly or according to recommendations, and advised to   stay away from smoking. I have recommended yearly flu vaccine and pneumonia vaccination at least every 5 years; moderate intensity exercise for up to 150 minutes weekly; and  sleep for at least 7 hours a day.  - I advised patient to maintain close follow up with Fayrene Helper, MD for primary care needs.  - Time spent on this patient care encounter:  35 min, of which > 50% was spent in  counseling and the rest reviewing her blood glucose logs , discussing her hypoglycemia and hyperglycemia episodes, reviewing her current and  previous labs / studies  ( including abstraction from other facilities) and medications  doses and developing a  long term treatment plan and documenting her care.   Please refer to Patient Instructions for Blood Glucose Monitoring and Insulin/Medications Dosing Guide"  in media tab for additional information. Please  also refer to " Patient Self Inventory" in the Media  tab for reviewed elements of pertinent patient history.  Tracey [name] participated in the discussions, expressed understanding, and voiced agreement with the above plans.  All questions were answered to her satisfaction. she is encouraged to contact clinic should she have any questions or concerns prior to her return visit.   Follow up plan: - Return in about 3 months (around 09/13/2019) for Bring Meter and Logs- A1c in Office, Follow up with Pre-visit Labs.  Glade Lloyd, MD [facility] [address] [place], [place] [address] Phone: [phone]  Fax: [phone]    06/13/2019, 8:03 PM  This note was partially  dictated with voice recognition software. Similar sounding words can be transcribed inadequately or may not  be corrected upon review.

## 2019-06-13 NOTE — Patient Instructions (Signed)

## 2019-06-14 ENCOUNTER — Ambulatory Visit: Payer: Medicare Other

## 2019-07-19 ENCOUNTER — Other Ambulatory Visit: Payer: Self-pay | Admitting: "Endocrinology

## 2019-08-14 ENCOUNTER — Other Ambulatory Visit: Payer: Self-pay | Admitting: Family Medicine

## 2019-08-14 ENCOUNTER — Other Ambulatory Visit: Payer: Self-pay | Admitting: "Endocrinology

## 2019-08-25 ENCOUNTER — Other Ambulatory Visit: Payer: Self-pay | Admitting: "Endocrinology

## 2019-09-04 ENCOUNTER — Other Ambulatory Visit: Payer: Self-pay | Admitting: "Endocrinology

## 2019-09-08 ENCOUNTER — Ambulatory Visit: Payer: Medicare Other | Admitting: Podiatry

## 2019-09-12 ENCOUNTER — Other Ambulatory Visit: Payer: Self-pay | Admitting: Internal Medicine

## 2019-09-14 ENCOUNTER — Ambulatory Visit: Payer: Medicare Other | Admitting: "Endocrinology

## 2019-10-12 ENCOUNTER — Other Ambulatory Visit: Payer: Self-pay | Admitting: "Endocrinology

## 2019-10-24 ENCOUNTER — Other Ambulatory Visit: Payer: Self-pay | Admitting: Family Medicine

## 2019-11-13 DIAGNOSIS — Z23 Encounter for immunization: Secondary | ICD-10-CM | POA: Diagnosis not present

## 2019-11-14 ENCOUNTER — Ambulatory Visit: Payer: Medicare Other | Admitting: Podiatry

## 2019-11-21 ENCOUNTER — Other Ambulatory Visit: Payer: Self-pay | Admitting: "Endocrinology

## 2019-11-30 ENCOUNTER — Ambulatory Visit (INDEPENDENT_AMBULATORY_CARE_PROVIDER_SITE_OTHER): Payer: Medicare Other | Admitting: Family Medicine

## 2019-11-30 ENCOUNTER — Other Ambulatory Visit: Payer: Self-pay

## 2019-11-30 ENCOUNTER — Encounter: Payer: Self-pay | Admitting: Family Medicine

## 2019-11-30 VITALS — Ht 70.0 in | Wt 381.0 lb

## 2019-11-30 DIAGNOSIS — Z0000 Encounter for general adult medical examination without abnormal findings: Secondary | ICD-10-CM

## 2019-11-30 NOTE — Progress Notes (Signed)
Subjective:   Tracey [name] is a 64 y.o. female who presents for Medicare Annual (Subsequent) preventive examination.  Method of visit: Telephone  Location of Patient: Home Location of Provider: Office Consent was obtain for visit to be over via a telehealth platform. Services rendered by provider: Visit was performed via telephone/audio only  I verified that I am speaking with the correct person using two identifiers.  Review of Systems    Cardiac Risk Factors include: obesity (BMI >30kg/m2);dyslipidemia;diabetes mellitus     Objective:    Today's Vitals   11/30/19 0911 11/30/19 0912  Weight: (!) 381 lb (172.8 kg)   Height: 5\' 10"  (1.778 m)   PainSc: 7  7    Body mass index is 54.67 kg/m.  Advanced Directives 11/22/2017 11/16/2016 10/22/2016 05/12/2016 02/10/2016 10/31/2015 05/09/2015  Does Patient Have a Medical Advance Directive? No No No No No No No  Would patient like information on creating a medical advance directive? Yes (ED - Information included in AVS) Yes (MAU/Ambulatory/Procedural Areas - Information given) - - - No - patient declined information No - patient declined information    Current Medications (verified) Outpatient Encounter Medications as of 11/30/2019  Medication Sig   Accu-Chek Softclix Lancets lancets Use as instructed 2x daily dx E11.65   aspirin EC 81 MG tablet Take 1 tablet (81 mg total) by mouth daily.   clotrimazole (GYNE-LOTRIMIN 3) 2 % vaginal cream Place 1 Applicatorful vaginally at bedtime.   Continuous Blood Gluc Sensor (FREESTYLE LIBRE 14 DAY SENSOR) MISC Inject 1 each into the skin every 14 (fourteen) days. Use as directed.   glipiZIDE (GLUCOTROL XL) 10 MG 24 hr tablet TAKE ONE TABLET (10 MG TOTAL) BY MOUTH DAILY WITH BREAKFAST.   glucose blood (ACCU-CHEK AVIVA) test strip Use as instructed 4 x daily dx E11.65   HUMULIN R U-500 KWIKPEN 500 UNIT/ML kwikpen INJECT 70 UNITS INTO THE SKIN 3 TIMES DAILY WITH MEALS.   ibuprofen  (ADVIL,MOTRIN) 200 MG tablet Take 200 mg by mouth every 8 (eight) hours as needed for moderate pain.    Insulin Pen Needle (UNIFINE PENTIPS) 31G X 8 MM MISC 1 each by Other route 3 (three) times daily.   metFORMIN (GLUCOPHAGE) 1000 MG tablet TAKE ONE TABLET BY MOUTH TWICE A DAY WITH A MEAL   metoprolol tartrate (LOPRESSOR) 50 MG tablet TAKE ONE TABLET (50MG  TOTAL) BY MOUTH TWO TIMES DAILY   omeprazole (PRILOSEC) 20 MG capsule TAKE ONE CAPSULE BY MOUTH DAILY   rosuvastatin (CRESTOR) 20 MG tablet TAKE ONE (1) TABLET BY MOUTH EVERY DAY   spironolactone (ALDACTONE) 25 MG tablet TAKE ONE (1) TABLET BY MOUTH EVERY DAY   UNABLE TO FIND Bedside commode x 1  Dx m76.9, m54.4   UNABLE TO FIND Shower bench x 1 Shower grab bar x 1  Dx: m76.9, m54.5   No facility-administered encounter medications on file as of 11/30/2019.    Allergies (verified) Haloperidol lactate and Lipitor [atorvastatin calcium]   History: Past Medical History:  Diagnosis Date   Arthritis    Diabetes mellitus without complication (HCC)    Diastolic dysfunction 0/0938   Grade 1   GERD (gastroesophageal reflux disease)    Hyperlipidemia    Hyperlipidemia    Hypertension 04/15/2012   Morbid obesity ([place])    PE (pulmonary embolism)    Pulmonary emboli ([place]) 1999   post TAH   Sleep apnea    Past Surgical History:  Procedure Laterality Date   ABDOMINAL HYSTERECTOMY  1999   ,  totalfor fibroids   CESAREAN SECTION     CHOLECYSTECTOMY  1996   COLONOSCOPY N/A 10/31/2015   Procedure: COLONOSCOPY;  Surgeon: Rogene Houston, MD;  Location: [facility];  Service: Endoscopy;  Laterality: N/A;  56   Family History  Problem Relation Age of Onset   Diabetes Mother    Hypertension Mother    Depression Mother    Hyperlipidemia Father    Hypertension Father    Alcohol abuse Father    Cirrhosis Father    Aneurysm Brother    Sleep apnea Sister    Arthritis Sister    Coronary artery disease  Sister    Heart attack Sister 78   Heart murmur Son    Hypertension Son    Depression Son    Social History   Socioeconomic History   Marital status: Single    Spouse name: Not on file   Number of children: Not on file   Years of education: Not on file   Highest education level: Not on file  Occupational History   Not on file  Tobacco Use   Smoking status: Never Smoker   Smokeless tobacco: Never Used  Vaping Use   Vaping Use: Never used  Substance and Sexual Activity   Alcohol use: No    Alcohol/week: 0.0 standard drinks   Drug use: No   Sexual activity: Not Currently  Other Topics Concern   Not on file  Social History Narrative   Not on file   Social Determinants of Health   Financial Resource Strain:    Difficulty of Paying Living Expenses: Not on file  Food Insecurity:    Worried About Charity fundraiser in the Last Year: Not on file   [employer] of Food in the Last Year: Not on file  Transportation Needs:    Lack of Transportation (Medical): Not on file   Lack of Transportation (Non-Medical): Not on file  Physical Activity:    Days of Exercise per Week: Not on file   Minutes of Exercise per Session: Not on file  Stress:    Feeling of Stress : Not on file  Social Connections:    Frequency of Communication with Friends and Family: Not on file   Frequency of Social Gatherings with Friends and Family: Not on file   Attends Religious Services: Not on file   Active Member of Clubs or Organizations: Not on file   Attends Archivist Meetings: Not on file   Marital Status: Not on file    Tobacco Counseling Counseling given: Yes   Clinical Intake:  Pre-visit preparation completed: Yes  Pain : 0-10 Pain Score: 7  Pain Type: Chronic pain Pain Location: Back Pain Orientation: Lower Pain Radiating Towards: legs Pain Descriptors / Indicators: Aching, Sharp Pain Onset: More than a month ago Pain Frequency:  Constant Pain Relieving Factors: ibuprofen Effect of Pain on Daily Activities: yes  Pain Relieving Factors: ibuprofen  BMI - recorded: 54.67 Nutritional Status: BMI > 30  Obese Nutritional Risks: None Diabetes: Yes CBG done?: No Did pt. bring in CBG monitor from home?: No  How often do you need to have someone help you when you read instructions, pamphlets, or other written materials from your doctor or pharmacy?: 1 - Never What is the last grade level you completed in school?: 12  Diabetic? yes         Activities of Daily Living In your present state of health, do you have any difficulty performing  the following activities: 11/30/2019  Hearing? N  Vision? N  Difficulty concentrating or making decisions? N  Walking or climbing stairs? Y  Dressing or bathing? N  Doing errands, shopping? N  Preparing Food and eating ? N  Using the Toilet? N  In the past six months, have you accidently leaked urine? N  Do you have problems with loss of bowel control? N  Managing your Medications? N  Managing your Finances? N  Housekeeping or managing your Housekeeping? Y  Some recent data might be hidden    Patient Care Team: Fayrene Helper, MD as PCP - General (Family Medicine) Fay Records, MD as PCP - Cardiology (Cardiology) Fay Records, MD as Consulting Physician (Cardiology) Phillips Odor, MD as Consulting Physician (Neurology) Rutherford Guys, MD as Consulting Physician (Ophthalmology)  Indicate any recent Medical Services you may have received from other than [facility] providers in the past year (date may be approximate).     Assessment:   This is a routine wellness examination for Raima.  Hearing/Vision screen No exam data present  Dietary issues and exercise activities discussed: Current Exercise Habits: Home exercise routine, Type of exercise: calisthenics, Time (Minutes): 30, Frequency (Times/Week): 2, Weekly Exercise (Minutes/Week): 60, Intensity: Mild, Exercise  limited by: orthopedic condition(s)  Goals     Exercise 3x per week (30 min per time)     Recommend starting a routine exercise program at least 3 days a week for 30-45 minutes at a time as tolerated.        Depression Screen PHQ 2/9 Scores 11/30/2019 11/29/2018 04/11/2018 03/21/2018 12/14/2017 11/22/2017 08/30/2017  PHQ - 2 Score 0 0 4 2 0 2 2  PHQ- 9 Score - - 9 11 - 10 3    Fall Risk Fall Risk  11/30/2019 03/20/2019 02/08/2019 11/29/2018 08/09/2018  Falls in the past year? 1 0 1 1 1   Number falls in past yr: 1 - 1 0 1  Comment - - - - -  Injury with Fall? 1 - 0 1 0  Comment - - - - -  Risk for fall due to : Impaired balance/gait;History of fall(s) - - - -  Follow up Falls evaluation completed;Education provided;Falls prevention discussed Falls evaluation completed - - -    Any stairs in or around the home? No  If so, are there any without handrails? No  Home free of loose throw rugs in walkways, pet beds, electrical cords, etc? No  Adequate lighting in your home to reduce risk of falls? Yes   ASSISTIVE DEVICES UTILIZED TO PREVENT FALLS:  Life alert? No  Use of a cane, walker or w/c? Yes  Grab bars in the bathroom? No  Shower chair or bench in shower? No  Elevated toilet seat or a handicapped toilet? No   TIMED UP AND GO:  Was the test performed? No .   Cognitive Function:     6CIT Screen 11/30/2019 11/29/2018 11/22/2017 11/16/2016  What Year? 0 points 0 points 0 points 0 points  What month? 0 points 0 points 0 points 0 points  What time? 0 points 0 points 0 points 0 points  Count back from 20 0 points 0 points - 0 points  Months in reverse 0 points 0 points - 0 points  Repeat phrase 0 points 0 points - 0 points  Total Score 0 0 - 0    Immunizations Immunization History  Administered Date(s) Administered   Influenza,inj,Quad PF,6+ Mos 04/01/2015, 03/16/2016, 01/20/2017, 04/11/2018, 02/13/2019  Pneumococcal Polysaccharide-23 05/13/2015    TDAP status: Up to  date Flu Vaccine status: Up to date Pneumococcal vaccine status: Up to date Covid-19 vaccine status: Completed vaccines  Qualifies for Shingles Vaccine? Yes   Zostavax completed No   Shingrix Completed?: No.    Education has been provided regarding the importance of this vaccine. Patient has been advised to call insurance company to determine out of pocket expense if they have not yet received this vaccine. Advised may also receive vaccine at local pharmacy or Health Dept. Verbalized acceptance and understanding.  Screening Tests Health Maintenance  Topic Date Due   COVID-19 Vaccine (1) Never done   OPHTHALMOLOGY EXAM  10/16/2018   URINE MICROALBUMIN  04/13/2019   PAP SMEAR-Modifier  07/16/2019   INFLUENZA VACCINE  11/12/2019   TETANUS/TDAP  02/13/2020 (Originally [birth year]1976)   HEMOGLOBIN A1C  12/14/2019   FOOT EXAM  06/08/2020   MAMMOGRAM  05/01/2021   COLONOSCOPY  10/30/2025   PNEUMOCOCCAL POLYSACCHARIDE VACCINE AGE 64-64 HIGH RISK  Completed   Hepatitis C Screening  Completed   HIV Screening  Completed    Health Maintenance  Health Maintenance Due  Topic Date Due   COVID-19 Vaccine (1) Never done   OPHTHALMOLOGY EXAM  10/16/2018   URINE MICROALBUMIN  04/13/2019   PAP SMEAR-Modifier  07/16/2019   INFLUENZA VACCINE  11/12/2019    Colorectal cancer screening: Completed 10/31/2015. Repeat every 10 years Mammogram status: Completed 03/21/2019. Repeat every year   Lung Cancer Screening: (Low Dose CT Chest recommended if Age 78-80 years, 30 pack-year currently smoking OR have quit w/in 15years.) does not qualify.   Additional Screening:  Hepatitis C Screening: does not qualify; Completed 05/04/2017  Vision Screening: Recommended annual ophthalmology exams for early detection of glaucoma and other disorders of the eye. Is the patient up to date with their annual eye exam?  No  Who is the provider or what is the name of the office in which the patient  attends annual eye exams? Dr.Shapiro If pt is not established with a provider, would they like to be referred to a provider to establish care? No .   Dental Screening: Recommended annual dental exams for proper oral hygiene  Community Resource Referral / Chronic Care Management: CRR required this visit?  No   CCM required this visit?  No     Plan:     1. Encounter for Medicare annual wellness exam  I have personally reviewed and noted the following in the patients chart:    Medical and social history  Use of alcohol, tobacco or illicit drugs   Current medications and supplements  Functional ability and status  Nutritional status  Physical activity  Advanced directives  List of other physicians  Hospitalizations, surgeries, and ER visits in previous 12 months  Vitals  Screenings to include cognitive, depression, and falls  Referrals and appointments  In addition, I have reviewed and discussed with patient certain preventive protocols, quality metrics, and best practice recommendations. A written personalized care plan for preventive services as well as general preventive health recommendations were provided to patient.     Perlie Mayo, NP   11/30/2019    I provided 20 minutes of non-face-to-face time during this encounter.

## 2019-11-30 NOTE — Patient Instructions (Signed)
Tracey [name] , Thank you for taking time to come for your Medicare Wellness Visit. I appreciate your ongoing commitment to your health goals. Please review the following plan we discussed and let me know if I can assist you in the future.   Please continue to practice social distancing to keep you, your family, and our community safe.  If you must go out, please wear a Mask and practice good handwashing.  Screening recommendations/referrals: Colonoscopy: Due in 2027 Mammogram: Due in Dec Bone Density: Due at 54 Recommended yearly ophthalmology/optometry visit for glaucoma screening and checkup Recommended yearly dental visit for hygiene and checkup  Vaccinations: up to date  Advanced directives: Mail a copy with this  Conditions/risks identified: Falls  Next appointment: 2 months with Dr Moshe Cipro need, 1 year for annual   Preventive Care 40-64 Years, Female Preventive care refers to lifestyle choices and visits with your health care provider that can promote health and wellness. What does preventive care include?  A yearly physical exam. This is also called an annual well check.  Dental exams once or twice a year.  Routine eye exams. Ask your health care provider how often you should have your eyes checked.  Personal lifestyle choices, including:  Daily care of your teeth and gums.  Regular physical activity.  Eating a healthy diet.  Avoiding tobacco and drug use.  Limiting alcohol use.  Practicing safe sex.  Taking low-dose aspirin daily starting at age 62.  Taking vitamin and mineral supplements as recommended by your health care provider. What happens during an annual well check? The services and screenings done by your health care provider during your annual well check will depend on your age, overall health, lifestyle risk factors, and family history of disease. Counseling  Your health care provider may ask you questions about your:  Alcohol use.  Tobacco  use.  Drug use.  Emotional well-being.  Home and relationship well-being.  Sexual activity.  Eating habits.  Work and work Statistician.  Method of birth control.  Menstrual cycle.  Pregnancy history. Screening  You may have the following tests or measurements:  Height, weight, and BMI.  Blood pressure.  Lipid and cholesterol levels. These may be checked every 5 years, or more frequently if you are over 62 years old.  Skin check.  Lung cancer screening. You may have this screening every year starting at age 15 if you have a 30-pack-year history of smoking and currently smoke or have quit within the past 15 years.  Fecal occult blood test (FOBT) of the stool. You may have this test every year starting at age 46.  Flexible sigmoidoscopy or colonoscopy. You may have a sigmoidoscopy every 5 years or a colonoscopy every 10 years starting at age 20.  Hepatitis C blood test.  Hepatitis B blood test.  Sexually transmitted disease (STD) testing.  Diabetes screening. This is done by checking your blood sugar (glucose) after you have not eaten for a while (fasting). You may have this done every 1-3 years.  Mammogram. This may be done every 1-2 years. Talk to your health care provider about when you should start having regular mammograms. This may depend on whether you have a family history of breast cancer.  BRCA-related cancer screening. This may be done if you have a family history of breast, ovarian, tubal, or peritoneal cancers.  Pelvic exam and Pap test. This may be done every 3 years starting at age 56. Starting at age 23, this may be done  every 5 years if you have a Pap test in combination with an HPV test.  Bone density scan. This is done to screen for osteoporosis. You may have this scan if you are at high risk for osteoporosis. Discuss your test results, treatment options, and if necessary, the need for more tests with your health care provider. Vaccines  Your  health care provider may recommend certain vaccines, such as:  Influenza vaccine. This is recommended every year.  Tetanus, diphtheria, and acellular pertussis (Tdap, Td) vaccine. You may need a Td booster every 10 years.  Zoster vaccine. You may need this after age 6.  Pneumococcal 13-valent conjugate (PCV13) vaccine. You may need this if you have certain conditions and were not previously vaccinated.  Pneumococcal polysaccharide (PPSV23) vaccine. You may need one or two doses if you smoke cigarettes or if you have certain conditions. Talk to your health care provider about which screenings and vaccines you need and how often you need them. This information is not intended to replace advice given to you by your health care provider. Make sure you discuss any questions you have with your health care provider. Document Released: 04/26/2015 Document Revised: 12/18/2015 Document Reviewed: 01/29/2015 Elsevier Interactive Patient Education  2017 [employer].    Fall Prevention in the Home Falls can cause injuries. They can happen to people of all ages. There are many things you can do to make your home safe and to help prevent falls. What can I do on the outside of my home?  Regularly fix the edges of walkways and driveways and fix any cracks.  Remove anything that might make you trip as you walk through a door, such as a raised step or threshold.  Trim any bushes or trees on the path to your home.  Use bright outdoor lighting.  Clear any walking paths of anything that might make someone trip, such as rocks or tools.  Regularly check to see if handrails are loose or broken. Make sure that both sides of any steps have handrails.  Any raised decks and porches should have guardrails on the edges.  Have any leaves, snow, or ice cleared regularly.  Use sand or salt on walking paths during winter.  Clean up any spills in your garage right away. This includes oil or grease  spills. What can I do in the bathroom?  Use night lights.  Install grab bars by the toilet and in the tub and shower. Do not use towel bars as grab bars.  Use non-skid mats or decals in the tub or shower.  If you need to sit down in the shower, use a plastic, non-slip stool.  Keep the floor dry. Clean up any water that spills on the floor as soon as it happens.  Remove soap buildup in the tub or shower regularly.  Attach bath mats securely with double-sided non-slip rug tape.  Do not have throw rugs and other things on the floor that can make you trip. What can I do in the bedroom?  Use night lights.  Make sure that you have a light by your bed that is easy to reach.  Do not use any sheets or blankets that are too big for your bed. They should not hang down onto the floor.  Have a firm chair that has side arms. You can use this for support while you get dressed.  Do not have throw rugs and other things on the floor that can make you trip. What can  I do in the kitchen?  Clean up any spills right away.  Avoid walking on wet floors.  Keep items that you use a lot in easy-to-reach places.  If you need to reach something above you, use a strong step stool that has a grab bar.  Keep electrical cords out of the way.  Do not use floor polish or wax that makes floors slippery. If you must use wax, use non-skid floor wax.  Do not have throw rugs and other things on the floor that can make you trip. What can I do with my stairs?  Do not leave any items on the stairs.  Make sure that there are handrails on both sides of the stairs and use them. Fix handrails that are broken or loose. Make sure that handrails are as long as the stairways.  Check any carpeting to make sure that it is firmly attached to the stairs. Fix any carpet that is loose or worn.  Avoid having throw rugs at the top or bottom of the stairs. If you do have throw rugs, attach them to the floor with carpet  tape.  Make sure that you have a light switch at the top of the stairs and the bottom of the stairs. If you do not have them, ask someone to add them for you. What else can I do to help prevent falls?  Wear shoes that:  Do not have high heels.  Have rubber bottoms.  Are comfortable and fit you well.  Are closed at the toe. Do not wear sandals.  If you use a stepladder:  Make sure that it is fully opened. Do not climb a closed stepladder.  Make sure that both sides of the stepladder are locked into place.  Ask someone to hold it for you, if possible.  Clearly mark and make sure that you can see:  Any grab bars or handrails.  First and last steps.  Where the edge of each step is.  Use tools that help you move around (mobility aids) if they are needed. These include:  Canes.  Walkers.  Scooters.  Crutches.  Turn on the lights when you go into a dark area. Replace any light bulbs as soon as they burn out.  Set up your furniture so you have a clear path. Avoid moving your furniture around.  If any of your floors are uneven, fix them.  If there are any pets around you, be aware of where they are.  Review your medicines with your doctor. Some medicines can make you feel dizzy. This can increase your chance of falling. Ask your doctor what other things that you can do to help prevent falls. This information is not intended to replace advice given to you by your health care provider. Make sure you discuss any questions you have with your health care provider. Document Released: 01/24/2009 Document Revised: 09/05/2015 Document Reviewed: 05/04/2014 Elsevier Interactive Patient Education  2017 [employer].

## 2019-12-11 DIAGNOSIS — Z23 Encounter for immunization: Secondary | ICD-10-CM | POA: Diagnosis not present

## 2019-12-14 ENCOUNTER — Other Ambulatory Visit: Payer: Self-pay | Admitting: "Endocrinology

## 2019-12-15 ENCOUNTER — Telehealth: Payer: Self-pay | Admitting: "Endocrinology

## 2019-12-15 DIAGNOSIS — E1165 Type 2 diabetes mellitus with hyperglycemia: Secondary | ICD-10-CM

## 2019-12-15 DIAGNOSIS — I10 Essential (primary) hypertension: Secondary | ICD-10-CM

## 2019-12-15 DIAGNOSIS — E559 Vitamin D deficiency, unspecified: Secondary | ICD-10-CM

## 2019-12-15 NOTE — Telephone Encounter (Signed)
Lab orders updated and sent to [facility]. 

## 2019-12-15 NOTE — Telephone Encounter (Signed)
Can you update lab order for [facility]?

## 2019-12-21 DIAGNOSIS — E559 Vitamin D deficiency, unspecified: Secondary | ICD-10-CM | POA: Diagnosis not present

## 2019-12-21 DIAGNOSIS — I10 Essential (primary) hypertension: Secondary | ICD-10-CM | POA: Diagnosis not present

## 2019-12-22 LAB — COMPREHENSIVE METABOLIC PANEL
AG Ratio: 1.2 (calc) (ref 1.0–2.5)
ALT: 37 U/L — ABNORMAL HIGH (ref 6–29)
AST: 39 U/L — ABNORMAL HIGH (ref 10–35)
Albumin: 4.3 g/dL (ref 3.6–5.1)
Alkaline phosphatase (APISO): 62 U/L (ref 37–153)
BUN: 9 mg/dL (ref 7–25)
CO2: 26 mmol/L (ref 20–32)
Calcium: 9.4 mg/dL (ref 8.6–10.4)
Chloride: 102 mmol/L (ref 98–110)
Creat: 0.71 mg/dL (ref 0.50–0.99)
Globulin: 3.5 g/dL (calc) (ref 1.9–3.7)
Glucose, Bld: 148 mg/dL — ABNORMAL HIGH (ref 65–99)
Potassium: 4.2 mmol/L (ref 3.5–5.3)
Sodium: 138 mmol/L (ref 135–146)
Total Bilirubin: 0.6 mg/dL (ref 0.2–1.2)
Total Protein: 7.8 g/dL (ref 6.1–8.1)

## 2019-12-22 LAB — VITAMIN D 25 HYDROXY (VIT D DEFICIENCY, FRACTURES): Vit D, 25-Hydroxy: 16 ng/mL — ABNORMAL LOW (ref 30–100)

## 2019-12-22 LAB — MICROALBUMIN / CREATININE URINE RATIO
Creatinine, Urine: 182 mg/dL (ref 20–275)
Microalb Creat Ratio: 43 mcg/mg creat — ABNORMAL HIGH (ref <30)
Microalb, Ur: 7.9 mg/dL

## 2019-12-25 ENCOUNTER — Encounter: Payer: Self-pay | Admitting: Nurse Practitioner

## 2019-12-25 ENCOUNTER — Other Ambulatory Visit: Payer: Self-pay

## 2019-12-25 ENCOUNTER — Telehealth (INDEPENDENT_AMBULATORY_CARE_PROVIDER_SITE_OTHER): Payer: Medicare Other | Admitting: Nurse Practitioner

## 2019-12-25 DIAGNOSIS — E1165 Type 2 diabetes mellitus with hyperglycemia: Secondary | ICD-10-CM | POA: Diagnosis not present

## 2019-12-25 DIAGNOSIS — I10 Essential (primary) hypertension: Secondary | ICD-10-CM

## 2019-12-25 DIAGNOSIS — E782 Mixed hyperlipidemia: Secondary | ICD-10-CM | POA: Diagnosis not present

## 2019-12-25 DIAGNOSIS — E559 Vitamin D deficiency, unspecified: Secondary | ICD-10-CM | POA: Diagnosis not present

## 2019-12-25 MED ORDER — GLIPIZIDE ER 10 MG PO TB24: ORAL_TABLET | ORAL | 2 refills | Status: DC

## 2019-12-25 MED ORDER — VITAMIN D (ERGOCALCIFEROL) 1.25 MG (50000 UNIT) PO CAPS: 50000.0000 [IU] | ORAL_CAPSULE | ORAL | 0 refills | Status: DC

## 2019-12-25 MED ORDER — ROSUVASTATIN CALCIUM 20 MG PO TABS: ORAL_TABLET | ORAL | 2 refills | Status: DC

## 2019-12-25 NOTE — Patient Instructions (Signed)

## 2019-12-25 NOTE — Progress Notes (Signed)
12/25/2019, 1:25 PM   Endocrinology Follow Up Visit  TELEHEALTH VISIT: The patient is being engaged in telehealth visit due to COVID-19.  This type of visit limits physical examination significantly, and thus is not preferable over face-to-face encounters.  I connected with  Duane Boston on 12/25/19 by a video enabled telemedicine application and verified that I am speaking with the correct person using two identifiers.   I discussed the limitations of evaluation and management by telemedicine. The patient expressed understanding and agreed to proceed.    The participants involved in this visit include: Brita Romp, NP located at [facility] and Duane Boston  located at their personal residence listed.  Subjective:    Patient ID: Tracey [name], female    DOB: 1955-05-22.  Tracey [name] is being engaged in telehealth in follow-up  for management of currently uncontrolled symptomatic type 2 diabetes, hyperlipidemia, hypertension.  She is currently having cold-like symptoms, thus the reason for the virtual appointment.  PMD:   Fayrene Helper, MD.  Past Medical History:  Diagnosis Date  . Arthritis   . Diabetes mellitus without complication ([place])   . Diastolic dysfunction 04/5724   Grade 1  . GERD (gastroesophageal reflux disease)   . Hyperlipidemia   . Hyperlipidemia   . Hypertension 04/15/2012  . Morbid obesity ([place])   . PE (pulmonary embolism)   . Pulmonary emboli ([place]) 1999   post TAH  . Sleep apnea    Past Surgical History:  Procedure Laterality Date  . ABDOMINAL HYSTERECTOMY  1999   , totalfor fibroids  . CESAREAN SECTION    . CHOLECYSTECTOMY  1996  . COLONOSCOPY N/A 10/31/2015   Procedure: COLONOSCOPY;  Surgeon: Rogene Houston, MD;  Location: [facility];  Service: Endoscopy;  Laterality: N/A;  930    Social History   Socioeconomic History  . Marital status: Single    Spouse name: Not on file  . Number of children: Not on file  . Years of education: Not on file  . Highest education level: Not on file  Occupational History  . Not on file  Tobacco Use  . Smoking status: Never Smoker  . Smokeless tobacco: Never Used  Vaping Use  . Vaping Use: Never used  Substance and Sexual Activity  . Alcohol use: No    Alcohol/week: 0.0 standard drinks  . Drug use: No  . Sexual activity: Not Currently  Other Topics Concern  . Not on file  Social History Narrative  . Not on file   Social Determinants of Health   Financial Resource Strain:   . Difficulty of Paying Living Expenses: Not on file  Food Insecurity:   . Worried About Charity fundraiser in the Last Year: Not on file  . Ran Out of Food in the Last Year: Not on file  Transportation Needs:   . Lack of Transportation (Medical): Not on file  .  Lack of Transportation (Non-Medical): Not on file  Physical Activity:   . Days of Exercise per Week: Not on file  . Minutes of Exercise per Session: Not on file  Stress:   . Feeling of Stress : Not on file  Social Connections:   . Frequency of Communication with Friends and Family: Not on file  . Frequency of Social Gatherings with Friends and Family: Not on file  . Attends Religious Services: Not on file  . Active Member of Clubs or Organizations: Not on file  . Attends Archivist Meetings: Not on file  . Marital Status: Not on file   Outpatient Encounter Medications as of 12/25/2019  Medication Sig  . Accu-Chek Softclix Lancets lancets Use as instructed 2x daily dx E11.65  . aspirin EC 81 MG tablet Take 1 tablet (81 mg total) by mouth daily.  . clotrimazole (GYNE-LOTRIMIN 3) 2 % vaginal cream Place 1 Applicatorful vaginally at bedtime.  . Continuous Blood Gluc Sensor (FREESTYLE LIBRE 14 DAY SENSOR) MISC Inject 1 each into the skin every 14 (fourteen) days. Use as  directed.  Marland Kitchen glipiZIDE (GLUCOTROL XL) 10 MG 24 hr tablet TAKE ONE TABLET (10 MG TOTAL) BY MOUTH DAILY WITH BREAKFAST.  Marland Kitchen glucose blood (ACCU-CHEK AVIVA) test strip Use as instructed 4 x daily dx E11.65  . HUMULIN R U-500 KWIKPEN 500 UNIT/ML kwikpen INJECT 70 UNITS INTO THE SKIN 3 TIMES DAILY WITH MEALS.  Marland Kitchen ibuprofen (ADVIL,MOTRIN) 200 MG tablet Take 200 mg by mouth every 8 (eight) hours as needed for moderate pain.   . Insulin Pen Needle (UNIFINE PENTIPS) 31G X 8 MM MISC 1 each by Other route 3 (three) times daily.  . metFORMIN (GLUCOPHAGE) 1000 MG tablet TAKE ONE TABLET BY MOUTH TWICE A DAY WITH A MEAL  . metoprolol tartrate (LOPRESSOR) 50 MG tablet TAKE ONE TABLET (50MG  TOTAL) BY MOUTH TWO TIMES DAILY  . omeprazole (PRILOSEC) 20 MG capsule TAKE ONE CAPSULE BY MOUTH DAILY  . rosuvastatin (CRESTOR) 20 MG tablet TAKE ONE (1) TABLET BY MOUTH EVERY DAY  . spironolactone (ALDACTONE) 25 MG tablet TAKE ONE (1) TABLET BY MOUTH EVERY DAY  . [DISCONTINUED] glipiZIDE (GLUCOTROL XL) 10 MG 24 hr tablet TAKE ONE TABLET (10 MG TOTAL) BY MOUTH DAILY WITH BREAKFAST.  . [DISCONTINUED] rosuvastatin (CRESTOR) 20 MG tablet TAKE ONE (1) TABLET BY MOUTH EVERY DAY  . Vitamin D, Ergocalciferol, (DRISDOL) 1.25 MG (50000 UNIT) CAPS capsule Take 1 capsule (50,000 Units total) by mouth every 7 (seven) days.  . [DISCONTINUED] UNABLE TO FIND Bedside commode x 1  Dx m76.9, m54.4  . [DISCONTINUED] UNABLE TO FIND Shower bench x 1 Shower grab bar x 1  Dx: m76.9, m54.5   No facility-administered encounter medications on file as of 12/25/2019.    ALLERGIES: Allergies  Allergen Reactions  . Haloperidol Lactate Anaphylaxis  . Lipitor [Atorvastatin Calcium] Other (See Comments)    Markedly elevated liver enzymes    VACCINATION STATUS: Immunization History  Administered Date(s) Administered  . Influenza,inj,Quad PF,6+ Mos 04/01/2015, 03/16/2016, 01/20/2017, 04/11/2018, 02/13/2019  . Pneumococcal Polysaccharide-23  05/13/2015    Diabetes She presents for her follow-up diabetic visit. She has type 2 diabetes mellitus. Onset time: He was diagnosed at approximate age of 41 years. Her disease course has been stable. There are no hypoglycemic associated symptoms. Pertinent negatives for hypoglycemia include no confusion, headaches, pallor or seizures. Associated symptoms include polydipsia and polyuria. Pertinent negatives for diabetes include no blurred vision, no chest pain, no fatigue and  no polyphagia. There are no hypoglycemic complications. Symptoms are stable. There are no diabetic complications. Risk factors for coronary artery disease include diabetes mellitus, dyslipidemia, family history, hypertension, obesity, post-menopausal and sedentary lifestyle. Current diabetic treatment includes insulin injections and oral agent (dual therapy). She is compliant with treatment some of the time. Her weight is stable. She is following a generally unhealthy diet. When asked about meal planning, she reported none. She has had a previous visit with a dietitian. She never participates in exercise. Her dinner blood glucose range is generally 140-180 mg/dl. Her bedtime blood glucose range is generally 140-180 mg/dl. Her overall blood glucose range is 140-180 mg/dl. (She presents for her virtual visit today without logs or meter to review.  She says she is in the process of moving and has misplaced those items.  She reports her blood sugar has been reading in the lower 200s consistently.  She denies any episodes of hypoglycemia.  Her previous A1C was 7.9%, not checked prior to this virtual appt.) An ACE inhibitor/angiotensin II receptor blocker is not being taken. She does not see a podiatrist.Eye exam is not current.  Hyperlipidemia This is a chronic problem. The current episode started more than 1 year ago. The problem is controlled. Exacerbating diseases include diabetes and obesity. Factors aggravating her hyperlipidemia include  fatty foods. Pertinent negatives include no chest pain, myalgias or shortness of breath. Current antihyperlipidemic treatment includes statins. The current treatment provides moderate improvement of lipids. Compliance problems include adherence to exercise and adherence to diet.  Risk factors for coronary artery disease include diabetes mellitus, dyslipidemia, hypertension, obesity, a sedentary lifestyle and post-menopausal.  Hypertension This is a chronic problem. The current episode started more than 1 year ago. The problem has been gradually improving since onset. The problem is uncontrolled. Pertinent negatives include no blurred vision, chest pain, headaches, palpitations or shortness of breath. There are no associated agents to hypertension. Risk factors for coronary artery disease include dyslipidemia, diabetes mellitus, obesity, sedentary lifestyle and post-menopausal state. Past treatments include beta blockers and diuretics. The current treatment provides moderate improvement. Compliance problems include exercise and diet.    Review of systems  Constitutional: + Minimally fluctuating body weight,  current  There is no height or weight on file to calculate BMI. , no fatigue, no subjective hyperthermia, no subjective hypothermia Eyes: no blurry vision, no xerophthalmia ENT: no sore throat, no nodules palpated in throat, no dysphagia/odynophagia, no hoarseness Cardiovascular: no Chest Pain, no Shortness of Breath, no palpitations, no leg swelling Respiratory: no cough, no shortness of breath Gastrointestinal: no Nausea/Vomiting/Diarrhea Musculoskeletal: no muscle/joint aches Skin: no rashes, no hyperemia Neurological: no tremors, no numbness, no tingling, no dizziness Psychiatric: no depression, no anxiety    Objective:    There were no vitals taken for this visit.  Wt Readings from Last 3 Encounters:  11/30/19 (!) 381 lb (172.8 kg)  06/13/19 (!) 408 lb (185.1 kg)  05/26/19 (!) 382 lb  (173.3 kg)     BP Readings from Last 3 Encounters:  06/13/19 (!) 142/86  03/20/19 (!) 145/89  02/13/19 132/82    Physical Exam- Telehealth- significantly limited due to nature of visit  Constitutional: There is no height or weight on file to calculate BMI. , not in acute distress, normal state of mind Respiratory: Adequate breathing efforts    CMP     Component Value Date/Time   NA 138 12/21/2019 1252   K 4.2 12/21/2019 1252   CL 102 12/21/2019 1252  CO2 26 12/21/2019 1252   GLUCOSE 148 (H) 12/21/2019 1252   BUN 9 12/21/2019 1252   CREATININE 0.71 12/21/2019 1252   CALCIUM 9.4 12/21/2019 1252   PROT 7.8 12/21/2019 1252   ALBUMIN 3.9 07/13/2016 1430   AST 39 (H) 12/21/2019 1252   ALT 37 (H) 12/21/2019 1252   ALKPHOS 61 07/13/2016 1430   BILITOT 0.6 12/21/2019 1252   GFRNONAA 77 02/13/2019 1207   GFRAA 90 02/13/2019 1207    Diabetic Labs (most recent): Lab Results  Component Value Date   HGBA1C 7.9 (A) 06/13/2019   HGBA1C 9.8 (A) 03/20/2019   HGBA1C 10.9 (H) 11/18/2018    Lipid Panel     Component Value Date/Time   CHOL 130 02/13/2019 1207   TRIG 108 02/13/2019 1207   HDL 38 (L) 02/13/2019 1207   CHOLHDL 3.4 02/13/2019 1207   VLDL 23 07/13/2016 1430   LDLCALC 73 02/13/2019 1207      Lab Results  Component Value Date   TSH 0.69 02/13/2019   TSH 1.23 05/09/2018   TSH 0.95 07/13/2016   TSH 1.26 07/10/2015   TSH 1.382 11/17/2013   TSH 0.519 04/14/2012   TSH 1.525 11/07/2009      Lipid Panel     Component Value Date/Time   CHOL 130 02/13/2019 1207   TRIG 108 02/13/2019 1207   HDL 38 (L) 02/13/2019 1207   CHOLHDL 3.4 02/13/2019 1207   VLDL 23 07/13/2016 1430   LDLCALC 73 02/13/2019 1207     Assessment & Plan:   1. Uncontrolled type 2 diabetes mellitus with hyperglycemia ([place])  - Tracey [name] has currently uncontrolled symptomatic type 2 DM since 65 years of age. -She is now on insulin U500.    She presents for her virtual visit  today without logs or meter to review.  She says she is in the process of moving and has misplaced those items.  She reports her blood sugar has been reading in the lower 200s consistently.  She denies any episodes of hypoglycemia.  Her previous A1C was 7.9%, not checked prior to this virtual appt.  -her diabetes is complicated by obesity/sedentary life and Tracey [name] remains at a high risk for more acute and chronic complications which include CAD, CVA, CKD, retinopathy, and neuropathy. These are all discussed in detail with the patient.  - I have counseled her on diet management and weight loss, by adopting a carbohydrate restricted/protein rich diet.  - The patient admits there is a room for improvement in their diet and drink choices. -  Suggestion is made for the patient to avoid simple carbohydrates from their diet including Cakes, Sweet Desserts / Pastries, Ice Cream, Soda (diet and regular), Sweet Tea, Candies, Chips, Cookies, Sweet Pastries,  Store Bought Juices, Alcohol in Excess of  1-2 drinks a day, Artificial Sweeteners, Coffee Creamer, and "Sugar-free" Products. This will help patient to have stable blood glucose profile and potentially avoid unintended weight gain.   - I encouraged the patient to switch to  unprocessed or minimally processed complex starch and increased protein intake (animal or plant source), fruits, and vegetables.   - Patient is advised to stick to a routine mealtimes to eat 3 meals  a day and avoid unnecessary snacks ( to snack only to correct hypoglycemia).  - I have approached her with the following individualized plan to manage diabetes and patient agrees:   -There is a lack of engagement inhibiting optimal management of her diabetes to target. -  Given the lack of data, will recommend she stay on same diabetes medication regimen of U500 70 units three times daily with meals if blood glucose levels above 90 and she is eating.  She is to also continue  Metformin 1000 mg po twice daily with meals, and Glipizide 10 mg XL daily with breakfast.  -Encouraged patient to monitor blood glucose 4 times per day, before meals and at bedtime and notify the clinic if blood glucose levels are less than 70 or greater than 300 for 3 tests in a row. -She has the freestyle libre CGM, but has not yet made an appt with the nurse to learn how to use it.  She says she plans on coming to the office next week for instruction.  2) BP/HTN:  According to her previous visits, her blood pressure is not controlled to target.  She does not have a way of monitoring blood pressure at home.  She is advised to continue Metoprolol 50 mg po twice daily and continue spironolactone 25 mg po daily.  3) Lipids/HPL:  Her recent lipid panel from 02/13/2019 shows LDL at 73.  She is advised to Continue Crestor 20 mg po daily at bedtime.  Will recheck lipid panel prior to next visit.  Side effects and precautions discussed with her.  4) Vitamin D Deficiency: Her recent vitamin D level was 16.  She is not currently on any supplementation, however she has required prescription replenishment in the past.  I discussed and initiated ergocalciferol 50,000 units weekly x 12 weeks.  She will benefit from daily maintenance dose of Vitamin D 3 5000 units daily once prescription is complete.  5) Chronic Care/Health Maintenance: -she is on Statin medications and is encouraged to continue to follow up with Ophthalmology, Dentist,  Podiatrist at least yearly or according to recommendations, and advised to   stay away from smoking. I have recommended yearly flu vaccine and pneumonia vaccination at least every 5 years; moderate intensity exercise for up to 150 minutes weekly; and  sleep for at least 7 hours a day.  - I advised patient to maintain close follow up with Fayrene Helper, MD for primary care needs.  I spent 30 minutes dedicated to the care of this patient on the date of this encounter to  include pre-visit review of records, face-to-face time with the patient, and post visit ordering of  testing.    Please refer to Patient Instructions for Blood Glucose Monitoring and Insulin/Medications Dosing Guide"  in media tab for additional information. Please  also refer to " Patient Self Inventory" in the Media  tab for reviewed elements of pertinent patient history.  Duane Boston participated in the discussions, expressed understanding, and voiced agreement with the above plans.  All questions were answered to her satisfaction. she is encouraged to contact clinic should she have any questions or concerns prior to her return visit.   Follow up plan: - Return in about 3 months (around 03/25/2020) for Diabetes follow up, Previsit labs.  Rayetta Pigg, FNP-BC [facility] Phone: [phone] Fax: [phone]  12/25/2019, 1:25 PM  This note was partially dictated with voice recognition software. Similar sounding words can be transcribed inadequately or may not  be corrected upon review.

## 2020-01-03 ENCOUNTER — Other Ambulatory Visit: Payer: Self-pay | Admitting: "Endocrinology

## 2020-01-04 ENCOUNTER — Other Ambulatory Visit: Payer: Self-pay | Admitting: "Endocrinology

## 2020-01-25 ENCOUNTER — Other Ambulatory Visit: Payer: Self-pay | Admitting: Family Medicine

## 2020-01-31 ENCOUNTER — Encounter: Payer: Self-pay | Admitting: Family Medicine

## 2020-01-31 ENCOUNTER — Other Ambulatory Visit: Payer: Self-pay

## 2020-01-31 ENCOUNTER — Ambulatory Visit (INDEPENDENT_AMBULATORY_CARE_PROVIDER_SITE_OTHER): Payer: Medicare Other | Admitting: Family Medicine

## 2020-01-31 VITALS — BP 141/76 | HR 125 | Resp 16 | Ht 70.0 in | Wt 390.0 lb

## 2020-01-31 DIAGNOSIS — Z23 Encounter for immunization: Secondary | ICD-10-CM

## 2020-01-31 DIAGNOSIS — I10 Essential (primary) hypertension: Secondary | ICD-10-CM

## 2020-01-31 DIAGNOSIS — R928 Other abnormal and inconclusive findings on diagnostic imaging of breast: Secondary | ICD-10-CM | POA: Diagnosis not present

## 2020-01-31 DIAGNOSIS — E669 Obesity, unspecified: Secondary | ICD-10-CM

## 2020-01-31 DIAGNOSIS — E1169 Type 2 diabetes mellitus with other specified complication: Secondary | ICD-10-CM | POA: Diagnosis not present

## 2020-01-31 DIAGNOSIS — R000 Tachycardia, unspecified: Secondary | ICD-10-CM

## 2020-01-31 DIAGNOSIS — E782 Mixed hyperlipidemia: Secondary | ICD-10-CM | POA: Diagnosis not present

## 2020-01-31 LAB — POCT GLYCOSYLATED HEMOGLOBIN (HGB A1C): Hemoglobin A1C: 7.3 % — AB (ref 4.0–5.6)

## 2020-01-31 NOTE — Assessment & Plan Note (Signed)
  Patient re-educated about  the importance of commitment to a  minimum of 150 minutes of exercise per week as able.  The importance of healthy food choices with portion control discussed, as well as eating regularly and within a 12 hour window most days. The need to choose "clean , green" food 50 to 75% of the time is discussed, as well as to make water the primary drink and set a goal of 64 ounces water daily.    Weight /BMI 01/31/2020 11/30/2019 06/13/2019  WEIGHT 390 lb 381 lb 408 lb  HEIGHT 5\' 10"  5\' 10"  5\' 10"   BMI 55.96 kg/m2 54.67 kg/m2 58.54 kg/m2

## 2020-01-31 NOTE — Progress Notes (Signed)
Tracey [name]     MRN: [id number]      DOB: 01/06/1956   HPI Tracey [name] is here for follow up and re-evaluation of chronic medical conditions, medication management and review of any available recent lab and radiology data.  Preventive health is updated, specifically  Cancer screening and Immunization.    The PT denies any adverse reactions to current medications since the last visit.  There are no new concerns.  There are no specific complaints  Denies polyuria, polydipsia, blurred vision , or hypoglycemic episodes.   ROS Denies recent fever or chills. Denies sinus pressure, nasal congestion, ear pain or sore throat. Denies chest congestion, productive cough or wheezing. Denies chest pains, palpitations and leg swelling Denies abdominal pain, nausea, vomiting,diarrhea or constipation.   Denies dysuria, frequency, hesitancy or incontinence. C/o chronic  joint pain, swelling and limitation in mobility. Denies headaches, seizures, numbness, or tingling. Denies depression, anxiety or insomnia. Denies skin break down or rash.   PE  BP (!) 141/76   Pulse (!) 125   Resp 16   Ht 5\' 10"  (1.778 m)   Wt (!) 390 lb (176.9 kg)   BMI 55.96 kg/m   Patient alert and oriented and in no cardiopulmonary distress.  HEENT: No facial asymmetry, EOMI,     Neck supple .  Chest: Clear to auscultation bilaterally.  CVS: S1, S2 no murmurs, no S3.Regular rate.  ABD: Soft non tender.   Ext: No edema  MS: decreased  ROM spine, shoulders, hips and knees.  Skin: Intact, no ulcerations or rash noted.  Psych: Good eye contact, normal affect. Memory intact not anxious or depressed appearing.  CNS: CN 2-12 intact, power,  normal throughout.no focal deficits noted.   Assessment & Plan Uncontrolled type 2 diabetes mellitus with hyperglycemia ([place]) Tracey [name] is reminded of the importance of commitment to daily physical activity for 30 minutes or more, as able and the need to limit  carbohydrate intake to 30 to 60 grams per meal to help with blood sugar control.   The need to take medication as prescribed, test blood sugar as directed, and to call between visits if there is a concern that blood sugar is uncontrolled is also discussed.   Tracey [name] is reminded of the importance of daily foot exam, annual eye examination, and good blood sugar, blood pressure and cholesterol control. Improved managed by Endo  Diabetic Labs Latest Ref Rng & Units 01/31/2020 12/21/2019 06/13/2019 03/20/2019 02/13/2019  HbA1c 4.0 - 5.6 % 7.3(A) - 7.9(A) 9.8(A) -  Microalbumin mg/dL - 7.9 - - -  Micro/Creat Ratio <30 mcg/mg creat - 43(H) - - -  Chol <200 mg/dL - - - - 130  HDL > OR = 50 mg/dL - - - - 38(L)  Calc LDL mg/dL (calc) - - - - 73  Triglycerides <150 mg/dL - - - - 108  Creatinine 0.50 - 0.99 mg/dL - 0.71 - - 0.82   BP/Weight 01/31/2020 11/30/2019 06/13/2019 05/26/2019 03/20/2019 02/13/2019 81/15/7262  Systolic BP 035 - 597 - 416 384 536  Diastolic BP 76 - 86 - 89 82 84  Wt. (Lbs) 390 381 408 382 405 391 380  BMI 55.96 54.67 58.54 54.81 58.11 56.1 54.52   Foot/eye exam completion dates Latest Ref Rng & Units 02/13/2019 08/19/2016  Eye Exam No Retinopathy - No Retinopathy  Foot Form Completion - Done -        Tachycardia Rapid heart rate, needs re evaluation by  Cardiology,  refer   Essential hypertension, benign Currently uncontrolled , refer to Cardiology  Diabetes mellitus type 2 in obese [facility]) Tracey [name] is reminded of the importance of commitment to daily physical activity for 30 minutes or more, as able and the need to limit carbohydrate intake to 30 to 60 grams per meal to help with blood sugar control.   The need to take medication as prescribed, test blood sugar as directed, and to call between visits if there is a concern that blood sugar is uncontrolled is also discussed.   Tracey [name] is reminded of the importance of daily foot exam, annual eye examination, and good  blood sugar, blood pressure and cholesterol control.  Diabetic Labs Latest Ref Rng & Units 01/31/2020 12/21/2019 06/13/2019 03/20/2019 02/13/2019  HbA1c 4.0 - 5.6 % 7.3(A) - 7.9(A) 9.8(A) -  Microalbumin mg/dL - 7.9 - - -  Micro/Creat Ratio <30 mcg/mg creat - 43(H) - - -  Chol <200 mg/dL - - - - 130  HDL > OR = 50 mg/dL - - - - 38(L)  Calc LDL mg/dL (calc) - - - - 73  Triglycerides <150 mg/dL - - - - 108  Creatinine 0.50 - 0.99 mg/dL - 0.71 - - 0.82   BP/Weight 01/31/2020 11/30/2019 06/13/2019 05/26/2019 03/20/2019 02/13/2019 16/01/9603  Systolic BP 540 - 981 - 191 478 295  Diastolic BP 76 - 86 - 89 82 84  Wt. (Lbs) 390 381 408 382 405 391 380  BMI 55.96 54.67 58.54 54.81 58.11 56.1 54.52   Foot/eye exam completion dates Latest Ref Rng & Units 02/13/2019 08/19/2016  Eye Exam No Retinopathy - No Retinopathy  Foot Form Completion - Done -   Controlled, no change in medication      Morbid obesity  Patient re-educated about  the importance of commitment to a  minimum of 150 minutes of exercise per week as able.  The importance of healthy food choices with portion control discussed, as well as eating regularly and within a 12 hour window most days. The need to choose "clean , green" food 50 to 75% of the time is discussed, as well as to make water the primary drink and set a goal of 64 ounces water daily.    Weight /BMI 01/31/2020 11/30/2019 06/13/2019  WEIGHT 390 lb 381 lb 408 lb  HEIGHT 5\' 10"  5\' 10"  5\' 10"   BMI 55.96 kg/m2 54.67 kg/m2 58.54 kg/m2

## 2020-01-31 NOTE — Assessment & Plan Note (Signed)
Rapid heart rate, needs re evaluation by  Cardiology, refer

## 2020-01-31 NOTE — Patient Instructions (Addendum)
F/u with pap and lab review in next 2 to 3 weeks with MD, call if you need me sooner  Glyco hB in office today shows improved and good control which is GREAT  Flu vaccine today  You are referred for eye exam  Nurse please refer for diagnostic right mammogram and possible [place] to evaluate calcifications due since 10/2019 ( may uses Jan report for reference)  Labs today, lipid, hepatic, TSH and free t4 and CBC  Be careful not to fall  Please follow diabetic diet with smaller portions to improve health  Thanks for choosing [facility], we consider it a privelige to serve you.

## 2020-01-31 NOTE — Assessment & Plan Note (Signed)
Ms. Gatti is reminded of the importance of commitment to daily physical activity for 30 minutes or more, as able and the need to limit carbohydrate intake to 30 to 60 grams per meal to help with blood sugar control.   The need to take medication as prescribed, test blood sugar as directed, and to call between visits if there is a concern that blood sugar is uncontrolled is also discussed.   Ms. Raisanen is reminded of the importance of daily foot exam, annual eye examination, and good blood sugar, blood pressure and cholesterol control. Improved managed by Endo  Diabetic Labs Latest Ref Rng & Units 01/31/2020 12/21/2019 06/13/2019 03/20/2019 02/13/2019  HbA1c 4.0 - 5.6 % 7.3(A) - 7.9(A) 9.8(A) -  Microalbumin mg/dL - 7.9 - - -  Micro/Creat Ratio <30 mcg/mg creat - 43(H) - - -  Chol <200 mg/dL - - - - 130  HDL > OR = 50 mg/dL - - - - 38(L)  Calc LDL mg/dL (calc) - - - - 73  Triglycerides <150 mg/dL - - - - 108  Creatinine 0.50 - 0.99 mg/dL - 0.71 - - 0.82   BP/Weight 01/31/2020 11/30/2019 06/13/2019 05/26/2019 03/20/2019 02/13/2019 92/34/1443  Systolic BP 601 - 658 - 006 349 494  Diastolic BP 76 - 86 - 89 82 84  Wt. (Lbs) 390 381 408 382 405 391 380  BMI 55.96 54.67 58.54 54.81 58.11 56.1 54.52   Foot/eye exam completion dates Latest Ref Rng & Units 02/13/2019 08/19/2016  Eye Exam No Retinopathy - No Retinopathy  Foot Form Completion - Done -

## 2020-01-31 NOTE — Assessment & Plan Note (Signed)
Tracey [name] is reminded of the importance of commitment to daily physical activity for 30 minutes or more, as able and the need to limit carbohydrate intake to 30 to 60 grams per meal to help with blood sugar control.   The need to take medication as prescribed, test blood sugar as directed, and to call between visits if there is a concern that blood sugar is uncontrolled is also discussed.   Tracey [name] is reminded of the importance of daily foot exam, annual eye examination, and good blood sugar, blood pressure and cholesterol control.  Diabetic Labs Latest Ref Rng & Units 01/31/2020 12/21/2019 06/13/2019 03/20/2019 02/13/2019  HbA1c 4.0 - 5.6 % 7.3(A) - 7.9(A) 9.8(A) -  Microalbumin mg/dL - 7.9 - - -  Micro/Creat Ratio <30 mcg/mg creat - 43(H) - - -  Chol <200 mg/dL - - - - 130  HDL > OR = 50 mg/dL - - - - 38(L)  Calc LDL mg/dL (calc) - - - - 73  Triglycerides <150 mg/dL - - - - 108  Creatinine 0.50 - 0.99 mg/dL - 0.71 - - 0.82   BP/Weight 01/31/2020 11/30/2019 06/13/2019 05/26/2019 03/20/2019 02/13/2019 83/38/2505  Systolic BP 397 - 673 - 419 379 024  Diastolic BP 76 - 86 - 89 82 84  Wt. (Lbs) 390 381 408 382 405 391 380  BMI 55.96 54.67 58.54 54.81 58.11 56.1 54.52   Foot/eye exam completion dates Latest Ref Rng & Units 02/13/2019 08/19/2016  Eye Exam No Retinopathy - No Retinopathy  Foot Form Completion - Done -   Controlled, no change in medication

## 2020-01-31 NOTE — Assessment & Plan Note (Signed)
Currently uncontrolled , refer to Cardiology

## 2020-02-01 ENCOUNTER — Other Ambulatory Visit: Payer: Self-pay

## 2020-02-01 DIAGNOSIS — R928 Other abnormal and inconclusive findings on diagnostic imaging of breast: Secondary | ICD-10-CM

## 2020-02-01 DIAGNOSIS — E669 Obesity, unspecified: Secondary | ICD-10-CM | POA: Diagnosis not present

## 2020-02-01 DIAGNOSIS — E782 Mixed hyperlipidemia: Secondary | ICD-10-CM | POA: Diagnosis not present

## 2020-02-01 DIAGNOSIS — E1169 Type 2 diabetes mellitus with other specified complication: Secondary | ICD-10-CM | POA: Diagnosis not present

## 2020-02-02 LAB — LIPID PANEL
Cholesterol: 133 mg/dL (ref <200)
HDL: 38 mg/dL — ABNORMAL LOW (ref >=50)
LDL Cholesterol (Calc): 75 mg/dL (calc)
Non-HDL Cholesterol (Calc): 95 mg/dL (calc) (ref <130)
Total CHOL/HDL Ratio: 3.5 (calc) (ref <5.0)
Triglycerides: 117 mg/dL (ref <150)

## 2020-02-02 LAB — HEPATIC FUNCTION PANEL
AG Ratio: 1.2 (calc) (ref 1.0–2.5)
ALT: 33 U/L — ABNORMAL HIGH (ref 6–29)
AST: 40 U/L — ABNORMAL HIGH (ref 10–35)
Albumin: 4.2 g/dL (ref 3.6–5.1)
Alkaline phosphatase (APISO): 53 U/L (ref 37–153)
Bilirubin, Direct: 0.2 mg/dL (ref 0.0–0.2)
Globulin: 3.4 g/dL (calc) (ref 1.9–3.7)
Indirect Bilirubin: 0.4 mg/dL (calc) (ref 0.2–1.2)
Total Bilirubin: 0.6 mg/dL (ref 0.2–1.2)
Total Protein: 7.6 g/dL (ref 6.1–8.1)

## 2020-02-02 LAB — CBC
HCT: 43 % (ref 35.0–45.0)
Hemoglobin: 13.3 g/dL (ref 11.7–15.5)
MCH: 23.3 pg — ABNORMAL LOW (ref 27.0–33.0)
MCHC: 30.9 g/dL — ABNORMAL LOW (ref 32.0–36.0)
MCV: 75.4 fL — ABNORMAL LOW (ref 80.0–100.0)
MPV: 11.4 fL (ref 7.5–12.5)
Platelets: 314 10*3/uL (ref 140–400)
RBC: 5.7 10*6/uL — ABNORMAL HIGH (ref 3.80–5.10)
RDW: 17.2 % — ABNORMAL HIGH (ref 11.0–15.0)
WBC: 10.7 10*3/uL (ref 3.8–10.8)

## 2020-02-02 LAB — TSH+FREE T4: TSH W/REFLEX TO FT4: 0.98 mIU/L (ref 0.40–4.50)

## 2020-02-14 ENCOUNTER — Ambulatory Visit (INDEPENDENT_AMBULATORY_CARE_PROVIDER_SITE_OTHER): Payer: Medicare Other | Admitting: Podiatry

## 2020-02-14 ENCOUNTER — Encounter: Payer: Self-pay | Admitting: Podiatry

## 2020-02-14 ENCOUNTER — Other Ambulatory Visit: Payer: Self-pay

## 2020-02-14 DIAGNOSIS — M79675 Pain in left toe(s): Secondary | ICD-10-CM

## 2020-02-14 DIAGNOSIS — M7989 Other specified soft tissue disorders: Secondary | ICD-10-CM

## 2020-02-14 DIAGNOSIS — M79674 Pain in right toe(s): Secondary | ICD-10-CM

## 2020-02-14 DIAGNOSIS — B351 Tinea unguium: Secondary | ICD-10-CM | POA: Diagnosis not present

## 2020-02-14 DIAGNOSIS — E1142 Type 2 diabetes mellitus with diabetic polyneuropathy: Secondary | ICD-10-CM

## 2020-02-14 DIAGNOSIS — R600 Localized edema: Secondary | ICD-10-CM

## 2020-02-18 NOTE — Progress Notes (Signed)
Subjective: Tracey [name] presents today for follow up of preventative diabetic foot care, for diabetic foot evaluation and painful mycotic nails b/l that are difficult to trim. Pain interferes with ambulation. Aggravating factors include wearing enclosed shoe gear. Pain is relieved with periodic professional debridement.   She did not check blood glucose this morning.  She is concerned about lesion on the outside of her right foot. Unsure how long it has been there. Lesion bleeds when traumatized.   Allergies  Allergen Reactions  . Haloperidol Lactate Anaphylaxis  . Lipitor [Atorvastatin Calcium] Other (See Comments)    Markedly elevated liver enzymes     Objective: There were no vitals filed for this visit.  Vascular Examination:  Capillary refill time to digits immediate b/l. Palpable pedal pulses b/l LE. Pedal hair sparse. Lower extremity skin temperature gradient within normal limits. No pain with calf compression b/l. Nonpitting edema noted b/l lower extremities.  Dermatological Examination: Pedal skin with normal turgor, texture and tone bilaterally, no open wounds bilaterally, no interdigital macerations bilaterally and toenails 1-5 b/l elongated, dystrophic, thickened, crumbly with subungual debris.     Right inferolateral ankle reveals raised, flesh colored soft tissue mass, annular, elevated and measures 0.3 cm in diameter. Dried blood visible. +Tenderness to palpation. No purulence, no surrounding erythema, no odor.   Musculoskeletal: Normal muscle strength 5/5 to all lower extremity muscle groups bilaterally, no gross bony deformities bilaterally and no pain crepitus or joint limitation noted with ROM b/l  Neurological: Protective sensation decreased with 10 gram monofilament b/l  Assessment: 1. Pain due to onychomycosis of toenails of both feet   2. Localized edema   3. Mass of soft tissue of ankle   4. Diabetic peripheral neuropathy associated with type 2  diabetes mellitus ([place])     Plan: -Examined patient. -Continue diabetic foot care principles daily. -Evaluated soft tissue mass right ankle. Refer to Dr. Lanae Crumbly for further work-up. -Continue diabetic foot care principles. Literature dispensed on today.  -Toenails 1-5 b/l were debrided in length and girth with sterile nail nippers and dremel without iatrogenic bleeding. -Patient to continue soft, supportive shoe gear daily. -Patient to report any pedal injuries to medical professional immediately. -Patient/POA to call should there be question/concern in the interim.  Return in about 3 months (around 05/16/2020).

## 2020-02-19 ENCOUNTER — Ambulatory Visit: Payer: Medicare Other | Admitting: Podiatry

## 2020-02-20 ENCOUNTER — Telehealth (INDEPENDENT_AMBULATORY_CARE_PROVIDER_SITE_OTHER): Payer: Medicare Other | Admitting: Family Medicine

## 2020-02-20 ENCOUNTER — Other Ambulatory Visit: Payer: Self-pay

## 2020-02-20 VITALS — BP 140/76 | Ht 70.0 in | Wt 390.0 lb

## 2020-02-20 DIAGNOSIS — J349 Unspecified disorder of nose and nasal sinuses: Secondary | ICD-10-CM

## 2020-02-21 NOTE — Progress Notes (Signed)
Unable to contact patoient , visit canmcelled and to be rescheduled

## 2020-03-04 ENCOUNTER — Encounter: Payer: Self-pay | Admitting: Internal Medicine

## 2020-03-04 ENCOUNTER — Other Ambulatory Visit: Payer: Self-pay

## 2020-03-04 ENCOUNTER — Ambulatory Visit (INDEPENDENT_AMBULATORY_CARE_PROVIDER_SITE_OTHER): Payer: Medicare Other | Admitting: Internal Medicine

## 2020-03-04 VITALS — BP 138/64 | HR 92 | Ht 70.0 in | Wt 390.8 lb

## 2020-03-04 DIAGNOSIS — E782 Mixed hyperlipidemia: Secondary | ICD-10-CM

## 2020-03-04 MED ORDER — VITAMIN D-3 125 MCG (5000 UT) PO TABS: 5000.0000 [IU] | ORAL_TABLET | Freq: Every day | ORAL | 6 refills | Status: DC

## 2020-03-04 NOTE — Progress Notes (Signed)
Cardiology Office Note   Date:  03/04/2020   ID:  Brailynn, Breth [birth year], MRN [id number]  PCP:  Fayrene Helper, MD  Cardiologist:   [name], MD   Pt presents for f/u of HTN and HL and CP     History of Present Illness: BRYER GOTTSCH is a 64 y.o. female with a history of CP, DM, HTN, HL and morbid obesity.  Dobutamine echo in 2014 normal   Echo in 2020 LVEF normal   I last saw her  As a televisit in Feb 2021   Since seen she has been doing OK  She has rare fleeting CP She gets SOB with stairs (actually dizzy, heart races)   Has to climb 15 to her BR which is upstairs.     She has fallen occasionally  Seen by Dr Moshe Cipro in Oct   Labs :   LDL 75   AST/ALT miniamlly elevated   Note fatty liver on CT a fw years ago  PT skips breakfast   Lunch  Sandwich  And sweet tea.  Dinner  WIll have some sweet tea/lemonade         Current Meds  Medication Sig  . Accu-Chek Softclix Lancets lancets Use as instructed 2x daily dx E11.65  . aspirin EC 81 MG tablet Take 1 tablet (81 mg total) by mouth daily.  . Continuous Blood Gluc Sensor (FREESTYLE LIBRE 14 DAY SENSOR) MISC Inject 1 each into the skin every 14 (fourteen) days. Use as directed.  Marland Kitchen glipiZIDE (GLUCOTROL XL) 10 MG 24 hr tablet TAKE ONE TABLET (10 MG TOTAL) BY MOUTH DAILY WITH BREAKFAST.  Marland Kitchen glucose blood (ACCU-CHEK AVIVA) test strip Use as instructed 4 x daily dx E11.65  . HUMULIN R U-500 KWIKPEN 500 UNIT/ML kwikpen INJECT 70 UNITS INTO THE SKIN 3 TIMES DAILY WITH MEALS.  . metFORMIN (GLUCOPHAGE) 1000 MG tablet TAKE ONE TABLET BY MOUTH TWICE A DAY WITH A MEAL  . metoprolol tartrate (LOPRESSOR) 50 MG tablet TAKE ONE TABLET (50MG  TOTAL) BY MOUTH TWO TIMES DAILY  . omeprazole (PRILOSEC) 20 MG capsule TAKE ONE CAPSULE BY MOUTH DAILY  . rosuvastatin (CRESTOR) 20 MG tablet TAKE ONE (1) TABLET BY MOUTH EVERY DAY  . spironolactone (ALDACTONE) 25 MG tablet TAKE ONE (1) TABLET BY MOUTH EVERY DAY  . UNIFINE PENTIPS  31G X 8 MM MISC USE FOR INSULIN INJECTIONS THREE TIMES DAILY.  Marland Kitchen Vitamin D, Ergocalciferol, (DRISDOL) 1.25 MG (50000 UNIT) CAPS capsule Take 1 capsule (50,000 Units total) by mouth every 7 (seven) days.     Allergies:   Haloperidol lactate and Lipitor [atorvastatin calcium]   Past Medical History:  Diagnosis Date  . Arthritis   . Diabetes mellitus without complication ([place])   . Diastolic dysfunction 09/3783   Grade 1  . GERD (gastroesophageal reflux disease)   . Hyperlipidemia   . Hyperlipidemia   . Hypertension 04/15/2012  . Morbid obesity ([place])   . PE (pulmonary embolism)   . Pulmonary emboli ([place]) 1999   post TAH  . Sleep apnea     Past Surgical History:  Procedure Laterality Date  . ABDOMINAL HYSTERECTOMY  1999   , totalfor fibroids  . CESAREAN SECTION    . CHOLECYSTECTOMY  1996  . COLONOSCOPY N/A 10/31/2015   Procedure: COLONOSCOPY;  Surgeon: Rogene Houston, MD;  Location: [facility];  Service: Endoscopy;  Laterality: N/A;  930     Social History:  The patient  reports that she  has never smoked. She has never used smokeless tobacco. She reports that she does not drink alcohol and does not use drugs.   Family History:  The patient's family history includes Alcohol abuse in her father; Aneurysm in her brother; Arthritis in her sister; Cirrhosis in her father; Coronary artery disease in her sister; Depression in her mother and son; Diabetes in her mother; Heart attack (age of onset: 30) in her sister; Heart murmur in her son; Hyperlipidemia in her father; Hypertension in her father, mother, and son; Sleep apnea in her sister.    ROS:  Please see the history of present illness. All other systems are reviewed and  Negative to the above problem except as noted.    PHYSICAL EXAM: VS:  Ht 5\' 10"  (1.778 m)   Wt (!) 390 lb 12.8 oz (177.3 kg)   BMI 56.07 kg/m   GEN: Morbidly obese 64 yo in no acute distress  HEENT: normal  Neck: no obvious JVD, carotid bruits, Cardiac:  RRR; no murmurs,,np LE  edema  Respiratory:  clear to auscultation bilaterally, normal work of breathing GI: soft, Obese MS: no deformity Moving all extremities   Skin: warm and dry, no rash Neuro:  Strength and sensation are intact Psych: euthymic mood, full affect   EKG:  EKG is not ordered today.   Lipid Panel    Component Value Date/Time   CHOL 133 02/01/2020 1415   TRIG 117 02/01/2020 1415   HDL 38 (L) 02/01/2020 1415   CHOLHDL 3.5 02/01/2020 1415   VLDL 23 07/13/2016 1430   LDLCALC 75 02/01/2020 1415      Wt Readings from Last 3 Encounters:  03/04/20 (!) 390 lb 12.8 oz (177.3 kg)  02/20/20 (!) 390 lb (176.9 kg)  01/31/20 (!) 390 lb (176.9 kg)      ASSESSMENT AND PLAN:  1  Chest pain  Rare   Fleeting  I do not think cardiac   2  HTN BP is OK today   3  DM Last A1C was 7.3   Discussed sugar     4  HL  Recent lipid panel is pretty good   WIll elevation of LFTs though I would hold crestor for 1 month and recheck   If unchanged then I would resum   If normalized will look for another agent.   Most like the mild elevation is due to fatty liver and diet/wt.  We discussed   Unfort I do not think there is a bariatic clinci hear  5  Morbid obesity  INcrase activity   Will look into other options  Pt does not want surgery      Current medicines are reviewed at length with the patient today.  The patient does not have concerns regarding medicines.  Signed, [name], MD  03/04/2020 1:27 PM    [facility] [address], [place], [place]  [address] Phone: [phone]; Fax: [phone]

## 2020-03-04 NOTE — Patient Instructions (Addendum)
Medication Instructions:  Your physician recommends that you continue on your current medications as directed. Please refer to the Current Medication list given to you today.  Hold Crestor for 30 days.  Take Vitamin D 5,000 units Daily   *If you need a refill on your cardiac medications before your next appointment, please call your pharmacy*   Lab Work: NONE   If you have labs (blood work) drawn today and your tests are completely normal, you will receive your results only by: Marland Kitchen MyChart Message (if you have MyChart) OR . A paper copy in the mail If you have any lab test that is abnormal or we need to change your treatment, we will call you to review the results.   Testing/Procedures: NONE     Follow-Up: At [facility], you and your health needs are our priority.  As part of our continuing mission to provide you with exceptional heart care, we have created designated Provider Care Teams.  These Care Teams include your primary Cardiologist (physician) and Advanced Practice Providers (APPs -  Physician Assistants and Nurse Practitioners) who all work together to provide you with the care you need, when you need it.  We recommend signing up for the patient portal called "MyChart".  Sign up information is provided on this After Visit Summary.  MyChart is used to connect with patients for Virtual Visits (Telemedicine).  Patients are able to view lab/test results, encounter notes, upcoming appointments, etc.  Non-urgent messages can be sent to your provider as well.   To learn more about what you can do with MyChart, go to [email or web address].    Your next appointment:   6 month(s)  The format for your next appointment:   In Person  Provider:   [name], MD   Other Instructions Thank you for choosing [facility]!

## 2020-03-12 ENCOUNTER — Encounter (HOSPITAL_COMMUNITY): Payer: Medicare Other

## 2020-03-12 ENCOUNTER — Ambulatory Visit (HOSPITAL_COMMUNITY): Payer: Medicare Other

## 2020-03-22 ENCOUNTER — Other Ambulatory Visit: Payer: Self-pay | Admitting: Family Medicine

## 2020-03-26 ENCOUNTER — Ambulatory Visit: Payer: Medicare Other | Admitting: Nurse Practitioner

## 2020-04-09 ENCOUNTER — Ambulatory Visit (HOSPITAL_COMMUNITY): Payer: Medicare Other

## 2020-04-09 ENCOUNTER — Other Ambulatory Visit (HOSPITAL_COMMUNITY): Payer: Medicare Other

## 2020-04-09 ENCOUNTER — Ambulatory Visit (HOSPITAL_COMMUNITY): Admission: RE | Admit: 2020-04-09 | Payer: Medicare Other | Source: Ambulatory Visit

## 2020-04-11 ENCOUNTER — Telehealth (INDEPENDENT_AMBULATORY_CARE_PROVIDER_SITE_OTHER): Payer: Medicare Other | Admitting: Nurse Practitioner

## 2020-04-11 ENCOUNTER — Encounter: Payer: Self-pay | Admitting: Nurse Practitioner

## 2020-04-11 ENCOUNTER — Other Ambulatory Visit: Payer: Self-pay

## 2020-04-11 DIAGNOSIS — E559 Vitamin D deficiency, unspecified: Secondary | ICD-10-CM | POA: Diagnosis not present

## 2020-04-11 DIAGNOSIS — E782 Mixed hyperlipidemia: Secondary | ICD-10-CM | POA: Diagnosis not present

## 2020-04-11 DIAGNOSIS — E1165 Type 2 diabetes mellitus with hyperglycemia: Secondary | ICD-10-CM

## 2020-04-11 DIAGNOSIS — I10 Essential (primary) hypertension: Secondary | ICD-10-CM

## 2020-04-11 MED ORDER — GLUCOSE BLOOD VI STRP
ORAL_STRIP | 5 refills | Status: DC
Start: 2020-04-11 — End: 2020-11-05

## 2020-04-11 NOTE — Patient Instructions (Signed)

## 2020-04-11 NOTE — Progress Notes (Signed)
04/11/2020, 1:13 PM   Endocrinology Follow Up Visit  TELEHEALTH VISIT: The patient is being engaged in telehealth visit due to COVID-19.  This type of visit limits physical examination significantly, and thus is not preferable over face-to-face encounters.  I connected with  Edward Jolly on 04/11/20 by a video enabled telemedicine application and verified that I am speaking with the correct person using two identifiers.   I discussed the limitations of evaluation and management by telemedicine. The patient expressed understanding and agreed to proceed.    The participants involved in this visit include: Dani Gobble, NP located at [facility] and Edward Jolly  located at their personal residence listed.  Subjective:    Patient ID: Tracey [name], female    DOB: 02-22-1956.  Tracey [name] is being engaged in telehealth in follow-up  for management of currently uncontrolled symptomatic type 2 diabetes, hyperlipidemia, hypertension.  She is currently having cold-like symptoms, thus the reason for the virtual appointment.  PMD:   Kerri Perches, MD.  Past Medical History:  Diagnosis Date  . Arthritis   . Diabetes mellitus without complication (HCC)   . Diastolic dysfunction 10/2009   Grade 1  . GERD (gastroesophageal reflux disease)   . Hyperlipidemia   . Hyperlipidemia   . Hypertension 04/15/2012  . Morbid obesity (HCC)   . PE (pulmonary embolism)   . Pulmonary emboli (HCC) 1999   post TAH  . Sleep apnea    Past Surgical History:  Procedure Laterality Date  . ABDOMINAL HYSTERECTOMY  1999   , totalfor fibroids  . CESAREAN SECTION    . CHOLECYSTECTOMY  1996  . COLONOSCOPY N/A 10/31/2015   Procedure: COLONOSCOPY;  Surgeon: Malissa Hippo, MD;  Location: [facility];  Service: Endoscopy;  Laterality: N/A;  930    Social History   Socioeconomic History  . Marital status: Single    Spouse name: Not on file  . Number of children: Not on file  . Years of education: Not on file  . Highest education level: Not on file  Occupational History  . Not on file  Tobacco Use  . Smoking status: Never Smoker  . Smokeless tobacco: Never Used  Vaping Use  . Vaping Use: Never used  Substance and Sexual Activity  . Alcohol use: No    Alcohol/week: 0.0 standard drinks  . Drug use: No  . Sexual activity: Not Currently  Other Topics Concern  . Not on file  Social History Narrative  . Not on file   Social Determinants of Health   Financial Resource Strain: Not on file  Food Insecurity: Not on file  Transportation Needs: Not on file  Physical Activity: Not on file  Stress: Not on file  Social Connections: Not on file   Outpatient Encounter Medications as of 04/11/2020  Medication Sig  . Accu-Chek Softclix Lancets lancets Use as instructed 2x daily dx E11.65  .  acetaminophen (TYLENOL) 500 MG tablet Take 500 mg by mouth every 6 (six) hours as needed for moderate pain.  Marland Kitchen aspirin EC 81 MG tablet Take 1 tablet (81 mg total) by mouth daily.  . Cholecalciferol (VITAMIN D-3) 125 MCG (5000 UT) TABS Take 5,000 Units by mouth daily.  Marland Kitchen glipiZIDE (GLUCOTROL XL) 10 MG 24 hr tablet TAKE ONE TABLET (10 MG TOTAL) BY MOUTH DAILY WITH BREAKFAST.  Marland Kitchen glucose blood (ACCU-CHEK AVIVA) test strip Use as instructed 4 x daily dx E11.65  . HUMULIN R U-500 KWIKPEN 500 UNIT/ML kwikpen INJECT 70 UNITS INTO THE SKIN 3 TIMES DAILY WITH MEALS.  . metFORMIN (GLUCOPHAGE) 1000 MG tablet TAKE ONE TABLET BY MOUTH TWICE A DAY WITH A MEAL  . metoprolol tartrate (LOPRESSOR) 50 MG tablet TAKE ONE TABLET (50MG  TOTAL) BY MOUTH TWO TIMES DAILY  . omeprazole (PRILOSEC) 20 MG capsule TAKE ONE CAPSULE BY MOUTH DAILY  . rosuvastatin (CRESTOR) 20 MG tablet TAKE ONE (1) TABLET BY MOUTH EVERY DAY  . spironolactone (ALDACTONE) 25 MG tablet TAKE ONE  (1) TABLET BY MOUTH EVERY DAY  . UNIFINE PENTIPS 31G X 8 MM MISC USE FOR INSULIN INJECTIONS THREE TIMES DAILY.  Marland Kitchen Vitamin D, Ergocalciferol, (DRISDOL) 1.25 MG (50000 UNIT) CAPS capsule Take 1 capsule (50,000 Units total) by mouth every 7 (seven) days.  . Continuous Blood Gluc Sensor (FREESTYLE LIBRE 14 DAY SENSOR) MISC Inject 1 each into the skin every 14 (fourteen) days. Use as directed. (Patient not taking: Reported on 04/11/2020)   No facility-administered encounter medications on file as of 04/11/2020.    ALLERGIES: Allergies  Allergen Reactions  . Haloperidol Lactate Anaphylaxis  . Lipitor [Atorvastatin Calcium] Other (See Comments)    Markedly elevated liver enzymes    VACCINATION STATUS: Immunization History  Administered Date(s) Administered  . Influenza,inj,Quad PF,6+ Mos 04/01/2015, 03/16/2016, 01/20/2017, 04/11/2018, 02/13/2019, 01/31/2020  . Moderna Sars-Covid-2 Vaccination 12/13/2019, 01/11/2020  . Pneumococcal Polysaccharide-23 05/13/2015    Diabetes She presents for her follow-up diabetic visit. She has type 2 diabetes mellitus. Onset time: He was diagnosed at approximate age of 64 years. Her disease course has been fluctuating. There are no hypoglycemic associated symptoms. Pertinent negatives for hypoglycemia include no confusion, headaches, pallor or seizures. Associated symptoms include fatigue and weight loss. Pertinent negatives for diabetes include no blurred vision, no chest pain, no polydipsia, no polyphagia and no polyuria. There are no hypoglycemic complications. Symptoms are improving. Diabetic complications include heart disease. Risk factors for coronary artery disease include diabetes mellitus, dyslipidemia, family history, hypertension, obesity, post-menopausal and sedentary lifestyle. Current diabetic treatment includes oral agent (dual therapy) and intensive insulin program. She is compliant with treatment some of the time. She is following a generally  unhealthy diet. When asked about meal planning, she reported none. She has had a previous visit with a dietitian. She never participates in exercise. Her home blood glucose trend is increasing steadily. Her breakfast blood glucose range is generally >200 mg/dl. Her bedtime blood glucose range is generally 110-130 mg/dl. (She presents today for her virtual visit with her logs to review.  Her A1c was not checked prior to todays visit due to it being too early, however her last A1c was 7.3% on 01/31/20.  She has a CGM but has not used it yet because she does not know how to apply the sensor.  She was supposed to be coming to the office for a nurse visit but has not done so.  She is not consistently monitoring her glucose  as requested.  She says she has run out of test strips on numerous occasions and has to pay for them out of pocket, therefore she misses some of her opportunities to inject insulin.  She also reports recent GI upset, feeling nauseous, decreased appetite, and diarrhea at times.  She denies any significant hypoglycemia (lowest reading in the 90s).) An ACE inhibitor/angiotensin II receptor blocker is not being taken. She does not see a podiatrist.Eye exam is not current.  Hyperlipidemia This is a chronic problem. The current episode started more than 1 year ago. The problem is controlled. Exacerbating diseases include diabetes and obesity. Factors aggravating her hyperlipidemia include fatty foods. Pertinent negatives include no chest pain, myalgias or shortness of breath. Current antihyperlipidemic treatment includes statins. The current treatment provides moderate improvement of lipids. Compliance problems include adherence to exercise and adherence to diet.  Risk factors for coronary artery disease include diabetes mellitus, dyslipidemia, hypertension, obesity, a sedentary lifestyle and post-menopausal.  Hypertension This is a chronic problem. The current episode started more than 1 year ago. The  problem has been gradually improving since onset. The problem is uncontrolled. Pertinent negatives include no blurred vision, chest pain, headaches, palpitations or shortness of breath. There are no associated agents to hypertension. Risk factors for coronary artery disease include dyslipidemia, diabetes mellitus, obesity, sedentary lifestyle and post-menopausal state. Past treatments include beta blockers and diuretics. The current treatment provides moderate improvement. Compliance problems include exercise and diet.    Review of systems  Constitutional: + Minimally fluctuating body weight,  current  There is no height or weight on file to calculate BMI. , + fatigue, no subjective hyperthermia, no subjective hypothermia, decreased appetite Eyes: no blurry vision, no xerophthalmia ENT: no sore throat, no nodules palpated in throat, no dysphagia/odynophagia, no hoarseness Cardiovascular: no Chest Pain, no Shortness of Breath, no palpitations, no leg swelling Respiratory: no cough, no shortness of breath Gastrointestinal: + intermittent Nausea and Diarrhea Musculoskeletal: no muscle/joint aches Skin: no rashes, no hyperemia Neurological: no tremors, no numbness, no tingling, no dizziness Psychiatric: no depression, no anxiety    Objective:    There were no vitals taken for this visit.  Wt Readings from Last 3 Encounters:  03/04/20 (!) 390 lb 12.8 oz (177.3 kg)  02/20/20 (!) 390 lb (176.9 kg)  01/31/20 (!) 390 lb (176.9 kg)     BP Readings from Last 3 Encounters:  03/04/20 138/64  02/20/20 140/76  01/31/20 (!) 141/76    Physical Exam- Telehealth- significantly limited due to nature of visit  Constitutional: There is no height or weight on file to calculate BMI. , not in acute distress, normal state of mind Respiratory: Adequate breathing efforts    CMP     Component Value Date/Time   NA 138 12/21/2019 1252   K 4.2 12/21/2019 1252   CL 102 12/21/2019 1252   CO2 26 12/21/2019  1252   GLUCOSE 148 (H) 12/21/2019 1252   BUN 9 12/21/2019 1252   CREATININE 0.71 12/21/2019 1252   CALCIUM 9.4 12/21/2019 1252   PROT 7.6 02/01/2020 1415   ALBUMIN 3.9 07/13/2016 1430   AST 40 (H) 02/01/2020 1415   ALT 33 (H) 02/01/2020 1415   ALKPHOS 61 07/13/2016 1430   BILITOT 0.6 02/01/2020 1415   GFRNONAA 77 02/13/2019 1207   GFRAA 90 02/13/2019 1207    Diabetic Labs (most recent): Lab Results  Component Value Date   HGBA1C 7.3 (A) 01/31/2020   HGBA1C 7.9 (A) 06/13/2019   HGBA1C 9.8 (  A) 03/20/2019    Lipid Panel     Component Value Date/Time   CHOL 133 02/01/2020 1415   TRIG 117 02/01/2020 1415   HDL 38 (L) 02/01/2020 1415   CHOLHDL 3.5 02/01/2020 1415   VLDL 23 07/13/2016 1430   LDLCALC 75 02/01/2020 1415      Lab Results  Component Value Date   TSH 0.69 02/13/2019   TSH 1.23 05/09/2018   TSH 0.95 07/13/2016   TSH 1.26 07/10/2015   TSH 1.382 11/17/2013   TSH 0.519 04/14/2012   TSH 1.525 11/07/2009      Lipid Panel     Component Value Date/Time   CHOL 133 02/01/2020 1415   TRIG 117 02/01/2020 1415   HDL 38 (L) 02/01/2020 1415   CHOLHDL 3.5 02/01/2020 1415   VLDL 23 07/13/2016 1430   LDLCALC 75 02/01/2020 1415     Assessment & Plan:   1) Uncontrolled type 2 diabetes mellitus with hyperglycemia ([place])  - Saesha D Gedney has currently uncontrolled symptomatic type 2 DM since 64 years of age. -She is now on insulin U500.    She presents today for her virtual visit with her logs to review.  Her A1c was not checked prior to todays visit due to it being too early, however her last A1c was 7.3% on 01/31/20.  She has a CGM but has not used it yet because she does not know how to apply the sensor.  She was supposed to be coming to the office for a nurse visit but has not done so.  She is not consistently monitoring her glucose as requested.  She says she has run out of test strips on numerous occasions and has to pay for them out of pocket, therefore she  misses some of her opportunities to inject insulin.  She also reports recent GI upset, feeling nauseous, decreased appetite, and diarrhea at times.  She denies any significant hypoglycemia (lowest reading in the 90s).  Her last weeks worth of readings are as follows: 12/20-191, 111, 91 12/23- 178 12/24- 203, x, 209 12/25- 159 12/26- 230, x, 123 12/27- 258, x, 106 12/28- 122 12/29- 244  -her diabetes is complicated by obesity/sedentary life and LUCYNA BALLEK remains at a high risk for more acute and chronic complications which include CAD, CVA, CKD, retinopathy, and neuropathy. These are all discussed in detail with the patient.  - Nutritional counseling repeated at each appointment due to patients tendency to fall back in to old habits.  - The patient admits there is a room for improvement in their diet and drink choices. -  Suggestion is made for the patient to avoid simple carbohydrates from their diet including Cakes, Sweet Desserts / Pastries, Ice Cream, Soda (diet and regular), Sweet Tea, Candies, Chips, Cookies, Sweet Pastries,  Store Bought Juices, Alcohol in Excess of  1-2 drinks a day, Artificial Sweeteners, Coffee Creamer, and "Sugar-free" Products. This will help patient to have stable blood glucose profile and potentially avoid unintended weight gain.   - I encouraged the patient to switch to  unprocessed or minimally processed complex starch and increased protein intake (animal or plant source), fruits, and vegetables.   - Patient is advised to stick to a routine mealtimes to eat 3 meals  a day and avoid unnecessary snacks ( to snack only to correct hypoglycemia).  - I have approached her with the following individualized plan to manage diabetes and patient agrees:   -There is a lack of engagement inhibiting  optimal management of her diabetes to target.  She has not been monitoring glucose as frequently as requested (sometimes only monitoring once daily).  -Given the lack  of data, will recommend she stay on same diabetes medication regimen of U500 70 units three times daily with meals if blood glucose levels above 90 and she is eating for safety purposes.  She is to also continue Metformin 1000 mg po twice daily with meals, and Glipizide 10 mg XL daily with breakfast.  -Encouraged patient to monitor blood glucose 4 times per day, before meals and at bedtime and notify the clinic if blood glucose levels are less than 70 or greater than 300 for 3 tests in a row. -She has the freestyle libre CGM, but has not yet made an appt with the nurse to learn how to use it.  She says she plans on coming to the office next week for instruction, yet again.  2) BP/HTN:  According to her previous visits, her blood pressure is controlled to target.  She does not have a way of monitoring blood pressure at home.  She is advised to continue Metoprolol 50 mg po twice daily and continue Spironolactone 25 mg po daily.  3) Lipids/HPL:  Her recent lipid panel from 12/21/19 shows controlled LDL of 75.  She is advised to Continue Crestor 20 mg po daily at bedtime.  Side effects and precautions discussed with her.  4) Vitamin D Deficiency: Her recent vitamin D level on 12/21/19 was 16.  She has completed replenishment with ergocalciferol and is now taking OTC Vitamin D3 5000 units daily as maintenance dose.  Will recheck vitamin D level prior to next visit.   5) Chronic Care/Health Maintenance: -she is on Statin medications and is encouraged to continue to follow up with Ophthalmology, Dentist,  Podiatrist at least yearly or according to recommendations, and advised to stay away from smoking. I have recommended yearly flu vaccine and pneumonia vaccination at least every 5 years; moderate intensity exercise for up to 150 minutes weekly; and  sleep for at least 7 hours a day.  - I advised patient to maintain close follow up with Fayrene Helper, MD for primary care needs.  I spent 30 minutes  dedicated to the care of this patient on the date of this encounter to include pre-visit review of records, face-to-face time with the patient, and post visit ordering of  testing.    Please refer to Patient Instructions for Blood Glucose Monitoring and Insulin/Medications Dosing Guide"  in media tab for additional information. Please  also refer to " Patient Self Inventory" in the Media  tab for reviewed elements of pertinent patient history.  Duane Boston participated in the discussions, expressed understanding, and voiced agreement with the above plans.  All questions were answered to her satisfaction. she is encouraged to contact clinic should she have any questions or concerns prior to her return visit.   Follow up plan: - Return in about 3 months (around 07/10/2020) for Diabetes follow up with A1c in office, Previsit labs, ABI next visit.  Rayetta Pigg, [facility] [address] [place], [place] [address] Phone: [phone] Fax: [phone]  04/11/2020, 1:13 PM

## 2020-04-25 ENCOUNTER — Ambulatory Visit (HOSPITAL_COMMUNITY): Payer: Medicare Other

## 2020-04-25 ENCOUNTER — Ambulatory Visit (HOSPITAL_COMMUNITY): Admission: RE | Admit: 2020-04-25 | Payer: Medicare Other | Source: Ambulatory Visit

## 2020-05-14 ENCOUNTER — Ambulatory Visit (HOSPITAL_COMMUNITY): Payer: Medicare Other

## 2020-05-14 ENCOUNTER — Encounter (HOSPITAL_COMMUNITY): Payer: Medicare Other

## 2020-05-22 ENCOUNTER — Ambulatory Visit: Payer: Medicare Other | Admitting: Podiatry

## 2020-06-04 ENCOUNTER — Other Ambulatory Visit: Payer: Self-pay

## 2020-06-04 ENCOUNTER — Ambulatory Visit (HOSPITAL_COMMUNITY)
Admission: RE | Admit: 2020-06-04 | Discharge: 2020-06-04 | Disposition: A | Discharge status: Home / Self Care | Payer: Medicare Other | Source: Ambulatory Visit | Attending: Family Medicine | Admitting: Family Medicine

## 2020-06-04 DIAGNOSIS — R928 Other abnormal and inconclusive findings on diagnostic imaging of breast: Secondary | ICD-10-CM

## 2020-06-04 DIAGNOSIS — R922 Inconclusive mammogram: Secondary | ICD-10-CM | POA: Diagnosis not present

## 2020-06-04 DIAGNOSIS — R921 Mammographic calcification found on diagnostic imaging of breast: Secondary | ICD-10-CM | POA: Diagnosis not present

## 2020-06-04 DIAGNOSIS — N6489 Other specified disorders of breast: Secondary | ICD-10-CM | POA: Diagnosis not present

## 2020-06-12 ENCOUNTER — Other Ambulatory Visit: Payer: Self-pay | Admitting: "Endocrinology

## 2020-06-13 ENCOUNTER — Other Ambulatory Visit: Payer: Self-pay

## 2020-06-13 DIAGNOSIS — E1165 Type 2 diabetes mellitus with hyperglycemia: Secondary | ICD-10-CM

## 2020-06-13 MED ORDER — HUMULIN R U-500 KWIKPEN 500 UNIT/ML ~~LOC~~ SOPN: PEN_INJECTOR | SUBCUTANEOUS | 2 refills | Status: DC

## 2020-07-08 DIAGNOSIS — E1165 Type 2 diabetes mellitus with hyperglycemia: Secondary | ICD-10-CM | POA: Diagnosis not present

## 2020-07-08 DIAGNOSIS — E559 Vitamin D deficiency, unspecified: Secondary | ICD-10-CM | POA: Diagnosis not present

## 2020-07-09 LAB — COMPREHENSIVE METABOLIC PANEL
ALT: 41 IU/L — ABNORMAL HIGH (ref 0–32)
AST: 37 IU/L (ref 0–40)
Albumin/Globulin Ratio: 1.3 (ref 1.2–2.2)
Albumin: 4.6 g/dL (ref 3.8–4.8)
Alkaline Phosphatase: 74 IU/L (ref 44–121)
BUN/Creatinine Ratio: 11 — ABNORMAL LOW (ref 12–28)
BUN: 9 mg/dL (ref 8–27)
Bilirubin Total: 0.4 mg/dL (ref 0.0–1.2)
CO2: 20 mmol/L (ref 20–29)
Calcium: 9.6 mg/dL (ref 8.7–10.3)
Chloride: 97 mmol/L (ref 96–106)
Creatinine, Ser: 0.84 mg/dL (ref 0.57–1.00)
Globulin, Total: 3.5 g/dL (ref 1.5–4.5)
Glucose: 147 mg/dL — ABNORMAL HIGH (ref 65–99)
Potassium: 4.5 mmol/L (ref 3.5–5.2)
Sodium: 139 mmol/L (ref 134–144)
Total Protein: 8.1 g/dL (ref 6.0–8.5)
eGFR: 78 mL/min/{1.73_m2} (ref >59)

## 2020-07-09 LAB — VITAMIN D 25 HYDROXY (VIT D DEFICIENCY, FRACTURES): Vit D, 25-Hydroxy: 33.1 ng/mL (ref 30.0–100.0)

## 2020-07-10 ENCOUNTER — Ambulatory Visit: Payer: Medicare Other | Admitting: Nurse Practitioner

## 2020-07-10 NOTE — Patient Instructions (Incomplete)

## 2020-07-15 ENCOUNTER — Encounter: Payer: Self-pay | Admitting: Nurse Practitioner

## 2020-07-15 ENCOUNTER — Ambulatory Visit (INDEPENDENT_AMBULATORY_CARE_PROVIDER_SITE_OTHER): Payer: Medicare Other | Admitting: Nurse Practitioner

## 2020-07-15 ENCOUNTER — Other Ambulatory Visit: Payer: Self-pay

## 2020-07-15 VITALS — BP 163/98 | HR 106 | Ht 70.0 in | Wt 393.0 lb

## 2020-07-15 DIAGNOSIS — E559 Vitamin D deficiency, unspecified: Secondary | ICD-10-CM

## 2020-07-15 DIAGNOSIS — E1165 Type 2 diabetes mellitus with hyperglycemia: Secondary | ICD-10-CM

## 2020-07-15 DIAGNOSIS — I10 Essential (primary) hypertension: Secondary | ICD-10-CM | POA: Diagnosis not present

## 2020-07-15 DIAGNOSIS — E782 Mixed hyperlipidemia: Secondary | ICD-10-CM

## 2020-07-15 LAB — POCT GLYCOSYLATED HEMOGLOBIN (HGB A1C): Hemoglobin A1C: 7 % — AB (ref 4.0–5.6)

## 2020-07-15 NOTE — Patient Instructions (Signed)

## 2020-07-15 NOTE — Progress Notes (Signed)
07/15/2020, 3:07 PM   Endocrinology Follow Up Visit    Subjective:    Patient ID: Tracey [name], female    DOB: 20-May-1955.  Tracey [name] is being engaged in follow-up  for management of currently uncontrolled symptomatic type 2 diabetes, hyperlipidemia, hypertension.  She is currently having cold-like symptoms, thus the reason for the virtual appointment.  PMD:   Fayrene Helper, MD.  Past Medical History:  Diagnosis Date  . Arthritis   . Diabetes mellitus without complication ([place])   . Diastolic dysfunction 12/6281   Grade 1  . GERD (gastroesophageal reflux disease)   . Hyperlipidemia   . Hyperlipidemia   . Hypertension 04/15/2012  . Morbid obesity ([place])   . PE (pulmonary embolism)   . Pulmonary emboli ([place]) 1999   post TAH  . Sleep apnea    Past Surgical History:  Procedure Laterality Date  . ABDOMINAL HYSTERECTOMY  1999   , totalfor fibroids  . CESAREAN SECTION    . CHOLECYSTECTOMY  1996  . COLONOSCOPY N/A 10/31/2015   Procedure: COLONOSCOPY;  Surgeon: Rogene Houston, MD;  Location: [facility];  Service: Endoscopy;  Laterality: N/A;  930   Social History   Socioeconomic History  . Marital status: Single    Spouse name: Not on file  . Number of children: Not on file  . Years of education: Not on file  . Highest education level: Not on file  Occupational History  . Not on file  Tobacco Use  . Smoking status: Never Smoker  . Smokeless tobacco: Never Used  Vaping Use  . Vaping Use: Never used  Substance and Sexual Activity  . Alcohol use: No    Alcohol/week: 0.0 standard drinks  . Drug use: No  . Sexual activity: Not Currently  Other Topics Concern  . Not on file  Social History Narrative  . Not on file   Social Determinants of Health   Financial Resource Strain: Not on file  Food Insecurity: Not on file  Transportation  Needs: Not on file  Physical Activity: Not on file  Stress: Not on file  Social Connections: Not on file   Outpatient Encounter Medications as of 07/15/2020  Medication Sig  . Accu-Chek Softclix Lancets lancets Use as instructed 2x daily dx E11.65  . acetaminophen (TYLENOL) 500 MG tablet Take 500 mg by mouth every 6 (six) hours as needed for moderate pain.  Marland Kitchen aspirin EC 81 MG tablet Take 1 tablet (81 mg total) by mouth daily.  . Cholecalciferol (VITAMIN D-3) 125 MCG (5000 UT) TABS Take 5,000 Units by mouth daily.  Marland Kitchen glipiZIDE (GLUCOTROL XL) 10 MG 24 hr tablet TAKE ONE TABLET (10 MG TOTAL) BY MOUTH DAILY WITH BREAKFAST.  Marland Kitchen glucose blood (ACCU-CHEK AVIVA) test strip Use as instructed 4 x daily dx E11.65  . insulin regular human CONCENTRATED (HUMULIN R U-500 KWIKPEN) 500 UNIT/ML kwikpen INJECT 70 UNITS INTO THE SKIN 3 TIMES DAILY  WITH MEALS.  . metFORMIN (GLUCOPHAGE) 1000 MG tablet TAKE ONE TABLET BY MOUTH TWICE A DAY WITH A MEAL  . metoprolol tartrate (LOPRESSOR) 50 MG tablet TAKE ONE TABLET (50MG  TOTAL) BY MOUTH TWO TIMES DAILY  . omeprazole (PRILOSEC) 20 MG capsule TAKE ONE CAPSULE BY MOUTH DAILY  . rosuvastatin (CRESTOR) 20 MG tablet TAKE ONE (1) TABLET BY MOUTH EVERY DAY  . spironolactone (ALDACTONE) 25 MG tablet TAKE ONE (1) TABLET BY MOUTH EVERY DAY  . UNIFINE PENTIPS 31G X 8 MM MISC USE FOR INSULIN INJECTIONS THREE TIMES DAILY.  Marland Kitchen Vitamin D, Ergocalciferol, (DRISDOL) 1.25 MG (50000 UNIT) CAPS capsule Take 1 capsule (50,000 Units total) by mouth every 7 (seven) days.  . [DISCONTINUED] Continuous Blood Gluc Sensor (FREESTYLE LIBRE 14 DAY SENSOR) MISC Inject 1 each into the skin every 14 (fourteen) days. Use as directed. (Patient not taking: Reported on 04/11/2020)   No facility-administered encounter medications on file as of 07/15/2020.    ALLERGIES: Allergies  Allergen Reactions  . Haloperidol Lactate Anaphylaxis  . Lipitor [Atorvastatin Calcium] Other (See Comments)    Markedly  elevated liver enzymes    VACCINATION STATUS: Immunization History  Administered Date(s) Administered  . Influenza,inj,Quad PF,6+ Mos 04/01/2015, 03/16/2016, 01/20/2017, 04/11/2018, 02/13/2019, 01/31/2020  . Moderna Sars-Covid-2 Vaccination 12/13/2019, 01/11/2020  . Pneumococcal Polysaccharide-23 05/13/2015    Diabetes She presents for her follow-up diabetic visit. She has type 2 diabetes mellitus. Onset time: He was diagnosed at approximate age of 82 years. Her disease course has been improving. There are no hypoglycemic associated symptoms. Pertinent negatives for hypoglycemia include no confusion, headaches, pallor or seizures. Associated symptoms include fatigue. Pertinent negatives for diabetes include no blurred vision, no chest pain, no polydipsia, no polyphagia, no polyuria and no weight loss. There are no hypoglycemic complications. Symptoms are stable. Diabetic complications include heart disease and nephropathy. Risk factors for coronary artery disease include diabetes mellitus, dyslipidemia, family history, hypertension, obesity, post-menopausal and sedentary lifestyle. Current diabetic treatment includes intensive insulin program and oral agent (dual therapy). She is compliant with treatment most of the time. Her weight is stable. She is following a generally unhealthy diet. When asked about meal planning, she reported none. She has had a previous visit with a dietitian. She never participates in exercise. (She presents today with her meter and logs, showing inconsistent monitoring pattern.  She has not checked her glucose since 2/18.  Her POCT A1c today is 7%, improving from last visit of 7.3%.  She has been taking her insulin without first monitoring glucose.  She says she has run out of strips multiple times which has prevented her from checking her glucose routinely.  She has a CGM ordered and waiting on her at home to start.  She has not yet been by to have the nurse help her apply it.   She denies any significant hypoglycemia.) An ACE inhibitor/angiotensin II receptor blocker is not being taken. She does not see a podiatrist.Eye exam is not current.  Hyperlipidemia This is a chronic problem. The current episode started more than 1 year ago. The problem is controlled. Recent lipid tests were reviewed and are normal. Exacerbating diseases include chronic renal disease, diabetes and obesity. Factors aggravating her hyperlipidemia include fatty foods. Pertinent negatives include no chest pain, myalgias or shortness of breath. Current antihyperlipidemic treatment includes statins. The current treatment provides moderate improvement of lipids. Compliance problems include adherence to diet and adherence to exercise.  Risk factors for coronary artery disease include diabetes mellitus, dyslipidemia,  hypertension, obesity, a sedentary lifestyle and post-menopausal.  Hypertension This is a chronic problem. The current episode started more than 1 year ago. The problem has been gradually improving since onset. The problem is uncontrolled. Pertinent negatives include no blurred vision, chest pain, headaches, palpitations or shortness of breath. There are no associated agents to hypertension. Risk factors for coronary artery disease include dyslipidemia, diabetes mellitus, obesity, sedentary lifestyle and post-menopausal state. Past treatments include beta blockers and diuretics. The current treatment provides moderate improvement. Compliance problems include exercise and diet.  Hypertensive end-organ damage includes kidney disease. Identifiable causes of hypertension include chronic renal disease.     Review of systems  Constitutional: + Minimally fluctuating body weight,  current Body mass index is 56.39 kg/m. , + fatigue, no subjective hyperthermia, no subjective hypothermia Eyes: no blurry vision, no xerophthalmia ENT: no sore throat, no nodules palpated in throat, no dysphagia/odynophagia, no  hoarseness Cardiovascular: no chest pain, + shortness of breath with activity, no palpitations, no leg swelling Respiratory: no cough, no shortness of breath Gastrointestinal: no nausea/vomiting/diarrhea Musculoskeletal: no muscle/joint aches Skin: no rashes, no hyperemia Neurological: no tremors, no numbness, no tingling, no dizziness Psychiatric: no depression, no anxiety    Objective:    BP (!) 163/98   Pulse (!) 106   Ht 5\' 10"  (1.778 m)   Wt (!) 393 lb (178.3 kg)   BMI 56.39 kg/m   Wt Readings from Last 3 Encounters:  07/15/20 (!) 393 lb (178.3 kg)  03/04/20 (!) 390 lb 12.8 oz (177.3 kg)  02/20/20 (!) 390 lb (176.9 kg)     BP Readings from Last 3 Encounters:  07/15/20 (!) 163/98  03/04/20 138/64  02/20/20 140/76     Physical Exam- Limited  Constitutional:  Body mass index is 56.39 kg/m. , not in acute distress, normal state of mind Eyes:  EOMI, no exophthalmos Neck: Supple Cardiovascular: RRR, no murmers, rubs, or gallops, no edema Respiratory: Adequate breathing efforts, no crackles, rales, rhonchi, or wheezing Musculoskeletal: no gross deformities, strength intact in all four extremities, no gross restriction of joint movements Skin:  no rashes, no hyperemia Neurological: no tremor with outstretched hands    CMP     Component Value Date/Time   NA 139 07/08/2020 1151   K 4.5 07/08/2020 1151   CL 97 07/08/2020 1151   CO2 20 07/08/2020 1151   GLUCOSE 147 (H) 07/08/2020 1151   GLUCOSE 148 (H) 12/21/2019 1252   BUN 9 07/08/2020 1151   CREATININE 0.84 07/08/2020 1151   CREATININE 0.71 12/21/2019 1252   CALCIUM 9.6 07/08/2020 1151   PROT 8.1 07/08/2020 1151   ALBUMIN 4.6 07/08/2020 1151   AST 37 07/08/2020 1151   ALT 41 (H) 07/08/2020 1151   ALKPHOS 74 07/08/2020 1151   BILITOT 0.4 07/08/2020 1151   GFRNONAA 77 02/13/2019 1207   GFRAA 90 02/13/2019 1207    Diabetic Labs (most recent): Lab Results  Component Value Date   HGBA1C 7.0 (A) 07/15/2020    HGBA1C 7.3 (A) 01/31/2020   HGBA1C 7.9 (A) 06/13/2019    Lipid Panel     Component Value Date/Time   CHOL 133 02/01/2020 1415   TRIG 117 02/01/2020 1415   HDL 38 (L) 02/01/2020 1415   CHOLHDL 3.5 02/01/2020 1415   VLDL 23 07/13/2016 1430   LDLCALC 75 02/01/2020 1415      Lab Results  Component Value Date   TSH 0.69 02/13/2019   TSH 1.23 05/09/2018   TSH 0.95 07/13/2016  TSH 1.26 07/10/2015   TSH 1.382 11/17/2013   TSH 0.519 04/14/2012   TSH 1.525 11/07/2009      Lipid Panel     Component Value Date/Time   CHOL 133 02/01/2020 1415   TRIG 117 02/01/2020 1415   HDL 38 (L) 02/01/2020 1415   CHOLHDL 3.5 02/01/2020 1415   VLDL 23 07/13/2016 1430   [place] 75 02/01/2020 1415     Assessment & Plan:   1) Uncontrolled type 2 diabetes mellitus with hyperglycemia ([place])  - Tracey [name] has currently uncontrolled symptomatic type 2 DM since 65 years of age. -She is now on insulin U500.    She presents today with her meter and logs, showing inconsistent monitoring pattern.  She has not checked her glucose since 2/18.  Her POCT A1c today is 7%, improving from last visit of 7.3%.  She has been taking her insulin without first monitoring glucose.  She says she has run out of strips multiple times which has prevented her from checking her glucose routinely.  She has a CGM ordered and waiting on her at home to start.  She has not yet been by to have the nurse help her apply it.  She denies any significant hypoglycemia.  -her diabetes is complicated by obesity/sedentary life and Tracey [name] remains at a high risk for more acute and chronic complications which include CAD, CVA, CKD, retinopathy, and neuropathy. These are all discussed in detail with the patient.  - Nutritional counseling repeated at each appointment due to patients tendency to fall back in to old habits.  - The patient admits there is a room for improvement in their diet and drink choices. -   Suggestion is made for the patient to avoid simple carbohydrates from their diet including Cakes, Sweet Desserts / Pastries, Ice Cream, Soda (diet and regular), Sweet Tea, Candies, Chips, Cookies, Sweet Pastries,  Store Bought Juices, Alcohol in Excess of  1-2 drinks a day, Artificial Sweeteners, Coffee Creamer, and "Sugar-free" Products. This will help patient to have stable blood glucose profile and potentially avoid unintended weight gain.   - I encouraged the patient to switch to  unprocessed or minimally processed complex starch and increased protein intake (animal or plant source), fruits, and vegetables.   - Patient is advised to stick to a routine mealtimes to eat 3 meals  a day and avoid unnecessary snacks ( to snack only to correct hypoglycemia).  - I have approached her with the following individualized plan to manage diabetes and patient agrees:   -There is a lack of engagement inhibiting optimal management of her diabetes to target.  She continues to have inconsistent monitoring pattern and has not checked her glucose since 2/18 according to her meter.  -Given the lack of data and improved glycemic profile, will recommend she stay on same diabetes medication regimen of U-500 70 units three times daily with meals if blood glucose levels above 90 and she is eating for safety purposes.  She is to also continue Metformin 1000 mg po twice daily with meals, and Glipizide 10 mg XL daily with breakfast.  -Encouraged patient to monitor blood glucose 4 times per day, before meals and at bedtime and notify the clinic if blood glucose levels are less than 70 or greater than 300 for 3 tests in a row.  She is encouraged to move forward with the CGM application and monitoring.  -She is also advised not to inject insulin without proper glucose monitoring.  2) BP/HTN:  Her blood pressure is not controlled to target.  She has not taken all of her BP meds yet.  She waited on her Spironolactone 25 mg  until she gets home so she won't have to urinate as often.  She is advised to continue Metoprolol 50 mg po twice daily and continue Spironolactone 25 mg po daily.  If BP remains elevated on subsequent visits, she will benefit from addition of ACE/ARB for kidney protection.  3) Lipids/HPL:  Her recent lipid panel from 12/21/19 shows controlled LDL of 75.  She is advised to Continue Crestor 20 mg po daily at bedtime.  Side effects and precautions discussed with her.  4) Vitamin D Deficiency: Her recent vitamin D level on 07/08/20 was 33.1.  She has completed replenishment with ergocalciferol and is now taking OTC Vitamin D3 5000 units daily as maintenance dose.  She is advised to continue for now.    5) Chronic Care/Health Maintenance: -she is on Statin medications and is encouraged to continue to follow up with Ophthalmology, Dentist,  Podiatrist at least yearly or according to recommendations, and advised to stay away from smoking. I have recommended yearly flu vaccine and pneumonia vaccination at least every 5 years; moderate intensity exercise for up to 150 minutes weekly; and  sleep for at least 7 hours a day.  - I advised patient to maintain close follow up with Fayrene Helper, MD for primary care needs.  - Time spent on this patient care encounter:  40 min, of which > 50% was spent in  counseling and the rest reviewing her blood glucose logs , discussing her hypoglycemia and hyperglycemia episodes, reviewing her current and  previous labs / studies  ( including abstraction from other facilities) and medications  doses and developing a  long term treatment plan and documenting her care.   Please refer to Patient Instructions for Blood Glucose Monitoring and Insulin/Medications Dosing Guide"  in media tab for additional information. Please  also refer to " Patient Self Inventory" in the Media  tab for reviewed elements of pertinent patient history.  Tracey [name] participated in the  discussions, expressed understanding, and voiced agreement with the above plans.  All questions were answered to her satisfaction. she is encouraged to contact clinic should she have any questions or concerns prior to her return visit.   Follow up plan: - Return in about 3 months (around 10/14/2020) for Diabetes follow up with A1c in office, No previsit labs, Bring glucometer and logs.  Tracey [name], [facility] [address] [place], [place] [address] Phone: [phone] Fax: [phone]  07/15/2020, 3:07 PM

## 2020-09-02 ENCOUNTER — Ambulatory Visit: Payer: Medicare Other | Admitting: Internal Medicine

## 2020-09-02 ENCOUNTER — Ambulatory Visit: Payer: Medicare Other | Admitting: Podiatry

## 2020-09-04 ENCOUNTER — Ambulatory Visit: Payer: Medicare Other | Admitting: Podiatry

## 2020-09-25 ENCOUNTER — Ambulatory Visit: Payer: Medicare Other | Admitting: Podiatry

## 2020-10-03 NOTE — Progress Notes (Deleted)
Cardiology Office Note   Date:  10/03/2020   ID:  Calina, Patrie 1956-01-21, MRN [id number]  PCP:  Fayrene Helper, MD  Cardiologist:   [name], MD   Pt presents for f/u of HTN and HL and CP     History of Present Illness: Tracey [name] is a 65 y.o. female with a history of CP, DM, HTN, HL and morbid obesity.  Dobutamine echo in 2014 normal   Echo in 2020 LVEF normal   I last saw her  As a televisit in Feb 2021   Since seen she has been doing OK  She has rare fleeting CP She gets SOB with stairs (actually dizzy, heart races)   Has to climb 15 to her BR which is upstairs.     She has fallen occasionally  Seen by Dr Moshe Cipro in Oct   Labs :   LDL 75   AST/ALT miniamlly elevated   Note fatty liver on CT a fw years ago  PT skips breakfast   Lunch  Sandwich  And sweet tea.  Dinner  WIll have some sweet tea/lemonade         No outpatient medications have been marked as taking for the 10/04/20 encounter (Appointment) with Fay Records, MD.     Allergies:   Haloperidol lactate and Lipitor [atorvastatin calcium]   Past Medical History:  Diagnosis Date   Arthritis    Diabetes mellitus without complication ([place])    Diastolic dysfunction 04/7791   Grade 1   GERD (gastroesophageal reflux disease)    Hyperlipidemia    Hyperlipidemia    Hypertension 04/15/2012   Morbid obesity ([place])    PE (pulmonary embolism)    Pulmonary emboli ([place]) 1999   post TAH   Sleep apnea     Past Surgical History:  Procedure Laterality Date   ABDOMINAL HYSTERECTOMY  1999   , totalfor fibroids   [place]   COLONOSCOPY N/A 10/31/2015   Procedure: COLONOSCOPY;  Surgeon: Rogene Houston, MD;  Location[facility];  Service: Endoscopy;  Laterality: N/A;  930     Social History:  The patient  reports that she has never smoked. She has never used smokeless tobacco. She reports that she does not drink alcohol and does not use drugs.   Family  History:  The patient's family history includes Alcohol abuse in her father; Aneurysm in her brother; Arthritis in her sister; Cirrhosis in her father; Coronary artery disease in her sister; Depression in her mother and son; Diabetes in her mother; Heart attack (age of onset: 86) in her sister; Heart murmur in her son; Hyperlipidemia in her father; Hypertension in her father, mother, and son; Sleep apnea in her sister.    ROS:  Please see the history of present illness. All other systems are reviewed and  Negative to the above problem except as noted.    PHYSICAL EXAM: VS:  There were no vitals taken for this visit.  GEN: Morbidly obese 65 yo in no acute distress  HEENT: normal  Neck: no obvious JVD, carotid bruits, Cardiac: RRR; no murmurs,,np LE  edema  Respiratory:  clear to auscultation bilaterally, normal work of breathing GI: soft, Obese MS: no deformity Moving all extremities   Skin: warm and dry, no rash Neuro:  Strength and sensation are intact Psych: euthymic mood, full affect   EKG:  EKG is not ordered today.   Lipid Panel  Component Value Date/Time   CHOL 133 02/01/2020 1415   TRIG 117 02/01/2020 1415   HDL 38 (L) 02/01/2020 1415   CHOLHDL 3.5 02/01/2020 1415   VLDL 23 07/13/2016 1430   LDLCALC 75 02/01/2020 1415      Wt Readings from Last 3 Encounters:  07/15/20 (!) 393 lb (178.3 kg)  03/04/20 (!) 390 lb 12.8 oz (177.3 kg)  02/20/20 (!) 390 lb (176.9 kg)      ASSESSMENT AND PLAN:  1  Chest pain  Rare   Fleeting  I do not think cardiac   2  HTN BP is OK today   3  DM Last A1C was 7.3   Discussed sugar     4  HL  Recent lipid panel is pretty good   WIll elevation of LFTs though I would hold crestor for 1 month and recheck   If unchanged then I would resum   If normalized will look for another agent.   Most like the mild elevation is due to fatty liver and diet/wt.  We discussed   Unfort I do not think there is a bariatic clinci hear  5  Morbid obesity   INcrase activity   Will look into other options  Pt does not want surgery      Current medicines are reviewed at length with the patient today.  The patient does not have concerns regarding medicines.  Signed, [name], MD  10/03/2020 10:18 PM    [address], [place], [place]  [address] Phone: [phone]; Fax: [phone]

## 2020-10-04 ENCOUNTER — Ambulatory Visit: Payer: Medicare Other | Admitting: Internal Medicine

## 2020-10-07 ENCOUNTER — Other Ambulatory Visit: Payer: Self-pay | Admitting: Family Medicine

## 2020-10-16 NOTE — Progress Notes (Signed)
Cardiology Office Note   Date:  10/17/2020   ID:  Tracey [name], DOB 07-Mar-1956, MRN [id number]  PCP:  Fayrene Helper, MD  Cardiologist:   [name], MD   Pt presents for f/u of HTN and SOB      History of Present Illness: Tracey [name] is a 65 y.o. female with a history of CP, DM, HTN, PE  HL and morbid obesity.  Dobutamine echo in 2014 normal   Echo in 2020 LVEF normal     I saw the pt in clinic in Nov 2021  Since then she says her breathing has been stable   She still gets SOB with activity  Chronic, no change  She denies CP   No numbness in feet or hands   Notes some blood in urine   No dysuria  Cutting back on sugar drinks  Says her sugars are in 200s   Takes 70 U insulin tid       Current Meds  Medication Sig   Accu-Chek Softclix Lancets lancets Use as instructed 2x daily dx E11.65   acetaminophen (TYLENOL) 500 MG tablet Take 500 mg by mouth every 6 (six) hours as needed for moderate pain.   aspirin EC 81 MG tablet Take 1 tablet (81 mg total) by mouth daily.   Cholecalciferol (VITAMIN D-3) 125 MCG (5000 UT) TABS Take 5,000 Units by mouth daily.   glipiZIDE (GLUCOTROL XL) 10 MG 24 hr tablet TAKE ONE TABLET (10 MG TOTAL) BY MOUTH DAILY WITH BREAKFAST.   glucose blood (ACCU-CHEK AVIVA) test strip Use as instructed 4 x daily dx E11.65   insulin regular human CONCENTRATED (HUMULIN R U-500 KWIKPEN) 500 UNIT/ML kwikpen INJECT 70 UNITS INTO THE SKIN 3 TIMES DAILY WITH MEALS.   metFORMIN (GLUCOPHAGE) 1000 MG tablet TAKE ONE TABLET BY MOUTH TWICE A DAY WITH A MEAL   metoprolol tartrate (LOPRESSOR) 50 MG tablet TAKE ONE TABLET (50MG  TOTAL) BY MOUTH TWO TIMES DAILY   omeprazole (PRILOSEC) 20 MG capsule TAKE ONE CAPSULE BY MOUTH DAILY   spironolactone (ALDACTONE) 25 MG tablet TAKE ONE (1) TABLET BY MOUTH EVERY DAY   UNIFINE PENTIPS 31G X 8 MM MISC USE FOR INSULIN INJECTIONS THREE TIMES DAILY.   Vitamin D, Ergocalciferol, (DRISDOL) 1.25 MG (50000 UNIT) CAPS capsule  Take 1 capsule (50,000 Units total) by mouth every 7 (seven) days.     Allergies:   Haloperidol lactate and Lipitor [atorvastatin calcium]   Past Medical History:  Diagnosis Date   Arthritis    Diabetes mellitus without complication (HCC)    Diastolic dysfunction 12/9240   Grade 1   GERD (gastroesophageal reflux disease)    Hyperlipidemia    Hyperlipidemia    Hypertension 04/15/2012   Morbid obesity ([place])    PE (pulmonary embolism)    Pulmonary emboli ([place]) 1999   post TAH   Sleep apnea     Past Surgical History:  Procedure Laterality Date   ABDOMINAL HYSTERECTOMY  1999   , totalfor fibroids   CESAREAN SECTION     CHOLECYSTECTOMY  1996   COLONOSCOPY N/A 10/31/2015   Procedure: COLONOSCOPY;  Surgeon: Rogene Houston, MD;  [facility];  Service: Endoscopy;  Laterality: N/A;  930     Social History:  The patient  reports that she has never smoked. She has never used smokeless tobacco. She reports that she does not drink alcohol and does not use drugs.   Family History:  The patient's family history  includes Alcohol abuse in her father; Aneurysm in her brother; Arthritis in her sister; Cirrhosis in her father; Coronary artery disease in her sister; Depression in her mother and son; Diabetes in her mother; Heart attack (age of onset: 90) in her sister; Heart murmur in her son; Hyperlipidemia in her father; Hypertension in her father, mother, and son; Sleep apnea in her sister.    ROS:  Please see the history of present illness. All other systems are reviewed and  Negative to the above problem except as noted.    PHYSICAL EXAM: VS:  BP (!) 146/84   Pulse (!) 120   Ht 5\' 10"  (1.778 m)   Wt (!) 392 lb (177.8 kg)   SpO2 99%   BMI 56.25 kg/m   GEN: Morbidly obese 65 yo in no acute distress  HEENT: normal  Neck: no obvious JVD Cardiac: RRR; no murmurs,,Triv to 1+ LE  edema  Respiratory:  clear to auscultation bilaterally,  GI: soft, Obese MS: no deformity Moving  all extremities   Skin: warm and dry, no rash Neuro:  Strength and sensation are intact Psych: euthymic mood, full affect   EKG:  EKG is ordered today.  ST   122   Sl ST depression T wave inversion inferiorly    Lipid Panel    Component Value Date/Time   CHOL 133 02/01/2020 1415   TRIG 117 02/01/2020 1415   HDL 38 (L) 02/01/2020 1415   CHOLHDL 3.5 02/01/2020 1415   VLDL 23 07/13/2016 1430   LDLCALC 75 02/01/2020 1415      Wt Readings from Last 3 Encounters:  10/17/20 (!) 392 lb (177.8 kg)  07/15/20 (!) 393 lb (178.3 kg)  03/04/20 (!) 390 lb 12.8 oz (177.3 kg)      ASSESSMENT AND PLAN:  1  HTN   BP is fair   Was higher on 4/4/  WIll get labs    Would add to current regimen based on labs    2  Hx of CP   PT deneis     3  Tachycardia    Pt is tachycardic on arrival  Event getting onto exam table HR 110s   Denies symptoms   Will get CBC, TSH, CMET and UA    ? What is driving this HR     3  DM Last A1C was 7.3   Again, sugars could be a lot better   Should be ablve to back down on insulin    Pt has discussed bariatric surgery with others   Reluctant give previous Hx of PE postop  4.   HL     Will check lipids and LFTs   Because of mild LFT elevation last fall, crestor was held  She never restarted      5  Morbid obesity  Again discussed diet, carbs  A1C is 7   But, she is on very high doses of insulin       Current medicines are reviewed at length with the patient today.  The patient does not have concerns regarding medicines.  Signed, [name], MD  10/17/2020 8:32 AM    [address] [place] [place], [place], [place]  [address] Phone: [phone]; Fax: [phone]

## 2020-10-17 ENCOUNTER — Other Ambulatory Visit: Payer: Self-pay

## 2020-10-17 ENCOUNTER — Encounter (INDEPENDENT_AMBULATORY_CARE_PROVIDER_SITE_OTHER): Payer: Self-pay

## 2020-10-17 ENCOUNTER — Encounter: Payer: Self-pay | Admitting: Internal Medicine

## 2020-10-17 ENCOUNTER — Ambulatory Visit: Payer: Medicare Other | Admitting: Nurse Practitioner

## 2020-10-17 ENCOUNTER — Ambulatory Visit (INDEPENDENT_AMBULATORY_CARE_PROVIDER_SITE_OTHER): Payer: Medicare Other | Admitting: Internal Medicine

## 2020-10-17 VITALS — BP 146/84 | HR 120 | Ht 70.0 in | Wt 392.0 lb

## 2020-10-17 DIAGNOSIS — E782 Mixed hyperlipidemia: Secondary | ICD-10-CM | POA: Diagnosis not present

## 2020-10-17 DIAGNOSIS — E669 Obesity, unspecified: Secondary | ICD-10-CM | POA: Diagnosis not present

## 2020-10-17 DIAGNOSIS — Z79899 Other long term (current) drug therapy: Secondary | ICD-10-CM | POA: Diagnosis not present

## 2020-10-17 DIAGNOSIS — I10 Essential (primary) hypertension: Secondary | ICD-10-CM | POA: Diagnosis not present

## 2020-10-17 DIAGNOSIS — E1169 Type 2 diabetes mellitus with other specified complication: Secondary | ICD-10-CM | POA: Diagnosis not present

## 2020-10-17 DIAGNOSIS — R319 Hematuria, unspecified: Secondary | ICD-10-CM | POA: Diagnosis not present

## 2020-10-17 NOTE — Patient Instructions (Signed)
Medication Instructions:  Your physician recommends that you continue on your current medications as directed. Please refer to the Current Medication list given to you today.  *If you need a refill on your cardiac medications before your next appointment, please call your pharmacy*   Lab Work: Your physician recommends that you return for lab work in: Today   If you have labs (blood work) drawn today and your tests are completely normal, you will receive your results only by: MyChart Message (if you have MyChart) OR A paper copy in the mail If you have any lab test that is abnormal or we need to change your treatment, we will call you to review the results.   Testing/Procedures: NONE    Follow-Up: At [facility], you and your health needs are our priority.  As part of our continuing mission to provide you with exceptional heart care, we have created designated Provider Care Teams.  These Care Teams include your primary Cardiologist (physician) and Advanced Practice Providers (APPs -  Physician Assistants and Nurse Practitioners) who all work together to provide you with the care you need, when you need it.  We recommend signing up for the patient portal called "MyChart".  Sign up information is provided on this After Visit Summary.  MyChart is used to connect with patients for Virtual Visits (Telemedicine).  Patients are able to view lab/test results, encounter notes, upcoming appointments, etc.  Non-urgent messages can be sent to your provider as well.   To learn more about what you can do with MyChart, go to [email or web address].    Your next appointment:   6 week(s)  The format for your next appointment:   In Person  Provider:   [name], MD or Bernerd Pho, PA-C   Other Instructions Thank you for choosing [facility]!

## 2020-10-18 ENCOUNTER — Other Ambulatory Visit: Payer: Self-pay | Admitting: Family Medicine

## 2020-10-18 LAB — URINALYSIS
Bilirubin Urine: NEGATIVE
Glucose, UA: NEGATIVE
Hgb urine dipstick: NEGATIVE
Ketones, ur: NEGATIVE
Nitrite: NEGATIVE
Specific Gravity, Urine: 1.023 (ref 1.001–1.035)
pH: 5.5 (ref 5.0–8.0)

## 2020-10-21 ENCOUNTER — Telehealth: Payer: Self-pay

## 2020-10-21 DIAGNOSIS — E1169 Type 2 diabetes mellitus with other specified complication: Secondary | ICD-10-CM

## 2020-10-21 DIAGNOSIS — R319 Hematuria, unspecified: Secondary | ICD-10-CM

## 2020-10-21 DIAGNOSIS — E669 Obesity, unspecified: Secondary | ICD-10-CM

## 2020-10-21 NOTE — Telephone Encounter (Signed)
Pt notified and verbalized understanding. Orders added for Urine culture. Labs fwd to Dr. Moshe Cipro

## 2020-10-29 ENCOUNTER — Other Ambulatory Visit: Payer: Self-pay | Admitting: "Endocrinology

## 2020-10-30 NOTE — Patient Instructions (Signed)

## 2020-10-31 ENCOUNTER — Encounter: Payer: Self-pay | Admitting: Nurse Practitioner

## 2020-10-31 ENCOUNTER — Telehealth: Payer: Self-pay

## 2020-10-31 ENCOUNTER — Ambulatory Visit (INDEPENDENT_AMBULATORY_CARE_PROVIDER_SITE_OTHER): Payer: Medicare Other | Admitting: Nurse Practitioner

## 2020-10-31 VITALS — BP 145/82 | HR 116 | Ht 70.0 in | Wt 397.8 lb

## 2020-10-31 DIAGNOSIS — E559 Vitamin D deficiency, unspecified: Secondary | ICD-10-CM

## 2020-10-31 DIAGNOSIS — I10 Essential (primary) hypertension: Secondary | ICD-10-CM

## 2020-10-31 DIAGNOSIS — E782 Mixed hyperlipidemia: Secondary | ICD-10-CM

## 2020-10-31 DIAGNOSIS — E1165 Type 2 diabetes mellitus with hyperglycemia: Secondary | ICD-10-CM | POA: Diagnosis not present

## 2020-10-31 LAB — POCT GLYCOSYLATED HEMOGLOBIN (HGB A1C): Hemoglobin A1C: 8 % — AB (ref 4.0–5.6)

## 2020-10-31 MED ORDER — BLOOD GLUCOSE MONITOR KIT: PACK | 0 refills | Status: DC

## 2020-10-31 MED ORDER — BLOOD GLUCOSE MONITOR KIT: PACK | 0 refills | Status: AC

## 2020-10-31 NOTE — Telephone Encounter (Signed)
Tracey [name] called and said that they can not bill to medicare part D and that her RX for her meter and test supplies would need to go to either [facility] or [facility] so it can be billed to Medicare part B.

## 2020-10-31 NOTE — Telephone Encounter (Signed)
Pt made aware

## 2020-10-31 NOTE — Progress Notes (Signed)
10/31/2020, 10:42 AM   Endocrinology Follow Up Visit    Subjective:    Patient ID: Tracey [name], female    DOB: 03/11/1956.  Tracey [name] is being engaged in follow-up  for management of currently uncontrolled symptomatic type 2 diabetes, hyperlipidemia, hypertension.  She is currently having cold-like symptoms, thus the reason for the virtual appointment.  PMD:   Fayrene Helper, MD.  Past Medical History:  Diagnosis Date   Arthritis    Diabetes mellitus without complication ([place])    Diastolic dysfunction 04/6107   Grade 1   GERD (gastroesophageal reflux disease)    Hyperlipidemia    Hyperlipidemia    Hypertension 04/15/2012   Morbid obesity ([place])    PE (pulmonary embolism)    Pulmonary emboli ([place]) 1999   post TAH   Sleep apnea    Past Surgical History:  Procedure Laterality Date   ABDOMINAL HYSTERECTOMY  1999   , totalfor fibroids   CESAREAN SECTION     CHOLECYSTECTOMY  1996   COLONOSCOPY N/A 10/31/2015   Procedure: COLONOSCOPY;  Surgeon: Rogene Houston, MD;  [facility];  Service: Endoscopy;  Laterality: N/A;  930   Social History   Socioeconomic History   Marital status: Single    Spouse name: Not on file   Number of children: Not on file   Years of education: Not on file   Highest education level: Not on file  Occupational History   Not on file  Tobacco Use   Smoking status: Never   Smokeless tobacco: Never  Vaping Use   Vaping Use: Never used  Substance and Sexual Activity   Alcohol use: No    Alcohol/week: 0.0 standard drinks   Drug use: No   Sexual activity: Not Currently  Other Topics Concern   Not on file  Social History Narrative   Not on file   Social Determinants of Health   Financial Resource Strain: Not on file  Food Insecurity: Not on file  Transportation Needs: Not on file  Physical Activity:  Not on file  Stress: Not on file  Social Connections: Not on file   Outpatient Encounter Medications as of 10/31/2020  Medication Sig   blood glucose meter kit and supplies KIT Dispense based on patient and insurance preference. Use up to four times daily as directed.   Accu-Chek Softclix Lancets lancets Use as instructed 2x daily dx E11.65   acetaminophen (TYLENOL) 500 MG tablet Take 500 mg by mouth every 6 (six) hours as needed for moderate pain.   aspirin EC 81 MG tablet Take 1 tablet (81 mg total) by mouth daily.   Cholecalciferol (VITAMIN D-3) 125 MCG (5000 UT) TABS Take 5,000 Units by mouth daily.   glipiZIDE (GLUCOTROL XL) 10 MG 24 hr tablet TAKE ONE TABLET (10 MG TOTAL) BY MOUTH DAILY WITH BREAKFAST.   glucose blood (ACCU-CHEK AVIVA) test strip Use as instructed 4 x daily dx E11.65  insulin regular human CONCENTRATED (HUMULIN R U-500 KWIKPEN) 500 UNIT/ML kwikpen INJECT 70 UNITS INTO THE SKIN 3 TIMES DAILY WITH MEALS.   metFORMIN (GLUCOPHAGE) 1000 MG tablet TAKE ONE TABLET BY MOUTH TWICE A DAY WITH A MEAL   metoprolol tartrate (LOPRESSOR) 50 MG tablet TAKE ONE TABLET (50MG TOTAL) BY MOUTH TWO TIMES DAILY   omeprazole (PRILOSEC) 20 MG capsule TAKE ONE CAPSULE BY MOUTH DAILY   spironolactone (ALDACTONE) 25 MG tablet TAKE ONE (1) TABLET BY MOUTH EVERY DAY   UNIFINE PENTIPS 31G X 8 MM MISC USE FOR INSULIN INJECTIONS THREE TIMES DAILY.   Vitamin D, Ergocalciferol, (DRISDOL) 1.25 MG (50000 UNIT) CAPS capsule Take 1 capsule (50,000 Units total) by mouth every 7 (seven) days.   No facility-administered encounter medications on file as of 10/31/2020.    ALLERGIES: Allergies  Allergen Reactions   Haloperidol Lactate Anaphylaxis   Lipitor [Atorvastatin Calcium] Other (See Comments)    Markedly elevated liver enzymes    VACCINATION STATUS: Immunization History  Administered Date(s) Administered   Influenza,inj,Quad PF,6+ Mos 04/01/2015, 03/16/2016, 01/20/2017, 04/11/2018, 02/13/2019,  01/31/2020   Moderna Sars-Covid-2 Vaccination 12/13/2019, 01/11/2020   Pneumococcal Polysaccharide-23 05/13/2015    Diabetes She presents for her follow-up diabetic visit. She has type 2 diabetes mellitus. Onset time: He was diagnosed at approximate age of 64 years. Her disease course has been worsening. There are no hypoglycemic associated symptoms. Pertinent negatives for hypoglycemia include no confusion, headaches, pallor or seizures. Associated symptoms include fatigue. Pertinent negatives for diabetes include no blurred vision, no chest pain, no polydipsia, no polyphagia, no polyuria and no weight loss. There are no hypoglycemic complications. Symptoms are stable. Diabetic complications include heart disease and nephropathy. Risk factors for coronary artery disease include diabetes mellitus, dyslipidemia, family history, hypertension, obesity, post-menopausal and sedentary lifestyle. Current diabetic treatment includes intensive insulin program and oral agent (dual therapy). She is compliant with treatment most of the time. Her weight is fluctuating minimally. She is following a generally unhealthy diet. When asked about meal planning, she reported none. She has had a previous visit with a dietitian. She never participates in exercise. Her home blood glucose trend is fluctuating minimally. Her overall blood glucose range is 180-200 mg/dl. (She presents today with her meter and limited logs (still showing inconsistent glucose monitoring pattern).  Her glucometer had no readings in it for 90 days.  She does not have another meter.  Her POCT A1c today is 8%, increasing from last visit of 7%.  She denies any significant hypoglycemia.) An ACE inhibitor/angiotensin II receptor blocker is not being taken. She does not see a podiatrist.Eye exam is not current.  Hyperlipidemia This is a chronic problem. The current episode started more than 1 year ago. The problem is controlled. Recent lipid tests were reviewed  and are normal. Exacerbating diseases include chronic renal disease, diabetes and obesity. Factors aggravating her hyperlipidemia include fatty foods. Pertinent negatives include no chest pain, myalgias or shortness of breath. Current antihyperlipidemic treatment includes statins. The current treatment provides moderate improvement of lipids. Compliance problems include adherence to diet and adherence to exercise.  Risk factors for coronary artery disease include diabetes mellitus, dyslipidemia, hypertension, obesity, a sedentary lifestyle and post-menopausal.  Hypertension This is a chronic problem. The current episode started more than 1 year ago. The problem is unchanged. The problem is uncontrolled. Pertinent negatives include no blurred vision, chest pain, headaches, palpitations or shortness of breath. There are no associated agents to hypertension. Risk factors for coronary artery disease  include dyslipidemia, diabetes mellitus, obesity, sedentary lifestyle and post-menopausal state. Past treatments include beta blockers and diuretics. The current treatment provides moderate improvement. Compliance problems include exercise and diet.  Hypertensive end-organ damage includes kidney disease. Identifiable causes of hypertension include chronic renal disease.    Review of systems  Constitutional: + Minimally fluctuating body weight,  current Body mass index is 57.08 kg/m. , + fatigue, no subjective hyperthermia, no subjective hypothermia Eyes: no blurry vision, no xerophthalmia ENT: no sore throat, no nodules palpated in throat, no dysphagia/odynophagia, no hoarseness Cardiovascular: no chest pain, + shortness of breath with activity, no palpitations, no leg swelling Respiratory: no cough, no shortness of breath Gastrointestinal: no nausea/vomiting/diarrhea Musculoskeletal: no muscle/joint aches Skin: no rashes, no hyperemia Neurological: no tremors, no numbness, no tingling, no  dizziness Psychiatric: no depression, no anxiety    Objective:    BP (!) 145/82   Pulse (!) 116   Ht 5' 10"  (1.778 m)   Wt (!) 397 lb 12.8 oz (180.4 kg)   BMI 57.08 kg/m   Wt Readings from Last 3 Encounters:  10/31/20 (!) 397 lb 12.8 oz (180.4 kg)  10/17/20 (!) 392 lb (177.8 kg)  07/15/20 (!) 393 lb (178.3 kg)     BP Readings from Last 3 Encounters:  10/31/20 (!) 145/82  10/17/20 (!) 146/84  07/15/20 (!) 163/98      Physical Exam- Limited  Constitutional:  Body mass index is 57.08 kg/m. , not in acute distress, normal state of mind Eyes:  EOMI, no exophthalmos Neck: Supple Cardiovascular: RRR, no murmurs, rubs, or gallops, no edema Respiratory: Adequate breathing efforts, no crackles, rales, rhonchi, or wheezing Musculoskeletal: no gross deformities, strength intact in all four extremities, no gross restriction of joint movements Skin:  no rashes, no hyperemia Neurological: no tremor with outstretched hands    CMP     Component Value Date/Time   NA 139 07/08/2020 1151   K 4.5 07/08/2020 1151   CL 97 07/08/2020 1151   CO2 20 07/08/2020 1151   GLUCOSE 147 (H) 07/08/2020 1151   GLUCOSE 148 (H) 12/21/2019 1252   BUN 9 07/08/2020 1151   CREATININE 0.84 07/08/2020 1151   CREATININE 0.71 12/21/2019 1252   CALCIUM 9.6 07/08/2020 1151   PROT 8.1 07/08/2020 1151   ALBUMIN 4.6 07/08/2020 1151   AST 37 07/08/2020 1151   ALT 41 (H) 07/08/2020 1151   ALKPHOS 74 07/08/2020 1151   BILITOT 0.4 07/08/2020 1151   GFRNONAA 77 02/13/2019 1207   GFRAA 90 02/13/2019 1207    Diabetic Labs (most recent): Lab Results  Component Value Date   HGBA1C 7.0 (A) 07/15/2020   HGBA1C 7.3 (A) 01/31/2020   HGBA1C 7.9 (A) 06/13/2019    Lipid Panel     Component Value Date/Time   CHOL 133 02/01/2020 1415   TRIG 117 02/01/2020 1415   HDL 38 (L) 02/01/2020 1415   CHOLHDL 3.5 02/01/2020 1415   VLDL 23 07/13/2016 1430   LDLCALC 75 02/01/2020 1415      Lab Results  Component  Value Date   TSH 0.69 02/13/2019   TSH 1.23 05/09/2018   TSH 0.95 07/13/2016   TSH 1.26 07/10/2015   TSH 1.382 11/17/2013   TSH 0.519 04/14/2012   TSH 1.525 11/07/2009      Lipid Panel     Component Value Date/Time   CHOL 133 02/01/2020 1415   TRIG 117 02/01/2020 1415   HDL 38 (L) 02/01/2020 1415   CHOLHDL 3.5 02/01/2020 1415  VLDL 23 07/13/2016 1430   LDLCALC 75 02/01/2020 1415     Assessment & Plan:   1) Uncontrolled type 2 diabetes mellitus with hyperglycemia (HCC)  - Tracey [name] has currently uncontrolled symptomatic type 2 DM since 65 years of age. -She is now on insulin U500.    She presents today with her meter and limited logs (still showing inconsistent glucose monitoring pattern).  Her glucometer had no readings in it for 90 days.  She does not have another meter.  Her POCT A1c today is 8%, increasing from last visit of 7%.  She denies any significant hypoglycemia.  -her diabetes is complicated by obesity/sedentary life and Tracey [name] remains at a high risk for more acute and chronic complications which include CAD, CVA, CKD, retinopathy, and neuropathy. These are all discussed in detail with the patient.  - Nutritional counseling repeated at each appointment due to patients tendency to fall back in to old habits.  - The patient admits there is a room for improvement in their diet and drink choices. -  Suggestion is made for the patient to avoid simple carbohydrates from their diet including Cakes, Sweet Desserts / Pastries, Ice Cream, Soda (diet and regular), Sweet Tea, Candies, Chips, Cookies, Sweet Pastries, Store Bought Juices, Alcohol in Excess of 1-2 drinks a day, Artificial Sweeteners, Coffee Creamer, and "Sugar-free" Products. This will help patient to have stable blood glucose profile and potentially avoid unintended weight gain.   - I encouraged the patient to switch to unprocessed or minimally processed complex starch and increased protein  intake (animal or plant source), fruits, and vegetables.   - Patient is advised to stick to a routine mealtimes to eat 3 meals a day and avoid unnecessary snacks (to snack only to correct hypoglycemia).  - I have approached her with the following individualized plan to manage diabetes and patient agrees:   -There is a lack of engagement inhibiting optimal management of her diabetes to target.  She continues to have inconsistent monitoring pattern.  -Given the lack of data and improved glycemic profile, will recommend she stay on same diabetes medication regimen of U-500 70 units three times daily with meals if blood glucose levels above 90 and she is eating for safety purposes.  She is to also continue Metformin 1000 mg po twice daily with meals, and Glipizide 10 mg XL daily with breakfast.  -Encouraged patient to monitor blood glucose 4 times per day, before meals and at bedtime and notify the clinic if blood glucose levels are less than 70 or greater than 300 for 3 tests in a row.  I did send in script for generic meter to her pharmacy just in case there is a problem with her glucometer.  She is encouraged to move forward with the CGM application and monitoring and bring it with her to her follow up appointment in 2 weeks for help with application.  -She is also advised not to inject insulin without proper glucose monitoring.  2) BP/HTN:  Her blood pressure is still not controlled to target.  She has not yet taken her blood pressure medications.  She waited on her Spironolactone 25 mg until she gets home so she won't have to urinate as often.  She is advised to continue Metoprolol 50 mg po twice daily and continue Spironolactone 25 mg po daily.    3) Lipids/HPL:  Her recent lipid panel from 12/21/19 shows controlled LDL of 75.  She is advised to Continue Crestor 20  mg po daily at bedtime.  Side effects and precautions discussed with her.  Due for lipid recheck soon (ordered by PCP).  4) Vitamin D  Deficiency: Her recent vitamin D level on 07/08/20 was 33.1.  She has completed replenishment with ergocalciferol and is now taking OTC Vitamin D3 5000 units daily as maintenance dose.  She is advised to continue for now.    5) Chronic Care/Health Maintenance: -she is on Statin medications and is encouraged to continue to follow up with Ophthalmology, Dentist,  Podiatrist at least yearly or according to recommendations, and advised to stay away from smoking. I have recommended yearly flu vaccine and pneumonia vaccination at least every 5 years; moderate intensity exercise for up to 150 minutes weekly; and  sleep for at least 7 hours a day.  - I advised patient to maintain close follow up with Fayrene Helper, MD for primary care needs.    I spent 43 minutes in the care of the patient today including review of labs from [place], Lipids, Thyroid Function, Hematology (current and previous including abstractions from other facilities); face-to-face time discussing  her blood glucose readings/logs, discussing hypoglycemia and hyperglycemia episodes and symptoms, medications doses, her options of short and long term treatment based on the latest standards of care / guidelines;  discussion about incorporating lifestyle medicine;  and documenting the encounter.    Please refer to Patient Instructions for Blood Glucose Monitoring and Insulin/Medications Dosing Guide"  in media tab for additional information. Please  also refer to " Patient Self Inventory" in the Media  tab for reviewed elements of pertinent patient history.  Tracey [name] participated in the discussions, expressed understanding, and voiced agreement with the above plans.  All questions were answered to her satisfaction. she is encouraged to contact clinic should she have any questions or concerns prior to her return visit.   Follow up plan: - Return in about 2 weeks (around 11/14/2020) for Bring meter and logs, Diabetes F/U.  Tracey [name], [facility] [address] [place], [place] [address] Phone: [phone] Fax: [phone]  10/31/2020, 10:42 AM

## 2020-11-05 ENCOUNTER — Other Ambulatory Visit: Payer: Self-pay

## 2020-11-05 DIAGNOSIS — E1165 Type 2 diabetes mellitus with hyperglycemia: Secondary | ICD-10-CM

## 2020-11-05 MED ORDER — GLUCOSE BLOOD VI STRP: 1.0000 | ORAL_STRIP | Freq: Four times a day (QID) | 2 refills | Status: DC

## 2020-11-05 MED ORDER — LANCETS MISC: 1.0000 | Freq: Four times a day (QID) | 2 refills | Status: DC

## 2020-11-13 ENCOUNTER — Other Ambulatory Visit: Payer: Self-pay

## 2020-11-13 ENCOUNTER — Ambulatory Visit (INDEPENDENT_AMBULATORY_CARE_PROVIDER_SITE_OTHER): Payer: Medicare Other | Admitting: Podiatry

## 2020-11-13 ENCOUNTER — Encounter: Payer: Self-pay | Admitting: Podiatry

## 2020-11-13 DIAGNOSIS — L03039 Cellulitis of unspecified toe: Secondary | ICD-10-CM | POA: Diagnosis not present

## 2020-11-13 DIAGNOSIS — M79674 Pain in right toe(s): Secondary | ICD-10-CM | POA: Diagnosis not present

## 2020-11-13 DIAGNOSIS — M7989 Other specified soft tissue disorders: Secondary | ICD-10-CM

## 2020-11-13 DIAGNOSIS — B351 Tinea unguium: Secondary | ICD-10-CM

## 2020-11-13 DIAGNOSIS — M79675 Pain in left toe(s): Secondary | ICD-10-CM | POA: Diagnosis not present

## 2020-11-13 DIAGNOSIS — E1142 Type 2 diabetes mellitus with diabetic polyneuropathy: Secondary | ICD-10-CM

## 2020-11-13 MED ORDER — DOXYCYCLINE HYCLATE 100 MG PO TABS: 100.0000 mg | ORAL_TABLET | Freq: Two times a day (BID) | ORAL | 0 refills | Status: AC

## 2020-11-13 NOTE — Patient Instructions (Addendum)
EPSOM SALT FOOT SOAK INSTRUCTIONS  *IF YOU HAVE BEEN PRESCRIBED ANTIBIOTICS, TAKE AS INSTRUCTED UNTIL ALL ARE GONE*  Shopping List:  A. Plain epsom salt (not scented) B. Neosporin Cream or generic equivalent  C. 1-inch fabric band-aids   Place 1/4 cup of epsom salts in 2 quarts of warm tap water. IF YOU ARE DIABETIC, OR HAVE NEUROPATHY, CHECK THE TEMPERATURE OF THE WATER WITH YOUR ELBOW.   Submerge your foot/feet in the solution and soak for 10-15 minutes.      3.  Next, remove your foot/feet from solution, blot dry the affected area.    4.  Apply light amount of antibiotic cream and cover with fabric band-aid .  5.  This soak should be done once a day for 10 days.   6.  Monitor for any signs/symptoms of infection such as redness, swelling, odor, drainage, increased pain, or non-healing of digit.   7.  Please do not hesitate to call the office and speak to a Nurse or Doctor if you have questions.   8.  If you experience fever, chills, nightsweats, nausea or vomiting with worsening of digit, please go to the emergency room.

## 2020-11-14 ENCOUNTER — Ambulatory Visit: Payer: Medicare Other | Admitting: Nurse Practitioner

## 2020-11-14 DIAGNOSIS — E559 Vitamin D deficiency, unspecified: Secondary | ICD-10-CM

## 2020-11-14 DIAGNOSIS — E782 Mixed hyperlipidemia: Secondary | ICD-10-CM

## 2020-11-14 DIAGNOSIS — I10 Essential (primary) hypertension: Secondary | ICD-10-CM

## 2020-11-14 DIAGNOSIS — E1165 Type 2 diabetes mellitus with hyperglycemia: Secondary | ICD-10-CM

## 2020-11-17 NOTE — Progress Notes (Signed)
Subjective:  Patient ID: Tracey [name], female    DOB: 02/15/1956,  MRN: [id number]  65 y.o. female presents with at risk foot care with history of diabetic neuropathy and painful thick toenails that are difficult to trim. Pain interferes with ambulation. Aggravating factors include wearing enclosed shoe gear. Pain is relieved with periodic professional debridement.  Patient also has soft tissue mass on lateral aspect of her right foot which she states has grown in size. Patient states lesion would rub up against her shoe gear. At times, it was irritated and drained pus according to patient. She has kept it clean and dry. She has tried to protect it by covering it up when wearing shoe gear.   Patient's blood sugar was 162 mg/dl yesterday morning.  She is accompanied by her daughter and granddaughter on today's visit.  PCP: Fayrene Helper, MD and last visit was: 02/20/2020.  Review of Systems: Negative except as noted in the HPI.   Allergies  Allergen Reactions   Haloperidol Lactate Anaphylaxis   Lipitor [Atorvastatin Calcium] Other (See Comments)    Markedly elevated liver enzymes    Objective:  There were no vitals filed for this visit. Constitutional Patient is a pleasant 65 y.o. African American female morbidly obese in NAD. AAO x 3.  Vascular Capillary refill time to digits immediate b/l. Palpable DP pulse(s) b/l lower extremities Palpable PT pulse(s) b/l lower extremities Pedal hair sparse. Lower extremity skin temperature gradient within normal limits. No pain with calf compression b/l. Nonpitting edema noted b/l lower extremities. No cyanosis or clubbing noted.  Neurologic Normal speech. Protective sensation decreased with 10 gram monofilament b/l.  Dermatologic No open wounds b/l lower extremities. No interdigital macerations b/l lower extremities. Toenails 1-5 b/l elongated, discolored, dystrophic, thickened, crumbly with subungual debris and tenderness to dorsal  palpation. Incurvated nailplate medial border(s) R hallux.  Nail border hypertrophy present. There is tenderness to palpation. Sign(s) of infection: granulation tissue present medial border and serosanguinous drainage. The soft tissue mass noted on lateral aspect of her right foot has significantly increased in size. Remains flesh colored, but now protrudes and has a pedunculated base. Remains annular and measures 1.5 x 1.5 cm. No active bleeding nor drainage. No odor. Tender when manipulated.  Orthopedic: Normal muscle strength 5/5 to all lower extremity muscle groups bilaterally. No gross bony deformities b/l lower extremities.   Hemoglobin A1C Latest Ref Rng & Units 10/31/2020 07/15/2020 01/31/2020  HGBA1C 4.0 - 5.6 % 8.0(A) 7.0(A) 7.3(A)  Some recent data might be hidden   Assessment:   1. Pain due to onychomycosis of toenails of both feet   2. Paronychia of great toe   3. Mass of soft tissue of foot   4. Diabetic peripheral neuropathy associated with type 2 diabetes mellitus ([place])    Plan:  Patient was evaluated and treated and all questions answered.  Onychomycosis with pain -Nails palliatively debridement as below. -Educated on self-care  Procedure: Nail Debridement Rationale: Pain Type of Debridement: manual, sharp debridement. Instrumentation: Nail nipper, rotary burr. Number of Nails: 9  -Examined patient. -Patient to continue soft, supportive shoe gear daily. -Toenails 2-5 bilaterally and L hallux debrided in length and girth without iatrogenic bleeding with sterile nail nipper and dremel.  -Refer Ms.Kracht  to [facility] for further work up of her soft tissue mass of the right foot. -Offending nail border debrided and curretaged R hallux utilizing sterile nail nipper and currette. Border(s) cleansed with alcohol and triple antibiotic ointment applied. Dispensed  written instructions for once daily epsom salt soaks for 7 days. Follow up with Dr. Sherryle Lis for  reassessment. -Patient to report any pedal injuries to medical professional immediately. -Rx for Doxycyline 100 mg, #14, to be taken one capsule twice daily for 7 days. -Patient/POA to call should there be question/concern in the interim.  Return in about 3 months (around 02/13/2021).  Marzetta Board, DPM

## 2020-11-19 ENCOUNTER — Other Ambulatory Visit: Payer: Self-pay

## 2020-11-19 ENCOUNTER — Ambulatory Visit (INDEPENDENT_AMBULATORY_CARE_PROVIDER_SITE_OTHER): Payer: Medicare Other | Admitting: Podiatry

## 2020-11-19 DIAGNOSIS — E1169 Type 2 diabetes mellitus with other specified complication: Secondary | ICD-10-CM | POA: Diagnosis not present

## 2020-11-19 DIAGNOSIS — L989 Disorder of the skin and subcutaneous tissue, unspecified: Secondary | ICD-10-CM

## 2020-11-19 DIAGNOSIS — E669 Obesity, unspecified: Secondary | ICD-10-CM | POA: Diagnosis not present

## 2020-11-20 ENCOUNTER — Encounter: Payer: Self-pay | Admitting: Podiatry

## 2020-11-20 ENCOUNTER — Other Ambulatory Visit: Payer: Self-pay

## 2020-11-20 DIAGNOSIS — E1165 Type 2 diabetes mellitus with hyperglycemia: Secondary | ICD-10-CM

## 2020-11-20 MED ORDER — LANCETS MISC: 2 refills | Status: AC

## 2020-11-20 NOTE — Progress Notes (Signed)
  Subjective:  Patient ID: Tracey [name], female    DOB: Mar 04, 1956,  MRN: [id number]  Chief Complaint  Patient presents with   Ingrown Toenail    for evaluation of soft tissue mass and follow up of ingrown toenail right great toe    65 y.o. female presents with the above complaint. History confirmed with patient.  She is referred to me by Dr. Elisha Ponder for evaluation of a soft tissue mass on the outside of her ankle that is being increasing in size and starting to rub.  She also had a ingrown toenail was on doxycycline this is getting better  Objective:  Physical Exam: warm, good capillary refill, no trophic changes or ulcerative lesions, normal DP and PT pulses, and normal sensory exam. Right Foot: Right lateral ankle there is a benign-appearing skin lesion that does appear to be inflamed consistent with a dermatofibroma or skin tag  Assessment:   1. Benign skin lesion   2. Diabetes mellitus type 2 in obese [facility])      Plan:  Patient was evaluated and treated and all questions answered.  Ingrown toenail has resolved after doxycycline   I reviewed my clinical impression of the patient of the soft tissue mass which appears to be benign I think a simple surgical excision should suffice.  We discussed doing this in the operating room, her last A1c was slightly elevated from her baseline at 8.0%.  She is going back to see her diabetes doctor in a few weeks.  She will follow-up me in a month after she has been reevaluated from a glycemic standpoint because we discussed the risk of infection or postop complications due to this.  We will then plan for surgery at the end of September.  Return in about 4 weeks (around 12/17/2020) for plan for surgery end of Sept for mass .

## 2020-11-21 ENCOUNTER — Other Ambulatory Visit: Payer: Self-pay

## 2020-11-25 ENCOUNTER — Encounter: Payer: Self-pay | Admitting: Nurse Practitioner

## 2020-11-25 ENCOUNTER — Ambulatory Visit (INDEPENDENT_AMBULATORY_CARE_PROVIDER_SITE_OTHER): Payer: Medicare Other | Admitting: Nurse Practitioner

## 2020-11-25 VITALS — BP 148/74 | HR 109 | Ht 70.0 in | Wt 394.0 lb

## 2020-11-25 DIAGNOSIS — E559 Vitamin D deficiency, unspecified: Secondary | ICD-10-CM | POA: Diagnosis not present

## 2020-11-25 DIAGNOSIS — I10 Essential (primary) hypertension: Secondary | ICD-10-CM | POA: Diagnosis not present

## 2020-11-25 DIAGNOSIS — E782 Mixed hyperlipidemia: Secondary | ICD-10-CM

## 2020-11-25 DIAGNOSIS — E1165 Type 2 diabetes mellitus with hyperglycemia: Secondary | ICD-10-CM | POA: Diagnosis not present

## 2020-11-25 MED ORDER — HUMULIN R U-500 KWIKPEN 500 UNIT/ML ~~LOC~~ SOPN: PEN_INJECTOR | SUBCUTANEOUS | 2 refills | Status: DC

## 2020-11-25 NOTE — Patient Instructions (Signed)

## 2020-11-25 NOTE — Progress Notes (Signed)
11/25/2020, 2:48 PM   Endocrinology Follow Up Visit    Subjective:    Patient ID: Tracey [name], female    DOB: 12/19/1955.  Tracey [name] is being engaged in follow-up  for management of currently uncontrolled symptomatic type 2 diabetes, hyperlipidemia, hypertension.  She is currently having cold-like symptoms, thus the reason for the virtual appointment.  PMD:   Fayrene Helper, MD.  Past Medical History:  Diagnosis Date   Arthritis    Diabetes mellitus without complication ([place])    Diastolic dysfunction 05/9560   Grade 1   GERD (gastroesophageal reflux disease)    Hyperlipidemia    Hyperlipidemia    Hypertension 04/15/2012   Morbid obesity ([place])    PE (pulmonary embolism)    Pulmonary emboli ([place]) 1999   post TAH   Sleep apnea    Past Surgical History:  Procedure Laterality Date   ABDOMINAL HYSTERECTOMY  1999   , totalfor fibroids   CESAREAN SECTION     CHOLECYSTECTOMY  1996   COLONOSCOPY N/A 10/31/2015   Procedure: COLONOSCOPY;  Surgeon: Rogene Houston, MD;  Location: [facility];  Service: Endoscopy;  Laterality: N/A;  930   Social History   Socioeconomic History   Marital status: Single    Spouse name: Not on file   Number of children: Not on file   Years of education: Not on file   Highest education level: Not on file  Occupational History   Not on file  Tobacco Use   Smoking status: Never   Smokeless tobacco: Never  Vaping Use   Vaping Use: Never used  Substance and Sexual Activity   Alcohol use: No    Alcohol/week: 0.0 standard drinks   Drug use: No   Sexual activity: Not Currently  Other Topics Concern   Not on file  Social History Narrative   Not on file   Social Determinants of Health   Financial Resource Strain: Not on file  Food Insecurity: Not on file  Transportation Needs: Not on file  Physical Activity:  Not on file  Stress: Not on file  Social Connections: Not on file   Outpatient Encounter Medications as of 11/25/2020  Medication Sig   acetaminophen (TYLENOL) 500 MG tablet Take 500 mg by mouth every 6 (six) hours as needed for moderate pain.   aspirin EC 81 MG tablet Take 1 tablet (81 mg total) by mouth daily.   blood glucose meter kit and supplies KIT Dispense based on patient and insurance preference. Use up to four times daily as directed.   Cholecalciferol (VITAMIN D-3) 125 MCG (5000 UT) TABS Take 5,000 Units by mouth daily.   glipiZIDE (GLUCOTROL XL) 10 MG 24 hr tablet TAKE ONE TABLET (10 MG TOTAL) BY MOUTH DAILY WITH BREAKFAST.   glucose blood test strip 1 each by Other route 4 (four) times daily. Use as instructed   insulin regular human CONCENTRATED (HUMULIN R U-500 KWIKPEN) 500  UNIT/ML KwikPen INJECT 55 UNITS INTO THE SKIN 3 TIMES DAILY WITH MEALS.   Lancets MISC Use to test BG qid. Dx : E11.65. Softclix Lancets   metFORMIN (GLUCOPHAGE) 1000 MG tablet TAKE ONE TABLET BY MOUTH TWICE A DAY WITH A MEAL   metoprolol tartrate (LOPRESSOR) 50 MG tablet TAKE ONE TABLET (50MG TOTAL) BY MOUTH TWO TIMES DAILY   omeprazole (PRILOSEC) 20 MG capsule TAKE ONE CAPSULE BY MOUTH DAILY   spironolactone (ALDACTONE) 25 MG tablet TAKE ONE (1) TABLET BY MOUTH EVERY DAY   UNIFINE PENTIPS 31G X 8 MM MISC USE FOR INSULIN INJECTIONS THREE TIMES DAILY.   Vitamin D, Ergocalciferol, (DRISDOL) 1.25 MG (50000 UNIT) CAPS capsule Take 1 capsule (50,000 Units total) by mouth every 7 (seven) days.   [DISCONTINUED] insulin regular human CONCENTRATED (HUMULIN R U-500 KWIKPEN) 500 UNIT/ML kwikpen INJECT 70 UNITS INTO THE SKIN 3 TIMES DAILY WITH MEALS.   No facility-administered encounter medications on file as of 11/25/2020.    ALLERGIES: Allergies  Allergen Reactions   Haloperidol Lactate Anaphylaxis   Lipitor [Atorvastatin Calcium] Other (See Comments)    Markedly elevated liver enzymes    VACCINATION  STATUS: Immunization History  Administered Date(s) Administered   Influenza,inj,Quad PF,6+ Mos 04/01/2015, 03/16/2016, 01/20/2017, 04/11/2018, 02/13/2019, 01/31/2020   Moderna Sars-Covid-2 Vaccination 12/13/2019, 01/11/2020   Pneumococcal Polysaccharide-23 05/13/2015    Diabetes She presents for her follow-up diabetic visit. She has type 2 diabetes mellitus. Onset time: He was diagnosed at approximate age of 38 years. Her disease course has been improving. There are no hypoglycemic associated symptoms. Pertinent negatives for hypoglycemia include no confusion, headaches, pallor or seizures. Associated symptoms include fatigue. Pertinent negatives for diabetes include no blurred vision, no chest pain, no polydipsia, no polyphagia, no polyuria and no weight loss. There are no hypoglycemic complications. Symptoms are stable. Diabetic complications include heart disease and nephropathy. Risk factors for coronary artery disease include diabetes mellitus, dyslipidemia, family history, hypertension, obesity, post-menopausal and sedentary lifestyle. Current diabetic treatment includes intensive insulin program and oral agent (dual therapy). She is compliant with treatment most of the time. Her weight is fluctuating minimally. She is following a generally unhealthy diet. When asked about meal planning, she reported none. She has had a previous visit with a dietitian. She never participates in exercise. Her home blood glucose trend is decreasing steadily. Her breakfast blood glucose range is generally 70-90 mg/dl. Her lunch blood glucose range is generally 90-110 mg/dl. Her dinner blood glucose range is generally 90-110 mg/dl. (She presents today with her meters and logs showing improved glycemic profile with frequent episodes of hypoglycemia of which she has no symptoms.  She was not due for another A1c today.  Analysis of her meter shows 7-day average of 100 with 11 readings.) An ACE inhibitor/angiotensin II  receptor blocker is not being taken. She does not see a podiatrist.Eye exam is not current.  Hyperlipidemia This is a chronic problem. The current episode started more than 1 year ago. The problem is controlled. Recent lipid tests were reviewed and are normal. Exacerbating diseases include chronic renal disease, diabetes and obesity. Factors aggravating her hyperlipidemia include fatty foods. Pertinent negatives include no chest pain, myalgias or shortness of breath. Current antihyperlipidemic treatment includes statins. The current treatment provides moderate improvement of lipids. Compliance problems include adherence to diet and adherence to exercise.  Risk factors for coronary artery disease include diabetes mellitus, dyslipidemia, hypertension, obesity, a sedentary lifestyle and post-menopausal.  Hypertension This is a chronic problem. The current  episode started more than 1 year ago. The problem is unchanged. The problem is uncontrolled. Pertinent negatives include no blurred vision, chest pain, headaches, palpitations or shortness of breath. There are no associated agents to hypertension. Risk factors for coronary artery disease include dyslipidemia, diabetes mellitus, obesity, sedentary lifestyle and post-menopausal state. Past treatments include beta blockers and diuretics. The current treatment provides moderate improvement. Compliance problems include exercise and diet.  Hypertensive end-organ damage includes kidney disease. Identifiable causes of hypertension include chronic renal disease.    Review of systems  Constitutional: + Minimally fluctuating body weight,  current Body mass index is 56.53 kg/m. , + fatigue, no subjective hyperthermia, no subjective hypothermia Eyes: no blurry vision, no xerophthalmia ENT: no sore throat, no nodules palpated in throat, no dysphagia/odynophagia, no hoarseness Cardiovascular: no chest pain, + shortness of breath with activity, no palpitations, no leg  swelling Respiratory: no cough, no shortness of breath Gastrointestinal: no nausea/vomiting/diarrhea Musculoskeletal: no muscle/joint aches Skin: no rashes, no hyperemia, ? Skin lesion to left outer ankle (supposed to be having surgery to remove soon via podiatry) Neurological: no tremors, no numbness, no tingling, no dizziness Psychiatric: no depression, no anxiety    Objective:    BP (!) 148/74   Pulse (!) 109   Ht 5' 10"  (1.778 m)   Wt (!) 394 lb (178.7 kg)   BMI 56.53 kg/m   Wt Readings from Last 3 Encounters:  11/25/20 (!) 394 lb (178.7 kg)  10/31/20 (!) 397 lb 12.8 oz (180.4 kg)  10/17/20 (!) 392 lb (177.8 kg)     BP Readings from Last 3 Encounters:  11/25/20 (!) 148/74  10/31/20 (!) 145/82  10/17/20 (!) 146/84    Physical Exam- Limited  Constitutional:  Body mass index is 56.53 kg/m. , not in acute distress, normal state of mind Eyes:  EOMI, no exophthalmos Neck: Supple Cardiovascular: RRR, no murmurs, rubs, or gallops, no edema Respiratory: Adequate breathing efforts, no crackles, rales, rhonchi, or wheezing Musculoskeletal: no gross deformities, strength intact in all four extremities, no gross restriction of joint movements Skin:  no rashes, no hyperemia, + small (pea-sized) skin tag to right lateral ankle Neurological: no tremor with outstretched hands    CMP     Component Value Date/Time   NA 139 07/08/2020 1151   K 4.5 07/08/2020 1151   CL 97 07/08/2020 1151   CO2 20 07/08/2020 1151   GLUCOSE 147 (H) 07/08/2020 1151   GLUCOSE 148 (H) 12/21/2019 1252   BUN 9 07/08/2020 1151   CREATININE 0.84 07/08/2020 1151   CREATININE 0.71 12/21/2019 1252   CALCIUM 9.6 07/08/2020 1151   PROT 8.1 07/08/2020 1151   ALBUMIN 4.6 07/08/2020 1151   AST 37 07/08/2020 1151   ALT 41 (H) 07/08/2020 1151   ALKPHOS 74 07/08/2020 1151   BILITOT 0.4 07/08/2020 1151   GFRNONAA 77 02/13/2019 1207   GFRAA 90 02/13/2019 1207    Diabetic Labs (most recent): Lab Results   Component Value Date   HGBA1C 8.0 (A) 10/31/2020   HGBA1C 7.0 (A) 07/15/2020   HGBA1C 7.3 (A) 01/31/2020    Lipid Panel     Component Value Date/Time   CHOL 133 02/01/2020 1415   TRIG 117 02/01/2020 1415   HDL 38 (L) 02/01/2020 1415   CHOLHDL 3.5 02/01/2020 1415   VLDL 23 07/13/2016 1430   LDLCALC 75 02/01/2020 1415      Lab Results  Component Value Date   TSH 0.69 02/13/2019   TSH 1.23 05/09/2018  TSH 0.95 07/13/2016   TSH 1.26 07/10/2015   TSH 1.382 11/17/2013   TSH 0.519 04/14/2012   TSH 1.525 11/07/2009      Lipid Panel     Component Value Date/Time   CHOL 133 02/01/2020 1415   TRIG 117 02/01/2020 1415   HDL 38 (L) 02/01/2020 1415   CHOLHDL 3.5 02/01/2020 1415   VLDL 23 07/13/2016 1430   [place] 75 02/01/2020 1415     Assessment & Plan:   1) Uncontrolled type 2 diabetes mellitus with hyperglycemia (Tracey [name])  - Tracey [name] has currently uncontrolled symptomatic type 2 DM since 65 years of age. -She is now on insulin U500.    She presents today with her meters and logs showing improved glycemic profile with frequent episodes of hypoglycemia of which she has no symptoms.  She was not due for another A1c today.  Analysis of her meter shows 7-day average of 100 with 11 readings.  -her diabetes is complicated by obesity/sedentary life and Tracey [name] remains at a high risk for more acute and chronic complications which include CAD, CVA, CKD, retinopathy, and neuropathy. These are all discussed in detail with the patient.  - Nutritional counseling repeated at each appointment due to patients tendency to fall back in to old habits.  - The patient admits there is a room for improvement in their diet and drink choices. -  Suggestion is made for the patient to avoid simple carbohydrates from their diet including Cakes, Sweet Desserts / Pastries, Ice Cream, Soda (diet and regular), Sweet Tea, Candies, Chips, Cookies, Sweet Pastries, Store Bought Juices,  Alcohol in Excess of 1-2 drinks a day, Artificial Sweeteners, Coffee Creamer, and "Sugar-free" Products. This will help patient to have stable blood glucose profile and potentially avoid unintended weight gain.   - I encouraged the patient to switch to unprocessed or minimally processed complex starch and increased protein intake (animal or plant source), fruits, and vegetables.   - Patient is advised to stick to a routine mealtimes to eat 3 meals a day and avoid unnecessary snacks (to snack only to correct hypoglycemia).  - I have approached her with the following individualized plan to manage diabetes and patient agrees:   -There is a lack of engagement inhibiting optimal management of her diabetes to target.  She continues to have inconsistent monitoring pattern.  -Based on her frequency of hypoglycemia, she is advised to lower her dose of U-500 to 55 units TID with meals if glucose is above 90 and she is eating.  She is advised to continue   -Encouraged patient to monitor blood glucose 4 times per day, before meals and at bedtime and notify the clinic if blood glucose levels are less than 70 or greater than 300 for 3 tests in a row.   -She is also advised not to inject insulin without proper glucose monitoring.  2) BP/HTN:  Her blood pressure is still not controlled to target but is improving.  She typically waits to take her Spironolactone 25 mg until she gets home so she won't have to urinate as often.  She is advised to continue Metoprolol 50 mg po twice daily and continue Spironolactone 25 mg po daily.    3) Lipids/HPL:  Her recent lipid panel from 12/21/19 shows controlled LDL of 75.  She is advised to Continue Crestor 20 mg po daily at bedtime.  Side effects and precautions discussed with her.  Due for lipid recheck soon (ordered by PCP).  4)  Vitamin D Deficiency: Her recent vitamin D level on 07/08/20 was 33.1.  She has completed replenishment with ergocalciferol and is now taking OTC  Vitamin D3 5000 units daily as maintenance dose.  She is advised to continue for now.    5) Chronic Care/Health Maintenance: -she is on Statin medications and is encouraged to continue to follow up with Ophthalmology, Dentist,  Podiatrist at least yearly or according to recommendations, and advised to stay away from smoking. I have recommended yearly flu vaccine and pneumonia vaccination at least every 5 years; moderate intensity exercise for up to 150 minutes weekly; and  sleep for at least 7 hours a day.  - I advised patient to maintain close follow up with Fayrene Helper, MD for primary care needs.      I spent 30 minutes in the care of the patient today including review of labs from [place], Lipids, Thyroid Function, Hematology (current and previous including abstractions from other facilities); face-to-face time discussing  her blood glucose readings/logs, discussing hypoglycemia and hyperglycemia episodes and symptoms, medications doses, her options of short and long term treatment based on the latest standards of care / guidelines;  discussion about incorporating lifestyle medicine;  and documenting the encounter.    Please refer to Patient Instructions for Blood Glucose Monitoring and Insulin/Medications Dosing Guide"  in media tab for additional information. Please  also refer to " Patient Self Inventory" in the Media  tab for reviewed elements of pertinent patient history.  Duane Boston participated in the discussions, expressed understanding, and voiced agreement with the above plans.  All questions were answered to her satisfaction. she is encouraged to contact clinic should she have any questions or concerns prior to her return visit.   Follow up plan: - Return in about 10 weeks (around 02/03/2021) for Diabetes F/U with A1c in office, No previsit labs, Bring meter and logs.    Rayetta Pigg, [facility] [address] [place],  [place] [address] Phone: [phone] Fax: [phone]  11/25/2020, 2:48 PM

## 2020-11-26 ENCOUNTER — Other Ambulatory Visit: Payer: Self-pay | Admitting: Nurse Practitioner

## 2020-12-04 ENCOUNTER — Ambulatory Visit (INDEPENDENT_AMBULATORY_CARE_PROVIDER_SITE_OTHER): Payer: Medicare Other

## 2020-12-04 ENCOUNTER — Other Ambulatory Visit: Payer: Self-pay

## 2020-12-04 DIAGNOSIS — Z0000 Encounter for general adult medical examination without abnormal findings: Secondary | ICD-10-CM | POA: Diagnosis not present

## 2020-12-04 NOTE — Progress Notes (Signed)
Subjective:   Tracey [name] is a 65 y.o. female who presents for Medicare Annual (Subsequent) preventive examination. I connected with  Duane Boston on 12/04/20 by a audio enabled telemedicine application and verified that I am speaking with the correct person using two identifiers.   I discussed the limitations of evaluation and management by telemedicine. The patient expressed understanding and agreed to proceed.   Location of patient: Home  Location of Provider: Office   Persons participating in virtual visit: Tracey [name] (patient) and Valli Glance  Review of Systems    Defer to PCP       Objective:    There were no vitals filed for this visit. There is no height or weight on file to calculate BMI.  Advanced Directives 12/04/2020 11/22/2017 11/16/2016 10/22/2016 05/12/2016 02/10/2016 10/31/2015  Does Patient Have a Medical Advance Directive? _0  No No  Would patient like information on creating a medical advance directive? - Yes (ED - Information included in AVS) Yes (MAU/Ambulatory/Procedural Areas - Information given) - - - No - patient declined information    Current Medications (verified) Outpatient Encounter Medications as of 12/04/2020  Medication Sig   acetaminophen (TYLENOL) 500 MG tablet Take 500 mg by mouth every 6 (six) hours as needed for moderate pain.   aspirin EC 81 MG tablet Take 1 tablet (81 mg total) by mouth daily.   blood glucose meter kit and supplies KIT Dispense based on patient and insurance preference. Use up to four times daily as directed.   Cholecalciferol (VITAMIN D-3) 125 MCG (5000 UT) TABS Take 5,000 Units by mouth daily.   glipiZIDE (GLUCOTROL XL) 10 MG 24 hr tablet TAKE ONE TABLET (10MG TOTAL) BY MOUTH DAILY WITH BREAKFAST   glucose blood test strip 1 each by Other route 4 (four) times daily. Use as instructed   insulin regular human CONCENTRATED (HUMULIN R U-500 KWIKPEN) 500 UNIT/ML KwikPen INJECT 55 UNITS INTO THE SKIN 3  TIMES DAILY WITH MEALS.   Lancets MISC Use to test BG qid. Dx : E11.65. Softclix Lancets   metFORMIN (GLUCOPHAGE) 1000 MG tablet TAKE ONE TABLET BY MOUTH TWICE A DAY WITH A MEAL   metoprolol tartrate (LOPRESSOR) 50 MG tablet TAKE ONE TABLET (50MG TOTAL) BY MOUTH TWO TIMES DAILY   omeprazole (PRILOSEC) 20 MG capsule TAKE ONE CAPSULE BY MOUTH DAILY   spironolactone (ALDACTONE) 25 MG tablet TAKE ONE (1) TABLET BY MOUTH EVERY DAY   UNIFINE PENTIPS 31G X 8 MM MISC USE FOR INSULIN INJECTIONS THREE TIMES DAILY.   Vitamin D, Ergocalciferol, (DRISDOL) 1.25 MG (50000 UNIT) CAPS capsule Take 1 capsule (50,000 Units total) by mouth every 7 (seven) days.   No facility-administered encounter medications on file as of 12/04/2020.    Allergies (verified) Haloperidol lactate and Lipitor [atorvastatin calcium]   History: Past Medical History:  Diagnosis Date   Arthritis    Diabetes mellitus without complication (HCC)    Diastolic dysfunction 04/6107   Grade 1   GERD (gastroesophageal reflux disease)    Hyperlipidemia    Hyperlipidemia    Hypertension 04/15/2012   Morbid obesity ([place])    PE (pulmonary embolism)    Pulmonary emboli ([place]) 1999   post TAH   Sleep apnea    Past Surgical History:  Procedure Laterality Date   ABDOMINAL HYSTERECTOMY  1999   , totalfor fibroids   [place]   COLONOSCOPY N/A 10/31/2015   Procedure: COLONOSCOPY;  Surgeon: Najeeb U Rehman, MD;  [facility];  Service: Endoscopy;  Laterality: N/A;  930   Family History  Problem Relation Age of Onset   Diabetes Mother    Hypertension Mother    Depression Mother    Hyperlipidemia Father    Hypertension Father    Alcohol abuse Father    Cirrhosis Father    Aneurysm Brother    Sleep apnea Sister    Arthritis Sister    Coronary artery disease Sister    Heart attack Sister 42   Heart murmur Son    Hypertension Son    Depression Son    Social History   Socioeconomic  History   Marital status: Single    Spouse name: Not on file   Number of children: Not on file   Years of education: Not on file   Highest education level: Not on file  Occupational History   Not on file  Tobacco Use   Smoking status: Never   Smokeless tobacco: Never  Vaping Use   Vaping Use: Never used  Substance and Sexual Activity   Alcohol use: No    Alcohol/week: 0.0 standard drinks   Drug use: No   Sexual activity: Not Currently  Other Topics Concern   Not on file  Social History Narrative   Not on file   Social Determinants of Health   Financial Resource Strain: Low Risk    Difficulty of Paying Living Expenses: Not hard at all  Food Insecurity: No Food Insecurity   Worried About Running Out of Food in the Last Year: Never true   Ran Out of Food in the Last Year: Never true  Transportation Needs: No Transportation Needs   Lack of Transportation (Medical): No   Lack of Transportation (Non-Medical): No  Physical Activity: Inactive   Days of Exercise per Week: 0 days   Minutes of Exercise per Session: 0 min  Stress: No Stress Concern Present   Feeling of Stress : Not at all  Social Connections: Moderately Isolated   Frequency of Communication with Friends and Family: More than three times a week   Frequency of Social Gatherings with Friends and Family: More than three times a week   Attends Religious Services: 1 to 4 times per year   Active Member of Clubs or Organizations: No   Attends Club or Organization Meetings: Never   Marital Status: Divorced    Tobacco Counseling Counseling given: Not Answered   Clinical Intake:  Pre-visit preparation completed: Yes  Pain : No/denies pain     Nutritional Risks: None Diabetes: Yes  How often do you need to have someone help you when you read instructions, pamphlets, or other written materials from your doctor or pharmacy?: 1 - Never  Diabetic?Yes Nutrition Risk Assessment:  Has the patient had any N/V/D  within the last 2 months?  No  Does the patient have any non-healing wounds?  No  Has the patient had any unintentional weight loss or weight gain?  No   Diabetes:  Is the patient diabetic?  Yes  If diabetic, was a CBG obtained today?   N/A Did the patient bring in their glucometer from home?   N/A How often do you monitor your CBG's? Daily.   Financial Strains and Diabetes Management:  Are you having any financial strains with the device, your supplies or your medication? No .  Does the patient want to be seen by Chronic Care Management for management of their diabetes?    No  Would the patient like to be referred to a Nutritionist or for Diabetic Management?  No   Diabetic Exams:  Diabetic Eye Exam: Overdue for diabetic eye exam. Pt has been advised about the importance in completing this exam. Patient advised to call and schedule an eye exam. Diabetic Foot Exam: Overdue, Pt has been advised about the importance in completing this exam. Pt is scheduled for diabetic foot exam on N/A.   Interpreter Needed?: No  Information entered by :: Melaya Hoselton J,CMA   Activities of Daily Living In your present state of health, do you have any difficulty performing the following activities: 12/04/2020  Hearing? N  Vision? N  Difficulty concentrating or making decisions? N  Walking or climbing stairs? Y  Dressing or bathing? N  Doing errands, shopping? N  Preparing Food and eating ? N  Using the Toilet? N  In the past six months, have you accidently leaked urine? Y  Do you have problems with loss of bowel control? Y  Managing your Medications? N  Managing your Finances? N  Housekeeping or managing your Housekeeping? N  Some recent data might be hidden    Patient Care Team: Fayrene Helper, MD as PCP - General (Family Medicine) Fay Records, MD as PCP - Cardiology (Cardiology) Fay Records, MD as Consulting Physician (Cardiology) Phillips Odor, MD as Consulting Physician  (Neurology) Rutherford Guys, MD as Consulting Physician (Ophthalmology)  Indicate any recent Medical Services you may have received from other than [facility] providers in the past year (date may be approximate).     Assessment:   This is a routine wellness examination for Tracey [name].  Hearing/Vision screen No results found.  Dietary issues and exercise activities discussed: Current Exercise Habits: The patient does not participate in regular exercise at present   Goals Addressed   None    Depression Screen PHQ 2/9 Scores 12/04/2020 11/30/2019 11/29/2018 04/11/2018 03/21/2018 12/14/2017 11/22/2017  PHQ - 2 Score 0 0 0 4 2 0 2  PHQ- 9 Score - - - 9 11 - 10    Fall Risk Fall Risk  12/04/2020 07/15/2020 01/31/2020 11/30/2019 03/20/2019  Falls in the past year? 1 0 1 1 0  Number falls in past yr: 0 - 0 1 -  Comment - - - - -  Injury with Fall? 0 - 0 1 -  Comment - - - - -  Risk for fall due to : No Fall Risks - - Impaired balance/gait;History of fall(s) -  Follow up Falls evaluation completed Falls evaluation completed - Falls evaluation completed;Education provided;Falls prevention discussed Falls evaluation completed    FALL RISK PREVENTION PERTAINING TO THE HOME:  Any stairs in or around the home? Yes  If so, are there any without handrails? No  Home free of loose throw rugs in walkways, pet beds, electrical cords, etc? Yes  Adequate lighting in your home to reduce risk of falls? Yes   ASSISTIVE DEVICES UTILIZED TO PREVENT FALLS:  Life alert? No  Use of a cane, walker or w/c? Yes  Grab bars in the bathroom? No  Shower chair or bench in shower? Yes  Elevated toilet seat or a handicapped toilet? No   TIMED UP AND GO:  Was the test performed?  N/A .  Length of time to ambulate 10 feet: N/A sec.     Cognitive Function:     6CIT Screen 12/04/2020 11/30/2019 11/29/2018 11/22/2017 11/16/2016  What Year? 0 points 0 points 0 points 0 points  0 points  What month? 0 points 0 points 0 points 0  points 0 points  What time? 0 points 0 points 0 points 0 points 0 points  Count back from 20 0 points 0 points 0 points - 0 points  Months in reverse 0 points 0 points 0 points - 0 points  Repeat phrase 0 points 0 points 0 points - 0 points  Total Score 0 0 0 - 0    Immunizations Immunization History  Administered Date(s) Administered   Influenza,inj,Quad PF,6+ Mos 04/01/2015, 03/16/2016, 01/20/2017, 04/11/2018, 02/13/2019, 01/31/2020   Moderna Sars-Covid-2 Vaccination 12/13/2019, 01/11/2020   Pneumococcal Polysaccharide-23 05/13/2015    TDAP status: Due, Education has been provided regarding the importance of this vaccine. Advised may receive this vaccine at local pharmacy or Health Dept. Aware to provide a copy of the vaccination record if obtained from local pharmacy or Health Dept. Verbalized acceptance and understanding.  Flu Vaccine status: Due, Education has been provided regarding the importance of this vaccine. Advised may receive this vaccine at local pharmacy or Health Dept. Aware to provide a copy of the vaccination record if obtained from local pharmacy or Health Dept. Verbalized acceptance and understanding.  Pneumococcal vaccine status: Due, Education has been provided regarding the importance of this vaccine. Advised may receive this vaccine at local pharmacy or Health Dept. Aware to provide a copy of the vaccination record if obtained from local pharmacy or Health Dept. Verbalized acceptance and understanding.  Covid-19 vaccine status: Information provided on how to obtain vaccines.   Qualifies for Shingles Vaccine? Yes   Zostavax completed  N/A   Shingrix Completed?: No.    Education has been provided regarding the importance of this vaccine. Patient has been advised to call insurance company to determine out of pocket expense if they have not yet received this vaccine. Advised may also receive vaccine at local pharmacy or Health Dept. Verbalized acceptance and  understanding.  Screening Tests Health Maintenance  Topic Date Due   TETANUS/TDAP  Never done   Zoster Vaccines- Shingrix (1 of 2) Never done   Pneumococcal Vaccine 0-64 Years old (2 - PCV) 05/12/2016   OPHTHALMOLOGY EXAM  10/16/2018   PAP SMEAR-Modifier  07/16/2019   COVID-19 Vaccine (3 - Moderna risk series) 02/08/2020   FOOT EXAM  06/08/2020   INFLUENZA VACCINE  11/11/2020   URINE MICROALBUMIN  12/20/2020   HEMOGLOBIN A1C  05/03/2021   MAMMOGRAM  06/04/2022   COLONOSCOPY (Pts 45-49yrs Insurance coverage will need to be confirmed)  10/30/2025   PNEUMOCOCCAL POLYSACCHARIDE VACCINE AGE 2-64 HIGH RISK  Completed   Hepatitis C Screening  Completed   HIV Screening  Completed   HPV VACCINES  Aged Out    Health Maintenance  Health Maintenance Due  Topic Date Due   TETANUS/TDAP  Never done   Zoster Vaccines- Shingrix (1 of 2) Never done   Pneumococcal Vaccine 0-64 Years old (2 - PCV) 05/12/2016   OPHTHALMOLOGY EXAM  10/16/2018   PAP SMEAR-Modifier  07/16/2019   COVID-19 Vaccine (3 - Moderna risk series) 02/08/2020   FOOT EXAM  06/08/2020   INFLUENZA VACCINE  11/11/2020   URINE MICROALBUMIN  12/20/2020    Colorectal cancer screening: Type of screening: Colonoscopy. Completed 10/31/2015. Repeat every 10 years  Mammogram status: Completed 05/15/2020. Repeat every year  Bone Density Screening: patient will discuss with PCP.  Lung Cancer Screening: (Low Dose CT Chest recommended if Age 55-80 years, 30 pack-year currently smoking OR have quit w/in 15years.) does not   qualify.   Lung Cancer Screening Referral: NO  Additional Screening:  Hepatitis C Screening: does qualify; Completed 05/04/2017  Vision Screening: Recommended annual ophthalmology exams for early detection of glaucoma and other disorders of the eye. Is the patient up to date with their annual eye exam?  No  Who is the provider or what is the name of the office in which the patient attends annual eye exams? N/A If pt  is not established with a provider, would they like to be referred to a provider to establish care? No .   Dental Screening: Recommended annual dental exams for proper oral hygiene  Community Resource Referral / Chronic Care Management: CRR required this visit?  No   CCM required this visit?  No      Plan:     I have personally reviewed and noted the following in the patient's chart:   Medical and social history Use of alcohol, tobacco or illicit drugs  Current medications and supplements including opioid prescriptions.  Functional ability and status Nutritional status Physical activity Advanced directives List of other physicians Hospitalizations, surgeries, and ER visits in previous 12 months Vitals Screenings to include cognitive, depression, and falls Referrals and appointments  In addition, I have reviewed and discussed with patient certain preventive protocols, quality metrics, and best practice recommendations. A written personalized care plan for preventive services as well as general preventive health recommendations were provided to patient.     Genera  Jenkins, CMA   12/04/2020   Nurse Notes: Non Face to Face 30 minute visit Encounter.   Ms. Muramoto , Thank you for taking time to come for your Medicare Wellness Visit. I appreciate your ongoing commitment to your health goals. Please review the following plan we discussed and let me know if I can assist you in the future.   These are the goals we discussed:  Goals      Exercise 3x per week (30 min per time)     Recommend starting a routine exercise program at least 3 days a week for 30-45 minutes at a time as tolerated.          This is a list of the screening recommended for you and due dates:  Health Maintenance  Topic Date Due   Tetanus Vaccine  Never done   Zoster (Shingles) Vaccine (1 of 2) Never done   Pneumococcal Vaccination (2 - PCV) 05/12/2016   Eye exam for diabetics  10/16/2018   Pap  Smear  07/16/2019   COVID-19 Vaccine (3 - Moderna risk series) 02/08/2020   Complete foot exam   06/08/2020   Flu Shot  11/11/2020   Urine Protein Check  12/20/2020   Hemoglobin A1C  05/03/2021   Mammogram  06/04/2022   Colon Cancer Screening  10/30/2025   Pneumococcal vaccine  Completed   Hepatitis C Screening: USPSTF Recommendation to screen - Ages 18-79 yo.  Completed   HIV Screening  Completed   HPV Vaccine  Aged Out    Patient has been advised to contact PCP office to schedule follow up appointment.        

## 2020-12-05 ENCOUNTER — Other Ambulatory Visit: Payer: Self-pay | Admitting: Internal Medicine

## 2020-12-05 ENCOUNTER — Ambulatory Visit (INDEPENDENT_AMBULATORY_CARE_PROVIDER_SITE_OTHER): Payer: Medicare Other | Admitting: Internal Medicine

## 2020-12-05 ENCOUNTER — Ambulatory Visit (INDEPENDENT_AMBULATORY_CARE_PROVIDER_SITE_OTHER): Payer: Medicare Other | Admitting: Family Medicine

## 2020-12-05 ENCOUNTER — Encounter: Payer: Self-pay | Admitting: Internal Medicine

## 2020-12-05 ENCOUNTER — Ambulatory Visit (INDEPENDENT_AMBULATORY_CARE_PROVIDER_SITE_OTHER): Payer: Medicare Other

## 2020-12-05 ENCOUNTER — Encounter: Payer: Self-pay | Admitting: Family Medicine

## 2020-12-05 VITALS — BP 140/70 | HR 105 | Ht 70.0 in | Wt 392.0 lb

## 2020-12-05 VITALS — BP 145/81 | HR 111 | Resp 16 | Ht 70.0 in | Wt 391.8 lb

## 2020-12-05 DIAGNOSIS — E669 Obesity, unspecified: Secondary | ICD-10-CM | POA: Diagnosis not present

## 2020-12-05 DIAGNOSIS — R002 Palpitations: Secondary | ICD-10-CM

## 2020-12-05 DIAGNOSIS — D539 Nutritional anemia, unspecified: Secondary | ICD-10-CM

## 2020-12-05 DIAGNOSIS — I5189 Other ill-defined heart diseases: Secondary | ICD-10-CM

## 2020-12-05 DIAGNOSIS — E1169 Type 2 diabetes mellitus with other specified complication: Secondary | ICD-10-CM

## 2020-12-05 DIAGNOSIS — Z23 Encounter for immunization: Secondary | ICD-10-CM

## 2020-12-05 DIAGNOSIS — I10 Essential (primary) hypertension: Secondary | ICD-10-CM | POA: Diagnosis not present

## 2020-12-05 DIAGNOSIS — E782 Mixed hyperlipidemia: Secondary | ICD-10-CM | POA: Diagnosis not present

## 2020-12-05 DIAGNOSIS — K219 Gastro-esophageal reflux disease without esophagitis: Secondary | ICD-10-CM | POA: Diagnosis not present

## 2020-12-05 DIAGNOSIS — R000 Tachycardia, unspecified: Secondary | ICD-10-CM

## 2020-12-05 NOTE — Patient Instructions (Signed)
F/u in 6 to 8 weeksd, call if you need me sooner  Flu and pneumonia 20 vaccines today  Labs today ( after  cardiology appt) copy to [facility], cBC, lipid, cmp and eGFr and tSH and B12  and magnesium  Do not pull at skin tag/ mole on foot  Wear shoes and exam feet daily as you do not feel in your feet  Thanks for choosing [facility], we consider it a privelige to serve you.

## 2020-12-05 NOTE — Progress Notes (Signed)
Cardiology Office Note   Date:  12/05/2020   ID:  Tracey [name], [name] 07-02-1955, MRN [id number]  PCP:  Fayrene Helper, MD  Cardiologist:   [name], MD   Pt presents for f/u of HTN and chronic SOB      History of Present Illness: Tracey [name] is a 65 y.o. female with a history of CP, DM, HTN, PE  HL and morbid obesity.  Dobutamine echo in 2014 normal   Echo in 2020 LVEF normal    I last saw the pt in July 2022   BP at time 140s/  Her HR was fast when I saw her  110s   Labs checked     Patient says that with walking in apartment she will get a little SOB   She has to crawl up steps due to unsteadiness, concern of falling  She denies CP       Current Meds  Medication Sig   acetaminophen (TYLENOL) 500 MG tablet Take 500 mg by mouth every 6 (six) hours as needed for moderate pain.   aspirin EC 81 MG tablet Take 1 tablet (81 mg total) by mouth daily.   blood glucose meter kit and supplies KIT Dispense based on patient and insurance preference. Use up to four times daily as directed.   Cholecalciferol (VITAMIN D-3) 125 MCG (5000 UT) TABS Take 5,000 Units by mouth daily.   glipiZIDE (GLUCOTROL XL) 10 MG 24 hr tablet TAKE ONE TABLET (10MG TOTAL) BY MOUTH DAILY WITH BREAKFAST   glucose blood test strip 1 each by Other route 4 (four) times daily. Use as instructed   insulin regular human CONCENTRATED (HUMULIN R U-500 KWIKPEN) 500 UNIT/ML KwikPen INJECT 55 UNITS INTO THE SKIN 3 TIMES DAILY WITH MEALS.   Lancets MISC Use to test BG qid. Dx : E11.65. Softclix Lancets   metFORMIN (GLUCOPHAGE) 1000 MG tablet TAKE ONE TABLET BY MOUTH TWICE A DAY WITH A MEAL   metoprolol tartrate (LOPRESSOR) 50 MG tablet TAKE ONE TABLET (50MG TOTAL) BY MOUTH TWO TIMES DAILY   omeprazole (PRILOSEC) 20 MG capsule TAKE ONE CAPSULE BY MOUTH DAILY   spironolactone (ALDACTONE) 25 MG tablet TAKE ONE (1) TABLET BY MOUTH EVERY DAY   UNIFINE PENTIPS 31G X 8 MM MISC USE FOR INSULIN INJECTIONS THREE  TIMES DAILY.     Allergies:   Haloperidol lactate and Lipitor [atorvastatin calcium]   Past Medical History:  Diagnosis Date   Arthritis    Diabetes mellitus without complication (HCC)    Diastolic dysfunction 05/353   Grade 1   GERD (gastroesophageal reflux disease)    Hyperlipidemia    Hyperlipidemia    Hypertension 04/15/2012   Morbid obesity ([place])    PE (pulmonary embolism)    Pulmonary emboli ([place]) 1999   post TAH   Sleep apnea     Past Surgical History:  Procedure Laterality Date   ABDOMINAL HYSTERECTOMY  1999   , totalfor fibroids   CESAREAN SECTION     CHOLECYSTECTOMY  1996   COLONOSCOPY N/A 10/31/2015   Procedure: COLONOSCOPY;  Surgeon: Rogene Houston, MD;  [facility];  Service: Endoscopy;  Laterality: N/A;  930     Social History:  The patient  reports that she has never smoked. She has never used smokeless tobacco. She reports that she does not drink alcohol and does not use drugs.   Family History:  The patient's family history includes Alcohol abuse in her father; Aneurysm in  her brother; Arthritis in her sister; Cirrhosis in her father; Coronary artery disease in her sister; Depression in her mother and son; Diabetes in her mother; Heart attack (age of onset: 37) in her sister; Heart murmur in her son; Hyperlipidemia in her father; Hypertension in her father, mother, and son; Sleep apnea in her sister.    ROS:  Please see the history of present illness. All other systems are reviewed and  Negative to the above problem except as noted.    PHYSICAL EXAM: VS:  BP 140/70 (BP Location: Left Arm, Patient Position: Sitting, Cuff Size: Normal)   Pulse (!) 105   Ht _0  (1.778 m)   Wt (!) 392 lb (177.8 kg)   SpO2 98%   BMI 56.25 kg/m   GEN: Morbidly obese 65 yo in no acute distress  HEENT: normal  Neck: no obvious JVD Cardiac: RRR; no murmurs,,Triv to 1+ LE  edema  Respiratory:  clear to auscultation bilaterally,  GI: soft, Obese Skin: warm  and dry, no rash Neuro:  Grossly intact Psych: euthymic mood, full affect   EKG:  EKG is not ordered today.    Lipid Panel    Component Value Date/Time   CHOL 133 02/01/2020 1415   TRIG 117 02/01/2020 1415   HDL 38 (L) 02/01/2020 1415   CHOLHDL 3.5 02/01/2020 1415   VLDL 23 07/13/2016 1430   LDLCALC 75 02/01/2020 1415      Wt Readings from Last 3 Encounters:  12/05/20 (!) 392 lb (177.8 kg)  12/05/20 (!) 391 lb 12.8 oz (177.7 kg)  11/25/20 (!) 394 lb (178.7 kg)      ASSESSMENT AND PLAN:  1  Tachycardia   Each time the pt has been in clinic she has been mildly tachycardic   EKG with ST    Will get monitor to evaluate 24 hour control      2  Hx of HTN  BP is fair  Follow   Will check BNP    3 Hx of CP   PT denies pain   4  DM   last A1C is 8%      5.   HL     Curr  off of statin         Current medicines are reviewed at length with the patient today.  The patient does not have concerns regarding medicines.  Signed, [name], MD  12/05/2020 10:18 PM    [address], [place], [place]  [address] Phone: [phone]; Fax: [phone]

## 2020-12-05 NOTE — Patient Instructions (Signed)
Medication Instructions:  Your physician recommends that you continue on your current medications as directed. Please refer to the Current Medication list given to you today.  *If you need a refill on your cardiac medications before your next appointment, please call your pharmacy*   Lab Work: Your physician recommends that you return for lab work in: [facility]   If you have labs (blood work) drawn today and your tests are completely normal, you will receive your results only by: [place] (if you have MyChart) OR A paper copy in the mail If you have any lab test that is abnormal or we need to change your treatment, we will call you to review the results.   Testing/Procedures: [place] [place]- Long Term Monitor Instructions   Your physician has requested you wear your ZIO patch monitor___3____days.   This is a single patch monitor.  [employer] supplies one patch monitor per enrollment.  Additional stickers are not available.   Please do not apply patch if you will be having a Nuclear Stress Test, Echocardiogram, Cardiac CT, MRI, or Chest Xray during the time frame you would be wearing the monitor. The patch cannot be worn during these tests.  You cannot remove and re-apply the ZIO XT patch monitor.   Your ZIO patch monitor will be sent USPS Priority mail from [facility] directly to your home address. The monitor may also be mailed to a PO BOX if home delivery is not available.   It may take 3-5 days to receive your monitor after you have been enrolled.   Once you have received you monitor, please review enclosed instructions.  Your monitor has already been registered assigning a specific monitor serial # to you.   Applying the monitor   Shave hair from upper left chest.   Hold abrader disc by orange tab.  Rub abrader in 40 strokes over left upper chest as indicated in your monitor instructions.   Clean area with 4 enclosed alcohol pads .  Use all pads to assure are is cleaned  thoroughly.  Let dry.   Apply patch as indicated in monitor instructions.  Patch will be place under collarbone on left side of chest with arrow pointing upward.   Rub patch adhesive wings for 2 minutes.Remove white label marked "1".  Remove white label marked "2".  Rub patch adhesive wings for 2 additional minutes.   While looking in a mirror, press and release button in center of patch.  A small green light will flash 3-4 times .  This will be your only indicator the monitor has been turned on.     Do not shower for the first 24 hours.  You may shower after the first 24 hours.   Press button if you feel a symptom. You will hear a small click.  Record Date, Time and Symptom in the Patient Log Book.   When you are ready to remove patch, follow instructions on last 2 pages of Patient Log Book.  Stick patch monitor onto last page of Patient Log Book.   Place Patient Log Book in [place] box.  Use locking tab on box and tape box closed securely.  The [employer] has [employer] on it.  Please place in mailbox as soon as possible.  Your physician should have your test results approximately 7 days after the monitor has been mailed back to [employer].   Call [place] at [phone] if you have questions regarding your ZIO XT patch  monitor.  Call them immediately if you see an orange light blinking on your monitor.   If your monitor falls off in less than 4 days contact our Monitor department at [phone].  If your monitor becomes loose or falls off after 4 days call [employer] at [phone] for suggestions on securing your monitor.     Follow-Up: At [facility], you and your health needs are our priority.  As part of our continuing mission to provide you with exceptional heart care, we have created designated Provider Care Teams.  These Care Teams include your primary Cardiologist (physician) and Advanced Practice Providers (APPs -  Physician Assistants and  Nurse Practitioners) who all work together to provide you with the care you need, when you need it.  We recommend signing up for the patient portal called "MyChart".  Sign up information is provided on this After Visit Summary.  MyChart is used to connect with patients for Virtual Visits (Telemedicine).  Patients are able to view lab/test results, encounter notes, upcoming appointments, etc.  Non-urgent messages can be sent to your provider as well.   To learn more about what you can do with MyChart, go to [email or web address].    Your next appointment:    December   The format for your next appointment:   In Person  Provider:   [name], MD   Other Instructions Thank you for choosing [facility]!

## 2020-12-06 ENCOUNTER — Other Ambulatory Visit: Payer: Self-pay

## 2020-12-06 DIAGNOSIS — E782 Mixed hyperlipidemia: Secondary | ICD-10-CM

## 2020-12-06 LAB — CMP14+EGFR
ALT: 32 IU/L (ref 0–32)
AST: 37 IU/L (ref 0–40)
Albumin/Globulin Ratio: 1.3 (ref 1.2–2.2)
Albumin: 4.2 g/dL (ref 3.8–4.8)
Alkaline Phosphatase: 64 IU/L (ref 44–121)
BUN/Creatinine Ratio: 13 (ref 12–28)
BUN: 11 mg/dL (ref 8–27)
Bilirubin Total: 0.3 mg/dL (ref 0.0–1.2)
CO2: 18 mmol/L — ABNORMAL LOW (ref 20–29)
Calcium: 9.4 mg/dL (ref 8.7–10.3)
Chloride: 99 mmol/L (ref 96–106)
Creatinine, Ser: 0.85 mg/dL (ref 0.57–1.00)
Globulin, Total: 3.2 g/dL (ref 1.5–4.5)
Glucose: 172 mg/dL — ABNORMAL HIGH (ref 65–99)
Potassium: 4.2 mmol/L (ref 3.5–5.2)
Sodium: 139 mmol/L (ref 134–144)
Total Protein: 7.4 g/dL (ref 6.0–8.5)
eGFR: 77 mL/min/{1.73_m2} (ref >59)

## 2020-12-06 LAB — TSH: TSH: 0.727 u[IU]/mL (ref 0.450–4.500)

## 2020-12-06 LAB — CBC
Hematocrit: 40.1 % (ref 34.0–46.6)
Hemoglobin: 12.5 g/dL (ref 11.1–15.9)
MCH: 23.4 pg — ABNORMAL LOW (ref 26.6–33.0)
MCHC: 31.2 g/dL — ABNORMAL LOW (ref 31.5–35.7)
MCV: 75 fL — ABNORMAL LOW (ref 79–97)
Platelets: 297 10*3/uL (ref 150–450)
RBC: 5.35 x10E6/uL — ABNORMAL HIGH (ref 3.77–5.28)
RDW: 18.5 % — ABNORMAL HIGH (ref 11.7–15.4)
WBC: 10.2 10*3/uL (ref 3.4–10.8)

## 2020-12-06 LAB — LIPID PANEL
Chol/HDL Ratio: 5.7 ratio — ABNORMAL HIGH (ref 0.0–4.4)
Cholesterol, Total: 239 mg/dL — ABNORMAL HIGH (ref 100–199)
HDL: 42 mg/dL (ref >39)
LDL Chol Calc (NIH): 174 mg/dL — ABNORMAL HIGH (ref 0–99)
Triglycerides: 128 mg/dL (ref 0–149)
VLDL Cholesterol Cal: 23 mg/dL (ref 5–40)

## 2020-12-06 LAB — MAGNESIUM: Magnesium: 1.8 mg/dL (ref 1.6–2.3)

## 2020-12-06 LAB — VITAMIN B12: Vitamin B-12: 311 pg/mL (ref 232–1245)

## 2020-12-06 MED ORDER — ROSUVASTATIN CALCIUM 20 MG PO TABS: 20.0000 mg | ORAL_TABLET | Freq: Every day | ORAL | 1 refills | Status: DC

## 2020-12-08 ENCOUNTER — Encounter: Payer: Self-pay | Admitting: Family Medicine

## 2020-12-08 NOTE — Progress Notes (Signed)
OLEAN BASHER     MRN: [id number]      DOB: 1955-12-02   HPI [name] is here for follow up and re-evaluation of chronic medical conditions, medication management and review of any available recent lab and radiology data.  Preventive health is updated, specifically  Cancer screening and Immunization.   Questions or concerns regarding consultations or procedures which the PT has had in the interim are  addressed. The PT denies any adverse reactions to current medications since the last visit.  10 month h/o enlarging sore / growth on right foot, at times drains pus, being managed by Podiatry. Blood sugar is uncontrolled   ROS Denies recent fever or chills. Denies sinus pressure, nasal congestion, ear pain or sore throat. Denies chest congestion, productive cough or wheezing. Denies chest pains, palpitations and leg swelling Denies abdominal pain, nausea, vomiting,diarrhea or constipation.   Denies dysuria, frequency, hesitancy or incontinence. C/o chronic  joint pain, swelling and limitation in mobility. Denies headaches, seizures, numbness, or tingling. Denies depression, anxiety or insomnia.   PE  BP (!) 145/81   Pulse (!) 111   Resp 16   Ht '5\' 10"'$  (1.778 m)   Wt (!) 391 lb 12.8 oz (177.7 kg)   SpO2 92%   BMI 56.22 kg/m   Patient alert and oriented and in no cardiopulmonary distress.  HEENT: No facial asymmetry, EOMI,     Neck supple .  Chest: Clear to auscultation bilaterally.  CVS: S1, S2 no murmurs, no S3.Regular rate.  ABD: Soft non tender.   Ext: trace  edema  MS: decreased ROM spine, shoulders, hips and knees.  Skin:skin tag on right foot, no erythema or drainage.  Psych: Good eye contact, normal affect. Memory intact not anxious or depressed appearing.  CNS: CN 2-12 intact, power,  normal throughout.no focal deficits noted.   Assessment & Plan  Essential hypertension, benign Uncontrolled has appt with Cardiolgy today will have Cardiology  addrss this DASH diet and commitment to daily physical activity for a minimum of 30 minutes discussed and encouraged, as a part of hypertension management. The importance of attaining a healthy weight is also discussed.  BP/Weight 12/05/2020 12/05/2020 11/25/2020 10/31/2020 10/17/2020 07/15/2020 [id number]  Systolic BP Q000111Q XX123456 123456 Q000111Q 123456 [id number] 0000000  Diastolic BP 81 70 74 82 84 98 64  Wt. (Lbs) 391.8 392 394 397.8 392 393 390.8  BMI 56.22 56.25 56.53 57.08 56.25 56.39 56.07       Diabetes mellitus type 2 in obese [facility]) [name] is reminded of the importance of commitment to daily physical activity for 30 minutes or more, as able and the need to limit carbohydrate intake to 30 to 60 grams per meal to help with blood sugar control.   The need to take medication as prescribed, test blood sugar as directed, and to call between visits if there is a concern that blood sugar is uncontrolled is also discussed.   [name] is reminded of the importance of daily foot exam, annual eye examination, and good blood sugar, blood pressure and cholesterol control. Deteriorated, managed by Endo '  Diabetic Labs Latest Ref Rng & Units 12/05/2020 10/31/2020 07/15/2020 07/08/2020 02/01/2020  HbA1c 4.0 - 5.6 % - 8.0(A) 7.0(A) - -  Microalbumin mg/dL - - - - -  Micro/Creat Ratio <30 mcg/mg creat - - - - -  Chol 100 - 199 mg/dL 239(H) - - - 133  HDL >39 mg/dL 42 - - - 38(L)  Calc LDL  0 - 99 mg/dL 174(H) - - - 75  Triglycerides 0 - 149 mg/dL 128 - - - 117  Creatinine 0.57 - 1.00 mg/dL 0.85 - - 0.84 -   BP/Weight 12/05/2020 12/05/2020 11/25/2020 10/31/2020 10/17/2020 07/15/2020 [id number]  Systolic BP Q000111Q XX123456 123456 Q000111Q 123456 XX123456 0000000  Diastolic BP 81 70 74 82 84 98 64  Wt. (Lbs) 391.8 392 394 397.8 392 393 390.8  BMI 56.22 56.25 56.53 57.08 56.25 56.39 56.07   Foot/eye exam completion dates Latest Ref Rng & Units 02/13/2019 08/19/2016  Eye Exam No Retinopathy - No Retinopathy  Foot Form Completion - Done -        Morbid  obesity  Patient re-educated about  the importance of commitment to a  minimum of 150 minutes of exercise per week as able.  The importance of healthy food choices with portion control discussed, as well as eating regularly and within a 12 hour window most days. The need to choose "clean , green" food 50 to 75% of the time is discussed, as well as to make water the primary drink and set a goal of 64 ounces water daily.    Weight /BMI 12/05/2020 12/05/2020 11/25/2020  WEIGHT 391 lb 12.8 oz 392 lb 394 lb  HEIGHT '5\' 10"'$  '5\' 10"'$  '5\' 10"'$   BMI 56.22 kg/m2 56.25 kg/m2 56.53 kg/m2      Mixed hyperlipidemia Hyperlipidemia:Low fat diet discussed and encouraged.   Lipid Panel  Lab Results  Component Value Date   CHOL 239 (H) 12/05/2020   HDL 42 12/05/2020   LDLCALC 174 (H) 12/05/2020   TRIG 128 12/05/2020   CHOLHDL 5.7 (H) 12/05/2020     Uncontrolled not at goal, resume statin and lower fat intake  GERD Controlled, no change in medication   Diastolic dysfunction No s/s of decompensation currently

## 2020-12-08 NOTE — Assessment & Plan Note (Signed)
Ms. Perazzo is reminded of the importance of commitment to daily physical activity for 30 minutes or more, as able and the need to limit carbohydrate intake to 30 to 60 grams per meal to help with blood sugar control.   The need to take medication as prescribed, test blood sugar as directed, and to call between visits if there is a concern that blood sugar is uncontrolled is also discussed.   Ms. Saraceno is reminded of the importance of daily foot exam, annual eye examination, and good blood sugar, blood pressure and cholesterol control. Deteriorated, managed by Endo '  Diabetic Labs Latest Ref Rng & Units 12/05/2020 10/31/2020 07/15/2020 07/08/2020 02/01/2020  HbA1c 4.0 - 5.6 % - 8.0(A) 7.0(A) - -  Microalbumin mg/dL - - - - -  Micro/Creat Ratio <30 mcg/mg creat - - - - -  Chol 100 - 199 mg/dL 239(H) - - - 133  HDL >39 mg/dL 42 - - - 38(L)  Calc LDL 0 - 99 mg/dL 174(H) - - - 75  Triglycerides 0 - 149 mg/dL 128 - - - 117  Creatinine 0.57 - 1.00 mg/dL 0.85 - - 0.84 -   BP/Weight 12/05/2020 12/05/2020 11/25/2020 10/31/2020 10/17/2020 07/15/2020 [id number]  Systolic BP Q000111Q XX123456 123456 Q000111Q 123456 XX123456 0000000  Diastolic BP 81 70 74 82 84 98 64  Wt. (Lbs) 391.8 392 394 397.8 392 393 390.8  BMI 56.22 56.25 56.53 57.08 56.25 56.39 56.07   Foot/eye exam completion dates Latest Ref Rng & Units 02/13/2019 08/19/2016  Eye Exam No Retinopathy - No Retinopathy  Foot Form Completion - Done -

## 2020-12-08 NOTE — Assessment & Plan Note (Signed)
Uncontrolled has appt with Cardiolgy today will have Cardiology addrss this DASH diet and commitment to daily physical activity for a minimum of 30 minutes discussed and encouraged, as a part of hypertension management. The importance of attaining a healthy weight is also discussed.  BP/Weight 12/05/2020 12/05/2020 11/25/2020 10/31/2020 10/17/2020 07/15/2020 [id number]  Systolic BP Q000111Q XX123456 123456 Q000111Q 123456 XX123456 0000000  Diastolic BP 81 70 74 82 84 98 64  Wt. (Lbs) 391.8 392 394 397.8 392 393 390.8  BMI 56.22 56.25 56.53 57.08 56.25 56.39 56.07

## 2020-12-08 NOTE — Assessment & Plan Note (Signed)
No s/s of decompensation currently

## 2020-12-08 NOTE — Assessment & Plan Note (Signed)
  Patient re-educated about  the importance of commitment to a  minimum of 150 minutes of exercise per week as able.  The importance of healthy food choices with portion control discussed, as well as eating regularly and within a 12 hour window most days. The need to choose "clean , green" food 50 to 75% of the time is discussed, as well as to make water the primary drink and set a goal of 64 ounces water daily.    Weight /BMI 12/05/2020 12/05/2020 11/25/2020  WEIGHT 391 lb 12.8 oz 392 lb 394 lb  HEIGHT '5\' 10"'$  '5\' 10"'$  '5\' 10"'$   BMI 56.22 kg/m2 56.25 kg/m2 56.53 kg/m2

## 2020-12-08 NOTE — Assessment & Plan Note (Signed)
Controlled, no change in medication  

## 2020-12-08 NOTE — Assessment & Plan Note (Signed)
Hyperlipidemia:Low fat diet discussed and encouraged.   Lipid Panel  Lab Results  Component Value Date   CHOL 239 (H) 12/05/2020   HDL 42 12/05/2020   LDLCALC 174 (H) 12/05/2020   TRIG 128 12/05/2020   CHOLHDL 5.7 (H) 12/05/2020     Uncontrolled not at goal, resume statin and lower fat intake

## 2020-12-09 ENCOUNTER — Telehealth: Payer: Self-pay

## 2020-12-09 DIAGNOSIS — Z79899 Other long term (current) drug therapy: Secondary | ICD-10-CM

## 2020-12-09 NOTE — Telephone Encounter (Signed)
-----   Message from [name] V, MD sent at 12/06/2020  1:57 PM EDT ----- Labs  LDL is very high   174  Given DM  Needs to be lower   Would recomm Crestor 20 mg    F/U lipids in 8 wks with AST

## 2020-12-09 NOTE — Telephone Encounter (Signed)
I spoke with patient and she said Dr/Simpson started her on crestor on 12/06/20. She will repeat lipids and ast in 8 weeks, request placed to [place] lab.

## 2020-12-10 ENCOUNTER — Other Ambulatory Visit: Payer: Self-pay | Admitting: Family Medicine

## 2020-12-12 DIAGNOSIS — R002 Palpitations: Secondary | ICD-10-CM | POA: Diagnosis not present

## 2020-12-12 DIAGNOSIS — R000 Tachycardia, unspecified: Secondary | ICD-10-CM | POA: Diagnosis not present

## 2020-12-17 ENCOUNTER — Ambulatory Visit: Payer: Medicare Other | Admitting: Podiatry

## 2020-12-24 ENCOUNTER — Other Ambulatory Visit: Payer: Self-pay | Admitting: Internal Medicine

## 2020-12-25 NOTE — Telephone Encounter (Signed)
This is a [place] pt.  °

## 2021-01-02 ENCOUNTER — Ambulatory Visit (INDEPENDENT_AMBULATORY_CARE_PROVIDER_SITE_OTHER): Payer: Medicare Other | Admitting: Podiatry

## 2021-01-02 ENCOUNTER — Other Ambulatory Visit: Payer: Self-pay

## 2021-01-02 DIAGNOSIS — L989 Disorder of the skin and subcutaneous tissue, unspecified: Secondary | ICD-10-CM

## 2021-01-02 DIAGNOSIS — E669 Obesity, unspecified: Secondary | ICD-10-CM

## 2021-01-02 DIAGNOSIS — E1169 Type 2 diabetes mellitus with other specified complication: Secondary | ICD-10-CM | POA: Diagnosis not present

## 2021-01-06 ENCOUNTER — Telehealth: Payer: Self-pay | Admitting: *Deleted

## 2021-01-06 NOTE — Progress Notes (Signed)
  Subjective:  Patient ID: Tracey [name], female    DOB: 1955-07-12,  MRN: [id number]  Chief Complaint  Patient presents with   lesion     Urgent Work In-plan for surgery end of September  for mass of right ankle    65 y.o. female returns for follow-up with the above complaint. History confirmed with patient.  Right ankle soft tissue mass.  She like to plan for excision.  Says her blood sugar has been improved  Objective:  Physical Exam: warm, good capillary refill, no trophic changes or ulcerative lesions, normal DP and PT pulses, and normal sensory exam. Right Foot: Right lateral ankle there is a benign-appearing skin lesion that does appear to be inflamed consistent with a dermatofibroma or skin tag  Assessment:   1. Benign skin lesion   2. Diabetes mellitus type 2 in obese [facility])      Plan:  Patient was evaluated and treated and all questions answered.  Her endocrinology did not order new A1c but she says her most recent blood sugars have improved quite a bit so I do expect it to be below 8%.  I did order her a new A1c today.  We discussed the risk of hypoglycemia in relation to excision of the benign soft tissue mass including wound complications wound dehiscence infection and risk of amputation if any of these develop.  She understands the risk and wishes to proceed.  Informed consent was signed and she is scheduled for surgery next Friday.  She does have a family history of DVT and we will plan for 5 days of Xarelto following surgery and then back to her 81 mg aspirin  Return for after surgery.

## 2021-01-06 NOTE — Telephone Encounter (Signed)
Patient is calling and wanting to know if she is to discontinue her Blood pressure medicine before her upcoming surgery on Sept. 30th? Please advise.

## 2021-01-07 NOTE — Telephone Encounter (Signed)
Returned the call to patient, no answer, left vmessage per Dr Maxie Barb instructions to continue w/ medications.

## 2021-01-10 ENCOUNTER — Other Ambulatory Visit: Payer: Self-pay | Admitting: Podiatry

## 2021-01-10 DIAGNOSIS — D492 Neoplasm of unspecified behavior of bone, soft tissue, and skin: Secondary | ICD-10-CM | POA: Diagnosis not present

## 2021-01-10 DIAGNOSIS — D2371 Other benign neoplasm of skin of right lower limb, including hip: Secondary | ICD-10-CM | POA: Diagnosis not present

## 2021-01-10 DIAGNOSIS — L980 Pyogenic granuloma: Secondary | ICD-10-CM | POA: Diagnosis not present

## 2021-01-10 DIAGNOSIS — D1801 Hemangioma of skin and subcutaneous tissue: Secondary | ICD-10-CM | POA: Diagnosis not present

## 2021-01-10 MED ORDER — RIVAROXABAN 10 MG PO TABS: 10.0000 mg | ORAL_TABLET | Freq: Every day | ORAL | 0 refills | Status: DC

## 2021-01-10 MED ORDER — ACETAMINOPHEN-CODEINE #3 300-30 MG PO TABS: 1.0000 | ORAL_TABLET | ORAL | 0 refills | Status: DC | PRN

## 2021-01-16 ENCOUNTER — Other Ambulatory Visit: Payer: Self-pay

## 2021-01-16 ENCOUNTER — Ambulatory Visit (INDEPENDENT_AMBULATORY_CARE_PROVIDER_SITE_OTHER): Payer: Medicare Other | Admitting: Podiatry

## 2021-01-16 ENCOUNTER — Encounter: Payer: Self-pay | Admitting: Podiatry

## 2021-01-16 DIAGNOSIS — R2241 Localized swelling, mass and lump, right lower limb: Secondary | ICD-10-CM

## 2021-01-16 DIAGNOSIS — Z9889 Other specified postprocedural states: Secondary | ICD-10-CM

## 2021-01-16 DIAGNOSIS — L989 Disorder of the skin and subcutaneous tissue, unspecified: Secondary | ICD-10-CM

## 2021-01-17 ENCOUNTER — Encounter: Payer: Self-pay | Admitting: Podiatry

## 2021-01-20 NOTE — Progress Notes (Signed)
  Subjective:  Patient ID: Tracey [name], female    DOB: [birth year],  MRN: [id number]  Chief Complaint  Patient presents with   Routine Post Op    POV #1 DOS 01/10/2021 EXCISION OF SOFT TISSUE MASS     65 y.o. female returns for post-op check.  Overall doing well  Review of Systems: Negative except as noted in the HPI. Denies N/V/F/Ch.   Objective:  There were no vitals filed for this visit. There is no height or weight on file to calculate BMI. Constitutional Well developed. Well nourished.  Vascular Foot warm and well perfused. Capillary refill normal to all digits.  She does have some increased swelling on the right side compared to the left.  Negative Homan test no calf pain   Neurologic Normal speech. Oriented to person, place, and time. Epicritic sensation to light touch grossly present bilaterally.  Dermatologic Skin healing well without signs of infection. Skin edges well coapted without signs of infection.  Orthopedic: Tenderness to palpation noted about the surgical site.   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 Localized swelling of right lower leg    Plan:  Patient was evaluated and treated and all questions answered.  S/p foot surgery right -Overall doing well sutures will be removed at next visit she can continue WBAT in the CAM boot -May begin bathing -She does have some increased swelling she did not have any signs or symptoms of DVT but considering her previous history I am ordering a venous ultrasound for this side to be have evaluated  Return in about 2 weeks (around 01/30/2021) for suture removal, post op (no x-rays).

## 2021-01-29 ENCOUNTER — Ambulatory Visit: Payer: Medicare Other | Admitting: Family Medicine

## 2021-01-30 ENCOUNTER — Other Ambulatory Visit: Payer: Self-pay

## 2021-01-30 ENCOUNTER — Ambulatory Visit (INDEPENDENT_AMBULATORY_CARE_PROVIDER_SITE_OTHER): Payer: Medicare Other | Admitting: Podiatry

## 2021-01-30 DIAGNOSIS — L989 Disorder of the skin and subcutaneous tissue, unspecified: Secondary | ICD-10-CM

## 2021-02-02 NOTE — Progress Notes (Signed)
  Subjective:  Patient ID: Tracey [name], female    DOB: Jan 22, 1956,  MRN: [id number]  Chief Complaint  Patient presents with   Routine Post Op       POV #2 DOS 01/10/2021 EXCISION OF SOFT TISSUE MASS     65 y.o. female returns for post-op check.  Overall doing well  Review of Systems: Negative except as noted in the HPI. Denies N/V/F/Ch.   Objective:  There were no vitals filed for this visit. There is no height or weight on file to calculate BMI. Constitutional Well developed. Well nourished.  Vascular Foot warm and well perfused. Capillary refill normal to all digits.  She does have some increased swelling on the right side compared to the left.  Negative Homan test no calf pain   Neurologic Normal speech. Oriented to person, place, and time. Epicritic sensation to light touch grossly present bilaterally.  Dermatologic Skin healing well without signs of infection. Skin edges well coapted without signs of infection.  Orthopedic: Tenderness to palpation noted about the surgical site.   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  Plan:  Patient was evaluated and treated and all questions answered.  S/p foot surgery right -Doing well sutures removed she can continue regular bathing does not need dressings and regular shoe gear.  Return to see me as needed for this or other issues  Return if symptoms worsen or fail to improve.

## 2021-02-04 ENCOUNTER — Ambulatory Visit: Payer: Medicare Other | Admitting: Nurse Practitioner

## 2021-02-06 ENCOUNTER — Telehealth: Payer: Self-pay

## 2021-02-06 NOTE — Telephone Encounter (Signed)
Received a fax from:  [facility],  [address] [place], [place] [address] Phone: [phone] Fax: [phone]  I called the patient to verify that she is using this company and her CGM, patient stated that she does not use her CGM and she started getting packages from this company and does not know why they are sending to her because she doesn't know them and doesn't use supplies. Patient asked how to stop them from sending to her. I gave her the number and will respond to the fax also to discontinue. Patient verbalized an understanding and thanked me.

## 2021-02-07 ENCOUNTER — Ambulatory Visit (INDEPENDENT_AMBULATORY_CARE_PROVIDER_SITE_OTHER): Payer: Medicare Other | Admitting: Family Medicine

## 2021-02-07 ENCOUNTER — Encounter: Payer: Self-pay | Admitting: Family Medicine

## 2021-02-07 ENCOUNTER — Other Ambulatory Visit: Payer: Self-pay

## 2021-02-07 VITALS — Ht 70.0 in | Wt 382.0 lb

## 2021-02-07 DIAGNOSIS — E1169 Type 2 diabetes mellitus with other specified complication: Secondary | ICD-10-CM

## 2021-02-07 DIAGNOSIS — E669 Obesity, unspecified: Secondary | ICD-10-CM | POA: Diagnosis not present

## 2021-02-07 DIAGNOSIS — K219 Gastro-esophageal reflux disease without esophagitis: Secondary | ICD-10-CM

## 2021-02-07 NOTE — Assessment & Plan Note (Addendum)
Uncontrolled, behavior modification  and twice daily omeprazole x 1 week, also work on weight loss

## 2021-02-07 NOTE — Progress Notes (Signed)
Virtual Visit via Telephone Note  I connected with Tracey [name] on 02/07/21 at  4:20 PM EDT by telephone and verified that I am speaking with the correct person using two identifiers.  Location: Patient: home Provider: office   I discussed the limitations, risks, security and privacy concerns of performing an evaluation and management service by telephone and the availability of in person appointments. I also discussed with the patient that there may be a patient responsible charge related to this service. The patient expressed understanding and agreed to proceed.   History of Present Illness:   2 week h/o increased heartburn. reports drinking a lot of sodas, and eating late at night Observations/Objective: Ht 5\' 10"  (1.778 m)   Wt (!) 382 lb (173.3 kg)   BMI 54.81 kg/m  Good communication with no confusion and intact memory. Alert and oriented x 3 No signs of respiratory distress during speech   Assessment and Plan: GERD Uncontrolled, behavior modification  and twice daily omeprazole x 1 week, also work on weight loss  Morbid obesity  Patient re-educated about  the importance of commitment to a  minimum of 150 minutes of exercise per week as able.  The importance of healthy food choices with portion control discussed, as well as eating regularly and within a 12 hour window most days. The need to choose "clean , green" food 50 to 75% of the time is discussed, as well as to make water the primary drink and set a goal of 64 ounces water daily.    Weight /BMI 02/07/2021 12/05/2020 12/05/2020  WEIGHT 382 lb 391 lb 12.8 oz 392 lb  HEIGHT 5\' 10"  5\' 10"  5\' 10"   BMI 54.81 kg/m2 56.22 kg/m2 56.25 kg/m2      Diabetes mellitus type 2 in obese [facility]) Tracey [name] is reminded of the importance of commitment to daily physical activity for 30 minutes or more, as able and the need to limit carbohydrate intake to 30 to 60 grams per meal to help with blood sugar control.   The need to  take medication as prescribed, test blood sugar as directed, and to call between visits if there is a concern that blood sugar is uncontrolled is also discussed.   Tracey [name] is reminded of the importance of daily foot exam, annual eye examination, and good blood sugar, blood pressure and cholesterol control. Uncontrolled, managed by Endo, advised to change diet to improve both diabetes and GERD  Diabetic Labs Latest Ref Rng & Units 12/05/2020 10/31/2020 07/15/2020 07/08/2020 02/01/2020  HbA1c 4.0 - 5.6 % - 8.0(A) 7.0(A) - -  Microalbumin mg/dL - - - - -  Micro/Creat Ratio <30 mcg/mg creat - - - - -  Chol 100 - 199 mg/dL 239(H) - - - 133  HDL >39 mg/dL 42 - - - 38(L)  Calc LDL 0 - 99 mg/dL 174(H) - - - 75  Triglycerides 0 - 149 mg/dL 128 - - - 117  Creatinine 0.57 - 1.00 mg/dL 0.85 - - 0.84 -   BP/Weight 02/07/2021 12/05/2020 12/05/2020 11/25/2020 10/31/2020 06/16/5730 2/0/2542  Systolic BP - 706 237 628 315 176 160  Diastolic BP - 81 70 74 82 84 98  Wt. (Lbs) 382 391.8 392 394 397.8 392 393  BMI 54.81 56.22 56.25 56.53 57.08 56.25 56.39   Foot/eye exam completion dates Latest Ref Rng & Units 02/13/2019 08/19/2016  Eye Exam No Retinopathy - No Retinopathy  Foot Form Completion - Done -  Follow Up Instructions:    I discussed the assessment and treatment plan with the patient. The patient was provided an opportunity to ask questions and all were answered. The patient agreed with the plan and demonstrated an understanding of the instructions.   The patient was advised to call back or seek an in-person evaluation if the symptoms worsen or if the condition fails to improve as anticipated.  I provided  12 minutes of non-face-to-face time during this encounter.   Tula Nakayama, MD

## 2021-02-07 NOTE — Patient Instructions (Addendum)
Please schedule in office visit for mid March, call if you need me sooner  Increase omeprazole to  20 mg TWO daily for 1 week, then go back to one daily  Please stop eating at 8 pm  Please stop sodas , these make reflux symptoms uncontrolled , also make diabetes uncontrolled and make heart failure uncontrolled  It is important that you exercise regularly at least 30 minutes 5 times a week. If you develop chest pain, have severe difficulty breathing, or feel very tired, stop exercising immediately and seek medical attention  Think about what you will eat, plan ahead. Choose " clean, green, fresh or frozen" over canned, processed or packaged foods which are more sugary, salty and fatty. 70 to 75% of food eaten should be vegetables and fruit. Three meals at set times with snacks allowed between meals, but they must be fruit or vegetables. Aim to eat over a 12 hour period , example 7 am to 7 pm, and STOP after  your last meal of the day. Drink water,generally about 64 ounces per day, no other drink is as healthy. Fruit juice is best enjoyed in a healthy way, by EATING the fruit.   Thanks for choosing [facility], we consider it a privelige to serve you.

## 2021-02-09 ENCOUNTER — Encounter: Payer: Self-pay | Admitting: Family Medicine

## 2021-02-09 NOTE — Assessment & Plan Note (Addendum)
Tracey [name] is reminded of the importance of commitment to daily physical activity for 30 minutes or more, as able and the need to limit carbohydrate intake to 30 to 60 grams per meal to help with blood sugar control.   The need to take medication as prescribed, test blood sugar as directed, and to call between visits if there is a concern that blood sugar is uncontrolled is also discussed.   Tracey [name] is reminded of the importance of daily foot exam, annual eye examination, and good blood sugar, blood pressure and cholesterol control. Uncontrolled, managed by Endo, advised to change diet to improve both diabetes and GERD  Diabetic Labs Latest Ref Rng & Units 12/05/2020 10/31/2020 07/15/2020 07/08/2020 02/01/2020  HbA1c 4.0 - 5.6 % - 8.0(A) 7.0(A) - -  Microalbumin mg/dL - - - - -  Micro/Creat Ratio <30 mcg/mg creat - - - - -  Chol 100 - 199 mg/dL 239(H) - - - 133  HDL >39 mg/dL 42 - - - 38(L)  Calc LDL 0 - 99 mg/dL 174(H) - - - 75  Triglycerides 0 - 149 mg/dL 128 - - - 117  Creatinine 0.57 - 1.00 mg/dL 0.85 - - 0.84 -   BP/Weight 02/07/2021 12/05/2020 12/05/2020 11/25/2020 10/31/2020 08/14/2704 05/16/7626  Systolic BP - 315 176 160 737 106 269  Diastolic BP - 81 70 74 82 84 98  Wt. (Lbs) 382 391.8 392 394 397.8 392 393  BMI 54.81 56.22 56.25 56.53 57.08 56.25 56.39   Foot/eye exam completion dates Latest Ref Rng & Units 02/13/2019 08/19/2016  Eye Exam No Retinopathy - No Retinopathy  Foot Form Completion - Done -

## 2021-02-09 NOTE — Assessment & Plan Note (Signed)
  Patient re-educated about  the importance of commitment to a  minimum of 150 minutes of exercise per week as able.  The importance of healthy food choices with portion control discussed, as well as eating regularly and within a 12 hour window most days. The need to choose "clean , green" food 50 to 75% of the time is discussed, as well as to make water the primary drink and set a goal of 64 ounces water daily.    Weight /BMI 02/07/2021 12/05/2020 12/05/2020  WEIGHT 382 lb 391 lb 12.8 oz 392 lb  HEIGHT 5\' 10"  5\' 10"  5\' 10"   BMI 54.81 kg/m2 56.22 kg/m2 56.25 kg/m2

## 2021-02-12 ENCOUNTER — Ambulatory Visit: Payer: Medicare Other | Admitting: Nurse Practitioner

## 2021-02-12 NOTE — Patient Instructions (Signed)

## 2021-02-12 NOTE — Patient Instructions (Incomplete)

## 2021-02-13 ENCOUNTER — Encounter: Payer: Self-pay | Admitting: Nurse Practitioner

## 2021-02-13 ENCOUNTER — Ambulatory Visit (INDEPENDENT_AMBULATORY_CARE_PROVIDER_SITE_OTHER): Payer: Medicare Other | Admitting: Nurse Practitioner

## 2021-02-13 VITALS — BP 164/67 | HR 114 | Ht 70.0 in | Wt 379.6 lb

## 2021-02-13 DIAGNOSIS — E559 Vitamin D deficiency, unspecified: Secondary | ICD-10-CM

## 2021-02-13 DIAGNOSIS — I10 Essential (primary) hypertension: Secondary | ICD-10-CM

## 2021-02-13 DIAGNOSIS — E1165 Type 2 diabetes mellitus with hyperglycemia: Secondary | ICD-10-CM

## 2021-02-13 DIAGNOSIS — E782 Mixed hyperlipidemia: Secondary | ICD-10-CM | POA: Diagnosis not present

## 2021-02-13 LAB — POCT GLYCOSYLATED HEMOGLOBIN (HGB A1C): HbA1c, POC (controlled diabetic range): 7.7 % — AB (ref 0.0–7.0)

## 2021-02-13 MED ORDER — OZEMPIC (0.25 OR 0.5 MG/DOSE) 2 MG/1.5ML ~~LOC~~ SOPN: 0.5000 mg | PEN_INJECTOR | SUBCUTANEOUS | 2 refills | Status: DC

## 2021-02-13 NOTE — Progress Notes (Signed)
02/13/2021, 1:45 PM   Endocrinology Follow Up Visit    Subjective:    [name] ID: Tracey [name], female    DOB: [birth year].  Tracey [name] is being engaged in follow-up for management of currently uncontrolled symptomatic type 2 diabetes, hyperlipidemia, hypertension.    PMD:   Tracey Helper, MD.  Past Medical History:  Diagnosis Date   Arthritis    Diabetes mellitus without complication ([place])    Diastolic dysfunction 07/4965   Grade 1   GERD (gastroesophageal reflux disease)    Hyperlipidemia    Hyperlipidemia    Hypertension 04/15/2012   Morbid obesity ([place])    PE (pulmonary embolism)    Pulmonary emboli ([place]) 1999   post TAH   Sleep apnea    Past Surgical History:  Procedure Laterality Date   ABDOMINAL HYSTERECTOMY  1999   , totalfor fibroids   CESAREAN SECTION     CHOLECYSTECTOMY  1996   COLONOSCOPY N/A 10/31/2015   Procedure: COLONOSCOPY;  Surgeon: Tracey Houston, MD;  Location: [facility];  Service: Endoscopy;  Laterality: N/A;  930   FOOT SURGERY Right    October 2022   Social History   Socioeconomic History   Marital status: Single    Spouse name: Not on file   Number of children: Not on file   Years of education: Not on file   Highest education level: Not on file  Occupational History   Not on file  Tobacco Use   Smoking status: Never   Smokeless tobacco: Never  Vaping Use   Vaping Use: Never used  Substance and Sexual Activity   Alcohol use: No    Alcohol/week: 0.0 standard drinks   Drug use: No   Sexual activity: Not Currently  Other Topics Concern   Not on file  Social History Narrative   Not on file   Social Determinants of Health   Financial Resource Strain: Low Risk    Difficulty of Paying Living Expenses: Not hard at all  Food Insecurity: No Food Insecurity   Worried About Charity fundraiser in [place]  Last Year: Never true   [place] in [place] Last Year: Never true  Transportation Needs: No Transportation Needs   Lack of Transportation (Medical): No   Lack of Transportation (Non-Medical): No  Physical Activity: Inactive   Days of Exercise per Week: 0 days   Minutes of Exercise per Session: 0 min  Stress: No Stress Concern Present   Feeling of Stress : Not at all  Social Connections: Moderately Isolated   Frequency of Communication with Friends and Family: More than three times a week   Frequency of Social Gatherings with Friends and Family: More than three times a week   Attends Religious Services: 1 to 4 times per year   Active Member of [employer] or Organizations: No   Attends Archivist Meetings: Never   Marital Status: Divorced  Outpatient Encounter Medications as of 02/13/2021  Medication Sig   acetaminophen (TYLENOL) 500 MG tablet Take 500 mg by mouth every 6 (six) hours as needed for moderate pain.   aspirin EC 81 MG tablet Take 1 tablet (81 mg total) by mouth daily.   blood glucose meter kit and supplies KIT Dispense based on [name] and insurance preference. Use up to four times daily as directed.   Cholecalciferol (VITAMIN D-3) 125 MCG (5000 UT) TABS Take 5,000 Units by mouth daily.   glipiZIDE (GLUCOTROL XL) 10 MG 24 hr tablet TAKE ONE TABLET (10MG TOTAL) BY MOUTH DAILY WITH BREAKFAST   glucose blood test strip 1 each by Other route 4 (four) times daily. Use as instructed   insulin regular human CONCENTRATED (HUMULIN R U-500 KWIKPEN) 500 UNIT/ML KwikPen INJECT 55 UNITS INTO Tracey SKIN 3 TIMES DAILY WITH MEALS.   Lancets MISC Use to test BG qid. Dx : E11.65. Softclix Lancets   metFORMIN (GLUCOPHAGE) 1000 MG tablet TAKE ONE TABLET BY MOUTH TWICE A DAY WITH A MEAL   metoprolol tartrate (LOPRESSOR) 50 MG tablet TAKE ONE TABLET (50MG TOTAL) BY MOUTH TWO TIMES DAILY   omeprazole (PRILOSEC) 20 MG capsule TAKE ONE CAPSULE BY MOUTH DAILY   rosuvastatin (CRESTOR) 20 MG  tablet Take 1 tablet (20 mg total) by mouth daily.   Semaglutide,0.25 or 0.5MG/DOS, (OZEMPIC, 0.25 OR 0.5 MG/DOSE,) 2 MG/1.5ML SOPN Inject 0.5 mg into [place]acey skin once a week.   spironolactone (ALDACTONE) 25 MG tablet TAKE ONE (1) TABLET BY MOUTH EVERY DAY   UNIFINE PENTIPS 31G X 8 MM MISC USE FOR INSULIN INJECTIONS THREE TIMES DAILY.   No facility-administered encounter medications on file as of 02/13/2021.    ALLERGIES: Allergies  Allergen Reactions   Haloperidol Lactate Anaphylaxis   Lipitor [Atorvastatin Calcium] Other (See Comments)    Markedly elevated liver enzymes    VACCINATION STATUS: Immunization History  Administered Date(s) Administered   Influenza,inj,Quad PF,6+ Mos 04/01/2015, 03/16/2016, 01/20/2017, 04/11/2018, 02/13/2019, 01/31/2020, 12/05/2020   Moderna Sars-Covid-2 Vaccination 12/13/2019, 01/11/2020   PNEUMOCOCCAL CONJUGATE-20 12/05/2020   Pneumococcal Polysaccharide-23 05/13/2015    Diabetes She presents for her follow-up diabetic visit. She has type 2 diabetes mellitus. Onset time: He was diagnosed at approximate age of 60 years. Her disease course has been improving. There are no hypoglycemic associated symptoms. Pertinent negatives for hypoglycemia include no confusion, headaches, pallor or seizures. Associated symptoms include fatigue. Pertinent negatives for diabetes include no blurred vision, no chest pain, no polydipsia, no polyphagia, no polyuria and no weight loss. There are no hypoglycemic complications. Symptoms are stable. Diabetic complications include heart disease and nephropathy. Risk factors for coronary artery disease include diabetes mellitus, dyslipidemia, family history, hypertension, obesity, post-menopausal and sedentary lifestyle. Current diabetic treatment includes intensive insulin program and oral agent (dual therapy). She is compliant with treatment most of Tracey time. Her weight is decreasing steadily. She is following a generally unhealthy diet.  When asked about meal planning, she reported none. She has had a previous visit with a dietitian. She never participates in exercise. Her home blood glucose trend is decreasing steadily. (She presents today with more consistent glucose monitoring and better glucose readings.  Her POCT A1c today is 7.7%, improving from last visit of 8%.  She is trying hard to eat Tracey right things.  Analysis of her meter shows 7-day average of 158 with 10 readings; 14-day average of 156 with 26 readings; 30-day average of 161 with 51 readings, and 90-day average of 153  with 161 readings.  She does have a CGM device at home which I previously ordered, she has not yet taken Tracey initiative for [place] to help her apply it.  She denies any significant hypoglycemia.) An ACE inhibitor/angiotensin II receptor blocker is not being taken. She does not see a podiatrist.Eye exam is not current.  Hyperlipidemia This is a chronic problem. Tracey current episode started more than 1 year ago. Tracey problem is uncontrolled. Recent lipid tests were reviewed and are high. Exacerbating diseases include chronic renal disease, diabetes and obesity. Factors aggravating her hyperlipidemia include fatty foods. Pertinent negatives include no chest pain, myalgias or shortness of breath. Current antihyperlipidemic treatment includes statins. Tracey current treatment provides moderate improvement of lipids. Compliance problems include adherence to diet and adherence to exercise.  Risk factors for coronary artery disease include diabetes mellitus, dyslipidemia, hypertension, obesity, a sedentary lifestyle and post-menopausal.  Hypertension This is a chronic problem. Tracey current episode started more than 1 year ago. Tracey problem is unchanged. Tracey problem is uncontrolled. Pertinent negatives include no blurred vision, chest pain, headaches, palpitations or shortness of breath. There are no associated agents to hypertension. Risk factors for coronary artery disease include  dyslipidemia, diabetes mellitus, obesity, sedentary lifestyle and post-menopausal state. Past treatments include beta blockers and diuretics. Tracey current treatment provides moderate improvement. Compliance problems include exercise and diet.  Hypertensive end-organ damage includes kidney disease. Identifiable causes of hypertension include chronic renal disease.    Review of systems  Constitutional: + steadily decreasing body weight,  current Body mass index is 54.47 kg/m. , + fatigue, no subjective hyperthermia, no subjective hypothermia Eyes: no blurry vision, no xerophthalmia ENT: no sore throat, no nodules palpated in throat, no dysphagia/odynophagia, no hoarseness Cardiovascular: no chest pain, + shortness of breath with activity, no palpitations, no leg swelling Respiratory: no cough, no shortness of breath Gastrointestinal: no nausea/vomiting/diarrhea Musculoskeletal: no muscle/joint aches Skin: no rashes, no hyperemia,  Neurological: no tremors, no numbness, no tingling, no dizziness Psychiatric: no depression, no anxiety    Objective:    BP (!) 164/67   Pulse (!) 114   Ht 5' 10"  (1.778 m)   Wt (!) 379 lb 9.6 oz (172.2 kg)   BMI 54.47 kg/m   Wt Readings from Last 3 Encounters:  02/13/21 (!) 379 lb 9.6 oz (172.2 kg)  02/07/21 (!) 382 lb (173.3 kg)  12/05/20 (!) 392 lb (177.8 kg)     BP Readings from Last 3 Encounters:  02/13/21 (!) 164/67  12/05/20 140/70  12/05/20 (!) 145/81     Physical Exam- Limited  Constitutional:  Body mass index is 54.47 kg/m. , not in acute distress, normal state of mind Eyes:  EOMI, no exophthalmos Neck: Supple Cardiovascular: RRR, no murmurs, rubs, or gallops, no edema Respiratory: Adequate breathing efforts, no crackles, rales, rhonchi, or wheezing Musculoskeletal: no gross deformities, strength intact in all four extremities, no gross restriction of joint movements Skin:  no rashes, no hyperemia Neurological: no tremor with  outstretched hands    CMP     Component Value Date/Time   NA 139 12/05/2020 1056   K 4.2 12/05/2020 1056   CL 99 12/05/2020 1056   CO2 18 (L) 12/05/2020 1056   GLUCOSE 172 (H) 12/05/2020 1056   GLUCOSE 148 (H) 12/21/2019 1252   BUN 11 12/05/2020 1056   CREATININE 0.85 12/05/2020 1056   CREATININE 0.71 12/21/2019 1252   CALCIUM 9.4 12/05/2020 1056   PROT 7.4 12/05/2020 1056   ALBUMIN 4.2 12/05/2020  1056   AST 37 12/05/2020 1056   ALT 32 12/05/2020 1056   ALKPHOS 64 12/05/2020 1056   BILITOT 0.3 12/05/2020 1056   GFRNONAA 77 02/13/2019 1207   GFRAA 90 02/13/2019 1207    Diabetic Labs (most recent): Lab Results  Component Value Date   HGBA1C 7.7 (A) 02/13/2021   HGBA1C 8.0 (A) 10/31/2020   HGBA1C 7.0 (A) 07/15/2020    Lipid Panel     Component Value Date/Time   CHOL 239 (H) 12/05/2020 1056   TRIG 128 12/05/2020 1056   HDL 42 12/05/2020 1056   CHOLHDL 5.7 (H) 12/05/2020 1056   CHOLHDL 3.5 02/01/2020 1415   VLDL 23 07/13/2016 1430   LDLCALC 174 (H) 12/05/2020 1056   LDLCALC 75 02/01/2020 1415      Lab Results  Component Value Date   TSH 0.727 12/05/2020   TSH 0.69 02/13/2019   TSH 1.23 05/09/2018   TSH 0.95 07/13/2016   TSH 1.26 07/10/2015   TSH 1.382 11/17/2013   TSH 0.519 04/14/2012   TSH 1.525 11/07/2009      Lipid Panel     Component Value Date/Time   CHOL 239 (H) 12/05/2020 1056   TRIG 128 12/05/2020 1056   HDL 42 12/05/2020 1056   CHOLHDL 5.7 (H) 12/05/2020 1056   CHOLHDL 3.5 02/01/2020 1415   VLDL 23 07/13/2016 1430   LDLCALC 174 (H) 12/05/2020 1056   Greasy 75 02/01/2020 1415     Assessment & Plan:   1) Uncontrolled type 2 diabetes mellitus with hyperglycemia ([place])  - Tracey [name] has currently uncontrolled symptomatic type 2 DM since 65 years of age.  -She is now on insulin U500.    She presents today with more consistent glucose monitoring and better glucose readings.  Her POCT A1c today is 7.7%, improving from last visit  of 8%.  She is trying hard to eat Tracey right things.  Analysis of her meter shows 7-day average of 158 with 10 readings; 14-day average of 156 with 26 readings; 30-day average of 161 with 51 readings, and 90-day average of 153 with 161 readings.  She does have a CGM device at home which I previously ordered, she has not yet taken Tracey initiative for [place] to help her apply it.  She denies any significant hypoglycemia.  -her diabetes is complicated by obesity/sedentary life and Tracey [name] remains at a high risk for more acute and chronic complications which include CAD, CVA, CKD, retinopathy, and neuropathy. These are all discussed in detail with Tracey [name].  - Nutritional counseling repeated at each appointment due to patients tendency to fall back in to old habits.  - Tracey [name] admits there is a room for improvement in their diet and drink choices. -  Suggestion is made for Tracey [name] to avoid simple carbohydrates from their diet including Cakes, Sweet Desserts / Pastries, Ice Cream, Soda (diet and regular), Sweet Tea, Candies, Chips, Cookies, Sweet Pastries, Store Bought Juices, Alcohol in Excess of 1-2 drinks a day, Artificial Sweeteners, Coffee Creamer, and "Sugar-free" Products. This will help [name] to have stable blood glucose profile and potentially avoid unintended weight gain.   - I encouraged Tracey [name] to switch to unprocessed or minimally processed complex starch and increased protein intake (animal or plant source), fruits, and vegetables.   - [name] is advised to stick to a routine mealtimes to eat 3 meals a day and avoid unnecessary snacks (to snack only to correct hypoglycemia).  - I have approached  her with Tracey following individualized plan to manage diabetes and [name] agrees:   -There is a lack of engagement inhibiting optimal management of her diabetes to target.  She continues to have inconsistent monitoring pattern.  -Based on her improved glycemic profile overall,  she is advised to continue her dose of U-500 at 55 units TID with meals if glucose is above 90 and she is eating.  She is advised to continue Metformin 1000 mg po twice daily with meals, and Glipizide 10 mg XL daily with breakfast.  I discussed and initiated Ozempic 0.25 mg SQ weekly x 2 weeks (sample provided), then if tolerated well, she can advance to 0.5 mg SQ weekly thereafter.  This will hopefully help her lose weight and be able to back off on her insulin some.  -Encouraged [name] to monitor blood glucose 4 times per day, before meals and at bedtime and notify Tracey clinic if blood glucose levels are less than 70 or greater than 300 for 3 tests in a row.   She is advised to bring her CGM to her next office visit.  -She is also advised not to inject insulin without proper glucose monitoring.  2) BP/HTN:  Her blood pressure is still not controlled to target but is improving.  She typically waits to take her Spironolactone 25 mg until she gets home so she won't have to urinate as often.  She is advised to continue Metoprolol 50 mg po twice daily and continue Spironolactone 25 mg po daily.    3) Lipids/HPL:  Her recent lipid panel from 12/21/19 shows controlled LDL of 75.  She is advised to Continue Crestor 20 mg po daily at bedtime.  Side effects and precautions discussed with her.  Due for lipid recheck soon (ordered by PCP).  4) Vitamin D Deficiency: Her recent vitamin D level on 07/08/20 was 33.1.  She has completed replenishment with ergocalciferol and is now taking OTC Vitamin D3 5000 units daily as maintenance dose.  She is advised to continue for now.    5) Chronic Care/Health Maintenance: -she is on Statin medications and is encouraged to continue to follow up with Ophthalmology, Dentist, Podiatrist at least yearly or according to recommendations, and advised to stay away from smoking. I have recommended yearly flu vaccine and pneumonia vaccination at least every 5 years; moderate intensity  exercise for up to 150 minutes weekly; and  sleep for at least 7 hours a day.  - I advised [name] to maintain close follow up with Tracey Helper, MD for primary care needs.     I spent 50 minutes in Tracey care of Tracey [name] today including review of labs from [facility], Lipids, Thyroid Function, Hematology (current and previous including abstractions from other facilities); face-to-face time discussing  her blood glucose readings/logs, discussing hypoglycemia and hyperglycemia episodes and symptoms, medications doses, her options of short and long term treatment based on Tracey latest standards of care / guidelines;  discussion about incorporating lifestyle medicine;  and documenting Tracey encounter.    Please refer to [name] Instructions for Blood Glucose Monitoring and Insulin/Medications Dosing Guide"  in media tab for additional information. Please  also refer to " [name] Self Inventory" in Tracey Media  tab for reviewed elements of pertinent [name] history.  Duane Boston participated in Tracey discussions, expressed understanding, and voiced agreement with Tracey above plans.  All questions were answered to her satisfaction. she is encouraged to contact clinic should she have any questions or concerns prior  to her return visit.   Follow up plan: - Return in about 3 months (around 05/16/2021) for Diabetes F/U with A1c in office, No previsit labs, Bring meter and logs.   Rayetta Pigg, [facility] [address] [place], [place] [address] Phone: [phone] Fax: [phone]  02/13/2021, 1:45 PM

## 2021-02-18 DIAGNOSIS — Z20822 Contact with and (suspected) exposure to covid-19: Secondary | ICD-10-CM | POA: Diagnosis not present

## 2021-02-19 ENCOUNTER — Other Ambulatory Visit: Payer: Self-pay

## 2021-02-19 ENCOUNTER — Encounter: Payer: Self-pay | Admitting: Podiatry

## 2021-02-19 ENCOUNTER — Ambulatory Visit (INDEPENDENT_AMBULATORY_CARE_PROVIDER_SITE_OTHER): Payer: Medicare Other | Admitting: Podiatry

## 2021-02-19 DIAGNOSIS — B351 Tinea unguium: Secondary | ICD-10-CM | POA: Diagnosis not present

## 2021-02-19 DIAGNOSIS — Z9889 Other specified postprocedural states: Secondary | ICD-10-CM

## 2021-02-19 DIAGNOSIS — M79675 Pain in left toe(s): Secondary | ICD-10-CM

## 2021-02-19 DIAGNOSIS — E1142 Type 2 diabetes mellitus with diabetic polyneuropathy: Secondary | ICD-10-CM | POA: Diagnosis not present

## 2021-02-19 DIAGNOSIS — M79674 Pain in right toe(s): Secondary | ICD-10-CM | POA: Diagnosis not present

## 2021-02-20 ENCOUNTER — Encounter: Payer: Medicare Other | Admitting: Podiatry

## 2021-02-23 NOTE — Progress Notes (Signed)
  Subjective:  Patient ID: Tracey [name], female    DOB: 09/19/1955,  MRN: [id number]  Tracey [name] presents to clinic today for at risk foot care with history of diabetic neuropathy and painful elongated mycotic toenails 1-5 bilaterally which are tender when wearing enclosed shoe gear. Pain is relieved with periodic professional debridement.  Patient did not check blood glucose today.  She did have excision of soft tissue mass performed by Dr. Sherryle Lis and pathology confirmed pyogenic granuloma from right foot. She states she still has some dry skin and discomfort of the surgical site as well as some swelling. She denies any drainage from area. No fever, chills, night sweats, nausea or vomiting. She has been applying Vaseline to incision due to dry skin.       PCP is Tracey Helper, MD , and last visit was 02/07/2021.  Allergies  Allergen Reactions   Haloperidol Lactate Anaphylaxis   Lipitor [Atorvastatin Calcium] Other (See Comments)    Markedly elevated liver enzymes    Review of Systems: Negative except as noted in the HPI. Objective:   Constitutional Tracey [name] is a pleasant 65 y.o. African American female, morbidly obese in NAD. AAO x 3.   Vascular Capillary refill time to digits immediate b/l. Palpable pedal pulses b/l LE. Pedal hair absent. No pain with calf compression b/l. Lower extremity skin temperature gradient within normal limits. Nonpitting edema noted right >left. No cyanosis or clubbing noted.  Neurologic Normal speech. Oriented to person, place, and time. Protective sensation decreased with 10 gram monofilament b/l.  Dermatologic Skin warm and supple b/l lower extremities. No open wounds b/l LE. No interdigital macerations noted b/l LE. Toenails 1-5 b/l elongated, discolored, dystrophic, thickened, crumbly with subungual debris and tenderness to dorsal palpation. Longitudinal scar noted lateral aspect of right foot. Area is now slightly  macerated. No warmth, no erythema, no drainage expressed.  Orthopedic: +Tenderness to palpation of surgical site..   Radiographs: None Assessment:   1. Pain due to onychomycosis of toenails of both feet   2. Post-operative state   3. Diabetic peripheral neuropathy associated with type 2 diabetes mellitus ([place])    Plan:  Patient was evaluated and treated and all questions answered. Consent given for treatment as described below: -Examined patient. -Continue diabetic foot care principles: inspect feet daily, monitor glucose as recommended by PCP and/or Endocrinologist, and follow prescribed diet per PCP, Endocrinologist and/or dietician. -Toenails 1-5 b/l were debrided in length and girth with sterile nail nippers and dremel without iatrogenic bleeding.  -Right foot incision is macerated. I cleansed the surgical site with alcohol and painted with betadine solution. I will reach out to Dr. Sherryle Lis and he will call her with further instructions. -Patient/POA to call should there be question/concern in the interim.  Return in about 3 months (around 05/22/2021).  Marzetta Board, DPM

## 2021-02-27 ENCOUNTER — Telehealth: Payer: Self-pay | Admitting: Nurse Practitioner

## 2021-02-27 MED ORDER — GLUCOSE BLOOD VI STRP: 1.0000 | ORAL_STRIP | Freq: Four times a day (QID) | 5 refills | Status: DC

## 2021-02-27 MED ORDER — ACCU-CHEK GUIDE VI STRP: ORAL_STRIP | 5 refills | Status: DC

## 2021-02-27 NOTE — Telephone Encounter (Signed)
Patient states she is still waiting on a refill for her diabetic testing strips. She said that [facility] has faxed [place] several times. Please advise

## 2021-02-27 NOTE — Addendum Note (Signed)
Addended by: Lavell Luster A on: 02/27/2021 03:06 PM   Modules accepted: Orders

## 2021-02-27 NOTE — Telephone Encounter (Signed)
Rx sent to pt's pharmacy

## 2021-03-24 NOTE — Progress Notes (Deleted)
Cardiology Office Note   Date:  03/24/2021   ID:  Tracey, [name] Jan 28, 1956, MRN [id number]  PCP:  Tracey Helper, MD  Cardiologist:   Tracey Carnes, MD   Pt presents for f/u of HTN and chronic SOB      History of Present Illness: Tracey [name] is a 65 y.o. female with a history of CP, DM, HTN, PE  HL and morbid obesity.  Dobutamine echo in 2014 normal   Echo in 2020 LVEF normal    I last saw the pt in July 2022   BP at time 140s/  Her HR was fast when I saw her  110s   Labs checked     Patient says that with walking in apartment she will get a little SOB   She has to crawl up steps due to unsteadiness, concern of falling  She denies CP       No outpatient medications have been marked as taking for the 03/25/21 encounter (Appointment) with Fay Records, MD.     Allergies:   Haloperidol lactate and Lipitor [atorvastatin calcium]   Past Medical History:  Diagnosis Date   Arthritis    Diabetes mellitus without complication ([place])    Diastolic dysfunction 08/5730   Grade 1   GERD (gastroesophageal reflux disease)    Hyperlipidemia    Hyperlipidemia    Hypertension 04/15/2012   Morbid obesity ([place])    PE (pulmonary embolism)    Pulmonary emboli ([place]) 1999   post TAH   Sleep apnea     Past Surgical History:  Procedure Laterality Date   ABDOMINAL HYSTERECTOMY  1999   , totalfor fibroids   [place]   COLONOSCOPY N/A 10/31/2015   Procedure: COLONOSCOPY;  Surgeon: Rogene Houston, MD;  [facility];  Service: Endoscopy;  Laterality: N/A;  930   FOOT SURGERY Right    October 2022     Social History:  The patient  reports that she has never smoked. She has never used smokeless tobacco. She reports that she does not drink alcohol and does not use drugs.   Family History:  The patient's family history includes Alcohol abuse in her father; Aneurysm in her brother; Arthritis in her sister; Cirrhosis in her  father; Coronary artery disease in her sister; Depression in her mother and son; Diabetes in her mother; Heart attack (age of onset: 57) in her sister; Heart murmur in her son; Hyperlipidemia in her father; Hypertension in her father, mother, and son; Sleep apnea in her sister.    ROS:  Please see the history of present illness. All other systems are reviewed and  Negative to the above problem except as noted.    PHYSICAL EXAM: VS:  There were no vitals taken for this visit.  GEN: Morbidly obese 65 yo in no acute distress  HEENT: normal  Neck: no obvious JVD Cardiac: RRR; no murmurs,,Triv to 1+ LE  edema  Respiratory:  clear to auscultation bilaterally,  GI: soft, Obese Skin: warm and dry, no rash Neuro:  Grossly intact Psych: euthymic mood, full affect   EKG:  EKG is not ordered today.    Lipid Panel    Component Value Date/Time   CHOL 239 (H) 12/05/2020 1056   TRIG 128 12/05/2020 1056   HDL 42 12/05/2020 1056   CHOLHDL 5.7 (H) 12/05/2020 1056   CHOLHDL 3.5 02/01/2020 1415   VLDL 23 07/13/2016 1430  LDLCALC 174 (H) 12/05/2020 1056   [place] 75 02/01/2020 1415      Wt Readings from Last 3 Encounters:  02/13/21 (!) 379 lb 9.6 oz (172.2 kg)  02/07/21 (!) 382 lb (173.3 kg)  12/05/20 (!) 392 lb (177.8 kg)      ASSESSMENT AND PLAN:  1  Tachycardia   Each time the pt has been in clinic she has been mildly tachycardic   EKG with ST    Will get monitor to evaluate 24 hour control      2  Hx of HTN  BP is fair  Follow   Will check BNP    3 Hx of CP   PT denies pain   4  DM   last A1C is 8%      5.   HL     Curr  off of statin         Current medicines are reviewed at length with the patient today.  The patient does not have concerns regarding medicines.  Signed, Tracey Carnes, MD  03/24/2021 10:35 PM    [address], [place], [place]  [address] Phone: [phone]; Fax: [phone]

## 2021-03-25 ENCOUNTER — Ambulatory Visit: Payer: Medicare Other | Admitting: Internal Medicine

## 2021-03-25 ENCOUNTER — Telehealth: Payer: Self-pay

## 2021-03-25 ENCOUNTER — Other Ambulatory Visit: Payer: Self-pay

## 2021-03-25 NOTE — Telephone Encounter (Signed)
This will be done.  Have printed the jury duty excuse- just waiting on Dr [employer]

## 2021-03-25 NOTE — Telephone Encounter (Signed)
Patient called needs the jury duty paperwork done by end of week 12/16 to be able to turn into court her date coming 01.2023.  Please contact patient when forms are done to pick them up

## 2021-04-08 ENCOUNTER — Other Ambulatory Visit: Payer: Self-pay | Admitting: Nurse Practitioner

## 2021-04-28 ENCOUNTER — Other Ambulatory Visit: Payer: Self-pay | Admitting: Family Medicine

## 2021-05-01 ENCOUNTER — Telehealth: Payer: Self-pay | Admitting: Nurse Practitioner

## 2021-05-01 DIAGNOSIS — E1165 Type 2 diabetes mellitus with hyperglycemia: Secondary | ICD-10-CM

## 2021-05-15 MED ORDER — ACCU-CHEK GUIDE VI STRP: ORAL_STRIP | 2 refills | Status: DC

## 2021-05-15 NOTE — Addendum Note (Signed)
Addended by: Ellin Saba on: 05/15/2021 01:49 PM   Modules accepted: Orders

## 2021-05-15 NOTE — Telephone Encounter (Signed)
Pt said that [facility] needs the DX Code for her strips. They told her they have faxed for this information. Please advise.

## 2021-05-15 NOTE — Telephone Encounter (Signed)
Rx sent with pt's diagnosis code included.

## 2021-05-16 ENCOUNTER — Other Ambulatory Visit: Payer: Self-pay

## 2021-05-16 DIAGNOSIS — E1165 Type 2 diabetes mellitus with hyperglycemia: Secondary | ICD-10-CM

## 2021-05-16 MED ORDER — ACCU-CHEK GUIDE VI STRP: ORAL_STRIP | 1 refills | Status: DC

## 2021-05-19 ENCOUNTER — Ambulatory Visit: Payer: Medicare Other | Admitting: Nurse Practitioner

## 2021-05-19 NOTE — Patient Instructions (Incomplete)

## 2021-05-21 NOTE — Patient Instructions (Signed)

## 2021-05-22 ENCOUNTER — Other Ambulatory Visit: Payer: Self-pay

## 2021-05-22 ENCOUNTER — Encounter: Payer: Self-pay | Admitting: Nurse Practitioner

## 2021-05-22 ENCOUNTER — Ambulatory Visit (INDEPENDENT_AMBULATORY_CARE_PROVIDER_SITE_OTHER): Payer: Medicare Other | Admitting: Nurse Practitioner

## 2021-05-22 VITALS — BP 158/71 | HR 92 | Ht 70.0 in | Wt 376.0 lb

## 2021-05-22 DIAGNOSIS — Z7984 Long term (current) use of oral hypoglycemic drugs: Secondary | ICD-10-CM

## 2021-05-22 DIAGNOSIS — E669 Obesity, unspecified: Secondary | ICD-10-CM

## 2021-05-22 DIAGNOSIS — E1165 Type 2 diabetes mellitus with hyperglycemia: Secondary | ICD-10-CM

## 2021-05-22 DIAGNOSIS — Z6841 Body Mass Index (BMI) 40.0 and over, adult: Secondary | ICD-10-CM | POA: Diagnosis not present

## 2021-05-22 DIAGNOSIS — E782 Mixed hyperlipidemia: Secondary | ICD-10-CM | POA: Diagnosis not present

## 2021-05-22 DIAGNOSIS — Z794 Long term (current) use of insulin: Secondary | ICD-10-CM | POA: Diagnosis not present

## 2021-05-22 DIAGNOSIS — I10 Essential (primary) hypertension: Secondary | ICD-10-CM | POA: Diagnosis not present

## 2021-05-22 DIAGNOSIS — E559 Vitamin D deficiency, unspecified: Secondary | ICD-10-CM | POA: Diagnosis not present

## 2021-05-22 LAB — POCT GLYCOSYLATED HEMOGLOBIN (HGB A1C)
HbA1c, POC (controlled diabetic range): 6.5 % (ref 0.0–7.0)
Hemoglobin A1C: 6.5 % — AB (ref 4.0–5.6)

## 2021-05-22 LAB — POCT UA - MICROALBUMIN
Albumin/Creatinine Ratio, Urine, POC: <30
Creatinine, POC: 200 mg/dL
Microalbumin Ur, POC: 30 mg/L

## 2021-05-22 MED ORDER — GLIPIZIDE ER 5 MG PO TB24: 5.0000 mg | ORAL_TABLET | Freq: Every day | ORAL | 3 refills | Status: DC

## 2021-05-22 NOTE — Progress Notes (Signed)
05/22/2021, 2:24 PM   Endocrinology Follow Up Visit    Subjective:    Patient ID: Tracey [name], female    DOB: [birth year].  Tracey [name] is being engaged in follow-up for management of currently uncontrolled symptomatic type 2 diabetes, hyperlipidemia, hypertension.    PMD:   Fayrene Helper, MD.  Past Medical History:  Diagnosis Date   Arthritis    Diabetes mellitus without complication ([place])    Diastolic dysfunction 10/6158   Grade 1   GERD (gastroesophageal reflux disease)    Hyperlipidemia    Hyperlipidemia    Hypertension 04/15/2012   Morbid obesity ([place])    PE (pulmonary embolism)    Pulmonary emboli ([place]) 1999   post TAH   Sleep apnea    Past Surgical History:  Procedure Laterality Date   ABDOMINAL HYSTERECTOMY  1999   , totalfor fibroids   CESAREAN SECTION     CHOLECYSTECTOMY  1996   COLONOSCOPY N/A 10/31/2015   Procedure: COLONOSCOPY;  Surgeon: Rogene Houston, MD;  [facility];  Service: Endoscopy;  Laterality: N/A;  930   FOOT SURGERY Right    October 2022   Social History   Socioeconomic History   Marital status: Single    Spouse name: Not on file   Number of children: Not on file   Years of education: Not on file   Highest education level: Not on file  Occupational History   Not on file  Tobacco Use   Smoking status: Never   Smokeless tobacco: Never  Vaping Use   Vaping Use: Never used  Substance and Sexual Activity   Alcohol use: No    Alcohol/week: 0.0 standard drinks   Drug use: No   Sexual activity: Not Currently  Other Topics Concern   Not on file  Social History Narrative   Not on file   Social Determinants of Health   Financial Resource Strain: Low Risk    Difficulty of Paying Living Expenses: Not hard at all  Food Insecurity: No Food Insecurity   Worried About Charity fundraiser in the  Last Year: Never true   [place] in the Last Year: Never true  Transportation Needs: No Transportation Needs   Lack of Transportation (Medical): No   Lack of Transportation (Non-Medical): No  Physical Activity: Inactive   Days of Exercise per Week: 0 days   Minutes of Exercise per Session: 0 min  Stress: No Stress Concern Present   Feeling of Stress : Not at all  Social Connections: Moderately Isolated   Frequency of Communication with Friends and Family: More than three times a week   Frequency of Social Gatherings with Friends and Family: More than three times a week   Attends Religious Services: 1 to 4 times per year   Active Member of [employer] or Organizations: No   Attends Archivist Meetings: Never   Marital Status: Divorced  Outpatient Encounter Medications as of 05/22/2021  Medication Sig   glipiZIDE (GLUCOTROL XL) 5 MG 24 hr tablet Take 1 tablet (5 mg total) by mouth daily with breakfast.   acetaminophen (TYLENOL) 500 MG tablet Take 500 mg by mouth every 6 (six) hours as needed for moderate pain.   amoxicillin (AMOXIL) 500 MG capsule Take 500 mg by mouth 3 (three) times daily.   aspirin EC 81 MG tablet Take 1 tablet (81 mg total) by mouth daily.   blood glucose meter kit and supplies KIT Dispense based on patient and insurance preference. Use up to four times daily as directed.   Cholecalciferol (VITAMIN D-3) 125 MCG (5000 UT) TABS Take 5,000 Units by mouth daily.   glucose blood (ACCU-CHEK GUIDE) test strip USE 1 STRIP TO CHECK GLUCOSE 3 TIMES DAILY AS DIRECTED   insulin regular human CONCENTRATED (HUMULIN R U-500 KWIKPEN) 500 UNIT/ML KwikPen INJECT 55 UNITS INTO THE SKIN 3 TIMES DAILY WITH MEALS.   Lancets MISC Use to test BG qid. Dx : E11.65. Softclix Lancets   metFORMIN (GLUCOPHAGE) 1000 MG tablet TAKE ONE TABLET BY MOUTH TWICE A DAY WITH A MEAL   metoprolol tartrate (LOPRESSOR) 50 MG tablet TAKE ONE TABLET (50MG TOTAL) BY MOUTH TWO TIMES DAILY   omeprazole  (PRILOSEC) 20 MG capsule TAKE ONE CAPSULE BY MOUTH DAILY   rosuvastatin (CRESTOR) 20 MG tablet Take 1 tablet (20 mg total) by mouth daily.   Semaglutide,0.25 or 0.5MG/DOS, (OZEMPIC, 0.25 OR 0.5 MG/DOSE,) 2 MG/1.5ML SOPN Inject 0.5 mg into the skin once a week.   spironolactone (ALDACTONE) 25 MG tablet TAKE ONE (1) TABLET BY MOUTH EVERY DAY   UNIFINE PENTIPS 31G X 8 MM MISC USE FOR INSULIN INJECTIONS THREE TIMES DAILY.   [DISCONTINUED] glipiZIDE (GLUCOTROL XL) 10 MG 24 hr tablet TAKE ONE TABLET (10MG TOTAL) BY MOUTH DAILY WITH BREAKFAST   No facility-administered encounter medications on file as of 05/22/2021.    ALLERGIES: Allergies  Allergen Reactions   Haloperidol Lactate Anaphylaxis   Lipitor [Atorvastatin Calcium] Other (See Comments)    Markedly elevated liver enzymes    VACCINATION STATUS: Immunization History  Administered Date(s) Administered   Influenza,inj,Quad PF,6+ Mos 04/01/2015, 03/16/2016, 01/20/2017, 04/11/2018, 02/13/2019, 01/31/2020, 12/05/2020   Moderna Sars-Covid-2 Vaccination 12/13/2019, 01/11/2020   PNEUMOCOCCAL CONJUGATE-20 12/05/2020   Pneumococcal Polysaccharide-23 05/13/2015    Diabetes She presents for her follow-up diabetic visit. She has type 2 diabetes mellitus. Onset time: He was diagnosed at approximate age of 66 years. Her disease course has been improving. There are no hypoglycemic associated symptoms. Pertinent negatives for hypoglycemia include no confusion, headaches, pallor or seizures. Associated symptoms include fatigue and weight loss. Pertinent negatives for diabetes include no blurred vision, no chest pain, no polydipsia, no polyphagia and no polyuria. There are no hypoglycemic complications. Symptoms are stable. Diabetic complications include heart disease and nephropathy. Risk factors for coronary artery disease include diabetes mellitus, dyslipidemia, family history, hypertension, obesity, post-menopausal and sedentary lifestyle. Current  diabetic treatment includes intensive insulin program and oral agent (dual therapy). She is compliant with treatment most of the time. Her weight is decreasing steadily. She is following a generally unhealthy diet. When asked about meal planning, she reported none. She has had a previous visit with a dietitian. She never participates in exercise. Her home blood glucose trend is decreasing steadily. Her overall blood glucose range is 130-140 mg/dl. (She presents today with her meter, no logs, showing inconsistent glucose monitoring pattern (says her insurance company will not pay  for as many strips as she needs because she has a CGM which she hasnt started using yet).  Her POCT A1c today is 6.5%, improving from last visit of 7.7%.  She denies any symptoms of hypoglycemia.  She says the lowest she has seen in her glucometer was in the 80s and she did not have symptoms at that time.  Analysis of her meter shows 7-day average of 134 with 6 readings; 14-day average of 138 with 12 readings; 30-day average of 133 with 29 readings; and 90-day average of 134 with 117 readings.) An ACE inhibitor/angiotensin II receptor blocker is not being taken. She does not see a podiatrist.Eye exam is not current.  Hyperlipidemia This is a chronic problem. The current episode started more than 1 year ago. The problem is uncontrolled. Recent lipid tests were reviewed and are high. Exacerbating diseases include chronic renal disease, diabetes and obesity. Factors aggravating her hyperlipidemia include fatty foods. Pertinent negatives include no chest pain, myalgias or shortness of breath. Current antihyperlipidemic treatment includes statins. The current treatment provides moderate improvement of lipids. Compliance problems include adherence to diet and adherence to exercise.  Risk factors for coronary artery disease include diabetes mellitus, dyslipidemia, hypertension, obesity, a sedentary lifestyle and post-menopausal.   Hypertension This is a chronic problem. The current episode started more than 1 year ago. The problem is unchanged. The problem is uncontrolled. Pertinent negatives include no blurred vision, chest pain, headaches, palpitations or shortness of breath. There are no associated agents to hypertension. Risk factors for coronary artery disease include dyslipidemia, diabetes mellitus, obesity, sedentary lifestyle and post-menopausal state. Past treatments include beta blockers and diuretics. The current treatment provides moderate improvement. Compliance problems include exercise and diet.  Hypertensive end-organ damage includes kidney disease. Identifiable causes of hypertension include chronic renal disease.    Review of systems  Constitutional: + steadily decreasing body weight,  current Body mass index is 53.95 kg/m. , + fatigue, no subjective hyperthermia, no subjective hypothermia Eyes: no blurry vision, no xerophthalmia ENT: no sore throat, no nodules palpated in throat, no dysphagia/odynophagia, no hoarseness Cardiovascular: no chest pain, + shortness of breath with activity, no palpitations, no leg swelling Respiratory: no cough, no shortness of breath Gastrointestinal: no nausea/vomiting/diarrhea Musculoskeletal: no muscle/joint aches Skin: no rashes, no hyperemia,  Neurological: no tremors, no numbness, no tingling, no dizziness Psychiatric: no depression, no anxiety    Objective:    BP (!) 158/71    Pulse 92    Ht _0  (1.778 m)    Wt (!) 376 lb (170.6 kg)    BMI 53.95 kg/m   Wt Readings from Last 3 Encounters:  05/22/21 (!) 376 lb (170.6 kg)  02/13/21 (!) 379 lb 9.6 oz (172.2 kg)  02/07/21 (!) 382 lb (173.3 kg)     BP Readings from Last 3 Encounters:  05/22/21 (!) 158/71  02/13/21 (!) 164/67  12/05/20 140/70     Physical Exam- Limited  Constitutional:  Body mass index is 53.95 kg/m. , not in acute distress, normal state of mind Eyes:  EOMI, no exophthalmos Neck:  Supple Cardiovascular: RRR, no murmurs, rubs, or gallops, no edema Respiratory: Adequate breathing efforts, no crackles, rales, rhonchi, or wheezing Musculoskeletal: no gross deformities, strength intact in all four extremities, no gross restriction of joint movements Skin:  no rashes, no hyperemia Neurological: no tremor with outstretched hands    CMP     Component Value Date/Time   NA 139 12/05/2020 1056   K 4.2 12/05/2020 1056  CL 99 12/05/2020 1056   CO2 18 (L) 12/05/2020 1056   GLUCOSE 172 (H) 12/05/2020 1056   GLUCOSE 148 (H) 12/21/2019 1252   BUN 11 12/05/2020 1056   CREATININE 0.85 12/05/2020 1056   CREATININE 0.71 12/21/2019 1252   CALCIUM 9.4 12/05/2020 1056   PROT 7.4 12/05/2020 1056   ALBUMIN 4.2 12/05/2020 1056   AST 37 12/05/2020 1056   ALT 32 12/05/2020 1056   ALKPHOS 64 12/05/2020 1056   BILITOT 0.3 12/05/2020 1056   GFRNONAA 77 02/13/2019 1207   GFRAA 90 02/13/2019 1207    Diabetic Labs (most recent): Lab Results  Component Value Date   HGBA1C 6.5 05/22/2021   HGBA1C 7.7 (A) 02/13/2021   HGBA1C 8.0 (A) 10/31/2020    Lipid Panel     Component Value Date/Time   CHOL 239 (H) 12/05/2020 1056   TRIG 128 12/05/2020 1056   HDL 42 12/05/2020 1056   CHOLHDL 5.7 (H) 12/05/2020 1056   CHOLHDL 3.5 02/01/2020 1415   VLDL 23 07/13/2016 1430   LDLCALC 174 (H) 12/05/2020 1056   LDLCALC 75 02/01/2020 1415      Lab Results  Component Value Date   TSH 0.727 12/05/2020   TSH 0.69 02/13/2019   TSH 1.23 05/09/2018   TSH 0.95 07/13/2016   TSH 1.26 07/10/2015   TSH 1.382 11/17/2013   TSH 0.519 04/14/2012   TSH 1.525 11/07/2009      Lipid Panel     Component Value Date/Time   CHOL 239 (H) 12/05/2020 1056   TRIG 128 12/05/2020 1056   HDL 42 12/05/2020 1056   CHOLHDL 5.7 (H) 12/05/2020 1056   CHOLHDL 3.5 02/01/2020 1415   VLDL 23 07/13/2016 1430   LDLCALC 174 (H) 12/05/2020 1056   [place] 75 02/01/2020 1415     Assessment & Plan:   1)  Uncontrolled type 2 diabetes mellitus with hyperglycemia ([place])  - Markell D Cassidy has currently uncontrolled symptomatic type 2 DM since 66 years of age.  -She is now on insulin U500.    She presents today with her meter, no logs, showing inconsistent glucose monitoring pattern (says her insurance company will not pay for as many strips as she needs because she has a CGM which she hasnt started using yet).  Her POCT A1c today is 6.5%, improving from last visit of 7.7%.  She denies any symptoms of hypoglycemia.  She says the lowest she has seen in her glucometer was in the 80s and she did not have symptoms at that time.  Analysis of her meter shows 7-day average of 134 with 6 readings; 14-day average of 138 with 12 readings; 30-day average of 133 with 29 readings; and 90-day average of 134 with 117 readings.  -her diabetes is complicated by obesity/sedentary life and TEKOA HAMOR remains at a high risk for more acute and chronic complications which include CAD, CVA, CKD, retinopathy, and neuropathy. These are all discussed in detail with the patient.  - Nutritional counseling repeated at each appointment due to patients tendency to fall back in to old habits.  - The patient admits there is a room for improvement in their diet and drink choices. -  Suggestion is made for the patient to avoid simple carbohydrates from their diet including Cakes, Sweet Desserts / Pastries, Ice Cream, Soda (diet and regular), Sweet Tea, Candies, Chips, Cookies, Sweet Pastries, Store Bought Juices, Alcohol in Excess of 1-2 drinks a day, Artificial Sweeteners, Coffee Creamer, and "Sugar-free" Products. This will help  patient to have stable blood glucose profile and potentially avoid unintended weight gain.   - I encouraged the patient to switch to unprocessed or minimally processed complex starch and increased protein intake (animal or plant source), fruits, and vegetables.   - Patient is advised to stick to a  routine mealtimes to eat 3 meals a day and avoid unnecessary snacks (to snack only to correct hypoglycemia).  - I have approached her with the following individualized plan to manage diabetes and patient agrees:   -There is a lack of engagement inhibiting optimal management of her diabetes to target.  She continues to have inconsistent monitoring pattern.  -Based on her improved glycemic profile overall, she is advised to continue her dose of U-500 at 55 units TID with meals if glucose is above 90 and she is eating.  She is advised to continue Metformin 1000 mg po twice daily with meals, and lower her Glipizide to 5 mg XL daily with breakfast- to avoid any inadvertent hypoglycemia.  She can also continue her Ozempic 0.5 mg SQ weekly, has tolerated well so far.  -Encouraged patient to monitor blood glucose 4 times per day, before meals and at bedtime and notify the clinic if blood glucose levels are less than 70 or greater than 300 for 3 tests in a row.   She brought her CGM today for assistance with application.  -She is also advised not to inject insulin without proper glucose monitoring.  2) BP/HTN:  Her blood pressure is still not controlled to target but is improving.  She typically waits to take her Spironolactone 25 mg until she gets home so she won't have to urinate as often.  She is advised to continue Metoprolol 50 mg po twice daily and continue Spironolactone 25 mg po daily.    3) Lipids/HPL:  Her recent lipid panel from 12/21/19 shows controlled LDL of 75.  She is advised to Continue Crestor 20 mg po daily at bedtime.  Side effects and precautions discussed with her.  Due for lipid recheck soon (ordered by PCP).  4) Vitamin D Deficiency: Her recent vitamin D level on 07/08/20 was 33.1.  She has completed replenishment with ergocalciferol and is now taking OTC Vitamin D3 5000 units daily as maintenance dose.  She is advised to continue for now.    5) Chronic Care/Health Maintenance: -she  is on Statin medications and is encouraged to continue to follow up with Ophthalmology, Dentist, Podiatrist at least yearly or according to recommendations, and advised to stay away from smoking. I have recommended yearly flu vaccine and pneumonia vaccination at least every 5 years; moderate intensity exercise for up to 150 minutes weekly; and  sleep for at least 7 hours a day.  - I advised patient to maintain close follow up with Fayrene Helper, MD for primary care needs.     I spent 42 minutes in the care of the patient today including review of labs from [facility], Lipids, Thyroid Function, Hematology (current and previous including abstractions from other facilities); face-to-face time discussing  her blood glucose readings/logs, discussing hypoglycemia and hyperglycemia episodes and symptoms, medications doses, her options of short and long term treatment based on the latest standards of care / guidelines;  discussion about incorporating lifestyle medicine;  and documenting the encounter.    Please refer to Patient Instructions for Blood Glucose Monitoring and Insulin/Medications Dosing Guide"  in media tab for additional information. Please  also refer to " Patient Self Inventory" in the Media  tab for reviewed elements of pertinent patient history.  Duane Boston participated in the discussions, expressed understanding, and voiced agreement with the above plans.  All questions were answered to her satisfaction. she is encouraged to contact clinic should she have any questions or concerns prior to her return visit.   Follow up plan: - Return in about 3 months (around 08/19/2021) for Diabetes F/U with A1c in office, Previsit labs, Bring meter and logs.   Rayetta Pigg, [facility] [address] [place], [place] [address] Phone: [phone] Fax: [phone]  05/22/2021, 2:24 PM

## 2021-05-28 ENCOUNTER — Ambulatory Visit: Payer: Medicare Other | Admitting: Podiatry

## 2021-05-30 ENCOUNTER — Ambulatory Visit: Payer: Medicare Other | Admitting: Internal Medicine

## 2021-06-01 DIAGNOSIS — Z20822 Contact with and (suspected) exposure to covid-19: Secondary | ICD-10-CM | POA: Diagnosis not present

## 2021-06-24 ENCOUNTER — Other Ambulatory Visit: Payer: Self-pay

## 2021-06-24 ENCOUNTER — Encounter: Payer: Self-pay | Admitting: Family Medicine

## 2021-06-24 ENCOUNTER — Ambulatory Visit (INDEPENDENT_AMBULATORY_CARE_PROVIDER_SITE_OTHER): Payer: Medicare Other | Admitting: Family Medicine

## 2021-06-24 VITALS — BP 146/77 | HR 117 | Resp 18 | Ht 70.0 in | Wt 374.0 lb

## 2021-06-24 DIAGNOSIS — E559 Vitamin D deficiency, unspecified: Secondary | ICD-10-CM

## 2021-06-24 DIAGNOSIS — E782 Mixed hyperlipidemia: Secondary | ICD-10-CM

## 2021-06-24 DIAGNOSIS — R928 Other abnormal and inconclusive findings on diagnostic imaging of breast: Secondary | ICD-10-CM

## 2021-06-24 DIAGNOSIS — E1169 Type 2 diabetes mellitus with other specified complication: Secondary | ICD-10-CM | POA: Diagnosis not present

## 2021-06-24 DIAGNOSIS — R0609 Other forms of dyspnea: Secondary | ICD-10-CM | POA: Diagnosis not present

## 2021-06-24 DIAGNOSIS — I10 Essential (primary) hypertension: Secondary | ICD-10-CM

## 2021-06-24 DIAGNOSIS — E669 Obesity, unspecified: Secondary | ICD-10-CM

## 2021-06-24 DIAGNOSIS — D539 Nutritional anemia, unspecified: Secondary | ICD-10-CM

## 2021-06-24 DIAGNOSIS — E1165 Type 2 diabetes mellitus with hyperglycemia: Secondary | ICD-10-CM

## 2021-06-24 DIAGNOSIS — Z1231 Encounter for screening mammogram for malignant neoplasm of breast: Secondary | ICD-10-CM | POA: Diagnosis not present

## 2021-06-24 MED ORDER — OLMESARTAN MEDOXOMIL 20 MG PO TABS: 20.0000 mg | ORAL_TABLET | Freq: Every day | ORAL | 3 refills | Status: DC

## 2021-06-24 NOTE — Patient Instructions (Signed)
F/U in 10 weeks re evaluate blood pressure, call if you need me sooner ? ?Labs today, lipid, cmp and EGFR, CBC, TSH and vit D ? ?New additional medication for BP is olmesartan 20 mg one daily, stay on spironolactone as before ? ?Non fasting chem 7 and EGFR in 4 weeks ( due to new BP med) ? ?Pls get shingrix vaccines at the pharmacy ? ?Thanks for choosing [facility], we consider it a privelige to serve you. ?  Please schedule mammogram at checkout ? ?Thanks for choosing [facility], we consider it a privelige to serve you. ? ? ? ? ? ?

## 2021-06-25 ENCOUNTER — Other Ambulatory Visit (HOSPITAL_COMMUNITY): Payer: Self-pay | Admitting: Family Medicine

## 2021-06-25 DIAGNOSIS — N6489 Other specified disorders of breast: Secondary | ICD-10-CM

## 2021-06-25 LAB — CMP14+EGFR
ALT: 21 IU/L (ref 0–32)
AST: 23 IU/L (ref 0–40)
Albumin/Globulin Ratio: 1.4 (ref 1.2–2.2)
Albumin: 4.6 g/dL (ref 3.8–4.8)
Alkaline Phosphatase: 65 IU/L (ref 44–121)
BUN/Creatinine Ratio: 11 — ABNORMAL LOW (ref 12–28)
BUN: 8 mg/dL (ref 8–27)
Bilirubin Total: 0.5 mg/dL (ref 0.0–1.2)
CO2: 23 mmol/L (ref 20–29)
Calcium: 9.8 mg/dL (ref 8.7–10.3)
Chloride: 100 mmol/L (ref 96–106)
Creatinine, Ser: 0.72 mg/dL (ref 0.57–1.00)
Globulin, Total: 3.3 g/dL (ref 1.5–4.5)
Glucose: 138 mg/dL — ABNORMAL HIGH (ref 70–99)
Potassium: 4.2 mmol/L (ref 3.5–5.2)
Sodium: 140 mmol/L (ref 134–144)
Total Protein: 7.9 g/dL (ref 6.0–8.5)
eGFR: 93 mL/min/{1.73_m2} (ref >59)

## 2021-06-25 LAB — CBC
Hematocrit: 40.5 % (ref 34.0–46.6)
Hemoglobin: 13.6 g/dL (ref 11.1–15.9)
MCH: 25.7 pg — ABNORMAL LOW (ref 26.6–33.0)
MCHC: 33.6 g/dL (ref 31.5–35.7)
MCV: 77 fL — ABNORMAL LOW (ref 79–97)
Platelets: 343 10*3/uL (ref 150–450)
RBC: 5.29 x10E6/uL — ABNORMAL HIGH (ref 3.77–5.28)
RDW: 19.9 % — ABNORMAL HIGH (ref 11.7–15.4)
WBC: 11.2 10*3/uL — ABNORMAL HIGH (ref 3.4–10.8)

## 2021-06-25 LAB — LIPID PANEL
Chol/HDL Ratio: 3.7 ratio (ref 0.0–4.4)
Cholesterol, Total: 153 mg/dL (ref 100–199)
HDL: 41 mg/dL (ref >39)
LDL Chol Calc (NIH): 86 mg/dL (ref 0–99)
Triglycerides: 146 mg/dL (ref 0–149)
VLDL Cholesterol Cal: 26 mg/dL (ref 5–40)

## 2021-06-25 LAB — VITAMIN D 25 HYDROXY (VIT D DEFICIENCY, FRACTURES): Vit D, 25-Hydroxy: 34.1 ng/mL (ref 30.0–100.0)

## 2021-06-25 LAB — TSH: TSH: 0.955 u[IU]/mL (ref 0.450–4.500)

## 2021-06-29 ENCOUNTER — Encounter: Payer: Self-pay | Admitting: Family Medicine

## 2021-06-29 DIAGNOSIS — R0609 Other forms of dyspnea: Secondary | ICD-10-CM | POA: Insufficient documentation

## 2021-06-29 NOTE — Assessment & Plan Note (Signed)
Hyperlipidemia:Low fat diet discussed and encouraged.   Lipid Panel  Lab Results  Component Value Date   CHOL 153 06/24/2021   HDL 41 06/24/2021   LDLCALC 86 06/24/2021   TRIG 146 06/24/2021   CHOLHDL 3.7 06/24/2021     Controlled, no change in medication  

## 2021-06-29 NOTE — Assessment & Plan Note (Signed)
Tracey [name] is reminded of the importance of commitment to daily physical activity for 30 minutes or more, as able and the need to limit carbohydrate intake to 30 to 60 grams per meal to help with blood sugar control.  ? ?The need to take medication as prescribed, test blood sugar as directed, and to call between visits if there is a concern that blood sugar is uncontrolled is also discussed.  ? ?Tracey [name] is reminded of the importance of daily foot exam, annual eye examination, and good blood sugar, blood pressure and cholesterol control. ?controlledand managed by Endo ? ?Diabetic Labs Latest Ref Rng & Units 06/24/2021 05/22/2021 05/22/2021 02/13/2021 12/05/2020  ?HbA1c 4.0 - 5.6 % - 6.5(A) 6.5 7.7(A) -  ?Microalbumin mg/L - 30 - - -  ?Micro/Creat Ratio - - <30 - - -  ?Chol 100 - 199 mg/dL 153 - - - 239(H)  ?HDL >39 mg/dL 41 - - - 42  ?Calc LDL 0 - 99 mg/dL 86 - - - 174(H)  ?Triglycerides 0 - 149 mg/dL 146 - - - 128  ?Creatinine 0.57 - 1.00 mg/dL 0.72 - - - 0.85  ? ?BP/Weight 06/24/2021 05/22/2021 02/13/2021 02/07/2021 12/05/2020 12/05/2020 11/25/2020  ?Systolic BP 784 784 128 - 145 140 148  ?Diastolic BP 77 71 67 - 81 70 74  ?Wt. (Lbs) 374 376 379.6 382 391.8 392 394  ?BMI 53.66 53.95 54.47 54.81 56.22 56.25 56.53  ? ?Foot/eye exam completion dates Latest Ref Rng & Units 02/13/2019 08/19/2016  ?Eye Exam No Retinopathy - No Retinopathy  ?Foot Form Completion - Done -  ? ? ? ? ? ?

## 2021-06-29 NOTE — Assessment & Plan Note (Signed)
?  Patient re-educated about  the importance of commitment to a  minimum of 150 minutes of exercise per week as able. ? ?The importance of healthy food choices with portion control discussed, as well as eating regularly and within a 12 hour window most days. ?The need to choose "clean , green" food 50 to 75% of the time is discussed, as well as to make water the primary drink and set a goal of 64 ounces water daily. ? ?  ?Weight /BMI 06/24/2021 05/22/2021 02/13/2021  ?WEIGHT 374 lb 376 lb 379 lb 9.6 oz  ?HEIGHT '5\' 10"'$  '5\' 10"'$  '5\' 10"'$   ?BMI 53.66 kg/m2 53.95 kg/m2 54.47 kg/m2  ? ? ? ?

## 2021-06-29 NOTE — Progress Notes (Signed)
? ?Tracey [name]     MRN: [id number]      DOB: 1955/06/20 ? ? ?HPI ?Tracey [name] is here for follow up and re-evaluation of chronic medical conditions, medication management and review of any available recent lab and radiology data.  ?Preventive health is updated, specifically  Cancer screening and Immunization.   ?Questions or concerns regarding consultations or procedures which the PT has had in the interim are  addressed. ?The PT denies any adverse reactions to current medications since the last visit.  ?C/o significant fatigue and poor exercise tolerance with minimal activity in past several weeks ?Denies polyuria, polydipsia, blurred vision , or hypoglycemic episodes. ? ? ?ROS ?Denies recent fever or chills. ?Denies sinus pressure, nasal congestion, ear pain or sore throat. ?Denies chest congestion, productive cough or wheezing. ?Denies chest pains, palpitations  ?Denies abdominal pain, nausea, vomiting,diarrhea or constipation.   ?Denies dysuria, frequency, hesitancy or incontinence. ?Chronic  joint pain, swelling and limitation in mobility. ?Denies headaches, seizures, numbness, or tingling. ?Denies depression, anxiety or insomnia. ?Denies skin break down or rash. ? ? ?PE ? ?BP (!) 146/77   Pulse (!) 117   Resp 18   Ht '5\' 10"'$  (1.778 m)   Wt (!) 374 lb (169.6 kg)   SpO2 93%   BMI 53.66 kg/m?  ? ?Patient alert and oriented and in no cardiopulmonary distress. ? ?HEENT: No facial asymmetry, EOMI,     Neck supple . ? ?Chest: Clear to auscultation bilaterally. ? ?CVS: S1, S2 no murmurs, no S3.Regular rate. ? ?ABD: Soft non tender.  ? ?Ext: trace  edema ? ?MS: decreased  ROM spine, shoulders, hips and knees. ? ?Skin: Intact, no ulcerations or rash noted. ? ?Psych: Good eye contact, normal affect. Memory intact not anxious or depressed appearing. ? ?CNS: CN 2-12 intact, power,  normal throughout.no focal deficits noted. ? ? ?Assessment & Plan ? ?Essential hypertension, benign ?DASH diet and commitment to  daily physical activity for a minimum of 30 minutes discussed and encouraged, as a part of hypertension management. ?The importance of attaining a healthy weight is also discussed. ? ?BP/Weight 06/24/2021 05/22/2021 02/13/2021 02/07/2021 12/05/2020 12/05/2020 11/25/2020  ?Systolic BP 644 034 742 - 145 140 148  ?Diastolic BP 77 71 67 - 81 70 74  ?Wt. (Lbs) 374 376 379.6 382 391.8 392 394  ?BMI 53.66 53.95 54.47 54.81 56.22 56.25 56.53  ? ? ? ?Add olmesartan, uncontrolled ? ?Dyspnea on effort ?Reports significant exertional dyspnea with minimal activity in last several weeks, refer to Cardiology for sooner appt  possible, missed /did not have December follow up as recommended at last visit ? ?Morbid obesity ? ?Patient re-educated about  the importance of commitment to a  minimum of 150 minutes of exercise per week as able. ? ?The importance of healthy food choices with portion control discussed, as well as eating regularly and within a 12 hour window most days. ?The need to choose "clean , green" food 50 to 75% of the time is discussed, as well as to make water the primary drink and set a goal of 64 ounces water daily. ? ?  ?Weight /BMI 06/24/2021 05/22/2021 02/13/2021  ?WEIGHT 374 lb 376 lb 379 lb 9.6 oz  ?HEIGHT '5\' 10"'$  '5\' 10"'$  '5\' 10"'$   ?BMI 53.66 kg/m2 53.95 kg/m2 54.47 kg/m2  ? ? ? ? ?Diabetes mellitus type 2 in obese Tracey [name]) ?Tracey [name] is reminded of the importance of commitment to daily physical activity for 30 minutes or more, as able  and the need to limit carbohydrate intake to 30 to 60 grams per meal to help with blood sugar control.  ? ?The need to take medication as prescribed, test blood sugar as directed, and to call between visits if there is a concern that blood sugar is uncontrolled is also discussed.  ? ?Tracey [name] is reminded of the importance of daily foot exam, annual eye examination, and good blood sugar, blood pressure and cholesterol control. ?controlledand managed by Endo ? ?Diabetic Labs Latest Ref Rng &  Units 06/24/2021 05/22/2021 05/22/2021 02/13/2021 12/05/2020  ?HbA1c 4.0 - 5.6 % - 6.5(A) 6.5 7.7(A) -  ?Microalbumin mg/L - 30 - - -  ?Micro/Creat Ratio - - <30 - - -  ?Chol 100 - 199 mg/dL 153 - - - 239(H)  ?HDL >39 mg/dL 41 - - - 42  ?Calc LDL 0 - 99 mg/dL 86 - - - 174(H)  ?Triglycerides 0 - 149 mg/dL 146 - - - 128  ?Creatinine 0.57 - 1.00 mg/dL 0.72 - - - 0.85  ? ?BP/Weight 06/24/2021 05/22/2021 02/13/2021 02/07/2021 12/05/2020 12/05/2020 11/25/2020  ?Systolic BP 128 786 767 - 145 140 148  ?Diastolic BP 77 71 67 - 81 70 74  ?Wt. (Lbs) 374 376 379.6 382 391.8 392 394  ?BMI 53.66 53.95 54.47 54.81 56.22 56.25 56.53  ? ?Foot/eye exam completion dates Latest Ref Rng & Units 02/13/2019 08/19/2016  ?Eye Exam No Retinopathy - No Retinopathy  ?Foot Form Completion - Done -  ? ? ? ? ? ? ?Mixed hyperlipidemia ?Hyperlipidemia:Low fat diet discussed and encouraged. ? ? ?Lipid Panel  ?Lab Results  ?Component Value Date  ? CHOL 153 06/24/2021  ? HDL 41 06/24/2021  ? [place] 86 06/24/2021  ? TRIG 146 06/24/2021  ? CHOLHDL 3.7 06/24/2021  ? ?Controlled, no change in medication ? ? ? ? ?

## 2021-06-29 NOTE — Assessment & Plan Note (Signed)
Reports significant exertional dyspnea with minimal activity in last several weeks, refer to Cardiology for sooner appt  possible, missed /did not have December follow up as recommended at last visit ?

## 2021-06-29 NOTE — Assessment & Plan Note (Signed)
DASH diet and commitment to daily physical activity for a minimum of 30 minutes discussed and encouraged, as a part of hypertension management. ?The importance of attaining a healthy weight is also discussed. ? ?BP/Weight 06/24/2021 05/22/2021 02/13/2021 02/07/2021 12/05/2020 12/05/2020 11/25/2020  ?Systolic BP 494 496 759 - 145 140 148  ?Diastolic BP 77 71 67 - 81 70 74  ?Wt. (Lbs) 374 376 379.6 382 391.8 392 394  ?BMI 53.66 53.95 54.47 54.81 56.22 56.25 56.53  ? ? ? ?Add olmesartan, uncontrolled ?

## 2021-07-03 ENCOUNTER — Telehealth: Payer: Self-pay

## 2021-07-03 NOTE — Telephone Encounter (Signed)
Pt has been made aware of lab results.  

## 2021-07-03 NOTE — Telephone Encounter (Signed)
Pt is returning call from yesterday regarding her test results. ?

## 2021-07-11 ENCOUNTER — Ambulatory Visit: Payer: Medicare Other | Admitting: Podiatry

## 2021-07-21 ENCOUNTER — Other Ambulatory Visit: Payer: Self-pay | Admitting: Family Medicine

## 2021-07-31 NOTE — Progress Notes (Deleted)
Cardiology Office Note   Date:  07/31/2021   ID:  Tracey [name], Tracey [name] [birth year], MRN [id number]  PCP:  Fayrene Helper, MD  Cardiologist:   [name], MD   Pt presents for f/u of HTN and chronic SOB      History of Present Illness: Tracey [name] is a 65 y.o. female with a history of CP, DM, HTN, PE  HL and morbid obesity.  Dobutamine echo in 2014 normal   Echo in 2020 LVEF normal    I last saw the pt in July 2022   BP at time 140s/  Her HR was fast when I saw her  110s   Labs checked     Patient says that with walking in apartment she will get a little SOB   She has to crawl up steps due to unsteadiness, concern of falling  She denies CP     I saw the pt in Aug 2022    No outpatient medications have been marked as taking for the 08/01/21 encounter (Appointment) with Fay Records, MD.     Allergies:   Haloperidol lactate and Lipitor [atorvastatin calcium]   Past Medical History:  Diagnosis Date   Arthritis    Diabetes mellitus without complication ([place])    Diastolic dysfunction 06/8248   Grade 1   GERD (gastroesophageal reflux disease)    Hyperlipidemia    Hyperlipidemia    Hypertension 04/15/2012   Morbid obesity ([place])    PE (pulmonary embolism)    Pulmonary emboli ([place]) 1999   post TAH   Sleep apnea     Past Surgical History:  Procedure Laterality Date   ABDOMINAL HYSTERECTOMY  1999   , totalfor fibroids   [place]   COLONOSCOPY N/A 10/31/2015   Procedure: COLONOSCOPY;  Surgeon: Rogene Houston, MD;  Location: [facility];  Service: Endoscopy;  Laterality: N/A;  930   FOOT SURGERY Right    October 2022     Social History:  The patient  reports that she has never smoked. She has never used smokeless tobacco. She reports that she does not drink alcohol and does not use drugs.   Family History:  The patient's family history includes Alcohol abuse in her father; Aneurysm in her brother; Arthritis in her  sister; Cirrhosis in her father; Coronary artery disease in her sister; Depression in her mother and son; Diabetes in her mother; Heart attack (age of onset: 13) in her sister; Heart murmur in her son; Hyperlipidemia in her father; Hypertension in her father, mother, and son; Sleep apnea in her sister.    ROS:  Please see the history of present illness. All other systems are reviewed and  Negative to the above problem except as noted.    PHYSICAL EXAM: VS:  There were no vitals taken for this visit.  GEN: Morbidly obese 66 yo in no acute distress  HEENT: normal  Neck: no obvious JVD Cardiac: RRR; no murmurs,,Triv to 1+ LE  edema  Respiratory:  clear to auscultation bilaterally,  GI: soft, Obese Skin: warm and dry, no rash Neuro:  Grossly intact Psych: euthymic mood, full affect   EKG:  EKG is not ordered today.    Lipid Panel    Component Value Date/Time   CHOL 153 06/24/2021 1616   TRIG 146 06/24/2021 1616   HDL 41 06/24/2021 1616   CHOLHDL 3.7 06/24/2021 1616   CHOLHDL 3.5 02/01/2020 1415  VLDL 23 07/13/2016 1430   LDLCALC 86 06/24/2021 1616   LDLCALC 75 02/01/2020 1415      Wt Readings from Last 3 Encounters:  06/24/21 (!) 374 lb (169.6 kg)  05/22/21 (!) 376 lb (170.6 kg)  02/13/21 (!) 379 lb 9.6 oz (172.2 kg)      ASSESSMENT AND PLAN:  1  Tachycardia   Each time the pt has been in clinic she has been mildly tachycardic   EKG with ST    Will get monitor to evaluate 24 hour control      2  Hx of HTN  BP is fair  Follow   Will check BNP    3 Hx of CP   PT denies pain   4  DM   last A1C is 8%      5.   HL     Curr  off of statin         Current medicines are reviewed at length with the patient today.  The patient does not have concerns regarding medicines.  Signed, [name], MD  07/31/2021 10:08 PM    [address], [place], [place]  [address] Phone: [phone]; Fax: [phone]

## 2021-08-01 ENCOUNTER — Ambulatory Visit: Payer: Medicare Other | Admitting: Internal Medicine

## 2021-08-04 DIAGNOSIS — Z20822 Contact with and (suspected) exposure to covid-19: Secondary | ICD-10-CM | POA: Diagnosis not present

## 2021-08-15 DIAGNOSIS — Z20822 Contact with and (suspected) exposure to covid-19: Secondary | ICD-10-CM | POA: Diagnosis not present

## 2021-08-18 ENCOUNTER — Encounter: Payer: Self-pay | Admitting: Podiatry

## 2021-08-18 ENCOUNTER — Ambulatory Visit (INDEPENDENT_AMBULATORY_CARE_PROVIDER_SITE_OTHER): Payer: Medicare Other | Admitting: Podiatry

## 2021-08-18 DIAGNOSIS — E1142 Type 2 diabetes mellitus with diabetic polyneuropathy: Secondary | ICD-10-CM | POA: Diagnosis not present

## 2021-08-18 DIAGNOSIS — M79675 Pain in left toe(s): Secondary | ICD-10-CM | POA: Diagnosis not present

## 2021-08-18 DIAGNOSIS — B351 Tinea unguium: Secondary | ICD-10-CM

## 2021-08-18 DIAGNOSIS — M79674 Pain in right toe(s): Secondary | ICD-10-CM | POA: Diagnosis not present

## 2021-08-19 ENCOUNTER — Ambulatory Visit: Payer: Medicare Other | Admitting: Nurse Practitioner

## 2021-08-19 NOTE — Patient Instructions (Incomplete)
Diabetes Mellitus and Foot Care Foot care is an important part of your health, especially when you have diabetes. Diabetes may cause you to have problems because of poor blood flow (circulation) to your feet and legs, which can cause your skin to: Become thinner and drier. Break more easily. Heal more slowly. Peel and crack. You may also have nerve damage (neuropathy) in your legs and feet, causing decreased feeling in them. This means that you may not notice minor injuries to your feet that could lead to more serious problems. Noticing and addressing any potential problems early is the best way to prevent future foot problems. How to care for your feet Foot hygiene  Wash your feet daily with warm water and mild soap. Do not use hot water. Then, pat your feet and the areas between your toes until they are completely dry. Do not soak your feet as this can dry your skin. Trim your toenails straight across. Do not dig under them or around the cuticle. File the edges of your nails with an emery board or nail file. Apply a moisturizing lotion or petroleum jelly to the skin on your feet and to dry, brittle toenails. Use lotion that does not contain alcohol and is unscented. Do not apply lotion between your toes. Shoes and socks Wear clean socks or stockings every day. Make sure they are not too tight. Do not wear knee-high stockings since they may decrease blood flow to your legs. Wear shoes that fit properly and have enough cushioning. Always look in your shoes before you put them on to be sure there are no objects inside. To break in new shoes, wear them for just a few hours a day. This prevents injuries on your feet. Wounds, scrapes, corns, and calluses  Check your feet daily for blisters, cuts, bruises, sores, and redness. If you cannot see the bottom of your feet, use a mirror or ask someone for help. Do not cut corns or calluses or try to remove them with medicine. If you find a minor scrape,  cut, or break in the skin on your feet, keep it and the skin around it clean and dry. You may clean these areas with mild soap and water. Do not clean the area with peroxide, alcohol, or iodine. If you have a wound, scrape, corn, or callus on your foot, look at it several times a day to make sure it is healing and not infected. Check for: Redness, swelling, or pain. Fluid or blood. Warmth. Pus or a bad smell. General tips Do not cross your legs. This may decrease blood flow to your feet. Do not use heating pads or hot water bottles on your feet. They may burn your skin. If you have lost feeling in your feet or legs, you may not know this is happening until it is too late. Protect your feet from hot and cold by wearing shoes, such as at the beach or on hot pavement. Schedule a complete foot exam at least once a year (annually) or more often if you have foot problems. Report any cuts, sores, or bruises to your health care provider immediately. Where to find more information American Diabetes Association: www.diabetes.org Association of Diabetes Care & Education Specialists: www.diabeteseducator.org Contact a health care provider if: You have a medical condition that increases your risk of infection and you have any cuts, sores, or bruises on your feet. You have an injury that is not healing. You have redness on your legs or feet. You   feel burning or tingling in your legs or feet. You have pain or cramps in your legs and feet. Your legs or feet are numb. Your feet always feel cold. You have pain around any toenails. Get help right away if: You have a wound, scrape, corn, or callus on your foot and: You have pain, swelling, or redness that gets worse. You have fluid or blood coming from the wound, scrape, corn, or callus. Your wound, scrape, corn, or callus feels warm to the touch. You have pus or a bad smell coming from the wound, scrape, corn, or callus. You have a fever. You have a red  line going up your leg. Summary Check your feet every day for blisters, cuts, bruises, sores, and redness. Apply a moisturizing lotion or petroleum jelly to the skin on your feet and to dry, brittle toenails. Wear shoes that fit properly and have enough cushioning. If you have foot problems, report any cuts, sores, or bruises to your health care provider immediately. Schedule a complete foot exam at least once a year (annually) or more often if you have foot problems. This information is not intended to replace advice given to you by your health care provider. Make sure you discuss any questions you have with your health care provider. Document Revised: 10/19/2019 Document Reviewed: 10/19/2019 Elsevier Patient Education  2023 Elsevier Inc.  

## 2021-08-26 NOTE — Progress Notes (Signed)
?  Subjective:  ?Patient ID: Tracey [name], female    DOB: 07-Apr-1956,  MRN: [id number] ? ?Tracey [name] presents to clinic today for at risk foot care with history of peripheral neuropathy and painful thick toenails that are difficult to trim. Pain interferes with ambulation. Aggravating factors include wearing enclosed shoe gear. Pain is relieved with periodic professional debridement. ? ?Last known HgA1c was unknown. ? ?New problem(s): None.  ? ?PCP is Fayrene Helper, MD , and last visit was June 24, 2021. ? ?Allergies  ?Allergen Reactions  ? Haloperidol Lactate Anaphylaxis  ? Lipitor [Atorvastatin Calcium] Other (See Comments)  ?  Markedly elevated liver enzymes  ? ? ?Review of Systems: Negative except as noted in the HPI. ? ?Objective: No changes noted in today's physical examination. ? ?Constitutional Tracey [name] is a pleasant 66 y.o. African American female, morbidly obese in NAD. AAO x 3.   ?Vascular Capillary refill time to digits immediate b/l. Palpable pedal pulses b/l LE. Pedal hair absent. No pain with calf compression b/l. Lower extremity skin temperature gradient within normal limits. Nonpitting edema noted right >left. No cyanosis or clubbing noted.  ?Neurologic Normal speech. Oriented to person, place, and time. Protective sensation decreased with 10 gram monofilament b/l.  ?Dermatologic Skin warm and supple b/l lower extremities. No open wounds b/l LE. No interdigital macerations noted b/l LE. Toenails 1-5 b/l elongated, discolored, dystrophic, thickened, crumbly with subungual debris and tenderness to dorsal palpation. Longitudinal scar noted lateral aspect of right foot completely epithelialized. Incurvated nailplate right great toe medial border(s) with tenderness to palpation. No erythema, no edema, no drainage noted.   ?Orthopedic: Normal muscle strength 5/5 to all lower extremity muscle groups bilaterally. No gross bony deformities b/l lower extremities.  ? ?Radiographs:  None ? ?  Latest Ref Rng & Units 05/22/2021  ?  2:25 PM 05/22/2021  ?  2:07 PM 02/13/2021  ?  1:20 PM 10/31/2020  ? 10:43 AM  ?Hemoglobin A1C  ?Hemoglobin-A1c 4.0 - 5.6 % 6.5   6.5   7.7   8.0    ? ?Assessment/Plan: ?1. Pain due to onychomycosis of toenails of both feet   ?2. Diabetic peripheral neuropathy associated with type 2 diabetes mellitus ([place])   ?  ?-Patient was evaluated and treated. All patient's and/or POA's questions/concerns answered on today's visit. ?-Patient to continue soft, supportive shoe gear daily. ?-Mycotic toenails 1-5 bilaterally were debrided in length and girth with sterile nail nippers and dremel without incident. ?-Offending nail border debrided and curretaged medial border right hallux utilizing sterile nail nipper and currette. Border(s) cleansed with alcohol and TAO applied. Patient instructed to apply triple antibiotic ointment  to right great toe once daily for 7 days. Call office if there are any concerns. ?-Patient/POA to call should there be question/concern in the interim.  ? ?Return in about 3 months (around 11/18/2021). ? ?Marzetta Board, DPM  ?

## 2021-08-27 ENCOUNTER — Ambulatory Visit: Payer: Medicare Other | Admitting: Nurse Practitioner

## 2021-08-27 NOTE — Patient Instructions (Incomplete)
Diabetes Mellitus and Foot Care Foot care is an important part of your health, especially when you have diabetes. Diabetes may cause you to have problems because of poor blood flow (circulation) to your feet and legs, which can cause your skin to: Become thinner and drier. Break more easily. Heal more slowly. Peel and crack. You may also have nerve damage (neuropathy) in your legs and feet, causing decreased feeling in them. This means that you may not notice minor injuries to your feet that could lead to more serious problems. Noticing and addressing any potential problems early is the best way to prevent future foot problems. How to care for your feet Foot hygiene  Wash your feet daily with warm water and mild soap. Do not use hot water. Then, pat your feet and the areas between your toes until they are completely dry. Do not soak your feet as this can dry your skin. Trim your toenails straight across. Do not dig under them or around the cuticle. File the edges of your nails with an emery board or nail file. Apply a moisturizing lotion or petroleum jelly to the skin on your feet and to dry, brittle toenails. Use lotion that does not contain alcohol and is unscented. Do not apply lotion between your toes. Shoes and socks Wear clean socks or stockings every day. Make sure they are not too tight. Do not wear knee-high stockings since they may decrease blood flow to your legs. Wear shoes that fit properly and have enough cushioning. Always look in your shoes before you put them on to be sure there are no objects inside. To break in new shoes, wear them for just a few hours a day. This prevents injuries on your feet. Wounds, scrapes, corns, and calluses  Check your feet daily for blisters, cuts, bruises, sores, and redness. If you cannot see the bottom of your feet, use a mirror or ask someone for help. Do not cut corns or calluses or try to remove them with medicine. If you find a minor scrape,  cut, or break in the skin on your feet, keep it and the skin around it clean and dry. You may clean these areas with mild soap and water. Do not clean the area with peroxide, alcohol, or iodine. If you have a wound, scrape, corn, or callus on your foot, look at it several times a day to make sure it is healing and not infected. Check for: Redness, swelling, or pain. Fluid or blood. Warmth. Pus or a bad smell. General tips Do not cross your legs. This may decrease blood flow to your feet. Do not use heating pads or hot water bottles on your feet. They may burn your skin. If you have lost feeling in your feet or legs, you may not know this is happening until it is too late. Protect your feet from hot and cold by wearing shoes, such as at the beach or on hot pavement. Schedule a complete foot exam at least once a year (annually) or more often if you have foot problems. Report any cuts, sores, or bruises to your health care provider immediately. Where to find more information American Diabetes Association: www.diabetes.org Association of Diabetes Care & Education Specialists: www.diabeteseducator.org Contact a health care provider if: You have a medical condition that increases your risk of infection and you have any cuts, sores, or bruises on your feet. You have an injury that is not healing. You have redness on your legs or feet. You   feel burning or tingling in your legs or feet. You have pain or cramps in your legs and feet. Your legs or feet are numb. Your feet always feel cold. You have pain around any toenails. Get help right away if: You have a wound, scrape, corn, or callus on your foot and: You have pain, swelling, or redness that gets worse. You have fluid or blood coming from the wound, scrape, corn, or callus. Your wound, scrape, corn, or callus feels warm to the touch. You have pus or a bad smell coming from the wound, scrape, corn, or callus. You have a fever. You have a red  line going up your leg. Summary Check your feet every day for blisters, cuts, bruises, sores, and redness. Apply a moisturizing lotion or petroleum jelly to the skin on your feet and to dry, brittle toenails. Wear shoes that fit properly and have enough cushioning. If you have foot problems, report any cuts, sores, or bruises to your health care provider immediately. Schedule a complete foot exam at least once a year (annually) or more often if you have foot problems. This information is not intended to replace advice given to you by your health care provider. Make sure you discuss any questions you have with your health care provider. Document Revised: 10/19/2019 Document Reviewed: 10/19/2019 Elsevier Patient Education  2023 Elsevier Inc.  

## 2021-09-02 ENCOUNTER — Ambulatory Visit: Payer: Medicare Other | Admitting: Family Medicine

## 2021-09-09 ENCOUNTER — Other Ambulatory Visit: Payer: Self-pay | Admitting: Family Medicine

## 2021-09-09 ENCOUNTER — Ambulatory Visit: Payer: Medicare Other | Admitting: Family Medicine

## 2021-09-09 ENCOUNTER — Other Ambulatory Visit: Payer: Self-pay | Admitting: Nurse Practitioner

## 2021-09-10 ENCOUNTER — Encounter: Payer: Self-pay | Admitting: Family Medicine

## 2021-09-10 ENCOUNTER — Ambulatory Visit (INDEPENDENT_AMBULATORY_CARE_PROVIDER_SITE_OTHER): Payer: Medicare Other | Admitting: Family Medicine

## 2021-09-10 VITALS — BP 135/73 | HR 98 | Resp 16 | Ht 70.0 in | Wt 364.0 lb

## 2021-09-10 DIAGNOSIS — E669 Obesity, unspecified: Secondary | ICD-10-CM | POA: Diagnosis not present

## 2021-09-10 DIAGNOSIS — I10 Essential (primary) hypertension: Secondary | ICD-10-CM | POA: Diagnosis not present

## 2021-09-10 DIAGNOSIS — E1169 Type 2 diabetes mellitus with other specified complication: Secondary | ICD-10-CM | POA: Diagnosis not present

## 2021-09-10 DIAGNOSIS — E782 Mixed hyperlipidemia: Secondary | ICD-10-CM

## 2021-09-10 DIAGNOSIS — I5189 Other ill-defined heart diseases: Secondary | ICD-10-CM

## 2021-09-10 LAB — GLUCOSE, POCT (MANUAL RESULT ENTRY): POC Glucose: 162 mg/dl — AB (ref 70–99)

## 2021-09-10 NOTE — Patient Instructions (Addendum)
F/u in 5 months, call if you need me sooner, foot exam at visit  You are referred for eye exam  Please get shingrix vaccines at your pharmacy if able  It is important that you exercise regularly at least 30 minutes 5 times a week. If you develop chest pain, have severe difficulty breathing, or feel very tired, stop exercising immediately and seek medical attention   Think about what you will eat, plan ahead. Choose " clean, green, fresh or frozen" over canned, processed or packaged foods which are more sugary, salty and fatty. 70 to 75% of food eaten should be vegetables and fruit. Three meals at set times with snacks allowed between meals, but they must be fruit or vegetables. Aim to eat over a 12 hour period , example 7 am to 7 pm, and STOP after  your last meal of the day. Drink water,generally about 64 ounces per day, no other drink is as healthy. Fruit juice is best enjoyed in a healthy way, by EATING the fruit.   Thanks for choosing [facility], we consider it a privelige to serve you.

## 2021-09-10 NOTE — Assessment & Plan Note (Addendum)
Followed by Endo, has upcoming appt, reports recurrent hypoglycemia, last HBa1C was 6.5, dose adjustment necessary [name] is reminded of the importance of commitment to daily physical activity for 30 minutes or more, as able and the need to limit carbohydrate intake to 30 to 60 grams per meal to help with blood sugar control.   The need to take medication as prescribed, test blood sugar as directed, and to call between visits if there is a concern that blood sugar is uncontrolled is also discussed.   [name] is reminded of the importance of daily foot exam, annual eye examination, and good blood sugar, blood pressure and cholesterol control.     Latest Ref Rng & Units 06/24/2021    4:16 PM 05/22/2021    2:25 PM 05/22/2021    2:08 PM 05/22/2021    2:07 PM 02/13/2021    1:20 PM  Diabetic Labs  HbA1c 4.0 - 5.6 %  6.5    6.5   7.7    Microalbumin mg/L   30      Micro/Creat Ratio    <30      Chol 100 - 199 mg/dL 153        HDL >39 mg/dL 41        Calc LDL 0 - 99 mg/dL 86        Triglycerides 0 - 149 mg/dL 146        Creatinine 0.57 - 1.00 mg/dL 0.72            09/10/2021    3:51 PM 06/24/2021    3:07 PM 05/22/2021    1:48 PM 02/13/2021    1:03 PM 02/07/2021    3:11 PM 12/05/2020    9:37 AM 12/05/2020    8:35 AM  BP/Weight  Systolic BP 672 094 709 628  366 294  Diastolic BP 73 77 71 67  70 81  Wt. (Lbs) 364 374 376 379.6 382 392 391.8  BMI 52.23 kg/m2 53.66 kg/m2 53.95 kg/m2 54.47 kg/m2 54.81 kg/m2 56.25 kg/m2 56.22 kg/m2      Latest Ref Rng & Units 02/13/2019   11:00 AM 08/19/2016   12:00 AM  Foot/eye exam completion dates  Eye Exam No Retinopathy  No Retinopathy        No Retinopathy       Foot Form Completion  Done      This result is from an external source.   Multiple values from one day are sorted in reverse-chronological order

## 2021-09-14 ENCOUNTER — Encounter: Payer: Self-pay | Admitting: Family Medicine

## 2021-09-14 NOTE — Progress Notes (Signed)
Tracey [name]     MRN: [id number]      DOB: 07-28-1955   HPI Tracey [name] is here for follow up and re-evaluation of chronic medical conditions, medication management and review of any available recent lab and radiology data.  Preventive health is updated, specifically  Cancer screening and Immunization.   Has upcoming Endo appt, but is concerned about multiple episodes of very low blood sugar , and states she often misses dose of insulin based on blood sugar readings she is getting.  C/o excess fatigue,  Reduce appetite with ozempic and has a 30 pound weight loss in past 10 monhts ROS Denies recent fever or chills. Denies sinus pressure, nasal congestion, ear pain or sore throat. Denies chest congestion, productive cough or wheezing. Denies chest pains, palpitations and leg swelling Denies abdominal pain, nausea, vomiting,diarrhea or constipation.   Denies dysuria, frequency, hesitancy or incontinence. Denies uncontrolled  joint pain, swelling and limitation in mobility. Denies headaches, seizures, numbness, or tingling. Denies depression, anxiety or insomnia. Denies skin break down or rash.   PE  BP 135/73   Pulse 98   Resp 16   Ht '5\' 10"'$  (1.778 m)   Wt (!) 364 lb (165.1 kg)   SpO2 97%   BMI 52.23 kg/m   Patient alert and oriented and in no cardiopulmonary distress.  HEENT: No facial asymmetry, EOMI,     Neck supple .  Chest: Clear to auscultation bilaterally.  CVS: S1, S2 no murmurs, no S3.Regular rate.  ABD: Soft non tender.   Ext: No edema  MS: decreased  ROM spine, shoulders, hips and knees.  Skin: Intact, no ulcerations or rash noted.  Psych: Good eye contact, normal affect. Memory intact not anxious or depressed appearing.  CNS: CN 2-12 intact, power,  normal throughout.no focal deficits noted.   Assessment & Plan  Diabetes mellitus type 2 in obese ([place]) Followed by Endo, has upcoming appt, reports recurrent hypoglycemia, last HBa1C was 6.5, dose  adjustment necessary Tracey [name] is reminded of the importance of commitment to daily physical activity for 30 minutes or more, as able and the need to limit carbohydrate intake to 30 to 60 grams per meal to help with blood sugar control.   The need to take medication as prescribed, test blood sugar as directed, and to call between visits if there is a concern that blood sugar is uncontrolled is also discussed.   Tracey [name] is reminded of the importance of daily foot exam, annual eye examination, and good blood sugar, blood pressure and cholesterol control.     Latest Ref Rng & Units 06/24/2021    4:16 PM 05/22/2021    2:25 PM 05/22/2021    2:08 PM 05/22/2021    2:07 PM 02/13/2021    1:20 PM  Diabetic Labs  HbA1c 4.0 - 5.6 %  6.5    6.5   7.7    Microalbumin mg/L   30      Micro/Creat Ratio    <30      Chol 100 - 199 mg/dL 153        HDL >39 mg/dL 41        Calc LDL 0 - 99 mg/dL 86        Triglycerides 0 - 149 mg/dL 146        Creatinine 0.57 - 1.00 mg/dL 0.72            09/10/2021    3:51 PM 06/24/2021    3:07  PM 05/22/2021    1:48 PM 02/13/2021    1:03 PM 02/07/2021    3:11 PM 12/05/2020    9:37 AM 12/05/2020    8:35 AM  BP/Weight  Systolic BP 093 235 573 220  254 270  Diastolic BP 73 77 71 67  70 81  Wt. (Lbs) 364 374 376 379.6 382 392 391.8  BMI 52.23 kg/m2 53.66 kg/m2 53.95 kg/m2 54.47 kg/m2 54.81 kg/m2 56.25 kg/m2 56.22 kg/m2      Latest Ref Rng & Units 02/13/2019   11:00 AM 08/19/2016   12:00 AM  Foot/eye exam completion dates  Eye Exam No Retinopathy  No Retinopathy        No Retinopathy       Foot Form Completion  Done      This result is from an external source.   Multiple values from one day are sorted in reverse-chronological order         Essential hypertension, benign Controlled, no change in medication DASH diet and commitment to daily physical activity for a minimum of 30 minutes discussed and encouraged, as a part of hypertension management. The importance  of attaining a healthy weight is also discussed.     09/10/2021    3:51 PM 06/24/2021    3:07 PM 05/22/2021    1:48 PM 02/13/2021    1:03 PM 02/07/2021    3:11 PM 12/05/2020    9:37 AM 12/05/2020    8:35 AM  BP/Weight  Systolic BP 623 762 831 517  616 073  Diastolic BP 73 77 71 67  70 81  Wt. (Lbs) 364 374 376 379.6 382 392 391.8  BMI 52.23 kg/m2 53.66 kg/m2 53.95 kg/m2 54.47 kg/m2 54.81 kg/m2 56.25 kg/m2 56.22 kg/m2       Mixed hyperlipidemia Hyperlipidemia:Low fat diet discussed and encouraged.   Lipid Panel  Lab Results  Component Value Date   CHOL 153 06/24/2021   HDL 41 06/24/2021   LDLCALC 86 06/24/2021   TRIG 146 06/24/2021   CHOLHDL 3.7 06/24/2021   Controlled, no change in medication     Morbid obesity Improved, gradual consitent weight loss, applauded on this  Patient re-educated about  the importance of commitment to a  minimum of 150 minutes of exercise per week as able.  The importance of healthy food choices with portion control discussed, as well as eating regularly and within a 12 hour window most days. The need to choose "clean , green" food 50 to 75% of the time is discussed, as well as to make water the primary drink and set a goal of 64 ounces water daily.       09/10/2021    3:51 PM 06/24/2021    3:07 PM 05/22/2021    1:48 PM  Weight /BMI  Weight 364 lb 374 lb 376 lb  Height '5\' 10"'$  (1.778 m) '5\' 10"'$  (1.778 m) '5\' 10"'$  (1.778 m)  BMI 52.23 kg/m2 53.66 kg/m2 53.95 kg/m2      Diastolic dysfunction Stable and controlled, no s/s of decompensation, continue to follow closely with Cardiology

## 2021-09-14 NOTE — Assessment & Plan Note (Signed)
Hyperlipidemia:Low fat diet discussed and encouraged.   Lipid Panel  Lab Results  Component Value Date   CHOL 153 06/24/2021   HDL 41 06/24/2021   LDLCALC 86 06/24/2021   TRIG 146 06/24/2021   CHOLHDL 3.7 06/24/2021     Controlled, no change in medication  

## 2021-09-14 NOTE — Assessment & Plan Note (Signed)
Controlled, no change in medication DASH diet and commitment to daily physical activity for a minimum of 30 minutes discussed and encouraged, as a part of hypertension management. The importance of attaining a healthy weight is also discussed.     09/10/2021    3:51 PM 06/24/2021    3:07 PM 05/22/2021    1:48 PM 02/13/2021    1:03 PM 02/07/2021    3:11 PM 12/05/2020    9:37 AM 12/05/2020    8:35 AM  BP/Weight  Systolic BP 025 427 062 376  283 151  Diastolic BP 73 77 71 67  70 81  Wt. (Lbs) 364 374 376 379.6 382 392 391.8  BMI 52.23 kg/m2 53.66 kg/m2 53.95 kg/m2 54.47 kg/m2 54.81 kg/m2 56.25 kg/m2 56.22 kg/m2

## 2021-09-14 NOTE — Assessment & Plan Note (Signed)
Stable and controlled, no s/s of decompensation, continue to follow closely with Cardiology

## 2021-09-14 NOTE — Assessment & Plan Note (Signed)
Improved, gradual consitent weight loss, applauded on this  Patient re-educated about  the importance of commitment to a  minimum of 150 minutes of exercise per week as able.  The importance of healthy food choices with portion control discussed, as well as eating regularly and within a 12 hour window most days. The need to choose "clean , green" food 50 to 75% of the time is discussed, as well as to make water the primary drink and set a goal of 64 ounces water daily.       09/10/2021    3:51 PM 06/24/2021    3:07 PM 05/22/2021    1:48 PM  Weight /BMI  Weight 364 lb 374 lb 376 lb  Height '5\' 10"'$  (1.778 m) '5\' 10"'$  (1.778 m) '5\' 10"'$  (1.778 m)  BMI 52.23 kg/m2 53.66 kg/m2 53.95 kg/m2

## 2021-09-15 ENCOUNTER — Encounter: Payer: Self-pay | Admitting: Nurse Practitioner

## 2021-09-15 ENCOUNTER — Ambulatory Visit (INDEPENDENT_AMBULATORY_CARE_PROVIDER_SITE_OTHER): Payer: Medicare Other | Admitting: Nurse Practitioner

## 2021-09-15 VITALS — BP 148/85 | HR 87 | Ht 70.0 in | Wt 368.0 lb

## 2021-09-15 DIAGNOSIS — I10 Essential (primary) hypertension: Secondary | ICD-10-CM | POA: Diagnosis not present

## 2021-09-15 DIAGNOSIS — Z794 Long term (current) use of insulin: Secondary | ICD-10-CM | POA: Diagnosis not present

## 2021-09-15 DIAGNOSIS — E782 Mixed hyperlipidemia: Secondary | ICD-10-CM

## 2021-09-15 DIAGNOSIS — E119 Type 2 diabetes mellitus without complications: Secondary | ICD-10-CM

## 2021-09-15 DIAGNOSIS — E559 Vitamin D deficiency, unspecified: Secondary | ICD-10-CM | POA: Diagnosis not present

## 2021-09-15 DIAGNOSIS — E1165 Type 2 diabetes mellitus with hyperglycemia: Secondary | ICD-10-CM | POA: Diagnosis not present

## 2021-09-15 LAB — POCT GLYCOSYLATED HEMOGLOBIN (HGB A1C): HbA1c, POC (controlled diabetic range): 6.4 % (ref 0.0–7.0)

## 2021-09-15 MED ORDER — HUMULIN R U-500 KWIKPEN 500 UNIT/ML ~~LOC~~ SOPN: PEN_INJECTOR | SUBCUTANEOUS | 2 refills | Status: DC

## 2021-09-15 NOTE — Patient Instructions (Signed)
Diabetes Mellitus and Foot Care Foot care is an important part of your health, especially when you have diabetes. Diabetes may cause you to have problems because of poor blood flow (circulation) to your feet and legs, which can cause your skin to: Become thinner and drier. Break more easily. Heal more slowly. Peel and crack. You may also have nerve damage (neuropathy) in your legs and feet, causing decreased feeling in them. This means that you may not notice minor injuries to your feet that could lead to more serious problems. Noticing and addressing any potential problems early is the best way to prevent future foot problems. How to care for your feet Foot hygiene  Wash your feet daily with warm water and mild soap. Do not use hot water. Then, pat your feet and the areas between your toes until they are completely dry. Do not soak your feet as this can dry your skin. Trim your toenails straight across. Do not dig under them or around the cuticle. File the edges of your nails with an emery board or nail file. Apply a moisturizing lotion or petroleum jelly to the skin on your feet and to dry, brittle toenails. Use lotion that does not contain alcohol and is unscented. Do not apply lotion between your toes. Shoes and socks Wear clean socks or stockings every day. Make sure they are not too tight. Do not wear knee-high stockings since they may decrease blood flow to your legs. Wear shoes that fit properly and have enough cushioning. Always look in your shoes before you put them on to be sure there are no objects inside. To break in new shoes, wear them for just a few hours a day. This prevents injuries on your feet. Wounds, scrapes, corns, and calluses  Check your feet daily for blisters, cuts, bruises, sores, and redness. If you cannot see the bottom of your feet, use a mirror or ask someone for help. Do not cut corns or calluses or try to remove them with medicine. If you find a minor scrape,  cut, or break in the skin on your feet, keep it and the skin around it clean and dry. You may clean these areas with mild soap and water. Do not clean the area with peroxide, alcohol, or iodine. If you have a wound, scrape, corn, or callus on your foot, look at it several times a day to make sure it is healing and not infected. Check for: Redness, swelling, or pain. Fluid or blood. Warmth. Pus or a bad smell. General tips Do not cross your legs. This may decrease blood flow to your feet. Do not use heating pads or hot water bottles on your feet. They may burn your skin. If you have lost feeling in your feet or legs, you may not know this is happening until it is too late. Protect your feet from hot and cold by wearing shoes, such as at the beach or on hot pavement. Schedule a complete foot exam at least once a year (annually) or more often if you have foot problems. Report any cuts, sores, or bruises to your health care provider immediately. Where to find more information American Diabetes Association: www.diabetes.org [employer] Education [employer]ists: www.diabeteseducator.org Contact a health care provider if: You have a medical condition that increases your risk of infection and you have any cuts, sores, or bruises on your feet. You have an injury that is not healing. You have redness on your legs or feet. You   feel burning or tingling in your legs or feet. You have pain or cramps in your legs and feet. Your legs or feet are numb. Your feet always feel cold. You have pain around any toenails. Get help right away if: You have a wound, scrape, corn, or callus on your foot and: You have pain, swelling, or redness that gets worse. You have fluid or blood coming from the wound, scrape, corn, or callus. Your wound, scrape, corn, or callus feels warm to the touch. You have pus or a bad smell coming from the wound, scrape, corn, or callus. You have a fever. You have a red  line going up your leg. Summary Check your feet every day for blisters, cuts, bruises, sores, and redness. Apply a moisturizing lotion or petroleum jelly to the skin on your feet and to dry, brittle toenails. Wear shoes that fit properly and have enough cushioning. If you have foot problems, report any cuts, sores, or bruises to your health care provider immediately. Schedule a complete foot exam at least once a year (annually) or more often if you have foot problems. This information is not intended to replace advice given to you by your health care provider. Make sure you discuss any questions you have with your health care provider. Document Revised: 10/19/2019 Document Reviewed: 10/19/2019 [employer] Patient Education  2023 [employer]  

## 2021-09-15 NOTE — Progress Notes (Signed)
09/15/2021, 3:24 PM   Endocrinology Follow Up Visit    Subjective:    Patient ID: Tracey [name], female    DOB: Aug 05, 1955.  Tracey [name] is being engaged in follow-up for management of currently uncontrolled symptomatic type 2 diabetes, hyperlipidemia, hypertension.    PMD:   Fayrene Helper, MD.  Past Medical History:  Diagnosis Date   Arthritis    Diabetes mellitus without complication ([place])    Diastolic dysfunction 04/930   Grade 1   GERD (gastroesophageal reflux disease)    Hyperlipidemia    Hyperlipidemia    Hypertension 04/15/2012   Morbid obesity ([place])    PE (pulmonary embolism)    Pulmonary emboli ([place]) 1999   post TAH   Sleep apnea    Past Surgical History:  Procedure Laterality Date   ABDOMINAL HYSTERECTOMY  1999   , totalfor fibroids   CESAREAN SECTION     CHOLECYSTECTOMY  1996   COLONOSCOPY N/A 10/31/2015   Procedure: COLONOSCOPY;  Surgeon: Rogene Houston, MD;  [facility];  Service: Endoscopy;  Laterality: N/A;  930   FOOT SURGERY Right    October 2022   Social History   Socioeconomic History   Marital status: Single    Spouse name: Not on file   Number of children: Not on file   Years of education: Not on file   Highest education level: Not on file  Occupational History   Not on file  Tobacco Use   Smoking status: Never   Smokeless tobacco: Never  Vaping Use   Vaping Use: Never used  Substance and Sexual Activity   Alcohol use: No    Alcohol/week: 0.0 standard drinks   Drug use: No   Sexual activity: Not Currently  Other Topics Concern   Not on file  Social History Narrative   Not on file   Social Determinants of Health   Financial Resource Strain: Low Risk    Difficulty of Paying Living Expenses: Not hard at all  Food Insecurity: No Food Insecurity   Worried About Charity fundraiser in the  Last Year: Never true   [place] in the Last Year: Never true  Transportation Needs: No Transportation Needs   Lack of Transportation (Medical): No   Lack of Transportation (Non-Medical): No  Physical Activity: Inactive   Days of Exercise per Week: 0 days   Minutes of Exercise per Session: 0 min  Stress: No Stress Concern Present   Feeling of Stress : Not at all  Social Connections: Moderately Isolated   Frequency of Communication with Friends and Family: More than three times a week   Frequency of Social Gatherings with Friends and Family: More than three times a week   Attends Religious Services: 1 to 4 times per year   Active Member of [employer] or Organizations: No   Attends Archivist Meetings: Never   Marital Status: Divorced  Outpatient Encounter Medications as of 09/15/2021  Medication Sig   acetaminophen (TYLENOL) 500 MG tablet Take 500 mg by mouth every 6 (six) hours as needed for moderate pain.   aspirin EC 81 MG tablet Take 1 tablet (81 mg total) by mouth daily.   blood glucose meter kit and supplies KIT Dispense based on patient and insurance preference. Use up to four times daily as directed.   Cholecalciferol (VITAMIN D-3) 125 MCG (5000 UT) TABS Take 5,000 Units by mouth daily.   glucose blood (ACCU-CHEK GUIDE) test strip USE 1 STRIP TO CHECK GLUCOSE 3 TIMES DAILY AS DIRECTED   insulin regular human CONCENTRATED (HUMULIN R U-500 KWIKPEN) 500 UNIT/ML KwikPen INJECT 35 UNITS INTO THE SKIN 3 TIMES DAILY WITH MEALS.   Lancets MISC Use to test BG qid. Dx : E11.65. Softclix Lancets   metFORMIN (GLUCOPHAGE) 1000 MG tablet TAKE ONE TABLET BY MOUTH TWICE A DAY WITH A MEAL   metoprolol tartrate (LOPRESSOR) 50 MG tablet TAKE ONE TABLET (50MG TOTAL) BY MOUTH TWO TIMES DAILY   olmesartan (BENICAR) 20 MG tablet Take 1 tablet (20 mg total) by mouth daily.   omeprazole (PRILOSEC) 20 MG capsule TAKE ONE CAPSULE BY MOUTH DAILY   rosuvastatin (CRESTOR) 20 MG tablet TAKE ONE  TABLET (20MG TOTAL) BY MOUTH DAILY   Semaglutide,0.25 or 0.5MG/DOS, (OZEMPIC, 0.25 OR 0.5 MG/DOSE,) 2 MG/1.5ML SOPN Inject 0.5 mg into the skin once a week.   spironolactone (ALDACTONE) 25 MG tablet TAKE ONE (1) TABLET BY MOUTH EVERY DAY   UNIFINE PENTIPS 31G X 8 MM MISC USE FOR INSULIN INJECTIONS THREE TIMES DAILY.   [DISCONTINUED] glipiZIDE (GLUCOTROL XL) 5 MG 24 hr tablet Take 1 tablet (5 mg total) by mouth daily with breakfast.   [DISCONTINUED] insulin regular human CONCENTRATED (HUMULIN R U-500 KWIKPEN) 500 UNIT/ML KwikPen INJECT 55 UNITS INTO THE SKIN 3 TIMES DAILY WITH MEALS.   No facility-administered encounter medications on file as of 09/15/2021.    ALLERGIES: Allergies  Allergen Reactions   Haloperidol Lactate Anaphylaxis   Lipitor [Atorvastatin Calcium] Other (See Comments)    Markedly elevated liver enzymes    VACCINATION STATUS: Immunization History  Administered Date(s) Administered   Influenza,inj,Quad PF,6+ Mos 04/01/2015, 03/16/2016, 01/20/2017, 04/11/2018, 02/13/2019, 01/31/2020, 12/05/2020   Moderna Sars-Covid-2 Vaccination 12/13/2019, 01/11/2020   PNEUMOCOCCAL CONJUGATE-20 12/05/2020   Pneumococcal Polysaccharide-23 05/13/2015    Diabetes She presents for her follow-up diabetic visit. She has type 2 diabetes mellitus. Onset time: He was diagnosed at approximate age of 41 years. Her disease course has been improving. Hypoglycemia symptoms include hunger, nervousness/anxiousness, sleepiness, sweats and tremors. Pertinent negatives for hypoglycemia include no confusion, headaches, pallor or seizures. Associated symptoms include fatigue and weight loss. Pertinent negatives for diabetes include no blurred vision, no chest pain, no polydipsia, no polyphagia and no polyuria. There are no hypoglycemic complications. Symptoms are stable. Diabetic complications include heart disease and nephropathy. Risk factors for coronary artery disease include diabetes mellitus, dyslipidemia,  family history, hypertension, obesity, post-menopausal and sedentary lifestyle. Current diabetic treatment includes intensive insulin program and oral agent (dual therapy). She is compliant with treatment most of the time. Her weight is fluctuating minimally. She is following a generally healthy diet. When asked about meal planning, she reported none. She has had a previous visit with a dietitian. She never participates in exercise. Her home blood glucose trend is decreasing steadily. Her overall blood glucose range is 70-90 mg/dl. (She presents today with her CGM and logs showing tight glycemic profile overall.  Her POCT A1c today is 6.4%, essentially unchanged from previous visit.  She has had frequent hypoglycemia, has fatigue and drowsiness mostly as symptoms.  Analysis of her CGM shows TIR 68%, TBR 32%, TAR 0% with a GMI of 5.2%. She did not call between visits for low readings.) An ACE inhibitor/angiotensin II receptor blocker is not being taken. She does not see a podiatrist.Eye exam is not current.  Hyperlipidemia This is a chronic problem. The current episode started more than 1 year ago. The problem is uncontrolled. Recent lipid tests were reviewed and are high. Exacerbating diseases include chronic renal disease, diabetes and obesity. Factors aggravating her hyperlipidemia include fatty foods. Pertinent negatives include no chest pain, myalgias or shortness of breath. Current antihyperlipidemic treatment includes statins. The current treatment provides moderate improvement of lipids. Compliance problems include adherence to diet and adherence to exercise.  Risk factors for coronary artery disease include diabetes mellitus, dyslipidemia, hypertension, obesity, a sedentary lifestyle and post-menopausal.  Hypertension This is a chronic problem. The current episode started more than 1 year ago. The problem is unchanged. The problem is uncontrolled. Associated symptoms include sweats. Pertinent negatives  include no blurred vision, chest pain, headaches, palpitations or shortness of breath. There are no associated agents to hypertension. Risk factors for coronary artery disease include dyslipidemia, diabetes mellitus, obesity, sedentary lifestyle and post-menopausal state. Past treatments include beta blockers and diuretics. The current treatment provides moderate improvement. Compliance problems include exercise and diet.  Hypertensive end-organ damage includes kidney disease. Identifiable causes of hypertension include chronic renal disease.    Review of systems  Constitutional: + Minimally fluctuating body weight,  current Body mass index is 52.8 kg/m. , no fatigue, no subjective hyperthermia, no subjective hypothermia Eyes: no blurry vision, no xerophthalmia ENT: no sore throat, no nodules palpated in throat, no dysphagia/odynophagia, no hoarseness Cardiovascular: no chest pain, no shortness of breath, no palpitations, no leg swelling Respiratory: no cough, no shortness of breath Gastrointestinal: no nausea/vomiting/diarrhea Musculoskeletal: no muscle/joint aches Skin: no rashes, no hyperemia Neurological: no tremors, no numbness, no tingling, no dizziness Psychiatric: no depression, no anxiety    Objective:    BP (!) 148/85   Pulse 87   Ht 5' 10"  (1.778 m)   Wt (!) 368 lb (166.9 kg)   BMI 52.80 kg/m   Wt Readings from Last 3 Encounters:  09/15/21 (!) 368 lb (166.9 kg)  09/10/21 (!) 364 lb (165.1 kg)  06/24/21 (!) 374 lb (169.6 kg)     BP Readings from Last 3 Encounters:  09/15/21 (!) 148/85  09/10/21 135/73  06/24/21 (!) 146/77     Physical Exam- Limited  Constitutional:  Body mass index is 52.8 kg/m. , not in acute distress, normal state of mind Eyes:  EOMI, no exophthalmos Neck: Supple Cardiovascular: RRR, no murmurs, rubs, or gallops, no edema Respiratory: Adequate breathing efforts, no crackles, rales, rhonchi, or wheezing Musculoskeletal: no gross deformities,  strength intact in all four extremities, no gross restriction of joint movements Skin:  no rashes, no hyperemia Neurological: no tremor with outstretched hands    CMP     Component Value Date/Time   NA 140 06/24/2021 1616   K 4.2 06/24/2021 1616   CL 100 06/24/2021 1616   CO2 23 06/24/2021 1616   GLUCOSE 138 (H) 06/24/2021 1616   GLUCOSE 148 (H) 12/21/2019 1252   BUN 8 06/24/2021 1616   CREATININE 0.72 06/24/2021 1616   CREATININE 0.71 12/21/2019 1252   CALCIUM 9.8 06/24/2021 1616  PROT 7.9 06/24/2021 1616   ALBUMIN 4.6 06/24/2021 1616   AST 23 06/24/2021 1616   ALT 21 06/24/2021 1616   ALKPHOS 65 06/24/2021 1616   BILITOT 0.5 06/24/2021 1616   GFRNONAA 77 02/13/2019 1207   GFRAA 90 02/13/2019 1207    Diabetic Labs (most recent): Lab Results  Component Value Date   HGBA1C 6.4 09/15/2021   HGBA1C 6.5 (A) 05/22/2021   HGBA1C 6.5 05/22/2021    Lipid Panel     Component Value Date/Time   CHOL 153 06/24/2021 1616   TRIG 146 06/24/2021 1616   HDL 41 06/24/2021 1616   CHOLHDL 3.7 06/24/2021 1616   CHOLHDL 3.5 02/01/2020 1415   VLDL 23 07/13/2016 1430   LDLCALC 86 06/24/2021 1616   LDLCALC 75 02/01/2020 1415      Lab Results  Component Value Date   TSH 0.955 06/24/2021   TSH 0.727 12/05/2020   TSH 0.69 02/13/2019   TSH 1.23 05/09/2018   TSH 0.95 07/13/2016   TSH 1.26 07/10/2015   TSH 1.382 11/17/2013   TSH 0.519 04/14/2012   TSH 1.525 11/07/2009      Lipid Panel     Component Value Date/Time   CHOL 153 06/24/2021 1616   TRIG 146 06/24/2021 1616   HDL 41 06/24/2021 1616   CHOLHDL 3.7 06/24/2021 1616   CHOLHDL 3.5 02/01/2020 1415   VLDL 23 07/13/2016 1430   LDLCALC 86 06/24/2021 1616   LDLCALC 75 02/01/2020 1415     Assessment & Plan:   1) Uncontrolled type 2 diabetes mellitus with hyperglycemia (HCC)  - Mikalah D Bartoli has currently uncontrolled symptomatic type 2 DM since 66 years of age.  She presents today with her CGM and logs showing  tight glycemic profile overall.  Her POCT A1c today is 6.4%, essentially unchanged from previous visit.  She has had frequent hypoglycemia, has fatigue and drowsiness mostly as symptoms.  Analysis of her CGM shows TIR 68%, TBR 32%, TAR 0% with a GMI of 5.2%. She did not call between visits for low readings.  -her diabetes is complicated by obesity/sedentary life and MINETTA KRISHER remains at a high risk for more acute and chronic complications which include CAD, CVA, CKD, retinopathy, and neuropathy. These are all discussed in detail with the patient.  - Nutritional counseling repeated at each appointment due to patients tendency to fall back in to old habits.  - The patient admits there is a room for improvement in their diet and drink choices. -  Suggestion is made for the patient to avoid simple carbohydrates from their diet including Cakes, Sweet Desserts / Pastries, Ice Cream, Soda (diet and regular), Sweet Tea, Candies, Chips, Cookies, Sweet Pastries, Store Bought Juices, Alcohol in Excess of 1-2 drinks a day, Artificial Sweeteners, Coffee Creamer, and "Sugar-free" Products. This will help patient to have stable blood glucose profile and potentially avoid unintended weight gain.   - I encouraged the patient to switch to unprocessed or minimally processed complex starch and increased protein intake (animal or plant source), fruits, and vegetables.   - Patient is advised to stick to a routine mealtimes to eat 3 meals a day and avoid unnecessary snacks (to snack only to correct hypoglycemia).  - I have approached her with the following individualized plan to manage diabetes and patient agrees:   -Based on her improved glycemic profile overall, she is advised to lower her U500 to 35 units SQ TID with meals if glucose is above 90 and she  is eating.  She is advised to continue her Metformin 1000 mg po twice daily with meals and Ozempic 0.5 mg SQ weekly.  I did stop her Glipizide for now given  hypoglycemia.   -Encouraged patient to monitor blood glucose 4 times per day (using her CGM), before meals and at bedtime and notify the clinic if blood glucose levels are less than 70 or greater than 300 for 3 tests in a row.  She has benefited greatly from CGM device/.  -She is also advised not to inject insulin without proper glucose monitoring.  2) BP/HTN:  Her blood pressure is still not controlled to target but is improving.  She typically waits to take her Spironolactone 25 mg until she gets home so she won't have to urinate as often.  She is advised to continue Metoprolol 50 mg po twice daily and continue Spironolactone 25 mg po daily.    3) Lipids/HPL:  Her recent lipid panel from 06/24/21 shows controlled LDL of 86.  She is advised to continue Crestor 20 mg po daily at bedtime.  Side effects and precautions discussed with her.    4) Vitamin D Deficiency: Her recent vitamin D level was 86.  She has completed replenishment with ergocalciferol and is now taking OTC Vitamin D3 5000 units daily as maintenance dose.  She is advised to continue for now.    5) Chronic Care/Health Maintenance: -she is on Statin medications and is encouraged to continue to follow up with Ophthalmology, Dentist, Podiatrist at least yearly or according to recommendations, and advised to stay away from smoking. I have recommended yearly flu vaccine and pneumonia vaccination at least every 5 years; moderate intensity exercise for up to 150 minutes weekly; and  sleep for at least 7 hours a day.  - I advised patient to maintain close follow up with Fayrene Helper, MD for primary care needs.     I spent 44 minutes in the care of the patient today including review of labs from [place], Lipids, Thyroid Function, Hematology (current and previous including abstractions from other facilities); face-to-face time discussing  her blood glucose readings/logs, discussing hypoglycemia and hyperglycemia episodes and symptoms,  medications doses, her options of short and long term treatment based on the latest standards of care / guidelines;  discussion about incorporating lifestyle medicine;  and documenting the encounter.    Please refer to Patient Instructions for Blood Glucose Monitoring and Insulin/Medications Dosing Guide"  in media tab for additional information. Please  also refer to " Patient Self Inventory" in the Media  tab for reviewed elements of pertinent patient history.  Duane Boston participated in the discussions, expressed understanding, and voiced agreement with the above plans.  All questions were answered to her satisfaction. she is encouraged to contact clinic should she have any questions or concerns prior to her return visit.   Follow up plan: - Return in about 3 months (around 12/16/2021) for Diabetes F/U with A1c in office, No previsit labs, Bring meter and logs.   Rayetta Pigg, [facility] [address] [place], [place] [address] Phone: [phone] Fax: [phone]  09/15/2021, 3:24 PM

## 2021-09-17 ENCOUNTER — Ambulatory Visit (HOSPITAL_COMMUNITY): Payer: Medicare Other

## 2021-09-17 ENCOUNTER — Encounter (HOSPITAL_COMMUNITY): Payer: Medicare Other

## 2021-10-02 ENCOUNTER — Ambulatory Visit (HOSPITAL_COMMUNITY): Payer: Medicare Other

## 2021-10-02 ENCOUNTER — Inpatient Hospital Stay (HOSPITAL_COMMUNITY): Admission: RE | Admit: 2021-10-02 | Payer: Medicare Other | Source: Ambulatory Visit

## 2021-10-02 ENCOUNTER — Other Ambulatory Visit: Payer: Self-pay | Admitting: Family Medicine

## 2021-10-15 ENCOUNTER — Telehealth: Payer: Self-pay | Admitting: Nurse Practitioner

## 2021-10-15 MED ORDER — OZEMPIC (0.25 OR 0.5 MG/DOSE) 2 MG/1.5ML ~~LOC~~ SOPN: 0.5000 mg | PEN_INJECTOR | SUBCUTANEOUS | 3 refills | Status: DC

## 2021-10-15 NOTE — Telephone Encounter (Signed)
done

## 2021-10-15 NOTE — Telephone Encounter (Signed)
[facility] called and said that they need a new RX for her Ozempic with the new quantity.

## 2021-10-16 ENCOUNTER — Ambulatory Visit (HOSPITAL_COMMUNITY): Payer: Medicare Other

## 2021-10-16 ENCOUNTER — Encounter (HOSPITAL_COMMUNITY): Payer: Medicare Other

## 2021-10-16 ENCOUNTER — Other Ambulatory Visit (HOSPITAL_COMMUNITY): Payer: Medicare Other

## 2021-10-21 ENCOUNTER — Ambulatory Visit: Payer: Medicare Other | Admitting: Internal Medicine

## 2021-10-21 NOTE — Progress Notes (Deleted)
Cardiology Office Note    Date:  10/21/2021   ID:  EMMY KENG, DOB 07-29-1955, MRN [id number]   PCP:  Fayrene Helper, [place]  Cardiologist:  [name], MD   Advanced Practice Provider:  No care team member to display Electrophysiologist:  None   [id number]}   No chief complaint on file.   History of Present Illness:  Tracey [name] is a 66 y.o. female with history of HTN, HLD, DM, PE, morbid obesity, chest pain with normal Dobutamine echo 2014. Echo 2020 normal LVEF.  Patient last saw Dr. Harrington Challenger 12/05/20 and DOE. Was mildly tachycardic in the clinic so 24 hr monitor ordered. This showed NSR with short bursts of SVT longest 11 sec fasted 176 bpm. No changes made.    Past Medical History:  Diagnosis Date   Arthritis    Diabetes mellitus without complication (HCC)    Diastolic dysfunction 12/3233   Grade 1   GERD (gastroesophageal reflux disease)    Hyperlipidemia    Hyperlipidemia    Hypertension 04/15/2012   Morbid obesity ([place])    PE (pulmonary embolism)    Pulmonary emboli ([place]) 1999   post TAH   Sleep apnea     Past Surgical History:  Procedure Laterality Date   ABDOMINAL HYSTERECTOMY  1999   , totalfor fibroids   CESAREAN SECTION     CHOLECYSTECTOMY  1996   COLONOSCOPY N/A 10/31/2015   Procedure: COLONOSCOPY;  Surgeon: Rogene Houston, MD;  [facility];  Service: Endoscopy;  Laterality: N/A;  930   FOOT SURGERY Right    October 2022    Current Medications: No outpatient medications have been marked as taking for the 11/03/21 encounter (Appointment) with Imogene Burn, PA-C.     Allergies:   Haloperidol lactate and Lipitor [atorvastatin calcium]   Social History   Socioeconomic History   Marital status: Single    Spouse name: Not on file   Number of children: Not on file   Years of education: Not on file   Highest education level: Not on file  Occupational History   Not on file   Tobacco Use   Smoking status: Never   Smokeless tobacco: Never  Vaping Use   Vaping Use: Never used  Substance and Sexual Activity   Alcohol use: No    Alcohol/week: 0.0 standard drinks of alcohol   Drug use: No   Sexual activity: Not Currently  Other Topics Concern   Not on file  Social History Narrative   Not on file   Social Determinants of Health   Financial Resource Strain: Low Risk  (12/04/2020)   Overall Financial Resource Strain (CARDIA)    Difficulty of Paying Living Expenses: Not hard at all  Food Insecurity: No Food Insecurity (12/04/2020)   Hunger Vital Sign    Worried About Running Out of Food in the Last Year: Never true    [place] in the Last Year: Never true  Transportation Needs: No Transportation Needs (12/04/2020)   PRAPARE - Hydrologist (Medical): No    Lack of Transportation (Non-Medical): No  Physical Activity: Inactive (12/04/2020)   Exercise Vital Sign    Days of Exercise per Week: 0 days    Minutes of Exercise per Session: 0 min  Stress: No Stress Concern Present (12/04/2020)   [place]    Feeling of  Stress : Not at all  Social Connections: Moderately Isolated (12/04/2020)   Social Connection and Isolation Panel [NHANES]    Frequency of Communication with Friends and Family: More than three times a week    Frequency of Social Gatherings with Friends and Family: More than three times a week    Attends Religious Services: 1 to 4 times per year    Active Member of [employer] or Organizations: No    Attends Archivist Meetings: Never    Marital Status: Divorced     Family History:  The patient's ***family history includes Alcohol abuse in her father; Aneurysm in her brother; Arthritis in her sister; Cirrhosis in her father; Coronary artery disease in her sister; Depression in her mother and son; Diabetes in her mother; Heart attack (age of onset:  9) in her sister; Heart murmur in her son; Hyperlipidemia in her father; Hypertension in her father, mother, and son; Sleep apnea in her sister.   ROS:   Please see the history of present illness.    ROS All other systems reviewed and are negative.   PHYSICAL EXAM:   VS:  There were no vitals taken for this visit.  Physical Exam  GEN: Well nourished, well developed, in no acute distress  HEENT: normal  Neck: no JVD, carotid bruits, or masses Cardiac:RRR; no murmurs, rubs, or gallops  Respiratory:  clear to auscultation bilaterally, normal work of breathing GI: soft, nontender, nondistended, + BS Ext: without cyanosis, clubbing, or edema, Good distal pulses bilaterally MS: no deformity or atrophy  Skin: warm and dry, no rash Neuro:  Alert and Oriented x 3, Strength and sensation are intact Psych: euthymic mood, full affect  Wt Readings from Last 3 Encounters:  09/15/21 (!) 368 lb (166.9 kg)  09/10/21 (!) 364 lb (165.1 kg)  06/24/21 (!) 374 lb (169.6 kg)      Studies/Labs Reviewed:   EKG:  EKG is*** ordered today.  The ekg ordered today demonstrates ***  Recent Labs: 12/05/2020: Magnesium 1.8 06/24/2021: ALT 21; BUN 8; Creatinine, Ser 0.72; Hemoglobin 13.6; Platelets 343; Potassium 4.2; Sodium 140; TSH 0.955   Lipid Panel    Component Value Date/Time   CHOL 153 06/24/2021 1616   TRIG 146 06/24/2021 1616   HDL 41 06/24/2021 1616   CHOLHDL 3.7 06/24/2021 1616   CHOLHDL 3.5 02/01/2020 1415   VLDL 23 07/13/2016 1430   LDLCALC 86 06/24/2021 1616   LDLCALC 75 02/01/2020 1415    Additional studies/ records that were reviewed today include:  Monitor 12/2020    Patch Wear Time:  2 days and 22 hours (2022-08-25T10:29:56-0400 to 2022-08-28T08:50:37-0400)   Predominant rhythm:  Sinus rhythm  Rates 50 to 150 bpm   Average HR 89 bpm   Short bursts SVT   Longest 11 sec with fastest rate 176 bpm    Rare PVC, PAC      Risk Assessment/Calculations:   {Does this patient have  ATRIAL FIBRILLATION?:[id number]}     ASSESSMENT:    No diagnosis found.   PLAN:  In order of problems listed above:  HTN  HLD  SVT  History of PE  Morbid obesity  Shared Decision Making/Informed Consent   {Are you ordering a CV Procedure (e.g. stress test, cath, DCCV, TEE, etc)?   Press F2        :[id number]}    Medication Adjustments/Labs and Tests Ordered: Current medicines are reviewed at length with the patient today.  Concerns regarding medicines are outlined above.  Medication  changes, Labs and Tests ordered today are listed in the Patient Instructions below. There are no Patient Instructions on file for this visit.   Signed, Ermalinda Barrios, PA-C  10/21/2021 2:18 PM    [facility] [place], [place], [place]  [address] Phone: [phone]; Fax: [phone]

## 2021-11-03 ENCOUNTER — Ambulatory Visit: Payer: Medicare Other | Admitting: Physician Assistant

## 2021-11-11 ENCOUNTER — Ambulatory Visit (HOSPITAL_COMMUNITY): Payer: Medicare Other

## 2021-11-11 ENCOUNTER — Inpatient Hospital Stay (HOSPITAL_COMMUNITY): Admission: RE | Admit: 2021-11-11 | Payer: Medicare Other | Source: Ambulatory Visit

## 2021-11-14 NOTE — Progress Notes (Deleted)
Cardiology Office Note    Date:  11/14/2021   ID:  Tracey [name], DOB 1955-07-26, MRN [id number]   PCP:  Fayrene Helper, [place]  Cardiologist:  [name], MD   Advanced Practice Provider:  No care team member to display Electrophysiologist:  None   [phone]   No chief complaint on file.   History of Present Illness:  Tracey [name] is a 66 y.o. female  with a history of CP, DM, HTN, PE HL and morbid obesity. Dobutamine echo in 2014 normal Echo in 2020 LVEF normal   with history of HTN, HLD, DM, PE, morbid obesity, chest pain with normal Dobutamine echo 2014. Echo 2020 normal LVEF.  Patient last saw Dr. Harrington Challenger 12/05/20 and DOE. Was mildly tachycardic in the clinic so 24 hr monitor ordered. This showed NSR with short bursts of SVT longest 11 sec fasted 176 bpm. No changes made.with a history of CP, DM, HTN, PE HL and morbid obesity. Dobutamine echo in 2014 normal Echo in 2020 LVEF normal         Past Medical History:  Diagnosis Date   Arthritis    Diabetes mellitus without complication (HCC)    Diastolic dysfunction 12/2117   Grade 1   GERD (gastroesophageal reflux disease)    Hyperlipidemia    Hyperlipidemia    Hypertension 04/15/2012   Morbid obesity ([place])    PE (pulmonary embolism)    Pulmonary emboli ([place]) 1999   post TAH   Sleep apnea     Past Surgical History:  Procedure Laterality Date   ABDOMINAL HYSTERECTOMY  1999   , totalfor fibroids   CESAREAN SECTION     CHOLECYSTECTOMY  1996   COLONOSCOPY N/A 10/31/2015   Procedure: COLONOSCOPY;  Surgeon: Rogene Houston, MD;  Location: [facility];  Service: Endoscopy;  Laterality: N/A;  930   FOOT SURGERY Right    October 2022    Current Medications: No outpatient medications have been marked as taking for the 11/18/21 encounter (Appointment) with Imogene Burn, PA-C.     Allergies:   Haloperidol lactate and Lipitor [atorvastatin calcium]   Social History    Socioeconomic History   Marital status: Single    Spouse name: Not on file   Number of children: Not on file   Years of education: Not on file   Highest education level: Not on file  Occupational History   Not on file  Tobacco Use   Smoking status: Never   Smokeless tobacco: Never  Vaping Use   Vaping Use: Never used  Substance and Sexual Activity   Alcohol use: No    Alcohol/week: 0.0 standard drinks of alcohol   Drug use: No   Sexual activity: Not Currently  Other Topics Concern   Not on file  Social History Narrative   Not on file   Social Determinants of Health   Financial Resource Strain: Low Risk  (12/04/2020)   Overall Financial Resource Strain (CARDIA)    Difficulty of Paying Living Expenses: Not hard at all  Food Insecurity: No Food Insecurity (12/04/2020)   Hunger Vital Sign    Worried About Running Out of Food in the Last Year: Never true    [place] in the Last Year: Never true  Transportation Needs: No Transportation Needs (12/04/2020)   PRAPARE - Hydrologist (Medical): No    Lack of Transportation (Non-Medical): No  Physical  Activity: Inactive (12/04/2020)   Exercise Vital Sign    Days of Exercise per Week: 0 days    Minutes of Exercise per Session: 0 min  Stress: No Stress Concern Present (12/04/2020)   [place]    Feeling of Stress : Not at all  Social Connections: Moderately Isolated (12/04/2020)   Social Connection and Isolation Panel [NHANES]    Frequency of Communication with Friends and Family: More than three times a week    Frequency of Social Gatherings with Friends and Family: More than three times a week    Attends Religious Services: 1 to 4 times per year    Active Member of [employer] or Organizations: No    Attends Archivist Meetings: Never    Marital Status: Divorced     Family History:  The patient's ***family history includes  Alcohol abuse in her father; Aneurysm in her brother; Arthritis in her sister; Cirrhosis in her father; Coronary artery disease in her sister; Depression in her mother and son; Diabetes in her mother; Heart attack (age of onset: 54) in her sister; Heart murmur in her son; Hyperlipidemia in her father; Hypertension in her father, mother, and son; Sleep apnea in her sister.   ROS:   Please see the history of present illness.    ROS All other systems reviewed and are negative.   PHYSICAL EXAM:   VS:  There were no vitals taken for this visit.  Physical Exam  GEN: Well nourished, well developed, in no acute distress  HEENT: normal  Neck: no JVD, carotid bruits, or masses Cardiac:RRR; no murmurs, rubs, or gallops  Respiratory:  clear to auscultation bilaterally, normal work of breathing GI: soft, nontender, nondistended, + BS Ext: without cyanosis, clubbing, or edema, Good distal pulses bilaterally MS: no deformity or atrophy  Skin: warm and dry, no rash Neuro:  Alert and Oriented x 3, Strength and sensation are intact Psych: euthymic mood, full affect  Wt Readings from Last 3 Encounters:  09/15/21 (!) 368 lb (166.9 kg)  09/10/21 (!) 364 lb (165.1 kg)  06/24/21 (!) 374 lb (169.6 kg)      Studies/Labs Reviewed:   EKG:  EKG is*** ordered today.  The ekg ordered today demonstrates ***  Recent Labs: 12/05/2020: Magnesium 1.8 06/24/2021: ALT 21; BUN 8; Creatinine, Ser 0.72; Hemoglobin 13.6; Platelets 343; Potassium 4.2; Sodium 140; TSH 0.955   Lipid Panel    Component Value Date/Time   CHOL 153 06/24/2021 1616   TRIG 146 06/24/2021 1616   HDL 41 06/24/2021 1616   CHOLHDL 3.7 06/24/2021 1616   CHOLHDL 3.5 02/01/2020 1415   VLDL 23 07/13/2016 1430   LDLCALC 86 06/24/2021 1616   [place] 75 02/01/2020 1415    Additional studies/ records that were reviewed today include:  with a history of CP, DM, HTN, PE HL and morbid obesity. Dobutamine echo in 2014 normal Echo in 2020 LVEF normal      Patch Wear Time:  2 days and 22 hours (2022-08-25T10:29:56-0400 to 2022-08-28T08:50:37-0400)   Predominant rhythm:  Sinus rhythm  Rates 50 to 150 bpm   Average HR 89 bpm   Short bursts SVT   Longest 11 sec with fastest rate 176 bpm    Rare PVC, PAC       Risk Assessment/Calculations:   {Does this patient have ATRIAL FIBRILLATION?:[id number]}     ASSESSMENT:    No diagnosis found.   PLAN:  In order of problems listed  above:   HTN  HLD  SVT  History of PE  Morbid obesity  Shared Decision Making/Informed Consent   {Are you ordering a CV Procedure (e.g. stress test, cath, DCCV, TEE, etc)?   Press F2        :[id number]}    Medication Adjustments/Labs and Tests Ordered: Current medicines are reviewed at length with the patient today.  Concerns regarding medicines are outlined above.  Medication changes, Labs and Tests ordered today are listed in the Patient Instructions below. There are no Patient Instructions on file for this visit.   Sumner Boast, PA-C  11/14/2021 11:05 AM    [facility] [address], [place], [place]  [address] Phone: [phone]; Fax: [phone]

## 2021-11-18 ENCOUNTER — Ambulatory Visit: Payer: Medicare Other | Admitting: Physician Assistant

## 2021-11-18 DIAGNOSIS — I10 Essential (primary) hypertension: Secondary | ICD-10-CM

## 2021-11-18 DIAGNOSIS — E785 Hyperlipidemia, unspecified: Secondary | ICD-10-CM

## 2021-11-18 DIAGNOSIS — R000 Tachycardia, unspecified: Secondary | ICD-10-CM

## 2021-11-18 DIAGNOSIS — Z86711 Personal history of pulmonary embolism: Secondary | ICD-10-CM

## 2021-11-19 ENCOUNTER — Ambulatory Visit: Payer: Medicare Other | Admitting: Podiatry

## 2021-12-08 ENCOUNTER — Ambulatory Visit: Payer: Medicare Other | Admitting: Medical

## 2021-12-09 ENCOUNTER — Ambulatory Visit: Payer: Medicare Other | Admitting: Physician Assistant

## 2021-12-12 ENCOUNTER — Other Ambulatory Visit: Payer: Self-pay | Admitting: Nurse Practitioner

## 2021-12-12 DIAGNOSIS — E1165 Type 2 diabetes mellitus with hyperglycemia: Secondary | ICD-10-CM

## 2021-12-18 ENCOUNTER — Ambulatory Visit: Payer: Medicare Other | Admitting: Nurse Practitioner

## 2021-12-24 ENCOUNTER — Ambulatory Visit (INDEPENDENT_AMBULATORY_CARE_PROVIDER_SITE_OTHER): Payer: Medicare Other | Admitting: Podiatry

## 2021-12-24 ENCOUNTER — Encounter: Payer: Self-pay | Admitting: Podiatry

## 2021-12-24 DIAGNOSIS — B351 Tinea unguium: Secondary | ICD-10-CM

## 2021-12-24 DIAGNOSIS — L03039 Cellulitis of unspecified toe: Secondary | ICD-10-CM | POA: Diagnosis not present

## 2021-12-24 DIAGNOSIS — M79674 Pain in right toe(s): Secondary | ICD-10-CM

## 2021-12-24 DIAGNOSIS — E1142 Type 2 diabetes mellitus with diabetic polyneuropathy: Secondary | ICD-10-CM | POA: Diagnosis not present

## 2021-12-24 DIAGNOSIS — M79675 Pain in left toe(s): Secondary | ICD-10-CM

## 2021-12-24 MED ORDER — MUPIROCIN 2 % EX OINT: TOPICAL_OINTMENT | CUTANEOUS | 1 refills | Status: DC

## 2021-12-24 NOTE — Patient Instructions (Signed)
EPSOM SALT FOOT SOAK INSTRUCTIONS  *IF YOU HAVE BEEN PRESCRIBED ANTIBIOTICS, TAKE AS INSTRUCTED UNTIL ALL ARE GONE*  Shopping List:  A. Plain epsom salt (not scented) B. Mupirocin Ointment C. 1-inch fabric band-aids   Place 1/4 cup of epsom salts in 2 quarts of warm tap water. IF YOU ARE DIABETIC, OR HAVE NEUROPATHY, CHECK THE TEMPERATURE OF THE WATER WITH YOUR ELBOW.   Submerge your foot/feet in the solution and soak for 10-15 minutes.      3.  Next, remove your foot/feet from solution, blot dry the affected area.    4.  Apply light amount of antibiotic cream/ointment and cover with fabric band-aid .  5.  This soak should be done once a day for 10 days.   6.  Monitor for any signs/symptoms of infection such as redness, swelling, odor, drainage, increased pain, or non-healing of digit.   7.  Please do not hesitate to call the office and speak to a Nurse or Doctor if you have questions.   8.  If you experience fever, chills, nightsweats, nausea or vomiting with worsening of digit/foot, please go to the emergency room.

## 2021-12-28 NOTE — Progress Notes (Signed)
  Subjective:  Patient ID: Tracey [name], female    DOB: 16-Jul-1955,  MRN: [id number]  OPIE FANTON presents to clinic today for at risk foot care with history of diabetic neuropathy and painful thick toenails that are difficult to trim. Pain interferes with ambulation. Aggravating factors include wearing enclosed shoe gear. Pain is relieved with periodic professional debridement.  Last known  HgA1c was around 7%. Patient did not check blood glucose this morning.  New problem(s):  ingrown toenail to the right great toe which patient states has been present for the past few weeks. States she has not attempted any treatment. She did not call for an earlier appointment stating transportation is difficult for her. She denies any redness, swelling or drainage. Denies any fever, chills, nightsweats, nausea or vomiting.  PCP is Fayrene Helper, MD , and last visit was  Sep 10, 2021.  Allergies  Allergen Reactions   Haloperidol Lactate Anaphylaxis   Lipitor [Atorvastatin Calcium] Other (See Comments)    Markedly elevated liver enzymes   Review of Systems: Negative except as noted in the HPI.  Objective:  Tracey [name] is a pleasant 66 y.o. female in NAD. AAO x 3.  Vascular Capillary refill time to digits immediate b/l. Palpable pedal pulses b/l LE. Pedal hair absent. No pain with calf compression b/l. Lower extremity skin temperature gradient within normal limits. Nonpitting edema noted right >left. No cyanosis or clubbing noted.  Neurologic Normal speech. Oriented to person, place, and time. Protective sensation decreased with 10 gram monofilament b/l.  Dermatologic Skin warm and supple b/l lower extremities. No open wounds b/l LE. No interdigital macerations noted b/l LE. Toenails 1-5 b/l elongated, discolored, dystrophic, thickened, crumbly with subungual debris and tenderness to dorsal palpation. Longitudinal scar noted lateral aspect of right foot completely epithelialized.    Incurvated nailplate right great toe medial border(s) with tenderness to palpation. There is underlying paronychia with granulation tissue which bleeds easily. No purulence noted and no penetration into deep tissue.  Orthopedic: Normal muscle strength 5/5 to all lower extremity muscle groups bilaterally. No gross bony deformities b/l lower extremities.   Radiographs: None Assessment/Plan: 1. Pain due to onychomycosis of toenails of both feet   2. Paronychia of great toe   3. Diabetic peripheral neuropathy associated with type 2 diabetes mellitus ([place])   -Examined patient. -Toenails 1-5 left foot and 2-5 right foot debrided in length and girth without iatrogenic bleeding with sterile nail nipper and dremel.  -Offending nail border debrided and curretaged medial border right hallux utilizing sterile nail nipper and currette. Border(s) cleansed with alcohol and triple antibiotic ointment applied. Dispensed written instructions for once daily epsom salt soaks for 10 days. Call office if there are any concerns. -Prescription written for Mupirocin Ointment. Patient is to apply to right great toe once daily and cover with dressing.  -Patient scheduled to see Dr. Lanae Crumbly in two weeks for follow up of paronychia right great toe. -Patient/POA to call should there be question/concern in the interim.  Return in about 2 weeks (around 01/07/2022).  Marzetta Board, DPM

## 2021-12-31 ENCOUNTER — Ambulatory Visit (INDEPENDENT_AMBULATORY_CARE_PROVIDER_SITE_OTHER): Payer: Medicare Other | Admitting: Nurse Practitioner

## 2021-12-31 ENCOUNTER — Encounter: Payer: Self-pay | Admitting: Nurse Practitioner

## 2021-12-31 VITALS — BP 116/76 | HR 106 | Ht 70.0 in | Wt 367.4 lb

## 2021-12-31 DIAGNOSIS — I10 Essential (primary) hypertension: Secondary | ICD-10-CM | POA: Diagnosis not present

## 2021-12-31 DIAGNOSIS — Z794 Long term (current) use of insulin: Secondary | ICD-10-CM | POA: Diagnosis not present

## 2021-12-31 DIAGNOSIS — E119 Type 2 diabetes mellitus without complications: Secondary | ICD-10-CM | POA: Diagnosis not present

## 2021-12-31 DIAGNOSIS — E1165 Type 2 diabetes mellitus with hyperglycemia: Secondary | ICD-10-CM

## 2021-12-31 DIAGNOSIS — E559 Vitamin D deficiency, unspecified: Secondary | ICD-10-CM | POA: Diagnosis not present

## 2021-12-31 DIAGNOSIS — E782 Mixed hyperlipidemia: Secondary | ICD-10-CM | POA: Diagnosis not present

## 2021-12-31 LAB — POCT GLYCOSYLATED HEMOGLOBIN (HGB A1C): Hemoglobin A1C: 6.9 % — AB (ref 4.0–5.6)

## 2021-12-31 MED ORDER — HUMULIN R U-500 KWIKPEN 500 UNIT/ML ~~LOC~~ SOPN: 25.0000 [IU] | PEN_INJECTOR | Freq: Three times a day (TID) | SUBCUTANEOUS | 3 refills | Status: DC

## 2021-12-31 MED ORDER — SEMAGLUTIDE (1 MG/DOSE) 4 MG/3ML ~~LOC~~ SOPN: 1.0000 mg | PEN_INJECTOR | SUBCUTANEOUS | 3 refills | Status: DC

## 2021-12-31 NOTE — Progress Notes (Signed)
12/31/2021, 9:12 AM   Endocrinology Follow Up Visit    Subjective:    Patient ID: Tracey [name], female    DOB: 12-Feb-1956.  Tracey [name] is being engaged in follow-up for management of currently uncontrolled symptomatic type 2 diabetes, hyperlipidemia, hypertension.    PMD:   Fayrene Helper, MD.  Past Medical History:  Diagnosis Date   Arthritis    Diabetes mellitus without complication ([place])    Diastolic dysfunction 04/6604   Grade 1   GERD (gastroesophageal reflux disease)    Hyperlipidemia    Hyperlipidemia    Hypertension 04/15/2012   Morbid obesity ([place])    PE (pulmonary embolism)    Pulmonary emboli ([place]) 1999   post TAH   Sleep apnea    Past Surgical History:  Procedure Laterality Date   ABDOMINAL HYSTERECTOMY  1999   , totalfor fibroids   CESAREAN SECTION     CHOLECYSTECTOMY  1996   COLONOSCOPY N/A 10/31/2015   Procedure: COLONOSCOPY;  Surgeon: Rogene Houston, MD;  Location: [facility];  Service: Endoscopy;  Laterality: N/A;  930   FOOT SURGERY Right    October 2022   Social History   Socioeconomic History   Marital status: Single    Spouse name: Not on file   Number of children: Not on file   Years of education: Not on file   Highest education level: Not on file  Occupational History   Not on file  Tobacco Use   Smoking status: Never   Smokeless tobacco: Never  Vaping Use   Vaping Use: Never used  Substance and Sexual Activity   Alcohol use: No    Alcohol/week: 0.0 standard drinks of alcohol   Drug use: No   Sexual activity: Not Currently  Other Topics Concern   Not on file  Social History Narrative   Not on file   Social Determinants of Health   Financial Resource Strain: Low Risk  (12/04/2020)   Overall Financial Resource Strain (CARDIA)    Difficulty of Paying Living Expenses: Not hard at all  Food  Insecurity: No Food Insecurity (12/04/2020)   Hunger Vital Sign    Worried About Running Out of Food in the Last Year: Never true    [place] in the Last Year: Never true  Transportation Needs: No Transportation Needs (12/04/2020)   PRAPARE - Hydrologist (Medical): No    Lack of Transportation (Non-Medical): No  Physical Activity: Inactive (12/04/2020)   Exercise Vital Sign    Days of Exercise per Week: 0 days    Minutes of Exercise per Session: 0 min  Stress: No Stress Concern Present (12/04/2020)   [place]    Feeling of Stress : Not at all  Social Connections: Moderately Isolated (12/04/2020)   Social Connection and Isolation Panel [NHANES]    Frequency of Communication with Friends  and Family: More than three times a week    Frequency of Social Gatherings with Friends and Family: More than three times a week    Attends Religious Services: 1 to 4 times per year    Active Member of [employer] or Organizations: No    Attends Archivist Meetings: Never    Marital Status: Divorced   Outpatient Encounter Medications as of 12/31/2021  Medication Sig   acetaminophen (TYLENOL) 500 MG tablet Take 500 mg by mouth every 6 (six) hours as needed for moderate pain.   aspirin EC 81 MG tablet Take 1 tablet (81 mg total) by mouth daily.   blood glucose meter kit and supplies KIT Dispense based on patient and insurance preference. Use up to four times daily as directed.   Cholecalciferol (VITAMIN D-3) 125 MCG (5000 UT) TABS Take 5,000 Units by mouth daily.   glucose blood (ACCU-CHEK GUIDE) test strip USE 1 STRIP TO CHECK GLUCOSE 3 TIMES DAILY AS DIRECTED   Lancets MISC Use to test BG qid. Dx : E11.65. Softclix Lancets   metFORMIN (GLUCOPHAGE) 1000 MG tablet TAKE ONE TABLET BY MOUTH TWICE A DAY WITH A MEAL   metoprolol tartrate (LOPRESSOR) 50 MG tablet TAKE ONE TABLET (50MG TOTAL) BY MOUTH TWO  TIMES DAILY   mupirocin ointment (BACTROBAN) 2 % Apply to affected area once daily.   olmesartan (BENICAR) 20 MG tablet Take 1 tablet (20 mg total) by mouth daily.   omeprazole (PRILOSEC) 20 MG capsule TAKE ONE CAPSULE BY MOUTH DAILY   rosuvastatin (CRESTOR) 20 MG tablet TAKE ONE TABLET (20MG TOTAL) BY MOUTH DAILY   Semaglutide, 1 MG/DOSE, 4 MG/3ML SOPN Inject 1 mg as directed once a week.   spironolactone (ALDACTONE) 25 MG tablet TAKE ONE (1) TABLET BY MOUTH EVERY DAY   UNIFINE PENTIPS 31G X 8 MM MISC USE FOR INSULIN INJECTIONS THREE TIMES DAILY.   [DISCONTINUED] insulin regular human CONCENTRATED (HUMULIN R U-500 KWIKPEN) 500 UNIT/ML KwikPen INJECT 35 UNITS SUBCUTANEOUSLY INTO THE SKIN 3 TIMES DAILY WITH MEALS.   [DISCONTINUED] Semaglutide,0.25 or 0.5MG/DOS, (OZEMPIC, 0.25 OR 0.5 MG/DOSE,) 2 MG/1.5ML SOPN Inject 0.5 mg into the skin once a week.   insulin regular human CONCENTRATED (HUMULIN R U-500 KWIKPEN) 500 UNIT/ML KwikPen Inject 25 Units into the skin 3 (three) times daily with meals. INJECT 35 UNITS SUBCUTANEOUSLY INTO THE SKIN 3 TIMES DAILY WITH MEALS.   No facility-administered encounter medications on file as of 12/31/2021.    ALLERGIES: Allergies  Allergen Reactions   Haloperidol Lactate Anaphylaxis   Lipitor [Atorvastatin Calcium] Other (See Comments)    Markedly elevated liver enzymes    VACCINATION STATUS: Immunization History  Administered Date(s) Administered   Influenza,inj,Quad PF,6+ Mos 04/01/2015, 03/16/2016, 01/20/2017, 04/11/2018, 02/13/2019, 01/31/2020, 12/05/2020   Moderna Sars-Covid-2 Vaccination 12/13/2019, 01/11/2020   PNEUMOCOCCAL CONJUGATE-20 12/05/2020   Pneumococcal Polysaccharide-23 05/13/2015    Diabetes She presents for her follow-up diabetic visit. She has type 2 diabetes mellitus. Onset time: He was diagnosed at approximate age of 66 years. Her disease course has been improving. There are no hypoglycemic associated symptoms. Pertinent negatives for  hypoglycemia include no confusion, headaches, pallor or seizures. Associated symptoms include fatigue. Pertinent negatives for diabetes include no blurred vision, no chest pain, no polydipsia, no polyphagia and no polyuria. There are no hypoglycemic complications. Symptoms are stable. Diabetic complications include heart disease and nephropathy. Risk factors for coronary artery disease include diabetes mellitus, dyslipidemia, family history, hypertension, obesity, post-menopausal and sedentary lifestyle. Current diabetic treatment includes  intensive insulin program and oral agent (dual therapy) (and Ozempic). She is compliant with treatment most of the time. Her weight is fluctuating minimally. She is following a generally healthy diet. When asked about meal planning, she reported none. She has had a previous visit with a dietitian. She never participates in exercise. Her home blood glucose trend is decreasing steadily. Her overall blood glucose range is 110-130 mg/dl. (She presents today with her CGM showing at goal glycemic profile overall.  Her POCT A1c today is 6.9%, increasing slightly from last visit of 6.4%.  Analysis of her CGM shows TIR 99%, TAR 0%, TBR 1% with active time of 46%.  She denies any significant hypoglycemia and reports she has tolerated the Ozempic well.) An ACE inhibitor/angiotensin II receptor blocker is not being taken. She does not see a podiatrist.Eye exam is not current.  Hyperlipidemia This is a chronic problem. The current episode started more than 1 year ago. The problem is uncontrolled. Recent lipid tests were reviewed and are high. Exacerbating diseases include chronic renal disease, diabetes and obesity. Factors aggravating her hyperlipidemia include fatty foods. Pertinent negatives include no chest pain, myalgias or shortness of breath. Current antihyperlipidemic treatment includes statins. The current treatment provides moderate improvement of lipids. Compliance problems  include adherence to diet and adherence to exercise.  Risk factors for coronary artery disease include diabetes mellitus, dyslipidemia, hypertension, obesity, a sedentary lifestyle and post-menopausal.  Hypertension This is a chronic problem. The current episode started more than 1 year ago. The problem is unchanged. The problem is uncontrolled. Pertinent negatives include no blurred vision, chest pain, headaches, palpitations or shortness of breath. There are no associated agents to hypertension. Risk factors for coronary artery disease include dyslipidemia, diabetes mellitus, obesity, sedentary lifestyle and post-menopausal state. Past treatments include beta blockers and diuretics. The current treatment provides moderate improvement. Compliance problems include exercise and diet.  Hypertensive end-organ damage includes kidney disease. Identifiable causes of hypertension include chronic renal disease.   Review of systems  Constitutional: + Minimally fluctuating body weight,  current Body mass index is 52.72 kg/m. , no fatigue, no subjective hyperthermia, no subjective hypothermia Eyes: no blurry vision, no xerophthalmia ENT: no sore throat, no nodules palpated in throat, no dysphagia/odynophagia, no hoarseness Cardiovascular: no chest pain, no shortness of breath, no palpitations, no leg swelling Respiratory: no cough, no shortness of breath Gastrointestinal: no nausea/vomiting/diarrhea Musculoskeletal: no muscle/joint aches Skin: no rashes, no hyperemia Neurological: no tremors, no numbness, no tingling, no dizziness Psychiatric: no depression, no anxiety    Objective:    BP 116/76 (BP Location: Left Arm, Patient Position: Sitting, Cuff Size: Large)   Pulse (!) 106   Ht 5' 10"  (1.778 m)   Wt (!) 367 lb 6.4 oz (166.7 kg)   BMI 52.72 kg/m   Wt Readings from Last 3 Encounters:  12/31/21 (!) 367 lb 6.4 oz (166.7 kg)  09/15/21 (!) 368 lb (166.9 kg)  09/10/21 (!) 364 lb (165.1 kg)      BP Readings from Last 3 Encounters:  12/31/21 116/76  09/15/21 (!) 148/85  09/10/21 135/73     Physical Exam- Limited  Constitutional:  Body mass index is 52.72 kg/m. , not in acute distress, normal state of mind Eyes:  EOMI, no exophthalmos Neck: Supple Cardiovascular: RRR, no murmurs, rubs, or gallops, no edema Respiratory: Adequate breathing efforts, no crackles, rales, rhonchi, or wheezing Musculoskeletal: no gross deformities, strength intact in all four extremities, no gross restriction of joint movements Skin:  no  rashes, no hyperemia Neurological: no tremor with outstretched hands    CMP     Component Value Date/Time   NA 140 06/24/2021 1616   K 4.2 06/24/2021 1616   CL 100 06/24/2021 1616   CO2 23 06/24/2021 1616   GLUCOSE 138 (H) 06/24/2021 1616   GLUCOSE 148 (H) 12/21/2019 1252   BUN 8 06/24/2021 1616   CREATININE 0.72 06/24/2021 1616   CREATININE 0.71 12/21/2019 1252   CALCIUM 9.8 06/24/2021 1616   PROT 7.9 06/24/2021 1616   ALBUMIN 4.6 06/24/2021 1616   AST 23 06/24/2021 1616   ALT 21 06/24/2021 1616   ALKPHOS 65 06/24/2021 1616   BILITOT 0.5 06/24/2021 1616   GFRNONAA 77 02/13/2019 1207   GFRAA 90 02/13/2019 1207    Diabetic Labs (most recent): Lab Results  Component Value Date   HGBA1C 6.9 (A) 12/31/2021   HGBA1C 6.4 09/15/2021   HGBA1C 6.5 (A) 05/22/2021   MICROALBUR 30 05/22/2021   MICROALBUR 7.9 12/21/2019   MICROALBUR 63.8 (H) 04/12/2018    Lipid Panel     Component Value Date/Time   CHOL 153 06/24/2021 1616   TRIG 146 06/24/2021 1616   HDL 41 06/24/2021 1616   CHOLHDL 3.7 06/24/2021 1616   CHOLHDL 3.5 02/01/2020 1415   VLDL 23 07/13/2016 1430   LDLCALC 86 06/24/2021 1616   LDLCALC 75 02/01/2020 1415      Lab Results  Component Value Date   TSH 0.955 06/24/2021   TSH 0.727 12/05/2020   TSH 0.69 02/13/2019   TSH 1.23 05/09/2018   TSH 0.95 07/13/2016   TSH 1.26 07/10/2015   TSH 1.382 11/17/2013   TSH 0.519 04/14/2012    TSH 1.525 11/07/2009      Lipid Panel     Component Value Date/Time   CHOL 153 06/24/2021 1616   TRIG 146 06/24/2021 1616   HDL 41 06/24/2021 1616   CHOLHDL 3.7 06/24/2021 1616   CHOLHDL 3.5 02/01/2020 1415   VLDL 23 07/13/2016 1430   LDLCALC 86 06/24/2021 1616   LDLCALC 75 02/01/2020 1415     Assessment & Plan:   1) Type 2 Diabetes without complication, with long-term current use of insulin  - Inell D Nordhoff has currently uncontrolled symptomatic type 2 DM since 66 years of age.  She presents today with her CGM showing at goal glycemic profile overall.  Her POCT A1c today is 6.9%, increasing slightly from last visit of 6.4%.  Analysis of her CGM shows TIR 99%, TAR 0%, TBR 1% with active time of 46%.  She denies any significant hypoglycemia and reports she has tolerated the Ozempic well.  -her diabetes is complicated by obesity/sedentary life and JAELAH HAUTH remains at a high risk for more acute and chronic complications which include CAD, CVA, CKD, retinopathy, and neuropathy. These are all discussed in detail with the patient.  - Nutritional counseling repeated at each appointment due to patients tendency to fall back in to old habits.  - The patient admits there is a room for improvement in their diet and drink choices. -  Suggestion is made for the patient to avoid simple carbohydrates from their diet including Cakes, Sweet Desserts / Pastries, Ice Cream, Soda (diet and regular), Sweet Tea, Candies, Chips, Cookies, Sweet Pastries, Store Bought Juices, Alcohol in Excess of 1-2 drinks a day, Artificial Sweeteners, Coffee Creamer, and "Sugar-free" Products. This will help patient to have stable blood glucose profile and potentially avoid unintended weight gain.   - I encouraged  the patient to switch to unprocessed or minimally processed complex starch and increased protein intake (animal or plant source), fruits, and vegetables.   - Patient is advised to stick to a  routine mealtimes to eat 3 meals a day and avoid unnecessary snacks (to snack only to correct hypoglycemia).  - I have approached her with the following individualized plan to manage diabetes and patient agrees:   -She is advised to lower her U500 to 25 units TID with meals if glucose is above 90 and she is eating and increase her Ozempic to 1 mg SQ weekly.  She can continue her Metformin 1000 mg po twice daily with meals for now.   -Encouraged patient to monitor blood glucose 4 times per day (using her CGM), before meals and at bedtime and notify the clinic if blood glucose levels are less than 70 or greater than 300 for 3 tests in a row.  She has benefited greatly from CGM device/.  -She is also advised not to inject insulin without proper glucose monitoring.  2) BP/HTN:  Her blood pressure is controlled to target.  She typically waits to take her Spironolactone 25 mg until she gets home so she won't have to urinate as often.  She is advised to continue Metoprolol 50 mg po twice daily, Olmesartan 20 mg po daily, and continue Spironolactone 25 mg po daily.    3) Lipids/HPL:  Her recent lipid panel from 06/24/21 shows controlled LDL of 86.  She is advised to continue Crestor 20 mg po daily at bedtime.  Side effects and precautions discussed with her.    4) Vitamin D Deficiency: Her recent vitamin D level was 86.  She has completed replenishment with ergocalciferol and is now taking OTC Vitamin D3 5000 units daily as maintenance dose.  She is advised to continue for now.    5) Chronic Care/Health Maintenance: -she is on Statin medications and is encouraged to continue to follow up with Ophthalmology, Dentist, Podiatrist at least yearly or according to recommendations, and advised to stay away from smoking. I have recommended yearly flu vaccine and pneumonia vaccination at least every 5 years; moderate intensity exercise for up to 150 minutes weekly; and  sleep for at least 7 hours a day.  - I  advised patient to maintain close follow up with Fayrene Helper, MD for primary care needs.       I spent 30 minutes in the care of the patient today including review of labs from [facility], Lipids, Thyroid Function, Hematology (current and previous including abstractions from other facilities); face-to-face time discussing  her blood glucose readings/logs, discussing hypoglycemia and hyperglycemia episodes and symptoms, medications doses, her options of short and long term treatment based on the latest standards of care / guidelines;  discussion about incorporating lifestyle medicine;  and documenting the encounter. Risk reduction counseling performed per USPSTF guidelines to reduce obesity and cardiovascular risk factors.     Please refer to Patient Instructions for Blood Glucose Monitoring and Insulin/Medications Dosing Guide"  in media tab for additional information. Please  also refer to " Patient Self Inventory" in the Media  tab for reviewed elements of pertinent patient history.  Duane Boston participated in the discussions, expressed understanding, and voiced agreement with the above plans.  All questions were answered to her satisfaction. she is encouraged to contact clinic should she have any questions or concerns prior to her return visit.   Follow up plan: - Return in about 4 months (  around 05/02/2022) for Diabetes F/U with A1c in office, No previsit labs, Bring meter and logs.   Rayetta Pigg, [facility] [address] [place], [place] [address] Phone: [phone] Fax: [phone]  12/31/2021, 9:12 AM

## 2022-01-07 ENCOUNTER — Telehealth: Payer: Self-pay

## 2022-01-07 ENCOUNTER — Encounter: Payer: Self-pay | Admitting: Nurse Practitioner

## 2022-01-07 ENCOUNTER — Ambulatory Visit (INDEPENDENT_AMBULATORY_CARE_PROVIDER_SITE_OTHER): Payer: Medicare Other | Admitting: Nurse Practitioner

## 2022-01-07 DIAGNOSIS — Z0000 Encounter for general adult medical examination without abnormal findings: Secondary | ICD-10-CM | POA: Diagnosis not present

## 2022-01-07 NOTE — Progress Notes (Signed)
I connected with  Duane Boston on 01/07/22 by a audio enabled telemedicine application and verified that I am speaking with the correct person using two identifiers.  Patient Location: Home  Provider Location: Home Office  I discussed the limitations of evaluation and management by telemedicine. The patient expressed understanding and agreed to proceed.  Subjective:   Tracey [name] is a 66 y.o. female who presents for Medicare Annual (Subsequent) preventive examination.  Review of Systems           Objective:    There were no vitals filed for this visit. There is no height or weight on file to calculate BMI.     12/04/2020    2:14 PM 11/22/2017    2:32 PM 11/16/2016    2:28 PM 10/22/2016    1:30 PM 05/12/2016   11:09 AM 02/10/2016   11:42 AM 10/31/2015    8:39 AM  Advanced Directives  Does Patient Have a Medical Advance Directive? No No No No No No No  Would patient like information on creating a medical advance directive?  Yes (ED - Information included in AVS) Yes (MAU/Ambulatory/Procedural Areas - Information given)    No - patient declined information    Current Medications (verified) Outpatient Encounter Medications as of 01/07/2022  Medication Sig   acetaminophen (TYLENOL) 500 MG tablet Take 500 mg by mouth every 6 (six) hours as needed for moderate pain.   aspirin EC 81 MG tablet Take 1 tablet (81 mg total) by mouth daily.   blood glucose meter kit and supplies KIT Dispense based on patient and insurance preference. Use up to four times daily as directed.   Cholecalciferol (VITAMIN D-3) 125 MCG (5000 UT) TABS Take 5,000 Units by mouth daily.   glucose blood (ACCU-CHEK GUIDE) test strip USE 1 STRIP TO CHECK GLUCOSE 3 TIMES DAILY AS DIRECTED   insulin regular human CONCENTRATED (HUMULIN R U-500 KWIKPEN) 500 UNIT/ML KwikPen Inject 25 Units into the skin 3 (three) times daily with meals. INJECT 35 UNITS SUBCUTANEOUSLY INTO THE SKIN 3 TIMES DAILY WITH MEALS.    Lancets MISC Use to test BG qid. Dx : E11.65. Softclix Lancets   metFORMIN (GLUCOPHAGE) 1000 MG tablet TAKE ONE TABLET BY MOUTH TWICE A DAY WITH A MEAL   metoprolol tartrate (LOPRESSOR) 50 MG tablet TAKE ONE TABLET (50MG TOTAL) BY MOUTH TWO TIMES DAILY   mupirocin ointment (BACTROBAN) 2 % Apply to affected area once daily.   olmesartan (BENICAR) 20 MG tablet Take 1 tablet (20 mg total) by mouth daily.   omeprazole (PRILOSEC) 20 MG capsule TAKE ONE CAPSULE BY MOUTH DAILY   rosuvastatin (CRESTOR) 20 MG tablet TAKE ONE TABLET (20MG TOTAL) BY MOUTH DAILY   Semaglutide, 1 MG/DOSE, 4 MG/3ML SOPN Inject 1 mg as directed once a week.   spironolactone (ALDACTONE) 25 MG tablet TAKE ONE (1) TABLET BY MOUTH EVERY DAY   UNIFINE PENTIPS 31G X 8 MM MISC USE FOR INSULIN INJECTIONS THREE TIMES DAILY.   No facility-administered encounter medications on file as of 01/07/2022.    Allergies (verified) Haloperidol lactate and Lipitor [atorvastatin calcium]   History: Past Medical History:  Diagnosis Date   Arthritis    Diabetes mellitus without complication (HCC)    Diastolic dysfunction 10/7822   Grade 1   GERD (gastroesophageal reflux disease)    Hyperlipidemia    Hyperlipidemia    Hypertension 04/15/2012   Morbid obesity ([place])    PE (pulmonary embolism)    Pulmonary emboli ([place]) 1999  post TAH   Sleep apnea    Past Surgical History:  Procedure Laterality Date   ABDOMINAL HYSTERECTOMY  1999   , totalfor fibroids   CESAREAN SECTION     CHOLECYSTECTOMY  1996   COLONOSCOPY N/A 10/31/2015   Procedure: COLONOSCOPY;  Surgeon: Rogene [place], MD;  Location: [facility];  Service: Endoscopy;  Laterality: N/A;  72   FOOT SURGERY Right    October 2022   Family History  Problem Relation Age of Onset   Diabetes Mother    Hypertension Mother    Depression Mother    Hyperlipidemia Father    Hypertension Father    Alcohol abuse Father    Cirrhosis Father    Aneurysm Brother    Sleep apnea Sister     Arthritis Sister    Coronary artery disease Sister    Heart attack Sister 67   Heart murmur Son    Hypertension Son    Depression Son    Social History   Socioeconomic History   Marital status: Single    Spouse name: Not on file   Number of children: Not on file   Years of education: Not on file   Highest education level: Not on file  Occupational History   Not on file  Tobacco Use   Smoking status: Never   Smokeless tobacco: Never  Vaping Use   Vaping Use: Never used  Substance and Sexual Activity   Alcohol use: No    Alcohol/week: 0.0 standard drinks of alcohol   Drug use: No   Sexual activity: Not Currently  Other Topics Concern   Not on file  Social History Narrative   Not on file   Social Determinants of Health   Financial Resource Strain: Low Risk  (12/04/2020)   Overall Financial Resource Strain (CARDIA)    Difficulty of Paying Living Expenses: Not hard at all  Food Insecurity: No Food Insecurity (12/04/2020)   Hunger Vital Sign    Worried About Running Out of Food in the Last Year: Never true    [place] in the Last Year: Never true  Transportation Needs: No Transportation Needs (12/04/2020)   PRAPARE - Hydrologist (Medical): No    Lack of Transportation (Non-Medical): No  Physical Activity: Inactive (12/04/2020)   Exercise Vital Sign    Days of Exercise per Week: 0 days    Minutes of Exercise per Session: 0 min  Stress: No Stress Concern Present (12/04/2020)   [place]    Feeling of Stress : Not at all  Social Connections: Moderately Isolated (12/04/2020)   Social Connection and Isolation Panel [NHANES]    Frequency of Communication with Friends and Family: More than three times a week    Frequency of Social Gatherings with Friends and Family: More than three times a week    Attends Religious Services: 1 to 4 times per year    Active Member of [employer]  or Organizations: No    Attends Archivist Meetings: Never    Marital Status: Divorced    Tobacco Counseling Counseling given: Not Answered   Clinical Intake:                 Diabetic?Nutrition Risk Assessment:  Has the patient had any N/V/D within the last 2 months?  Yes  Does the patient have any non-healing wounds?  No  Has the patient had any unintentional weight  loss or weight gain?  No   Diabetes:  Is the patient diabetic?  Yes  If diabetic, was a CBG obtained today?  no Did the patient bring in their glucometer from home?  NA How often do you monitor your CBG's? CGM  Financial Strains and Diabetes Management:  Are you having any financial strains with the device, your supplies or your medication? No .  Does the patient want to be seen by Chronic Care Management for management of their diabetes?  No  Would the patient like to be referred to a Nutritionist or for Diabetic Management?  No   Diabetic Exams:  Diabetic Eye Exam: Overdue for diabetic eye exam. Pt has been advised about the importance in completing this exam. Patient advised to call and schedule an eye exam. Referral is pending  Foot exam completed          Activities of Daily Living     No data to display          Patient Care Team: Fayrene Helper, MD as PCP - General (Family Medicine) Fay Records, MD as PCP - Cardiology (Cardiology) Fay Records, MD as Consulting Physician (Cardiology) Phillips Odor, MD as Consulting Physician (Neurology) Rutherford Guys, MD as Consulting Physician (Ophthalmology)  Indicate any recent Medical Services you may have received from other than [facility] providers in the past year (date may be approximate).     Assessment:   This is a routine wellness examination for Tracey [name].  Hearing/Vision screen No results found.  Dietary issues and exercise activities discussed:     Goals Addressed   None    Depression Screen    02/07/2021     3:14 PM 12/04/2020    2:11 PM 11/30/2019    9:16 AM 11/29/2018    3:12 PM 04/11/2018    3:44 PM 03/21/2018   12:07 PM 12/14/2017    3:27 PM  PHQ 2/9 Scores  PHQ - 2 Score 0 0 0 0 4 2 0  PHQ- 9 Score     9 11     Fall Risk    09/10/2021    3:51 PM 06/24/2021    2:57 PM 02/07/2021    3:14 PM 12/05/2020    8:36 AM 12/04/2020    2:14 PM  Fall Risk   Falls in the past year? 1 1 1 1 1   Number falls in past yr: 0 1 0 0 0  Injury with Fall? 0 0 0 0 0  Risk for fall due to :     No Fall Risks  Follow up     Falls evaluation completed    FALL RISK PREVENTION PERTAINING TO THE HOME:  Any stairs in or around the home? Yes  If so, are there any without handrails? No Home free of loose throw rugs in walkways, pet beds, electrical cords, etc? Yes  Adequate lighting in your home to reduce risk of falls? Yes   ASSISTIVE DEVICES UTILIZED TO PREVENT FALLS:  Life alert? No  Use of a cane, walker or w/c? Yes  Grab bars in the bathroom? No  Shower chair or bench in shower? No  Elevated toilet seat or a handicapped toilet? No   TIMED UP AND GO:        12/04/2020    2:17 PM 11/30/2019    9:19 AM 11/29/2018    3:16 PM 11/22/2017    2:35 PM 11/16/2016    2:32 PM  6CIT Screen  What Year?  0 points 0 points 0 points 0 points 0 points  What month? 0 points 0 points 0 points 0 points 0 points  What time? 0 points 0 points 0 points 0 points 0 points  Count back from 20 0 points 0 points 0 points  0 points  Months in reverse 0 points 0 points 0 points  0 points  Repeat phrase 0 points 0 points 0 points  0 points  Total Score 0 points 0 points 0 points  0 points    Immunizations Immunization History  Administered Date(s) Administered   Influenza,inj,Quad PF,6+ Mos 04/01/2015, 03/16/2016, 01/20/2017, 04/11/2018, 02/13/2019, 01/31/2020, 12/05/2020   Moderna Sars-Covid-2 Vaccination 12/13/2019, 01/11/2020   PNEUMOCOCCAL CONJUGATE-20 12/05/2020   Pneumococcal Polysaccharide-23 05/13/2015     TDAP status: Due, Education has been provided regarding the importance of this vaccine. Advised may receive this vaccine at local pharmacy or Health Dept. Aware to provide a copy of the vaccination record if obtained from local pharmacy or Health Dept. Verbalized acceptance and understanding.  Flu Vaccine status: Due, Education has been provided regarding the importance of this vaccine. Advised may receive this vaccine at local pharmacy or Health Dept. Aware to provide a copy of the vaccination record if obtained from local pharmacy or Health Dept. Verbalized acceptance and understanding.  Pneumonia vaccine N/A  Covid-19 vaccine status: Information provided on how to obtain vaccines.   Qualifies for Shingles Vaccine? Yes   Zostavax completed No   Shingrix Completed?: No.    Education has been provided regarding the importance of this vaccine. Patient has been advised to call insurance company to determine out of pocket expense if they have not yet received this vaccine. Advised may also receive vaccine at local pharmacy or Health Dept. Verbalized acceptance and understanding.  Screening Tests Health Maintenance  Topic Date Due   TETANUS/TDAP  Never done   Zoster Vaccines- Shingrix (1 of 2) Never done   OPHTHALMOLOGY EXAM  10/16/2018   COVID-19 Vaccine (3 - Moderna risk series) 02/08/2020   INFLUENZA VACCINE  11/11/2021   Diabetic kidney evaluation - Urine ACR  05/22/2022   MAMMOGRAM  06/04/2022   Diabetic kidney evaluation - GFR measurement  06/25/2022   HEMOGLOBIN A1C  07/01/2022   FOOT EXAM  01/01/2023   COLONOSCOPY (Pts 45-86yr Insurance coverage will need to be confirmed)  10/30/2025   Hepatitis C Screening  Completed   HIV Screening  Completed   HPV VACCINES  Aged Out   PAP SMEAR-Modifier  Discontinued    Health Maintenance  Health Maintenance Due  Topic Date Due   TETANUS/TDAP  Never done   Zoster Vaccines- Shingrix (1 of 2) Never done   OPHTHALMOLOGY EXAM   10/16/2018   COVID-19 Vaccine (3 - Moderna risk series) 02/08/2020   INFLUENZA VACCINE  11/11/2021   Mammogram , she will make an appointment to get test done  Upto date wilh colonoscopy, next due in 2027    Lung Cancer Screening: (Low Dose CT Chest recommended if Age 64-80years, 30 pack-year currently smoking OR have quit w/in 15years.) does not qualify.   Lung Cancer Screening Referral:   Additional Screening:  Hepatitis C Screening: does not qualify; Completed   Vision Screening: Recommended annual ophthalmology exams for early detection of glaucoma and other disorders of the eye. Is the patient up to date with their annual eye exam?  No  Who is the provider or what is the name of the office in which the patient attends annual eye exams?  Referral is pending  If pt is not established with a provider, would they like to be referred to a provider to establish care?   Dental Screening: Recommended annual dental exams for proper oral hygiene  Community Resource Referral / Chronic Care Management: CRR required this visit?  No   CCM required this visit?  Yes      Plan:     I have personally reviewed and noted the following in the patient's chart:   Medical and social history Use of alcohol, tobacco or illicit drugs  Current medications and supplements including opioid prescriptions. Patient is not currently taking opioid prescriptions. Functional ability and status Nutritional status Physical activity Advanced directives List of other physicians Hospitalizations, surgeries, and ER visits in previous 12 months Vitals Screenings to include cognitive, depression, and falls Referrals and appointments  In addition, I have reviewed and discussed with patient certain preventive protocols, quality metrics, and best practice recommendations. A written personalized care plan for preventive services as well as general preventive health recommendations were provided to patient.      Renee Rival, FNP   01/07/2022   Nurse Notes:

## 2022-01-07 NOTE — Telephone Encounter (Signed)
   Telephone encounter was:  Unsuccessful.  01/07/2022 Name: Tracey [name] MRN: [id number] DOB: 1955/10/25  Unsuccessful outbound call made today to assist with:  Food Insecurity  Outreach Attempt:  1st Attempt  A HIPAA compliant voice message was left requesting a return call.  Instructed patient to call back at earliest convenience .    [facility], Care Management  [phone] [address], [place],  [address] Phone: [phone] Email: [email or web address]

## 2022-01-07 NOTE — Patient Instructions (Addendum)
  Ms. Koos , Thank you for taking time to come for your Medicare Wellness Visit. I appreciate your ongoing commitment to your health goals. Please review the following plan we discussed and let me know if I can assist you in the future.   These are the goals we discussed:  Goals      Exercise 3x per week (30 min per time)     Recommend starting a routine exercise program at least 3 days a week for 30-45 minutes at a time as tolerated.       Increase physical activity        This is a list of the screening recommended for you and due dates:  Health Maintenance  Topic Date Due   Tetanus Vaccine  Never done   Zoster (Shingles) Vaccine (1 of 2) Never done   Eye exam for diabetics  10/16/2018   COVID-19 Vaccine (3 - Moderna risk series) 02/08/2020   Flu Shot  11/11/2021   Yearly kidney health urinalysis for diabetes  05/22/2022   Mammogram  06/04/2022   Yearly kidney function blood test for diabetes  06/25/2022   Hemoglobin A1C  07/01/2022   Complete foot exam   01/01/2023   Colon Cancer Screening  10/30/2025   Hepatitis C Screening: USPSTF Recommendation to screen - Ages 18-79 yo.  Completed   HIV Screening  Completed   HPV Vaccine  Aged Out   Pap Smear  Discontinued

## 2022-01-09 ENCOUNTER — Telehealth: Payer: Self-pay

## 2022-01-09 NOTE — Telephone Encounter (Signed)
   Telephone encounter was:  Unsuccessful.  01/09/2022 Name: Tracey [name] MRN: [id number] DOB: 1956-01-09  Unsuccessful outbound call made today to assist with:  Food Insecurity  Outreach Attempt:  2nd Attempt  A HIPAA compliant voice message was left requesting a return call.  Instructed patient to call back at earliest convenience .    [place], Care Management  [phone] [address], [place], [place] [address] Phone: [phone] Email: [email or web address]

## 2022-01-09 NOTE — Addendum Note (Signed)
Addended by: Eual Fines on: 01/09/2022 01:36 PM   Modules accepted: Orders, Level of Service

## 2022-01-12 ENCOUNTER — Telehealth: Payer: Self-pay

## 2022-01-12 NOTE — Telephone Encounter (Signed)
   Telephone encounter was:  Successful.  01/12/2022 Name: KHARI LETT MRN: [id number] DOB: 1956-01-22  HAYLEY HORN is a 66 y.o. year old female who is a primary care patient of Moshe Cipro Norwood Levo, MD . The community resource team was consulted for assistance with [place] guide performed the following interventions: Patient provided with information about care guide support team and interviewed to confirm resource needs.I have mailed resources for food bamnks and added a referral in [facility]  Follow Up Plan:  No further follow up planned at this time. The patient has been provided with needed resources.  Marland Kitchenal

## 2022-01-15 ENCOUNTER — Ambulatory Visit: Payer: Medicare Other | Admitting: Podiatry

## 2022-01-19 ENCOUNTER — Other Ambulatory Visit: Payer: Self-pay | Admitting: Family Medicine

## 2022-01-27 ENCOUNTER — Telehealth: Payer: Self-pay | Admitting: Nurse Practitioner

## 2022-01-27 DIAGNOSIS — E1165 Type 2 diabetes mellitus with hyperglycemia: Secondary | ICD-10-CM

## 2022-01-27 NOTE — Telephone Encounter (Signed)
New message    The patient calling Meter saying low since yesterday .   Asking for a call back to discuss

## 2022-01-28 NOTE — Telephone Encounter (Signed)
Patient was called and a message was left, asking that she call our office back.  Wanting more information about her meter and it running low.

## 2022-01-30 NOTE — Telephone Encounter (Signed)
Patient presented to the office today. Johnanna Schneiders ,LPN  helped the patient. She was given a Dietitian and a BS meter. Patient was advised to call t he company about the possible 2 bad sensors.

## 2022-02-02 NOTE — Telephone Encounter (Signed)
F/u   The patient called Medicare was advised to send prescription and information [facility] in [place].

## 2022-02-02 NOTE — Telephone Encounter (Signed)
Left a message requesting pt return call to the office. ?

## 2022-02-03 MED ORDER — FREESTYLE LIBRE 2 SENSOR MISC: 2 refills | Status: DC

## 2022-02-03 MED ORDER — ACCU-CHEK GUIDE VI STRP: ORAL_STRIP | 2 refills | Status: DC

## 2022-02-03 NOTE — Telephone Encounter (Signed)
Rx's for libre 2 sensors and accu-chek guide test strips sent to [facility].

## 2022-02-06 ENCOUNTER — Telehealth: Payer: Self-pay | Admitting: Nurse Practitioner

## 2022-02-06 ENCOUNTER — Other Ambulatory Visit: Payer: Self-pay

## 2022-02-06 DIAGNOSIS — E1165 Type 2 diabetes mellitus with hyperglycemia: Secondary | ICD-10-CM

## 2022-02-06 MED ORDER — ACCU-CHEK GUIDE VI STRP: ORAL_STRIP | 2 refills | Status: DC

## 2022-02-06 NOTE — Telephone Encounter (Signed)
Can you try to sent her Elenor Legato sensors to [employer]. [facility] and its not covered at all there.

## 2022-02-06 NOTE — Telephone Encounter (Signed)
Done, sent order to [employer]

## 2022-02-09 ENCOUNTER — Other Ambulatory Visit: Payer: Self-pay

## 2022-02-09 DIAGNOSIS — E1165 Type 2 diabetes mellitus with hyperglycemia: Secondary | ICD-10-CM

## 2022-02-09 MED ORDER — ACCU-CHEK GUIDE VI STRP: ORAL_STRIP | 2 refills | Status: DC

## 2022-02-10 ENCOUNTER — Ambulatory Visit (INDEPENDENT_AMBULATORY_CARE_PROVIDER_SITE_OTHER): Payer: Medicare Other | Admitting: Family Medicine

## 2022-02-10 ENCOUNTER — Encounter: Payer: Self-pay | Admitting: Family Medicine

## 2022-02-10 ENCOUNTER — Telehealth: Payer: Self-pay | Admitting: Nurse Practitioner

## 2022-02-10 DIAGNOSIS — E1169 Type 2 diabetes mellitus with other specified complication: Secondary | ICD-10-CM | POA: Diagnosis not present

## 2022-02-10 DIAGNOSIS — E782 Mixed hyperlipidemia: Secondary | ICD-10-CM | POA: Diagnosis not present

## 2022-02-10 DIAGNOSIS — I10 Essential (primary) hypertension: Secondary | ICD-10-CM

## 2022-02-10 NOTE — Telephone Encounter (Signed)
Medicare wont pay for another reader for 5 years, in some cases they have allowed upgrades, and I can check on that with Tracey [name] next time she comes around.  The other option would for her to pay out of pocket for a new reader.  Remind me, what happened to her reader?

## 2022-02-10 NOTE — Patient Instructions (Addendum)
F/u in office in 5 month, call if you need me sooner  Congrats on improved blood sugar  Nurse visit for flu vaccine and fasting labs lipid, cmp and eGFR this week if possible  Pls put her on diabetic eye screen when next here , explain and let her know!  Pls get shingrix vaccines,Covid and TdAP at your pharmacy  Thanks for choosing [facility], we consider it a privelige to serve you.

## 2022-02-10 NOTE — Telephone Encounter (Signed)
[employer] left a VM stating she is not eligible for a Libre Reader because she got one back at the end of 2020. She is not eligible until the end of 2025. Please Advise. She needs access to a reader before they can send sensors.  [phone]

## 2022-02-10 NOTE — Telephone Encounter (Signed)
Gotcha. Thanks!

## 2022-02-10 NOTE — Telephone Encounter (Signed)
I called pt, she said she has a reader and they are going to ship her sensors. It was a misunderstanding on there end.

## 2022-02-10 NOTE — Progress Notes (Unsigned)
Virtual Visit via Telephone Note  I connected with Tracey [name] on 02/10/22 at  3:40 PM EDT by telephone and verified that I am speaking with the correct person using two identifiers.  Location: Patient: home  Provider: office   I discussed the limitations, risks, security and privacy concerns of performing an evaluation and management service by telephone and the availability of in person appointments. I also discussed with the patient that there may be a patient responsible charge related to this service. The patient expressed understanding and agreed to proceed.   History of Present Illness: F/u chronc problems and update health n maintainace. Doing well generally, improved blood sugar and weight loss Denies any recent fever, chill, cough I or uncontrolled allergy symptoms   Observations/Objective: There were no vitals taken for this visit. Good communication with no confusion and intact memory. AlertNo signs of respiratory distress during speech   Assessment and Plan:  Essential hypertension, benign DASH diet and commitment to daily physical activity for a minimum of 30 minutes discussed and encouraged, as a part of hypertension management. The importance of attaining a healthy weight is also discussed.     12/31/2021    8:50 AM 09/15/2021    2:11 PM 09/10/2021    3:51 PM 06/24/2021    3:07 PM 05/22/2021    1:48 PM 02/13/2021    1:03 PM 02/07/2021    3:11 PM  BP/Weight  Systolic BP 619 509 326 712 458 099   Diastolic BP 76 85 73 77 71 67   Wt. (Lbs) 367.4 368 364 374 376 379.6 382  BMI 52.72 kg/m2 52.8 kg/m2 52.23 kg/m2 53.66 kg/m2 53.95 kg/m2 54.47 kg/m2 54.81 kg/m2       Diabetes mellitus type 2 in obese (HCC) Controlled, no change in medication Ms. Dimmitt is reminded of the importance of commitment to daily physical activity for 30 minutes or more, as able and the need to limit carbohydrate intake to 30 to 60 grams per meal to help with blood sugar control.    The need to take medication as prescribed, test blood sugar as directed, and to call between visits if there is a concern that blood sugar is uncontrolled is also discussed.   Ms. Bujak is reminded of the importance of daily foot exam, annual eye examination, and good blood sugar, blood pressure and cholesterol control.     Latest Ref Rng & Units 12/31/2021    9:05 AM 09/15/2021    2:24 PM 06/24/2021    4:16 PM 05/22/2021    2:25 PM 05/22/2021    2:08 PM  Diabetic Labs  HbA1c 4.0 - 5.6 % 6.9  6.4   6.5    Microalbumin mg/L     30   Micro/Creat Ratio      <30   Chol 100 - 199 mg/dL   153     HDL >39 mg/dL   41     Calc LDL 0 - 99 mg/dL   86     Triglycerides 0 - 149 mg/dL   146     Creatinine 0.57 - 1.00 mg/dL   0.72         12/31/2021    8:50 AM 09/15/2021    2:11 PM 09/10/2021    3:51 PM 06/24/2021    3:07 PM 05/22/2021    1:48 PM 02/13/2021    1:03 PM 02/07/2021    3:11 PM  BP/Weight  Systolic BP 833 825 053 976 158 164  Diastolic BP 76 85 73 77 71 67   Wt. (Lbs) 367.4 368 364 374 376 379.6 382  BMI 52.72 kg/m2 52.8 kg/m2 52.23 kg/m2 53.66 kg/m2 53.95 kg/m2 54.47 kg/m2 54.81 kg/m2      12/31/2021    9:00 AM 02/13/2019   11:00 AM  Foot/eye exam completion dates  Foot Form Completion Done Done        Mixed hyperlipidemia Hyperlipidemia:Low fat diet discussed and encouraged.   Lipid Panel  Lab Results  Component Value Date   CHOL 153 06/24/2021   HDL 41 06/24/2021   LDLCALC 86 06/24/2021   TRIG 146 06/24/2021   CHOLHDL 3.7 06/24/2021     Controlled, no change in medication   Morbid obesity  Patient re-educated about  the importance of commitment to a  minimum of 150 minutes of exercise per week as able.  The importance of healthy food choices with portion control discussed, as well as eating regularly and within a 12 hour window most days. The need to choose "clean , green" food 50 to 75% of the time is discussed, as well as to make water the primary drink  and set a goal of 64 ounces water daily.       12/31/2021    8:50 AM 09/15/2021    2:11 PM 09/10/2021    3:51 PM  Weight /BMI  Weight 367 lb 6.4 oz 368 lb 364 lb  Height '5\' 10"'$  (1.778 m) '5\' 10"'$  (1.778 m) '5\' 10"'$  (1.778 m)  BMI 52.72 kg/m2 52.8 kg/m2 52.23 kg/m2      Follow Up Instructions:    I discussed the assessment and treatment plan with the patient. The patient was provided an opportunity to ask questions and all were answered. The patient agreed with the plan and demonstrated an understanding of the instructions.   The patient was advised to call back or seek an in-person evaluation if the symptoms worsen or if the condition fails to improve as anticipated.  I provided 8 minutes of non-face-to-face time during this encounter.   Tula Nakayama, MD

## 2022-02-11 ENCOUNTER — Encounter: Payer: Self-pay | Admitting: Family Medicine

## 2022-02-11 NOTE — Assessment & Plan Note (Signed)
Hyperlipidemia:Low fat diet discussed and encouraged.   Lipid Panel  Lab Results  Component Value Date   CHOL 153 06/24/2021   HDL 41 06/24/2021   LDLCALC 86 06/24/2021   TRIG 146 06/24/2021   CHOLHDL 3.7 06/24/2021     Controlled, no change in medication

## 2022-02-11 NOTE — Assessment & Plan Note (Signed)
DASH diet and commitment to daily physical activity for a minimum of 30 minutes discussed and encouraged, as a part of hypertension management. The importance of attaining a healthy weight is also discussed.     12/31/2021    8:50 AM 09/15/2021    2:11 PM 09/10/2021    3:51 PM 06/24/2021    3:07 PM 05/22/2021    1:48 PM 02/13/2021    1:03 PM 02/07/2021    3:11 PM  BP/Weight  Systolic BP 254 270 623 762 831 517   Diastolic BP 76 85 73 77 71 67   Wt. (Lbs) 367.4 368 364 374 376 379.6 382  BMI 52.72 kg/m2 52.8 kg/m2 52.23 kg/m2 53.66 kg/m2 53.95 kg/m2 54.47 kg/m2 54.81 kg/m2

## 2022-02-11 NOTE — Assessment & Plan Note (Signed)
  Patient re-educated about  the importance of commitment to a  minimum of 150 minutes of exercise per week as able.  The importance of healthy food choices with portion control discussed, as well as eating regularly and within a 12 hour window most days. The need to choose "clean , green" food 50 to 75% of the time is discussed, as well as to make water the primary drink and set a goal of 64 ounces water daily.       12/31/2021    8:50 AM 09/15/2021    2:11 PM 09/10/2021    3:51 PM  Weight /BMI  Weight 367 lb 6.4 oz 368 lb 364 lb  Height '5\' 10"'$  (1.778 m) '5\' 10"'$  (1.778 m) '5\' 10"'$  (1.778 m)  BMI 52.72 kg/m2 52.8 kg/m2 52.23 kg/m2

## 2022-02-11 NOTE — Assessment & Plan Note (Signed)
Controlled, no change in medication Tracey [name] is reminded of the importance of commitment to daily physical activity for 30 minutes or more, as able and the need to limit carbohydrate intake to 30 to 60 grams per meal to help with blood sugar control.   The need to take medication as prescribed, test blood sugar as directed, and to call between visits if there is a concern that blood sugar is uncontrolled is also discussed.   Tracey [name] is reminded of the importance of daily foot exam, annual eye examination, and good blood sugar, blood pressure and cholesterol control.     Latest Ref Rng & Units 12/31/2021    9:05 AM 09/15/2021    2:24 PM 06/24/2021    4:16 PM 05/22/2021    2:25 PM 05/22/2021    2:08 PM  Diabetic Labs  HbA1c 4.0 - 5.6 % 6.9  6.4   6.5    Microalbumin mg/L     30   Micro/Creat Ratio      <30   Chol 100 - 199 mg/dL   153     HDL >39 mg/dL   41     Calc LDL 0 - 99 mg/dL   86     Triglycerides 0 - 149 mg/dL   146     Creatinine 0.57 - 1.00 mg/dL   0.72         12/31/2021    8:50 AM 09/15/2021    2:11 PM 09/10/2021    3:51 PM 06/24/2021    3:07 PM 05/22/2021    1:48 PM 02/13/2021    1:03 PM 02/07/2021    3:11 PM  BP/Weight  Systolic BP 426 834 196 222 979 892   Diastolic BP 76 85 73 77 71 67   Wt. (Lbs) 367.4 368 364 374 376 379.6 382  BMI 52.72 kg/m2 52.8 kg/m2 52.23 kg/m2 53.66 kg/m2 53.95 kg/m2 54.47 kg/m2 54.81 kg/m2      12/31/2021    9:00 AM 02/13/2019   11:00 AM  Foot/eye exam completion dates  Foot Form Completion Done Done

## 2022-02-12 ENCOUNTER — Ambulatory Visit: Payer: Medicare Other | Admitting: Podiatry

## 2022-02-12 NOTE — Telephone Encounter (Signed)
She will need a new reader but medicare wont pay for it since she has had one in the last 5 years.  If her phone is compatible, she can try downloading the [place] 2 app and get her glucose readings there.

## 2022-02-12 NOTE — Telephone Encounter (Signed)
Patient was called and a message was left with Whitney's recommendation. Patient was advised to call office back if she had anymore questions.

## 2022-02-12 NOTE — Telephone Encounter (Signed)
Patient said they sent her [place] 2 [employer]ensors. She said she has the [place] 14 day. Please advise on what she needs to do?

## 2022-02-16 ENCOUNTER — Telehealth: Payer: Self-pay | Admitting: Nurse Practitioner

## 2022-02-16 DIAGNOSIS — E1165 Type 2 diabetes mellitus with hyperglycemia: Secondary | ICD-10-CM

## 2022-02-16 MED ORDER — ACCU-CHEK GUIDE VI STRP: ORAL_STRIP | 2 refills | Status: DC

## 2022-02-16 NOTE — Telephone Encounter (Signed)
Last week Tracey [name] sent Accu Chek Guide to [facility]. They can not fill it there because of medicare

## 2022-02-16 NOTE — Telephone Encounter (Signed)
What meter is she using currently?

## 2022-02-16 NOTE — Telephone Encounter (Signed)
Patient said she needs her testing strips to [facility].

## 2022-02-16 NOTE — Telephone Encounter (Signed)
Done.  I apologize, I overlooked the script on her med list before.

## 2022-02-16 NOTE — Telephone Encounter (Signed)
Patient does not have a smart phone.

## 2022-02-16 NOTE — Telephone Encounter (Signed)
I guess she will have to go back to fingersticks for the time being.  I can ask Claiborne Billings if she has another [place] 2 reader somewhere she could use.

## 2022-02-16 NOTE — Telephone Encounter (Signed)
Patient was called and a message was left on her voicemail.

## 2022-02-17 ENCOUNTER — Ambulatory Visit (HOSPITAL_COMMUNITY): Payer: Medicare Other

## 2022-02-17 ENCOUNTER — Ambulatory Visit (HOSPITAL_COMMUNITY): Admission: RE | Admit: 2022-02-17 | Payer: Medicare Other | Source: Ambulatory Visit

## 2022-02-19 ENCOUNTER — Ambulatory Visit (INDEPENDENT_AMBULATORY_CARE_PROVIDER_SITE_OTHER): Payer: Self-pay | Admitting: Podiatry

## 2022-02-19 DIAGNOSIS — Z91199 Patient's noncompliance with other medical treatment and regimen due to unspecified reason: Secondary | ICD-10-CM

## 2022-02-20 NOTE — Progress Notes (Signed)
Patient was no-show for appointment today 

## 2022-02-23 ENCOUNTER — Other Ambulatory Visit: Payer: Self-pay | Admitting: Family Medicine

## 2022-03-10 ENCOUNTER — Ambulatory Visit (HOSPITAL_COMMUNITY): Payer: Medicare Other

## 2022-03-10 ENCOUNTER — Inpatient Hospital Stay (HOSPITAL_COMMUNITY): Admission: RE | Admit: 2022-03-10 | Payer: Medicare Other | Source: Ambulatory Visit

## 2022-03-10 NOTE — Progress Notes (Deleted)
Cardiology Office Note   Date:  03/10/2022   ID:  Tracey [name], DOB [birth year], MRN [id number]  PCP:  Tracey Helper, MD  Cardiologist:   [name], MD   Pt presents for f/u of HTN and chronic SOB      History of Present Illness: Tracey [name] is a 66 y.o. female with a history of CP, DM, HTN, PE  HL and morbid obesity.  Dobutamine echo in 2014 normal   Echo in 2020 LVEF normal    I last saw the pt in July 2022   BP at time 140s/  Her HR was fast when I saw her  110s   Labs checked     Patient says that with walking in apartment she will get a little SOB   She has to crawl up steps due to unsteadiness, concern of falling  She denies CP     I saw the pt in Aug 2022   No outpatient medications have been marked as taking for the 03/13/22 encounter (Appointment) with Tracey Records, MD.     Allergies:   Haloperidol lactate and Lipitor [atorvastatin calcium]   Past Medical History:  Diagnosis Date   Arthritis    Diabetes mellitus without complication ([place])    Diastolic dysfunction 06/2438   Grade 1   GERD (gastroesophageal reflux disease)    Hyperlipidemia    Hyperlipidemia    Hypertension 04/15/2012   Morbid obesity ([place])    PE (pulmonary embolism)    Pulmonary emboli ([place]) 1999   post TAH   Sleep apnea     Past Surgical History:  Procedure Laterality Date   ABDOMINAL HYSTERECTOMY  1999   , totalfor fibroids   [place]   COLONOSCOPY N/A 10/31/2015   Procedure: COLONOSCOPY;  Surgeon: Rogene Houston, MD;  [facility];  Service: Endoscopy;  Laterality: N/A;  930   FOOT SURGERY Right    October 2022     Social History:  The patient  reports that she has never smoked. She has never used smokeless tobacco. She reports that she does not drink alcohol and does not use drugs.   Family History:  The patient's family history includes Alcohol abuse in her father; Aneurysm in her brother; Arthritis in her  sister; Cirrhosis in her father; Coronary artery disease in her sister; Depression in her mother and son; Diabetes in her mother; Heart attack (age of onset: 25) in her sister; Heart murmur in her son; Hyperlipidemia in her father; Hypertension in her father, mother, and son; Sleep apnea in her sister.    ROS:  Please see the history of present illness. All other systems are reviewed and  Negative to the above problem except as noted.    PHYSICAL EXAM: VS:  There were no vitals taken for this visit.  GEN: Morbidly obese 66 yo in no acute distress  HEENT: normal  Neck: no obvious JVD Cardiac: RRR; no murmurs,,Triv to 1+ LE  edema  Respiratory:  clear to auscultation bilaterally,  GI: soft, Obese Skin: warm and dry, no rash Neuro:  Grossly intact Psych: euthymic mood, full affect   EKG:  EKG is not ordered today.    Lipid Panel    Component Value Date/Time   CHOL 153 06/24/2021 1616   TRIG 146 06/24/2021 1616   HDL 41 06/24/2021 1616   CHOLHDL 3.7 06/24/2021 1616   CHOLHDL 3.5 02/01/2020 1415  VLDL 23 07/13/2016 1430   LDLCALC 86 06/24/2021 1616   LDLCALC 75 02/01/2020 1415      Wt Readings from Last 3 Encounters:  12/31/21 (!) 367 lb 6.4 oz (166.7 kg)  09/15/21 (!) 368 lb (166.9 kg)  09/10/21 (!) 364 lb (165.1 kg)      ASSESSMENT AND PLAN:  1  Tachycardia   Each time the pt has been in clinic she has been mildly tachycardic   EKG with ST    Will get monitor to evaluate 24 hour control      2  Hx of HTN  BP is fair  Follow   Will check BNP    3 Hx of CP   PT denies pain   4  DM   last A1C is 8%      5.   HL     Curr  off of statin         Current medicines are reviewed at length with the patient today.  The patient does not have concerns regarding medicines.  Signed, [name], MD  03/10/2022 7:11 PM    [facility] [address], [place], [place]  [address] Phone: [phone]; Fax: [phone]

## 2022-03-13 ENCOUNTER — Ambulatory Visit: Payer: Medicare Other | Admitting: Internal Medicine

## 2022-03-26 ENCOUNTER — Ambulatory Visit (HOSPITAL_COMMUNITY): Payer: Medicare Other

## 2022-03-26 ENCOUNTER — Inpatient Hospital Stay (HOSPITAL_COMMUNITY): Admission: RE | Admit: 2022-03-26 | Payer: Medicare Other | Source: Ambulatory Visit

## 2022-03-31 ENCOUNTER — Ambulatory Visit: Payer: Medicare Other | Admitting: Podiatry

## 2022-04-16 ENCOUNTER — Ambulatory Visit: Payer: Medicare Other | Admitting: Podiatry

## 2022-04-17 ENCOUNTER — Ambulatory Visit (INDEPENDENT_AMBULATORY_CARE_PROVIDER_SITE_OTHER): Payer: Medicare Other | Admitting: Podiatry

## 2022-04-17 ENCOUNTER — Encounter: Payer: Self-pay | Admitting: Podiatry

## 2022-04-17 DIAGNOSIS — B351 Tinea unguium: Secondary | ICD-10-CM

## 2022-04-17 DIAGNOSIS — M79675 Pain in left toe(s): Secondary | ICD-10-CM

## 2022-04-17 DIAGNOSIS — R2241 Localized swelling, mass and lump, right lower limb: Secondary | ICD-10-CM

## 2022-04-17 DIAGNOSIS — E1142 Type 2 diabetes mellitus with diabetic polyneuropathy: Secondary | ICD-10-CM | POA: Diagnosis not present

## 2022-04-17 DIAGNOSIS — M79674 Pain in right toe(s): Secondary | ICD-10-CM | POA: Diagnosis not present

## 2022-04-17 NOTE — Progress Notes (Signed)
This patient returns to my office for at risk foot care.  This patient requires this care by a professional since this patient will be at risk due to having diabetes mellitus.  This patient is unable to cut nails herself since the patient cannot reach her nails.These nails are painful walking and wearing shoes.  This patient presents for at risk foot care today.  General Appearance  Alert, conversant and in no acute stress.  Vascular  Dorsalis pedis and posterior tibial  pulses are not palpable due to swelling  bilaterally.  Capillary return is within normal limits  bilaterally. Temperature is within normal limits  bilaterally.  Neurologic  Senn-Weinstein monofilament wire test within normal limits  bilaterally. Muscle power within normal limits bilaterally.  Nails Thick disfigured discolored nails with subungual debris  from hallux to fifth toes bilaterally. No evidence of bacterial infection or drainage bilaterally.  Orthopedic  No limitations of motion  feet .  No crepitus or effusions noted.  No bony pathology or digital deformities noted.  Skin  normotropic skin with no porokeratosis noted bilaterally.  No signs of infections or ulcers noted.     Onychomycosis  Pain in right toes  Pain in left toes  Consent was obtained for treatment procedures.   Mechanical debridement of nails 1-5  bilaterally performed with a nail nipper.  Filed with dremel without incident.    Return office visit    3 months                  Told patient to return for periodic foot care and evaluation due to potential at risk complications.   Gardiner Barefoot DPM

## 2022-04-30 ENCOUNTER — Ambulatory Visit (HOSPITAL_COMMUNITY): Payer: Medicare Other

## 2022-04-30 ENCOUNTER — Telehealth: Payer: Self-pay | Admitting: Internal Medicine

## 2022-04-30 ENCOUNTER — Inpatient Hospital Stay (HOSPITAL_COMMUNITY): Admission: RE | Admit: 2022-04-30 | Payer: Medicare Other | Source: Ambulatory Visit

## 2022-04-30 NOTE — Telephone Encounter (Signed)
Pt returning call that she received this morning. Please advise

## 2022-04-30 NOTE — Telephone Encounter (Signed)
Tracey [name] did not call patient

## 2022-05-04 ENCOUNTER — Ambulatory Visit: Payer: Medicare Other | Admitting: Nurse Practitioner

## 2022-05-04 DIAGNOSIS — I10 Essential (primary) hypertension: Secondary | ICD-10-CM

## 2022-05-04 DIAGNOSIS — E119 Type 2 diabetes mellitus without complications: Secondary | ICD-10-CM

## 2022-05-04 DIAGNOSIS — E559 Vitamin D deficiency, unspecified: Secondary | ICD-10-CM

## 2022-05-04 DIAGNOSIS — E782 Mixed hyperlipidemia: Secondary | ICD-10-CM

## 2022-05-20 ENCOUNTER — Telehealth: Payer: Self-pay | Admitting: *Deleted

## 2022-05-20 NOTE — Patient Outreach (Signed)
  Care Coordination   05/20/2022 Name: Tracey [name] MRN: [id number] DOB: [birth year]   Care Coordination Outreach Attempts:  A second unsuccessful outreach was attempted today to offer the patient with information about available care coordination services as a benefit of their health plan.     Follow Up Plan:  Additional outreach attempts will be made to offer the patient care coordination information and services.   Encounter Outcome:  No Answer   Care Coordination Interventions:  No, not indicated    Curtina Grills C. Myrtie Neither, MSN, [facility] Gerontological Nurse Practitioner [facility] Care Management [phone]

## 2022-05-24 NOTE — Progress Notes (Deleted)
Cardiology Office Note   Date:  05/24/2022   ID:  Tracey [name], DOB 25-Jan-1956, MRN [id number]  PCP:  Fayrene Helper, MD  Cardiologist:   [name], MD   Pt presents for f/u of HTN and chronic SOB      History of Present Illness: Tracey [name] is a 67 y.o. female with a history of CP, DM, HTN, PE  HL and morbid obesity.  Dobutamine echo in 2014 normal   Echo in 2020 LVEF normal    I last saw the pt in July 2022   BP at time 140s/  Her HR was fast when I saw her  110s   Labs checked     Patient says that with walking in apartment she will get a little SOB   She has to crawl up steps due to unsteadiness, concern of falling  She denies CP     I aw the pt in clinic in 2022   No outpatient medications have been marked as taking for the 05/25/22 encounter (Appointment) with Fay Records, MD.     Allergies:   Haloperidol lactate and Lipitor [atorvastatin calcium]   Past Medical History:  Diagnosis Date   Arthritis    Diabetes mellitus without complication ([place])    Diastolic dysfunction [id number]   Grade 1   GERD (gastroesophageal reflux disease)    Hyperlipidemia    Hyperlipidemia    Hypertension 04/15/2012   Morbid obesity ([place])    PE (pulmonary embolism)    Pulmonary emboli ([place]) 1999   post TAH   Sleep apnea     Past Surgical History:  Procedure Laterality Date   ABDOMINAL HYSTERECTOMY  1999   , totalfor fibroids   [place]   COLONOSCOPY N/A 10/31/2015   Procedure: COLONOSCOPY;  Surgeon: Rogene Houston, MD;  [facility];  Service: Endoscopy;  Laterality: N/A;  930   FOOT SURGERY Right    October 2022     Social History:  The patient  reports that she has never smoked. She has never used smokeless tobacco. She reports that she does not drink alcohol and does not use drugs.   Family History:  The patient's family history includes Alcohol abuse in her father; Aneurysm in her brother; Arthritis in her  sister; Cirrhosis in her father; Coronary artery disease in her sister; Depression in her mother and son; Diabetes in her mother; Heart attack (age of onset: 75) in her sister; Heart murmur in her son; Hyperlipidemia in her father; Hypertension in her father, mother, and son; Sleep apnea in her sister.    ROS:  Please see the history of present illness. All other systems are reviewed and  Negative to the above problem except as noted.    PHYSICAL EXAM: VS:  There were no vitals taken for this visit.  GEN: Morbidly obese 67 yo in no acute distress  HEENT: normal  Neck: no obvious JVD Cardiac: RRR; no murmurs,,Triv to 1+ LE  edema  Respiratory:  clear to auscultation bilaterally,  GI: soft, Obese Skin: warm and dry, no rash Neuro:  Grossly intact Psych: euthymic mood, full affect   EKG:  EKG is not ordered today.    Lipid Panel    Component Value Date/Time   CHOL 153 06/24/2021 1616   TRIG 146 06/24/2021 1616   HDL 41 06/24/2021 1616   CHOLHDL 3.7 06/24/2021 1616   CHOLHDL 3.5 02/01/2020 1415  VLDL 23 07/13/2016 1430   LDLCALC 86 06/24/2021 1616   LDLCALC 75 02/01/2020 1415      Wt Readings from Last 3 Encounters:  12/31/21 (!) 367 lb 6.4 oz (166.7 kg)  09/15/21 (!) 368 lb (166.9 kg)  09/10/21 (!) 364 lb (165.1 kg)      ASSESSMENT AND PLAN:  1  Tachycardia   Each time the pt has been in clinic she has been mildly tachycardic   EKG with ST    Will get monitor to evaluate 24 hour control      2  Hx of HTN  BP is fair  Follow   Will check BNP    3 Hx of CP   PT denies pain   4  DM   last A1C is 8%      5.   HL     Curr  off of statin         Current medicines are reviewed at length with the patient today.  The patient does not have concerns regarding medicines.  Signed, [name], MD  05/24/2022 9:53 PM    [facility] [address], [place], [place]  [address] Phone: [phone]; Fax: [phone]

## 2022-05-25 ENCOUNTER — Ambulatory Visit: Payer: Medicare Other | Admitting: Internal Medicine

## 2022-05-26 ENCOUNTER — Ambulatory Visit (HOSPITAL_COMMUNITY)
Admission: RE | Admit: 2022-05-26 | Discharge: 2022-05-26 | Disposition: A | Discharge status: Home / Self Care | Payer: Medicare Other | Source: Ambulatory Visit | Attending: Family Medicine | Admitting: Family Medicine

## 2022-05-26 DIAGNOSIS — N6489 Other specified disorders of breast: Secondary | ICD-10-CM | POA: Diagnosis not present

## 2022-05-26 DIAGNOSIS — N6311 Unspecified lump in the right breast, upper outer quadrant: Secondary | ICD-10-CM | POA: Diagnosis not present

## 2022-05-26 DIAGNOSIS — R928 Other abnormal and inconclusive findings on diagnostic imaging of breast: Secondary | ICD-10-CM | POA: Diagnosis not present

## 2022-05-26 DIAGNOSIS — N6313 Unspecified lump in the right breast, lower outer quadrant: Secondary | ICD-10-CM | POA: Diagnosis not present

## 2022-06-02 ENCOUNTER — Telehealth: Payer: Self-pay | Admitting: *Deleted

## 2022-06-02 ENCOUNTER — Ambulatory Visit: Payer: Medicare Other | Admitting: Nurse Practitioner

## 2022-06-02 DIAGNOSIS — I10 Essential (primary) hypertension: Secondary | ICD-10-CM

## 2022-06-02 DIAGNOSIS — E119 Type 2 diabetes mellitus without complications: Secondary | ICD-10-CM

## 2022-06-02 DIAGNOSIS — E782 Mixed hyperlipidemia: Secondary | ICD-10-CM

## 2022-06-02 DIAGNOSIS — E559 Vitamin D deficiency, unspecified: Secondary | ICD-10-CM

## 2022-06-02 NOTE — Patient Outreach (Signed)
  Care Coordination   Initial Visit Note   06/02/2022 Name: Tracey [name] MRN: [id number] DOB: 03/30/1956  Tracey [name] is a 67 y.o. year old female who sees Moshe Cipro, Norwood Levo, MD for primary care. I spoke with  Duane Boston by phone today.  What matters to the patients health and wellness today?  PT AGREES TO ENGAGE IN THE HEALTH EQUITY PROGRAM AND TO A HOME VISIT ON MARCH 1ST.   Goals Addressed   None     SDOH assessments and interventions completed:  No     Care Coordination Interventions:  No, not indicated   Follow up plan:  HOME VISIT SCHEDULED FOR 06/12/22.    Encounter Outcome:  Pt. Visit Completed   Kayleen Memos C. Myrtie Neither, MSN, [facility] Gerontological Nurse Practitioner [facility] Care Management [phone]

## 2022-06-11 ENCOUNTER — Telehealth: Payer: Self-pay | Admitting: Internal Medicine

## 2022-06-11 MED ORDER — METOPROLOL TARTRATE 50 MG PO TABS: ORAL_TABLET | ORAL | 0 refills | Status: DC

## 2022-06-11 NOTE — Telephone Encounter (Signed)
Completed.

## 2022-06-11 NOTE — Telephone Encounter (Signed)
*  STAT* If patient is at the pharmacy, call can be transferred to refill team.   1. Which medications need to be refilled? (please list name of each medication and dose if known) metoprolol tartrate (LOPRESSOR) 50 MG tablet   2. Which pharmacy/location (including street and city if local pharmacy) is medication to be sent to? [place]   3. Do they need a 30 day or 90 day supply? [place] [facility]

## 2022-06-12 ENCOUNTER — Ambulatory Visit: Payer: Self-pay | Admitting: *Deleted

## 2022-06-15 ENCOUNTER — Encounter: Payer: Self-pay | Admitting: *Deleted

## 2022-06-15 NOTE — Patient Outreach (Signed)
Care Coordination   Home Visit Note   06/16/2022 Late documentation for visit on 06/12/22. Name: Tracey [name] MRN: [id number] DOB: Jul 04, 1955  Tracey [name] is a 67 y.o. year old female who sees Lodema Hong, Milus Mallick, MD for primary care. I visited  [name] in their home today.  What matters to the patients health and wellness today?  My mobility is very poor, my knees give out, I get SOB, I have chronic pain. I am a diabetic but that is in pretty good control. I haven't had an eye exam and I have some visual problems. I could use some in home assistance.   Goals Addressed             This Visit's Progress    I will keep my HgbA1C <7.0 over the next year. Last was 6.9 on 12/31/21.       I will schedule an eye exam during the next 30 days.       Increase physical activity   Not on track    [facility] Note:   Interventions Today    Flowsheet Row Most Recent Value  Chronic Disease   Chronic disease during today's visit Diabetes, Hypertension (HTN), Other  [Morbid Obesity]  General Interventions   General Interventions Discussed/Reviewed General Interventions Discussed, General Interventions Reviewed, Annual Eye Exam, Labs, Annual Foot Exam, Lipid Profile, Durable Medical Equipment (DME), Vaccines, Health Screening, Sick Day Rules, [facility], Doctor Visits  Labs Hgb A1c every 6 months  Vaccines COVID-19, Flu, Pneumonia, RSV, Shingles, Tetanus/Pertussis/Diphtheria  Doctor Visits Discussed/Reviewed Doctor Visits Reviewed, Annual Wellness Visits, PCP, Specialist  Health Screening Mammogram, Colonoscopy, Bone Density  Durable Medical Equipment (DME) BP Cuff, Walker  [Has CGM, ordered XL BP cuff from [facility]]  PCP/Specialist Visits Compliance with follow-up visit  Exercise Interventions   Exercise Discussed/Reviewed Exercise Discussed, Weight Managment  [Pt is unable to do standing exercise due to leg weakness and body habitus. Sending exercise book which includes chair  exercises. Encouraged pt that she needs to do this.]  Weight Management Weight loss  Education Interventions   Education Provided Provided Therapist, sports, Provided Education  Provided Verbal Education On Nutrition, Foot Care, Eye Care, Labs, Blood Sugar Monitoring, Exercise, Medication, When to see the doctor, [employer] Reviewed Hgb A1c, Kidney Function, Lipid Profile  [Last HgbA1C was 6.9 on 12/31/21.]  Mental Health Interventions   Mental Health Discussed/Reviewed Mental Health Reviewed, Coping Strategies  Nutrition Interventions   Nutrition Discussed/Reviewed Nutrition Discussed, Nutrition Reviewed, Carbohydrate meal planning  Pharmacy Interventions   Pharmacy Dicussed/Reviewed Medications and their functions, Medication Adherence  Safety Interventions   Safety Discussed/Reviewed Safety Discussed, Fall Risk, Home Safety  Home Safety Assistive Devices  [Discussed moving pt bed downstairs. She is crawling up the steps and this is not safe.]  Advanced Directive Interventions   Advanced Directives Discussed/Reviewed Advanced Directives Discussed, Provided resource for acquiring and filling out documents             SDOH assessments and interventions completed:  Yes  SDOH Interventions Today    Flowsheet Row Most Recent Value  SDOH Interventions   Food Insecurity Interventions Intervention Not Indicated  [Previouosly referred by another [facility] health team member. Currently has food and knows where she can get help.]  Housing Interventions Intervention Not Indicated  Transportation Interventions Intervention Not Indicated  Utilities Interventions Intervention Not Indicated  Alcohol Usage Interventions Intervention Not Indicated (Score <7)  Financial Strain Interventions Intervention Not Indicated  [Pt always  finds a way to make ends meet.]  Physical Activity Interventions Other (Comments)  [Provided chair exercises booklet. Pt is so physically disabled it is hard for  her to go anywhere.]  Stress Interventions Intervention Not Indicated  Social Connections Interventions Intervention Not Indicated  [Pt has lots of family connections and he son lives with her.]        Care Coordination Interventions:  Yes, provided   Request Dr. Lodema Hong assistance to apply for [facility] services.   Follow up plan: Follow up call scheduled for 1 month.    Encounter Outcome:  Pt. Visit Completed   Noralyn Pick C. Burgess Estelle, MSN, [facility] Gerontological Nurse Practitioner [facility] Care Management [phone]

## 2022-06-23 ENCOUNTER — Other Ambulatory Visit: Payer: Self-pay | Admitting: Family Medicine

## 2022-06-27 ENCOUNTER — Other Ambulatory Visit: Payer: Self-pay | Admitting: Nurse Practitioner

## 2022-07-01 NOTE — Progress Notes (Deleted)
Cardiology Office Note   Date:  07/01/2022   ID:  [name], Tracey [birth year], MRN [id number]  PCP:  Fayrene Helper, MD  Cardiologist:  Dr Harrington Challenger    No chief complaint on file.     History of Present Illness: Tracey [name] is a 67 y.o. female who presents for ***  Last seen by Dr. Harrington Challenger 12/05/20 history of CP, DM, HTN, PE  HL and morbid obesity.  Dobutamine echo in 2014 normal   Echo in 2020 LVEF normal     I last saw the pt in July 2022   BP at time 140s/  Her HR was fast when I saw her  110s   Labs checked       Patient says that with walking in apartment she will get a little SOB   She has to crawl up steps due to unsteadiness, concern of falling  She denies CP       Past Medical History:  Diagnosis Date   Arthritis    Diabetes mellitus without complication (HCC)    Diastolic dysfunction [id number]   Grade 1   GERD (gastroesophageal reflux disease)    Hyperlipidemia    Hyperlipidemia    Hypertension 04/15/2012   Morbid obesity ([place])    PE (pulmonary embolism)    Pulmonary emboli ([place]) 1999   post TAH   Sleep apnea     Past Surgical History:  Procedure Laterality Date   ABDOMINAL HYSTERECTOMY  1999   , totalfor fibroids   [place]   COLONOSCOPY N/A 10/31/2015   Procedure: COLONOSCOPY;  Surgeon: Rogene Houston, MD;  Location: [facility];  Service: Endoscopy;  Laterality: N/A;  930   FOOT SURGERY Right    October 2022     Current Outpatient Medications  Medication Sig Dispense Refill   acetaminophen (TYLENOL) 500 MG tablet Take 500 mg by mouth every 6 (six) hours as needed for moderate pain.     aspirin EC 81 MG tablet Take 1 tablet (81 mg total) by mouth daily. (Patient not taking: Reported on 02/10/2022) 30 tablet 5   blood glucose meter kit and supplies KIT Dispense based on patient and insurance preference. Use up to four times daily as directed. 1 each 0   Cholecalciferol (VITAMIN D-3) 125 MCG (5000 UT)  TABS Take 5,000 Units by mouth daily. (Patient not taking: Reported on 02/10/2022) 30 tablet 6   Continuous Blood Gluc Sensor (FREESTYLE LIBRE 2 SENSOR) MISC CHANGE SENSOR EVERY 14 DAYS 2 each 2   glucose blood (ACCU-CHEK GUIDE) test strip USE 1 STRIP TO CHECK GLUCOSE 3 TIMES DAILY AS DIRECTED 100 each 2   insulin regular human CONCENTRATED (HUMULIN R U-500 KWIKPEN) 500 UNIT/ML KwikPen Inject 25 Units into the skin 3 (three) times daily with meals. INJECT 35 UNITS SUBCUTANEOUSLY INTO THE SKIN 3 TIMES DAILY WITH MEALS. 15 mL 3   Lancets MISC Use to test BG qid. Dx : E11.65. Softclix Lancets 200 each 2   metFORMIN (GLUCOPHAGE) 1000 MG tablet TAKE ONE TABLET BY MOUTH TWICE A DAY WITH A MEAL 180 tablet 0   metoprolol tartrate (LOPRESSOR) 50 MG tablet TAKE ONE TABLET (50MG  TOTAL) BY MOUTH TWO TIMES DAILY 60 tablet 0   mupirocin ointment (BACTROBAN) 2 % Apply to affected area once daily. 30 g 1   olmesartan (BENICAR) 20 MG tablet TAKE ONE TABLET (20MG  TOTAL) BY MOUTH DAILY 30 tablet 3   omeprazole (  PRILOSEC) 20 MG capsule TAKE ONE CAPSULE BY MOUTH DAILY 90 capsule 3   OZEMPIC, 1 MG/DOSE, 4 MG/3ML SOPN INJECT 1MG  ONCE A WEEK AS DIRECTED 3 mL 0   rosuvastatin (CRESTOR) 20 MG tablet TAKE ONE TABLET (20MG  TOTAL) BY MOUTH DAILY 90 tablet 1   spironolactone (ALDACTONE) 25 MG tablet TAKE ONE (1) TABLET BY MOUTH EVERY DAY 90 tablet 1   UNIFINE PENTIPS 31G X 8 MM MISC USE FOR INSULIN INJECTIONS THREE TIMES DAILY. 100 each 3   No current facility-administered medications for this visit.    Allergies:   Haloperidol lactate and Lipitor [atorvastatin calcium]    Social History:  The patient  reports that she has never smoked. She has never used smokeless tobacco. She reports that she does not drink alcohol and does not use drugs.   Family History:  The patient's ***family history includes Alcohol abuse in her father; Aneurysm in her brother; Arthritis in her sister; Cirrhosis in her father; Coronary artery  disease in her sister; Depression in her mother and son; Diabetes in her mother; Heart attack (age of onset: 75) in her sister; Heart murmur in her son; Hyperlipidemia in her father; Hypertension in her father, mother, and son; Sleep apnea in her sister.    ROS:  General:no colds or fevers, no weight changes Skin:no rashes or ulcers HEENT:no blurred vision, no congestion CV:see HPI PUL:see HPI GI:no diarrhea constipation or melena, no indigestion GU:no hematuria, no dysuria MS:no joint pain, no claudication Neuro:no syncope, no lightheadedness Endo:no diabetes, no thyroid disease Wt Readings from Last 3 Encounters:  12/31/21 (!) 367 lb 6.4 oz (166.7 kg)  09/15/21 (!) 368 lb (166.9 kg)  09/10/21 (!) 364 lb (165.1 kg)     PHYSICAL EXAM: VS:  There were no vitals taken for this visit. , BMI There is no height or weight on file to calculate BMI. General:Pleasant affect, NAD Skin:Warm and dry, brisk capillary refill HEENT:normocephalic, sclera clear, mucus membranes moist Neck:supple, no JVD, no bruits  Heart:S1S2 RRR without murmur, gallup, rub or click Lungs:clear without rales, rhonchi, or wheezes [id number], non tender, + BS, do not palpate liver spleen or masses Ext:no lower ext edema, 2+ pedal pulses, 2+ radial pulses Neuro:alert and oriented, MAE, follows commands, + facial symmetry    EKG:  EKG is ordered today. The ekg ordered today demonstrates ***   Recent Labs: No results found for requested labs within last 365 days.    Lipid Panel    Component Value Date/Time   CHOL 153 06/24/2021 1616   TRIG 146 06/24/2021 1616   HDL 41 06/24/2021 1616   CHOLHDL 3.7 06/24/2021 1616   CHOLHDL 3.5 02/01/2020 1415   VLDL 23 07/13/2016 1430   LDLCALC 86 06/24/2021 1616   LDLCALC 75 02/01/2020 1415       Other studies Reviewed: Additional studies/ records that were reviewed today include: ***. Monitor  SR palpitations longest run 11 beats.  HR controlled.  Echo   IMPRESSIONS     1. The left ventricle has hyperdynamic systolic function of [id number]. The  cavity size is normal. There is mildly increased left ventricular wall  thickness. Indeterminate diastolic function.   2. The right ventricle has normal systolic function. The cavity in normal  in size. There is no increase in right ventricular wall thickness. Right  ventricular systolic pressure could not be assessed.   3. The aortic valve is tricuspid. There is mild aortic annular  calcification noted.   4. The mitral valve  is normal in structure.   5. The pulmonic valve is grossly normal.   6. The aortic root is normal in size and structure.   FINDINGS   Left Ventricle: The left ventricle has hyperdynamic systolic function of  [id number]. The cavity size is normal. There is mildly increased left  ventricular wall thickness. Indeterminate diastolic function  Right Ventricle: The right ventricle has normal systolic function. The  cavity in normal in size. There is no increase in right ventricular wall  thickness. Right ventricular systolic pressure could not be assessed.  Left Atrium: is normal in size  Right Atrium: is normal in size  Interatrial Septum: No atrial level shunt detected by color flow Doppler.   Pericardium: There is no evidence of pericardial effusion. There is a  pericardial fat pad noted.  Mitral Valve: The mitral valve is normal in structure Mitral valve  regurgitation is trivial by color flow Doppler.  Tricuspid Valve: The tricuspid valve is normal in structure. Tricuspid  valve regurgitation is trivial by color flow Doppler.  Aortic Valve: The aortic valve is tricuspid. There is mild aortic annular  calcification noted.  Pulmonic Valve: The pulmonic valve was not well visualized. The pulmonic  valve is grossly normal. Pulmonic valve regurgitation is not visualized by  color flow Doppler.  Aorta: The aortic root is normal in size and structure.  Venous: The inferior vena cava  was not well visualized.     ASSESSMENT AND PLAN:  1.  ***   Current medicines are reviewed with the patient today.  The patient Has no concerns regarding medicines.  The following changes have been made:  See above Labs/ tests ordered today include:see above  Disposition:   FU:  see above  Signed, Cecilie Kicks, NP  07/01/2022 8:44 PM    [facility] [place], [place], [place] [address] [place], [place] Phone: [phone]; Fax: [phone]

## 2022-07-02 ENCOUNTER — Ambulatory Visit: Payer: Medicare Other | Admitting: Cardiology

## 2022-07-06 ENCOUNTER — Encounter: Payer: Self-pay | Admitting: Nurse Practitioner

## 2022-07-06 ENCOUNTER — Ambulatory Visit (INDEPENDENT_AMBULATORY_CARE_PROVIDER_SITE_OTHER): Payer: Medicare Other | Admitting: Nurse Practitioner

## 2022-07-06 VITALS — BP 124/82 | HR 106 | Ht 70.0 in | Wt 356.2 lb

## 2022-07-06 DIAGNOSIS — Z794 Long term (current) use of insulin: Secondary | ICD-10-CM

## 2022-07-06 DIAGNOSIS — E119 Type 2 diabetes mellitus without complications: Secondary | ICD-10-CM

## 2022-07-06 DIAGNOSIS — E782 Mixed hyperlipidemia: Secondary | ICD-10-CM

## 2022-07-06 DIAGNOSIS — E559 Vitamin D deficiency, unspecified: Secondary | ICD-10-CM | POA: Diagnosis not present

## 2022-07-06 DIAGNOSIS — I10 Essential (primary) hypertension: Secondary | ICD-10-CM | POA: Diagnosis not present

## 2022-07-06 LAB — POCT GLYCOSYLATED HEMOGLOBIN (HGB A1C): Hemoglobin A1C: 6.9 % — AB (ref 4.0–5.6)

## 2022-07-06 NOTE — Progress Notes (Signed)
07/06/2022, 10:50 AM   Endocrinology Follow Up Visit    Subjective:    Patient ID: Tracey [name], female    DOB: 12-Jun-1955.  Tracey [name] is being engaged in follow-up for management of currently uncontrolled symptomatic type 2 diabetes, hyperlipidemia, hypertension.    PMD:   Fayrene Helper, MD.  Past Medical History:  Diagnosis Date   Arthritis    Diabetes mellitus without complication ([place])    Diastolic dysfunction [id number]   Grade 1   GERD (gastroesophageal reflux disease)    Hyperlipidemia    Hyperlipidemia    Hypertension 04/15/2012   Morbid obesity ([place])    PE (pulmonary embolism)    Pulmonary emboli ([place]) 1999   post TAH   Sleep apnea    Past Surgical History:  Procedure Laterality Date   ABDOMINAL HYSTERECTOMY  1999   , totalfor fibroids   CESAREAN SECTION     CHOLECYSTECTOMY  1996   COLONOSCOPY N/A 10/31/2015   Procedure: COLONOSCOPY;  Surgeon: Rogene Houston, MD;  Location: [facility];  Service: Endoscopy;  Laterality: N/A;  930   FOOT SURGERY Right    October 2022   Social History   Socioeconomic History   Marital status: Single    Spouse name: Not on file   Number of children: Not on file   Years of education: Not on file   Highest education level: Not on file  Occupational History   Not on file  Tobacco Use   Smoking status: Never   Smokeless tobacco: Never  Vaping Use   Vaping Use: Never used  Substance and Sexual Activity   Alcohol use: No   Drug use: No   Sexual activity: Not Currently  Other Topics Concern   Not on file  Social History Narrative   Not on file   Social Determinants of Health   Financial Resource Strain: Medium Risk (06/15/2022)   Overall Financial Resource Strain (CARDIA)    Difficulty of Paying Living Expenses: Somewhat hard  Food Insecurity: Food Insecurity Present (06/15/2022)   Hunger  Vital Sign    Worried About Running Out of Food in the Last Year: Sometimes true    Ran Out of Food in the Last Year: Sometimes true  Transportation Needs: No Transportation Needs (06/16/2022)   PRAPARE - Hydrologist (Medical): No    Lack of Transportation (Non-Medical): No  Physical Activity: Inactive (06/15/2022)   Exercise Vital Sign    Days of Exercise per Week: 0 days    Minutes of Exercise per Session: 0 min  Stress: No Stress Concern Present (06/15/2022)   [place]    Feeling of Stress : Only a little  Social Connections: Socially Isolated (06/15/2022)   Social Connection and Isolation Panel [NHANES]    Frequency of Communication with Friends and Family: More than three times a week    Frequency  of Social Gatherings with Friends and Family: Three times a week    Attends Religious Services: Never    Active Member of Clubs or Organizations: No    Attends Archivist Meetings: Never    Marital Status: Divorced   Outpatient Encounter Medications as of 07/06/2022  Medication Sig   acetaminophen (TYLENOL) 500 MG tablet Take 500 mg by mouth every 6 (six) hours as needed for moderate pain.   aspirin EC 81 MG tablet Take 1 tablet (81 mg total) by mouth daily.   blood glucose meter kit and supplies KIT Dispense based on patient and insurance preference. Use up to four times daily as directed.   glucose blood (ACCU-CHEK GUIDE) test strip USE 1 STRIP TO CHECK GLUCOSE 3 TIMES DAILY AS DIRECTED   insulin regular human CONCENTRATED (HUMULIN R U-500 KWIKPEN) 500 UNIT/ML KwikPen Inject 25 Units into the skin 3 (three) times daily with meals. INJECT 35 UNITS SUBCUTANEOUSLY INTO THE SKIN 3 TIMES DAILY WITH MEALS.   Lancets MISC Use to test BG qid. Dx : E11.65. Softclix Lancets   metFORMIN (GLUCOPHAGE) 1000 MG tablet TAKE ONE TABLET BY MOUTH TWICE A DAY WITH A MEAL   metoprolol tartrate (LOPRESSOR) 50  MG tablet TAKE ONE TABLET (50MG  TOTAL) BY MOUTH TWO TIMES DAILY   mupirocin ointment (BACTROBAN) 2 % Apply to affected area once daily.   olmesartan (BENICAR) 20 MG tablet TAKE ONE TABLET (20MG  TOTAL) BY MOUTH DAILY   omeprazole (PRILOSEC) 20 MG capsule TAKE ONE CAPSULE BY MOUTH DAILY   OZEMPIC, 1 MG/DOSE, 4 MG/3ML SOPN INJECT 1MG  ONCE A WEEK AS DIRECTED   rosuvastatin (CRESTOR) 20 MG tablet TAKE ONE TABLET (20MG  TOTAL) BY MOUTH DAILY   spironolactone (ALDACTONE) 25 MG tablet TAKE ONE (1) TABLET BY MOUTH EVERY DAY   UNIFINE PENTIPS 31G X 8 MM MISC USE FOR INSULIN INJECTIONS THREE TIMES DAILY.   Cholecalciferol (VITAMIN D-3) 125 MCG (5000 UT) TABS Take 5,000 Units by mouth daily. (Patient not taking: Reported on 02/10/2022)   Continuous Blood Gluc Sensor (FREESTYLE LIBRE 2 SENSOR) MISC CHANGE SENSOR EVERY 14 DAYS (Patient not taking: Reported on 07/06/2022)   No facility-administered encounter medications on file as of 07/06/2022.    ALLERGIES: Allergies  Allergen Reactions   Haloperidol Lactate Anaphylaxis   Lipitor [Atorvastatin Calcium] Other (See Comments)    Markedly elevated liver enzymes    VACCINATION STATUS: Immunization History  Administered Date(s) Administered   Influenza,inj,Quad PF,6+ Mos 04/01/2015, 03/16/2016, 01/20/2017, 04/11/2018, 02/13/2019, 01/31/2020, 12/05/2020   Moderna Sars-Covid-2 Vaccination 12/13/2019, 01/11/2020   PNEUMOCOCCAL CONJUGATE-20 12/05/2020   Pneumococcal Polysaccharide-23 05/13/2015    Diabetes She presents for her follow-up diabetic visit. She has type 2 diabetes mellitus. Onset time: He was diagnosed at approximate age of 89 years. Her disease course has been stable. There are no hypoglycemic associated symptoms. Pertinent negatives for hypoglycemia include no confusion, headaches, pallor or seizures. Associated symptoms include fatigue. Pertinent negatives for diabetes include no blurred vision, no chest pain, no polydipsia, no polyphagia and no  polyuria. There are no hypoglycemic complications. Symptoms are stable. Diabetic complications include heart disease and nephropathy. Risk factors for coronary artery disease include diabetes mellitus, dyslipidemia, family history, hypertension, obesity, post-menopausal and sedentary lifestyle. Current diabetic treatment includes intensive insulin program and oral agent (monotherapy) (and Ozempic). She is compliant with treatment most of the time. Her weight is decreasing steadily. She is following a generally healthy diet. When asked about meal planning, she reported none. She has had a  previous visit with a dietitian. She never participates in exercise. Her home blood glucose trend is fluctuating minimally. Her overall blood glucose range is 140-180 mg/dl. (She presents today with her meter, no logs, no CGM showing inconsistent glucose monitoring.  She notes she has not been using her CGM as the company sent her the wrong sensors for her 14-day Astronomer, plus she wanted to use up her abundant strip supply.  Her POCT A1c today is 6.9%, unchanged from previous visit.  Analysis of her meter shows 7-day average of 163 with 6 readings; 14-day average of 157 with 12 readings; 30-day average of 154 with 22 readings; 90-day average of 140 with 69 readings.) An ACE inhibitor/angiotensin II receptor blocker is not being taken. She does not see a podiatrist.Eye exam is not current.  Hyperlipidemia This is a chronic problem. The current episode started more than 1 year ago. The problem is uncontrolled. Recent lipid tests were reviewed and are high. Exacerbating diseases include chronic renal disease, diabetes and obesity. Factors aggravating her hyperlipidemia include fatty foods. Pertinent negatives include no chest pain, myalgias or shortness of breath. Current antihyperlipidemic treatment includes statins. The current treatment provides moderate improvement of lipids. Compliance problems include adherence to diet  and adherence to exercise.  Risk factors for coronary artery disease include diabetes mellitus, dyslipidemia, hypertension, obesity, a sedentary lifestyle and post-menopausal.  Hypertension This is a chronic problem. The current episode started more than 1 year ago. The problem is unchanged. The problem is uncontrolled. Pertinent negatives include no blurred vision, chest pain, headaches, palpitations or shortness of breath. There are no associated agents to hypertension. Risk factors for coronary artery disease include dyslipidemia, diabetes mellitus, obesity, sedentary lifestyle and post-menopausal state. Past treatments include beta blockers and diuretics. The current treatment provides moderate improvement. Compliance problems include exercise and diet.  Hypertensive end-organ damage includes kidney disease. Identifiable causes of hypertension include chronic renal disease.   Review of systems  Constitutional: + steadily decreasing body weight,  current Body mass index is 51.11 kg/m. , + intermittent fatigue, no subjective hyperthermia, no subjective hypothermia Eyes: no blurry vision, no xerophthalmia ENT: no sore throat, no nodules palpated in throat, no dysphagia/odynophagia, no hoarseness Cardiovascular: no chest pain, no shortness of breath, no palpitations, no leg swelling Respiratory: no cough, no shortness of breath Gastrointestinal: no nausea/vomiting/diarrhea Musculoskeletal: no muscle/joint aches Skin: no rashes, no hyperemia Neurological: no tremors, no numbness, no tingling, no dizziness Psychiatric: no depression, no anxiety    Objective:    BP 124/82 (BP Location: Left Arm, Patient Position: Sitting, Cuff Size: Large)   Pulse (!) 106   Ht 5\' 10"  (1.778 m)   Wt (!) 356 lb 3.2 oz (161.6 kg)   BMI 51.11 kg/m   Wt Readings from Last 3 Encounters:  07/06/22 (!) 356 lb 3.2 oz (161.6 kg)  12/31/21 (!) 367 lb 6.4 oz (166.7 kg)  09/15/21 (!) 368 lb (166.9 kg)     BP  Readings from Last 3 Encounters:  07/06/22 124/82  12/31/21 116/76  09/15/21 (!) 148/85     Physical Exam- Limited  Constitutional:  Body mass index is 51.11 kg/m. , not in acute distress, normal state of mind Eyes:  EOMI, no exophthalmos Musculoskeletal: no gross deformities, strength intact in all four extremities, no gross restriction of joint movements Skin:  no rashes, no hyperemia Neurological: no tremor with outstretched hands    CMP     Component Value Date/Time   NA 140 06/24/2021 1616  K 4.2 06/24/2021 1616   CL 100 06/24/2021 1616   CO2 23 06/24/2021 1616   GLUCOSE 138 (H) 06/24/2021 1616   GLUCOSE 148 (H) 12/21/2019 1252   BUN 8 06/24/2021 1616   CREATININE 0.72 06/24/2021 1616   CREATININE 0.71 12/21/2019 1252   CALCIUM 9.8 06/24/2021 1616   PROT 7.9 06/24/2021 1616   ALBUMIN 4.6 06/24/2021 1616   AST 23 06/24/2021 1616   ALT 21 06/24/2021 1616   ALKPHOS 65 06/24/2021 1616   BILITOT 0.5 06/24/2021 1616   GFRNONAA 77 02/13/2019 1207   GFRAA 90 02/13/2019 1207    Diabetic Labs (most recent): Lab Results  Component Value Date   HGBA1C 6.9 (A) 07/06/2022   HGBA1C 6.9 (A) 12/31/2021   HGBA1C 6.4 09/15/2021   MICROALBUR 30 05/22/2021   MICROALBUR 7.9 12/21/2019   MICROALBUR 63.8 (H) 04/12/2018    Lipid Panel     Component Value Date/Time   CHOL 153 06/24/2021 1616   TRIG 146 06/24/2021 1616   HDL 41 06/24/2021 1616   CHOLHDL 3.7 06/24/2021 1616   CHOLHDL 3.5 02/01/2020 1415   VLDL 23 07/13/2016 1430   LDLCALC 86 06/24/2021 1616   LDLCALC 75 02/01/2020 1415      Lab Results  Component Value Date   TSH 0.955 06/24/2021   TSH 0.727 12/05/2020   TSH 0.69 02/13/2019   TSH 1.23 05/09/2018   TSH 0.95 07/13/2016   TSH 1.26 07/10/2015   TSH 1.382 11/17/2013   TSH 0.519 04/14/2012   TSH 1.525 11/07/2009      Lipid Panel     Component Value Date/Time   CHOL 153 06/24/2021 1616   TRIG 146 06/24/2021 1616   HDL 41 06/24/2021 1616    CHOLHDL 3.7 06/24/2021 1616   CHOLHDL 3.5 02/01/2020 1415   VLDL 23 07/13/2016 1430   LDLCALC 86 06/24/2021 1616   LDLCALC 75 02/01/2020 1415     Assessment & Plan:   1) Type 2 Diabetes without complication, with long-term current use of insulin  - Tracey [name] has currently uncontrolled symptomatic type 2 DM since 67 years of age.  She presents today with her meter, no logs, no CGM showing inconsistent glucose monitoring.  She notes she has not been using her CGM as the company sent her the wrong sensors for her 14-day Astronomer, plus she wanted to use up her abundant strip supply.  Her POCT A1c today is 6.9%, unchanged from previous visit.  Analysis of her meter shows 7-day average of 163 with 6 readings; 14-day average of 157 with 12 readings; 30-day average of 154 with 22 readings; 90-day average of 140 with 69 readings.  -her diabetes is complicated by obesity/sedentary life and Tracey [name] remains at a high risk for more acute and chronic complications which include CAD, CVA, CKD, retinopathy, and neuropathy. These are all discussed in detail with the patient.  - Nutritional counseling repeated at each appointment due to patients tendency to fall back in to old habits.  - The patient admits there is a room for improvement in their diet and drink choices. -  Suggestion is made for the patient to avoid simple carbohydrates from their diet including Cakes, Sweet Desserts / Pastries, Ice Cream, Soda (diet and regular), Sweet Tea, Candies, Chips, Cookies, Sweet Pastries, Store Bought Juices, Alcohol in Excess of 1-2 drinks a day, Artificial Sweeteners, Coffee Creamer, and "Sugar-free" Products. This will help patient to have stable blood glucose profile and potentially avoid unintended  weight gain.   - I encouraged the patient to switch to unprocessed or minimally processed complex starch and increased protein intake (animal or plant source), fruits, and vegetables.   -  Patient is advised to stick to a routine mealtimes to eat 3 meals a day and avoid unnecessary snacks (to snack only to correct hypoglycemia).  - I have approached her with the following individualized plan to manage diabetes and patient agrees:   -She is advised to continue U500 25 units TID with meals if glucose is above 90 and she is eating and increase her Ozempic to 1 mg SQ weekly, and Metformin 1000 mg po twice daily with meals for now.   -Encouraged patient to monitor blood glucose 4 times per day (using her CGM), before meals and at bedtime and notify the clinic if blood glucose levels are less than 70 or greater than 300 for 3 tests in a row.  She has benefited greatly from CGM device- is advised to restart using it.  I also encouraged her to reach out to [employer] for upgrading possibilities.  -She is also advised not to inject insulin without proper glucose monitoring.  2) BP/HTN:  Her blood pressure is controlled to target.  She typically waits to take her Spironolactone 25 mg until she gets home so she won't have to urinate as often.  She is advised to continue Metoprolol 50 mg po twice daily, Olmesartan 20 mg po daily, and continue Spironolactone 25 mg po daily.    3) Lipids/HPL:  Her recent lipid panel from 06/24/21 shows controlled LDL of 86.  She is advised to continue Crestor 20 mg po daily at bedtime.  Side effects and precautions discussed with her.  She has annual exam with her PCP coming up with labs.  4) Vitamin D Deficiency: Her recent vitamin D level was 86.  She has completed replenishment with ergocalciferol and is now taking OTC Vitamin D3 5000 units daily as maintenance dose.  She is advised to continue for now.    5) Chronic Care/Health Maintenance: -she is on Statin medications and is encouraged to continue to follow up with Ophthalmology, Dentist, Podiatrist at least yearly or according to recommendations, and advised to stay away from smoking. I have recommended  yearly flu vaccine and pneumonia vaccination at least every 5 years; moderate intensity exercise for up to 150 minutes weekly; and  sleep for at least 7 hours a day.  - I advised patient to maintain close follow up with Fayrene Helper, MD for primary care needs.     I spent  30  minutes in the care of the patient today including review of labs from [facility], Lipids, Thyroid Function, Hematology (current and previous including abstractions from other facilities); face-to-face time discussing  her blood glucose readings/logs, discussing hypoglycemia and hyperglycemia episodes and symptoms, medications doses, her options of short and long term treatment based on the latest standards of care / guidelines;  discussion about incorporating lifestyle medicine;  and documenting the encounter. Risk reduction counseling performed per USPSTF guidelines to reduce obesity and cardiovascular risk factors.     Please refer to Patient Instructions for Blood Glucose Monitoring and Insulin/Medications Dosing Guide"  in media tab for additional information. Please  also refer to " Patient Self Inventory" in the Media  tab for reviewed elements of pertinent patient history.  Duane Boston participated in the discussions, expressed understanding, and voiced agreement with the above plans.  All questions were answered to her  satisfaction. she is encouraged to contact clinic should she have any questions or concerns prior to her return visit.   Follow up plan: - Return in about 4 months (around 11/05/2022) for Diabetes F/U with A1c in office, No previsit labs, Bring meter and logs.   Tracey [name], [facility] [address], [place] [address] Phone: [phone] Fax: [phone]  07/06/2022, 10:50 AM

## 2022-07-13 ENCOUNTER — Ambulatory Visit: Payer: Self-pay | Admitting: *Deleted

## 2022-07-13 NOTE — Patient Outreach (Signed)
Care Coordination   Follow Up Visit Note   07/13/2022 Name: Tracey [name] MRN: [id number] DOB: 1956/04/10  Tracey [name] is a 67 y.o. year old female who sees Tracey [name], Tracey Levo, MD for primary care. I spoke with  Tracey [name] by phone today.  What matters to the patients health and wellness today?  Try to improve my self care.    Goals Addressed             This Visit's Progress    Exercise 3x per week (30 min per time)   On track    Interventions Today    Flowsheet Row Most Recent Value  Chronic Disease   Chronic disease during today's visit Diabetes, Hypertension (HTN)  General Interventions   General Interventions Discussed/Reviewed General Interventions Discussed, General Interventions Reviewed, Annual Eye Exam, Annual Foot Exam  [Has not made eye appt yet. Provided name, phone and address of a provider. Has received her BP cuff provided by [facility] but has not started using it. Encouraged her to do so and to record. Cont reducing carbs.]  Exercise Interventions   Exercise Discussed/Reviewed Exercise Discussed, Exercise Reviewed, Physical Activity  [Reminded pt of her goal to do chair exercises 30+ minutes 3 times a week.]  Physical Activity Discussed/Reviewed Physical Activity Reviewed, Types of exercise, Home Exercise Program (HEP)  [She is doing some chair exercises.]  Education Interventions   Education Provided Provided Education  Provided Verbal Education On Eye Care, Blood Sugar Monitoring, Exercise, Nutrition  Nutrition Interventions   Nutrition Discussed/Reviewed Nutrition Reviewed, Carbohydrate meal planning, Portion sizes, Decreasing salt, Decreasing sugar intake  [Suggest pt eat until she is full and put the rest of food away for another meal, not for an hour later when she feels less full. Need to follow a meal schedule and stick to it. Avoid grazing all day.]  Safety Interventions   Safety Discussed/Reviewed Safety Discussed, Safety Reviewed, Fall  Risk, Home Safety  [Pt encouraged to move her bedroom to downstairs living room. Request that she avoid crawling up and down the steps this is not safe and will inevitally lead to a fall which could cause serious harm and inability to be independent.]           I will keep my HgbA1C <7.0 over the next year. Last was 6.9 on 12/31/21.   On track    Interventions Today    Flowsheet Row Most Recent Value  Chronic Disease   Chronic disease during today's visit Diabetes, Hypertension (HTN)  General Interventions   General Interventions Discussed/Reviewed General Interventions Discussed, General Interventions Reviewed, Annual Eye Exam, Annual Foot Exam  [Has not made eye appt yet. Provided name, phone and address of a provider. Has received her BP cuff provided by [facility] but has not started using it. Encouraged her to do so and to record. Cont reducing carbs.]  Exercise Interventions   Exercise Discussed/Reviewed Exercise Discussed, Exercise Reviewed, Physical Activity  [Reminded pt of her goal to do chair exercises 30+ minutes 3 times a week.]  Physical Activity Discussed/Reviewed Physical Activity Reviewed, Types of exercise, Home Exercise Program (HEP)  [She is doing some chair exercises.]  Education Interventions   Education Provided Provided Education  Provided Verbal Education On Eye Care, Blood Sugar Monitoring, Exercise, Nutrition  Nutrition Interventions   Nutrition Discussed/Reviewed Nutrition Reviewed, Carbohydrate meal planning, Portion sizes, Decreasing salt, Decreasing sugar intake  [Suggest pt eat until she is full and put the rest of food away for another meal,  not for an hour later when she feels less full. Need to follow a meal schedule and stick to it. Avoid grazing all day.]  Safety Interventions   Safety Discussed/Reviewed Safety Discussed, Safety Reviewed, Fall Risk, Home Safety  [Pt encouraged to move her bedroom to downstairs living room. Request that she avoid crawling up and  down the steps this is not safe and will inevitally lead to a fall which could cause serious harm and inability to be independent.]           I will schedule an eye exam during the next 30 days.   Not on track    Interventions Today    Flowsheet Row Most Recent Value  Chronic Disease   Chronic disease during today's visit Diabetes, Hypertension (HTN)  General Interventions   General Interventions Discussed/Reviewed General Interventions Discussed, General Interventions Reviewed, Annual Eye Exam, Annual Foot Exam  [Has not made eye appt yet. Provided name, phone and address of a provider. Has received her BP cuff provided by [facility] but has not started using it. Encouraged her to do so and to record. Cont reducing carbs.]  Exercise Interventions   Exercise Discussed/Reviewed Exercise Discussed, Exercise Reviewed, Physical Activity  [Reminded pt of her goal to do chair exercises 30+ minutes 3 times a week.]  Physical Activity Discussed/Reviewed Physical Activity Reviewed, Types of exercise, Home Exercise Program (HEP)  [She is doing some chair exercises.]  Education Interventions   Education Provided Provided Education  Provided Verbal Education On Eye Care, Blood Sugar Monitoring, Exercise, Nutrition  Nutrition Interventions   Nutrition Discussed/Reviewed Nutrition Reviewed, Carbohydrate meal planning, Portion sizes, Decreasing salt, Decreasing sugar intake  [Suggest pt eat until she is full and put the rest of food away for another meal, not for an hour later when she feels less full. Need to follow a meal schedule and stick to it. Avoid grazing all day.]  Safety Interventions   Safety Discussed/Reviewed Safety Discussed, Safety Reviewed, Fall Risk, Home Safety  [Pt encouraged to move her bedroom to downstairs living room. Request that she avoid crawling up and down the steps this is not safe and will inevitally lead to a fall which could cause serious harm and inability to be independent.]                Increase physical activity       Interventions Today    Flowsheet Row Most Recent Value  Chronic Disease   Chronic disease during today's visit Diabetes, Hypertension (HTN)  General Interventions   General Interventions Discussed/Reviewed General Interventions Discussed, General Interventions Reviewed, Annual Eye Exam, Annual Foot Exam  [Has not made eye appt yet. Provided name, phone and address of a provider. Has received her BP cuff provided by [facility] but has not started using it. Encouraged her to do so and to record. Cont reducing carbs.]  Exercise Interventions   Exercise Discussed/Reviewed Exercise Discussed, Exercise Reviewed, Physical Activity  [Reminded pt of her goal to do chair exercises 30+ minutes 3 times a week.]  Physical Activity Discussed/Reviewed Physical Activity Reviewed, Types of exercise, Home Exercise Program (HEP)  [She is doing some chair exercises.]  Education Interventions   Education Provided Provided Education  Provided Verbal Education On Eye Care, Blood Sugar Monitoring, Exercise, Nutrition  Nutrition Interventions   Nutrition Discussed/Reviewed Nutrition Reviewed, Carbohydrate meal planning, Portion sizes, Decreasing salt, Decreasing sugar intake  [Suggest pt eat until she is full and put the rest of food away for another meal, not for  an hour later when she feels less full. Need to follow a meal schedule and stick to it. Avoid grazing all day.]  Safety Interventions   Safety Discussed/Reviewed Safety Discussed, Safety Reviewed, Fall Risk, Home Safety  [Pt encouraged to move her bedroom to downstairs living room. Request that she avoid crawling up and down the steps this is not safe and will inevitally lead to a fall which could cause serious harm and inability to be independent.]                SDOH assessments and interventions completed:  Yes    Previously addressed.  Care Coordination Interventions:  Yes, provided   Follow up  plan: Follow up call scheduled for 3 months.  Advised pt that new RN Care Manager will call her for this appt.  Encounter Outcome:  Pt. Visit Completed   Kayleen Memos C. Myrtie Neither, MSN, [facility] Gerontological Nurse Practitioner [facility] Care Management [phone]

## 2022-07-15 ENCOUNTER — Ambulatory Visit (INDEPENDENT_AMBULATORY_CARE_PROVIDER_SITE_OTHER): Payer: Medicare Other | Admitting: Family Medicine

## 2022-07-15 ENCOUNTER — Encounter: Payer: Self-pay | Admitting: Family Medicine

## 2022-07-15 VITALS — BP 110/80 | HR 123 | Ht 70.0 in | Wt 351.0 lb

## 2022-07-15 DIAGNOSIS — Z780 Asymptomatic menopausal state: Secondary | ICD-10-CM

## 2022-07-15 DIAGNOSIS — I5189 Other ill-defined heart diseases: Secondary | ICD-10-CM | POA: Diagnosis not present

## 2022-07-15 DIAGNOSIS — E1169 Type 2 diabetes mellitus with other specified complication: Secondary | ICD-10-CM

## 2022-07-15 DIAGNOSIS — Z0279 Encounter for issue of other medical certificate: Secondary | ICD-10-CM

## 2022-07-15 DIAGNOSIS — I10 Essential (primary) hypertension: Secondary | ICD-10-CM

## 2022-07-15 DIAGNOSIS — R7301 Impaired fasting glucose: Secondary | ICD-10-CM

## 2022-07-15 DIAGNOSIS — E782 Mixed hyperlipidemia: Secondary | ICD-10-CM | POA: Diagnosis not present

## 2022-07-15 DIAGNOSIS — E559 Vitamin D deficiency, unspecified: Secondary | ICD-10-CM

## 2022-07-15 DIAGNOSIS — E669 Obesity, unspecified: Secondary | ICD-10-CM | POA: Diagnosis not present

## 2022-07-15 DIAGNOSIS — K219 Gastro-esophageal reflux disease without esophagitis: Secondary | ICD-10-CM | POA: Diagnosis not present

## 2022-07-15 NOTE — Patient Instructions (Addendum)
F/Un in mid October , call if you need me sooner  Labs today, cBC, lipid, cmp and eGFr, hBA1C, TSH, vit D and urine ACR  Keep up great work of improving blood sugar and losing weight  You are referred for bone density, pls schedula at checkout  Form completed and returned  Y0u need several vaccines , and all are available at the Pharmacy , Shingrix, Covid, TdAP  Best for Spring and Summer!  Thanks for choosing [facility], we consider it a privelige to serve you.

## 2022-07-17 ENCOUNTER — Other Ambulatory Visit (HOSPITAL_COMMUNITY): Payer: Medicare Other

## 2022-07-17 LAB — CMP14+EGFR
ALT: 38 IU/L — ABNORMAL HIGH (ref 0–32)
AST: 34 IU/L (ref 0–40)
Albumin/Globulin Ratio: 1.2 (ref 1.2–2.2)
Albumin: 4.1 g/dL (ref 3.9–4.9)
Alkaline Phosphatase: 66 IU/L (ref 44–121)
BUN/Creatinine Ratio: 8 — ABNORMAL LOW (ref 12–28)
BUN: 7 mg/dL — ABNORMAL LOW (ref 8–27)
Bilirubin Total: 0.4 mg/dL (ref 0.0–1.2)
CO2: 21 mmol/L (ref 20–29)
Calcium: 9.2 mg/dL (ref 8.7–10.3)
Chloride: 102 mmol/L (ref 96–106)
Creatinine, Ser: 0.83 mg/dL (ref 0.57–1.00)
Globulin, Total: 3.4 g/dL (ref 1.5–4.5)
Glucose: 149 mg/dL — ABNORMAL HIGH (ref 70–99)
Potassium: 4.4 mmol/L (ref 3.5–5.2)
Sodium: 140 mmol/L (ref 134–144)
Total Protein: 7.5 g/dL (ref 6.0–8.5)
eGFR: 78 mL/min/{1.73_m2} (ref >59)

## 2022-07-17 LAB — MICROALBUMIN / CREATININE URINE RATIO
Creatinine, Urine: 206.7 mg/dL
Microalb/Creat Ratio: 27 mg/g creat (ref 0–29)
Microalbumin, Urine: 56.8 ug/mL

## 2022-07-17 LAB — CBC
Hematocrit: 42.4 % (ref 34.0–46.6)
Hemoglobin: 13.4 g/dL (ref 11.1–15.9)
MCH: 24.7 pg — ABNORMAL LOW (ref 26.6–33.0)
MCHC: 31.6 g/dL (ref 31.5–35.7)
MCV: 78 fL — ABNORMAL LOW (ref 79–97)
Platelets: 319 10*3/uL (ref 150–450)
RBC: 5.42 x10E6/uL — ABNORMAL HIGH (ref 3.77–5.28)
RDW: 15.8 % — ABNORMAL HIGH (ref 11.7–15.4)
WBC: 11.1 10*3/uL — ABNORMAL HIGH (ref 3.4–10.8)

## 2022-07-17 LAB — LIPID PANEL
Chol/HDL Ratio: 3.7 ratio (ref 0.0–4.4)
Cholesterol, Total: 154 mg/dL (ref 100–199)
HDL: 42 mg/dL (ref >39)
LDL Chol Calc (NIH): 90 mg/dL (ref 0–99)
Triglycerides: 121 mg/dL (ref 0–149)
VLDL Cholesterol Cal: 22 mg/dL (ref 5–40)

## 2022-07-17 LAB — HEMOGLOBIN A1C
Est. average glucose Bld gHb Est-mCnc: 157 mg/dL
Hgb A1c MFr Bld: 7.1 % — ABNORMAL HIGH (ref 4.8–5.6)

## 2022-07-17 LAB — TSH: TSH: 0.722 u[IU]/mL (ref 0.450–4.500)

## 2022-07-17 LAB — VITAMIN D 25 HYDROXY (VIT D DEFICIENCY, FRACTURES): Vit D, 25-Hydroxy: 24.1 ng/mL — ABNORMAL LOW (ref 30.0–100.0)

## 2022-07-21 ENCOUNTER — Encounter: Payer: Self-pay | Admitting: Family Medicine

## 2022-07-21 NOTE — Progress Notes (Signed)
Tracey [name]     MRN: [id number]      DOB: 15-Apr-1955   HPI Tracey [name] is here for follow up and re-evaluation of chronic medical conditions, medication management and review of any available recent lab and radiology data.  Preventive health is updated, specifically  Cancer screening and Immunization.   Questions or concerns regarding consultations or procedures which the PT has had in the interim are  addressed. The PT denies any adverse reactions to current medications since the last visit.  There are no new concerns. Has form for completion for PCS services which is completed and sent off Denies polyuria, polydipsia, blurred vision , or hypoglycemic episodes. ROS Denies recent fever or chills. Denies sinus pressure, nasal congestion, ear pain or sore throat. Denies chest congestion, productive cough or wheezing. Denies chest pains, palpitations and leg swelling Denies abdominal pain, nausea, vomiting,diarrhea or constipation.   Denies dysuria, frequency, hesitancy or incontinence. Chronic  joint pain, swelling and limitation in mobility. Denies headaches, seizures, numbness, or tingling. Denies depression, anxiety or insomnia. Denies skin break down or rash.   PE  BP 110/80   Pulse (!) 123   Ht 5\' 10"  (1.778 m)   Wt (!) 351 lb 0.6 oz (159.2 kg)   SpO2 95%   BMI 50.37 kg/m   Patient alert and oriented and in no cardiopulmonary distress.  HEENT: No facial asymmetry, EOMI,     Neck supple .  Chest: Clear to auscultation bilaterally.  CVS: S1, S2 no murmurs, no S3.Regular rate.  ABD: Soft non tender.   Ext: No edema  MS: decreased  ROM spine, shoulders, hips and knees.  Skin: Intact, no ulcerations or rash noted.  Psych: Good eye contact, normal affect. Memory intact not anxious or depressed appearing.  CNS: CN 2-12 intact, power,  normal throughout.no focal deficits noted.   Assessment & Plan  Essential hypertension, benign Controlled, no change in  medication DASH diet and commitment to daily physical activity for a minimum of 30 minutes discussed and encouraged, as a part of hypertension management. The importance of attaining a healthy weight is also discussed.     07/15/2022    2:07 PM 07/15/2022    1:58 PM 07/15/2022    1:29 PM 07/15/2022    1:27 PM 07/06/2022   10:27 AM 12/31/2021    8:50 AM 09/15/2021    2:11 PM  BP/Weight  Systolic BP 110 120 162 166 124 116 148  Diastolic BP 80 84 78 74 82 76 85  Wt. (Lbs)    351.04 356.2 367.4 368  BMI    50.37 kg/m2 51.11 kg/m2 52.72 kg/m2 52.8 kg/m2       Type 2 diabetes mellitus with obesity Tracey [name] is reminded of the importance of commitment to daily physical activity for 30 minutes or more, as able and the need to limit carbohydrate intake to 30 to 60 grams per meal to help with blood sugar control.   The need to take medication as prescribed, test blood sugar as directed, and to call between visits if there is a concern that blood sugar is uncontrolled is also discussed.   Tracey [name] is reminded of the importance of daily foot exam, annual eye examination, and good blood sugar, blood pressure and cholesterol control. Adequate control, managed by endo     Latest Ref Rng & Units 07/15/2022    2:21 PM 07/06/2022   10:42 AM 12/31/2021    9:05 AM 09/15/2021  2:24 PM 06/24/2021    4:16 PM  Diabetic Labs  HbA1c 4.8 - 5.6 % 7.1  6.9  6.9  6.4    Micro/Creat Ratio 0 - 29 mg/g creat 27       Chol 100 - 199 mg/dL 409     811   HDL >91 mg/dL 42     41   Calc LDL 0 - 99 mg/dL 90     86   Triglycerides 0 - 149 mg/dL 478     295   Creatinine 0.57 - 1.00 mg/dL 6.21     3.08       09/15/7844    2:07 PM 07/15/2022    1:58 PM 07/15/2022    1:29 PM 07/15/2022    1:27 PM 07/06/2022   10:27 AM 12/31/2021    8:50 AM 09/15/2021    2:11 PM  BP/Weight  Systolic BP 110 120 162 166 124 116 148  Diastolic BP 80 84 78 74 82 76 85  Wt. (Lbs)    351.04 356.2 367.4 368  BMI    50.37 kg/m2 51.11 kg/m2 52.72  kg/m2 52.8 kg/m2      12/31/2021    9:00 AM 02/13/2019   11:00 AM  Foot/eye exam completion dates  Foot Form Completion Done Done        Morbid obesity  Patient re-educated about  the importance of commitment to a  minimum of 150 minutes of exercise per week as able.  The importance of healthy food choices with portion control discussed, as well as eating regularly and within a 12 hour window most days. The need to choose "clean , green" food 50 to 75% of the time is discussed, as well as to make water the primary drink and set a goal of 64 ounces water daily.       07/15/2022    1:27 PM 07/06/2022   10:27 AM 12/31/2021    8:50 AM  Weight /BMI  Weight 351 lb 0.6 oz 356 lb 3.2 oz 367 lb 6.4 oz  Height  (1.778 m)  (1.778 m)  (1.778 m)  BMI 50.37 kg/m2 51.11 kg/m2 52.72 kg/m2    Improving, advised to keep this up  Mixed hyperlipidemia Hyperlipidemia:Low fat diet discussed and encouraged.   Lipid Panel  Lab Results  Component Value Date   CHOL 154 07/15/2022   HDL 42 07/15/2022   LDLCALC 90 07/15/2022   TRIG 121 07/15/2022   CHOLHDL 3.7 07/15/2022     Controlled, no change in medication   GERD Controlled, no change in medication   Diastolic dysfunction Stable, no s/s of decompensation

## 2022-07-23 ENCOUNTER — Other Ambulatory Visit (HOSPITAL_COMMUNITY): Payer: Medicare Other

## 2022-07-24 ENCOUNTER — Ambulatory Visit: Payer: Medicare Other | Admitting: Podiatry

## 2022-07-26 ENCOUNTER — Encounter: Payer: Self-pay | Admitting: Family Medicine

## 2022-07-26 NOTE — Assessment & Plan Note (Signed)
Controlled, no change in medication DASH diet and commitment to daily physical activity for a minimum of 30 minutes discussed and encouraged, as a part of hypertension management. The importance of attaining a healthy weight is also discussed.     07/15/2022    2:07 PM 07/15/2022    1:58 PM 07/15/2022    1:29 PM 07/15/2022    1:27 PM 07/06/2022   10:27 AM 12/31/2021    8:50 AM 09/15/2021    2:11 PM  BP/Weight  Systolic BP 110 120 162 166 124 116 148  Diastolic BP 80 84 78 74 82 76 85  Wt. (Lbs)    351.04 356.2 367.4 368  BMI    50.37 kg/m2 51.11 kg/m2 52.72 kg/m2 52.8 kg/m2

## 2022-07-26 NOTE — Assessment & Plan Note (Signed)
Hyperlipidemia:Low fat diet discussed and encouraged.   Lipid Panel  Lab Results  Component Value Date   CHOL 154 07/15/2022   HDL 42 07/15/2022   LDLCALC 90 07/15/2022   TRIG 121 07/15/2022   CHOLHDL 3.7 07/15/2022     Controlled, no change in medication

## 2022-07-26 NOTE — Assessment & Plan Note (Signed)
Tracey [name] is reminded of the importance of commitment to daily physical activity for 30 minutes or more, as able and the need to limit carbohydrate intake to 30 to 60 grams per meal to help with blood sugar control.   The need to take medication as prescribed, test blood sugar as directed, and to call between visits if there is a concern that blood sugar is uncontrolled is also discussed.   Tracey [name] is reminded of the importance of daily foot exam, annual eye examination, and good blood sugar, blood pressure and cholesterol control. Adequate control, managed by endo     Latest Ref Rng & Units 07/15/2022    2:21 PM 07/06/2022   10:42 AM 12/31/2021    9:05 AM 09/15/2021    2:24 PM 06/24/2021    4:16 PM  Diabetic Labs  HbA1c 4.8 - 5.6 % 7.1  6.9  6.9  6.4    Micro/Creat Ratio 0 - 29 mg/g creat 27       Chol 100 - 199 mg/dL 546     568   HDL >12 mg/dL 42     41   Calc LDL 0 - 99 mg/dL 90     86   Triglycerides 0 - 149 mg/dL 751     700   Creatinine 0.57 - 1.00 mg/dL 1.74     9.44       12/17/7589    2:07 PM 07/15/2022    1:58 PM 07/15/2022    1:29 PM 07/15/2022    1:27 PM 07/06/2022   10:27 AM 12/31/2021    8:50 AM 09/15/2021    2:11 PM  BP/Weight  Systolic BP 110 120 162 166 124 116 148  Diastolic BP 80 84 78 74 82 76 85  Wt. (Lbs)    351.04 356.2 367.4 368  BMI    50.37 kg/m2 51.11 kg/m2 52.72 kg/m2 52.8 kg/m2      12/31/2021    9:00 AM 02/13/2019   11:00 AM  Foot/eye exam completion dates  Foot Form Completion Done Done

## 2022-07-26 NOTE — Assessment & Plan Note (Signed)
Stable , no s/s of decompensation 

## 2022-07-26 NOTE — Assessment & Plan Note (Signed)
  Patient re-educated about  the importance of commitment to a  minimum of 150 minutes of exercise per week as able.  The importance of healthy food choices with portion control discussed, as well as eating regularly and within a 12 hour window most days. The need to choose "clean , green" food 50 to 75% of the time is discussed, as well as to make water the primary drink and set a goal of 64 ounces water daily.       07/15/2022    1:27 PM 07/06/2022   10:27 AM 12/31/2021    8:50 AM  Weight /BMI  Weight 351 lb 0.6 oz 356 lb 3.2 oz 367 lb 6.4 oz  Height 5\' 10"  (1.778 m) 5\' 10"  (1.778 m) 5\' 10"  (1.778 m)  BMI 50.37 kg/m2 51.11 kg/m2 52.72 kg/m2    Improving, advised to keep this up

## 2022-07-26 NOTE — Assessment & Plan Note (Signed)
Controlled, no change in medication  

## 2022-07-28 ENCOUNTER — Ambulatory Visit: Payer: Medicare Other | Admitting: Podiatry

## 2022-07-30 ENCOUNTER — Other Ambulatory Visit: Payer: Self-pay | Admitting: Family Medicine

## 2022-07-31 ENCOUNTER — Other Ambulatory Visit: Payer: Self-pay | Admitting: Nurse Practitioner

## 2022-08-03 NOTE — Progress Notes (Unsigned)
Cardiology Office Note    Date:  08/04/2022   ID:  Kristal, Perl 1955-09-25, MRN [id number]  PCP:  Kerri Perches, MD  Cardiologist:  Dietrich Pates, MD  Electrophysiologist:  None   Chief Complaint: f/u sinus tachycardia  History of Present Illness:   Tracey [name] is a 67 y.o. female with history of severe morbid obesity, baseline sinus tachycardia, HTN, HLD (followed by PCP), PE 1999 s/p TAH, GERD, arthritis, PSVT, pre-diabetes, OSA who presents for overdue follow-up. She has a history of chest pain previously. Dobutamine nuc in 2014 was normal. Her EKG has been nonspecifically abnormal. CT 2014 for CP/tachycardia showed no PE. She saw Dr. Tenny Craw in 2017 and was noted to be tachycardic at baseline. CBC did not show any acute anemia. 2D echo 06/2015 showed EF 60-65%, grade 1 DD. 24-hour monitor 06/2015 showed NSR, rare PACs/PVCs. Reported symptoms corresponded to NSR. Average HR was 97 so Dr. Tenny Craw recommended metoprolol BID. Repeat echo 2020 showed EF >65%, indeterminate diastolic function, no significant valve disease. Repeat monitor 2022 showed HR 50-150bpm, NSR, short bursts SVT longest 11 sec, rare PACs, PVCs. She was last seen 11/2020.   She is seen for follow-up reporting she is doing well from a cardiac standpoint. She denies any change in chronic dyspnea or recent cardiac complaints. No CP or palpitations. Main problem is her knees. She is on Ozempic and has lost about 50lb over the last year, continues to follow closely with PCP for this. She was unsure what the outcome was of her 2018 sleep study, thought she was told it was not bad enough to warrant therapy. Per chart review the scans indicate she had moderate OSA with plan for titration but was lost to follow-up.   Labwork independently reviewed: 07/2022 TSH wnl, A1c 7.1, K 4.4, Cr 0.83, AST ALT OK, LDL 90, Hgb 13.4, Plt 319, WBC 11.2 (baseline 10-11)  Past History   Past Medical History:  Diagnosis Date    Arthritis    Diabetes mellitus without complication    Diastolic dysfunction 10/2009   Grade 1   GERD (gastroesophageal reflux disease)    Hyperlipidemia    Hyperlipidemia    Hypertension 04/15/2012   Morbid obesity    PE (pulmonary embolism)    Pulmonary emboli 1999   post TAH   Sleep apnea     Past Surgical History:  Procedure Laterality Date   ABDOMINAL HYSTERECTOMY  1999   , totalfor fibroids   CESAREAN SECTION     CHOLECYSTECTOMY  1996   COLONOSCOPY N/A 10/31/2015   Procedure: COLONOSCOPY;  Surgeon: Malissa Hippo, MD;  Location: [facility];  Service: Endoscopy;  Laterality: N/A;  930   FOOT SURGERY Right    October 2022    Current Medications: Current Meds  Medication Sig   acetaminophen (TYLENOL) 500 MG tablet Take 500 mg by mouth every 6 (six) hours as needed for moderate pain.   aspirin EC 81 MG tablet Take 1 tablet (81 mg total) by mouth daily.   blood glucose meter kit and supplies KIT Dispense based on patient and insurance preference. Use up to four times daily as directed.   Continuous Blood Gluc Sensor (FREESTYLE LIBRE 2 SENSOR) MISC CHANGE SENSOR EVERY 14 DAYS   glucose blood (ACCU-CHEK GUIDE) test strip USE 1 STRIP TO CHECK GLUCOSE 3 TIMES DAILY AS DIRECTED   insulin regular human CONCENTRATED (HUMULIN R U-500 KWIKPEN) 500 UNIT/ML KwikPen Inject 25 Units into the skin 3 (  three) times daily with meals. INJECT 35 UNITS SUBCUTANEOUSLY INTO THE SKIN 3 TIMES DAILY WITH MEALS.   Lancets MISC Use to test BG qid. Dx : E11.65. Softclix Lancets   metFORMIN (GLUCOPHAGE) 1000 MG tablet TAKE ONE TABLET BY MOUTH TWICE A DAY WITH A MEAL   metoprolol tartrate (LOPRESSOR) 50 MG tablet TAKE ONE TABLET (  TOTAL) BY MOUTH TWO TIMES DAILY   mupirocin ointment (BACTROBAN) 2 % Apply to affected area once daily.   olmesartan (BENICAR) 20 MG tablet TAKE ONE TABLET (  TOTAL) BY MOUTH DAILY   omeprazole (PRILOSEC) 20 MG capsule TAKE ONE CAPSULE BY MOUTH DAILY   rosuvastatin  (CRESTOR) 20 MG tablet TAKE ONE TABLET (  TOTAL) BY MOUTH DAILY   Semaglutide, 1 MG/DOSE, (OZEMPIC, 1 MG/DOSE,) 4 MG/3ML SOPN INJECT 1 MG SUBCUTANEOUSLY ONCE A WEEK AS  DRIECTED.   spironolactone (ALDACTONE) 25 MG tablet TAKE ONE (1) TABLET BY MOUTH EVERY DAY   UNIFINE PENTIPS 31G X 8 MM MISC USE FOR INSULIN INJECTIONS THREE TIMES DAILY.      Allergies:   Haloperidol lactate and Lipitor [atorvastatin calcium]   Social History   Socioeconomic History   Marital status: Single    Spouse name: Not on file   Number of children: Not on file   Years of education: Not on file   Highest education level: Not on file  Occupational History   Not on file  Tobacco Use   Smoking status: Never   Smokeless tobacco: Never  Vaping Use   Vaping Use: Never used  Substance and Sexual Activity   Alcohol use: No   Drug use: No   Sexual activity: Not Currently  Other Topics Concern   Not on file  Social History Narrative   Not on file   Social Determinants of Health   Financial Resource Strain: Medium Risk (06/15/2022)   Overall Financial Resource Strain (CARDIA)    Difficulty of Paying Living Expenses: Somewhat hard  Food Insecurity: Food Insecurity Present (06/15/2022)   Hunger Vital Sign    Worried About Running Out of Food in the Last Year: Sometimes true    Ran Out of Food in the Last Year: Sometimes true  Transportation Needs: No Transportation Needs (06/16/2022)   PRAPARE - Administrator, Civil Service (Medical): No    Lack of Transportation (Non-Medical): No  Physical Activity: Inactive (06/15/2022)   Exercise Vital Sign    Days of Exercise per Week: 0 days    Minutes of Exercise per Session: 0 min  Stress: No Stress Concern Present (06/15/2022)   [employer] of Occupational Health - Occupational Stress Questionnaire    Feeling of Stress : Only a little  Social Connections: Socially Isolated (06/15/2022)   Social Connection and Isolation Panel [NHANES]    Frequency of  Communication with Friends and Family: More than three times a week    Frequency of Social Gatherings with Friends and Family: Three times a week    Attends Religious Services: Never    Active Member of Clubs or Organizations: No    Attends Banker Meetings: Never    Marital Status: Divorced     Family History:  The patient's family history includes Alcohol abuse in her father; Aneurysm in her brother; Arthritis in her sister; Cirrhosis in her father; Coronary artery disease in her sister; Depression in her mother and son; Diabetes in her mother; Heart attack (age of onset: 4) in her sister; Heart murmur in her son; Hyperlipidemia in  her father; Hypertension in her father, mother, and son; Sleep apnea in her sister.  ROS:   Please see the history of present illness.  All other systems are reviewed and otherwise negative.    EKG(s)/Additional Testing   EKG:  EKG is ordered today, personally reviewed, demonstrating NSR 97bpm, right ward axis, nonspecific STTW changes similar to prior.  CV Studies: Cardiac studies reviewed are outlined and summarized above. Otherwise please see EMR for full report.  Recent Labs: 07/15/2022: ALT 38; BUN 7; Creatinine, Ser 0.83; Hemoglobin 13.4; Platelets 319; Potassium 4.4; Sodium 140; TSH 0.722  Recent Lipid Panel    Component Value Date/Time   CHOL 154 07/15/2022 1421   TRIG 121 07/15/2022 1421   HDL 42 07/15/2022 1421   CHOLHDL 3.7 07/15/2022 1421   CHOLHDL 3.5 02/01/2020 1415   VLDL 23 07/13/2016 1430   LDLCALC 90 07/15/2022 1421   LDLCALC 75 02/01/2020 1415    PHYSICAL EXAM:    VS:  BP 128/76   Pulse 97   Ht 5\' 10"  (1.778 m)   Wt (!) 353 lb (160.1 kg)   SpO2 99%   BMI 50.65 kg/m   BMI: Body mass index is 50.65 kg/m.  GEN: Well nourished, well developed female in no acute distress HEENT: normocephalic, atraumatic Neck: no JVD, carotid bruits, or masses Cardiac: RRR; no murmurs, rubs, or gallops, no edema  Respiratory:   clear to auscultation bilaterally, normal work of breathing GI: soft, nontender, nondistended, + BS MS: no deformity or atrophy Skin: warm and dry, no rash Neuro:  Alert and Oriented x 3, Strength and sensation are intact, follows commands Psych: euthymic mood, full affect  Wt Readings from Last 3 Encounters:  08/04/22 (!) 353 lb (160.1 kg)  07/15/22 (!) 351 lb 0.6 oz (159.2 kg)  07/06/22 (!) 356 lb 3.2 oz (161.6 kg)     ASSESSMENT & PLAN:   1. Sinus tachycardia - HR appears to be at baseline (if not slightly better than prior visits), likely driven by severe morbid obesity. Hopeful this will continue to improve as she continues to lose weight. We discussed following for low BP or HR periodically. Refill metoprolol today.  2. Chronic DOE - no accelerating symptoms, stable.   3. Morbid obesity with OSA - Dr. Gerilyn Pilgrim has since retired. Will refer to [facility] to re-establish care for sleep apnea. Likely needs updated sleep study. Congratulated her on the progress she's made with weight loss.  4. Essential HTN - controlled on present regimen. Updated labs reviewed by PCP. No change in regimen today.      Disposition: F/u with Dr. Tenny Craw in 1 year.   Medication Adjustments/Labs and Tests Ordered: Current medicines are reviewed at length with the patient today.  Concerns regarding medicines are outlined above. Medication changes, Labs and Tests ordered today are summarized above and listed in the Patient Instructions accessible in Encounters.    Signed, Laurann Montana, PA-C  08/04/2022 3:48 PM     [facility] [place] [facility] in [facility] [address] [place], [place] [address] Ph: [phone]; Fax [phone]

## 2022-08-04 ENCOUNTER — Ambulatory Visit: Payer: Medicare Other | Attending: Internal Medicine | Admitting: Physician Assistant

## 2022-08-04 ENCOUNTER — Encounter: Payer: Self-pay | Admitting: Physician Assistant

## 2022-08-04 VITALS — BP 128/76 | HR 97 | Ht 70.0 in | Wt 353.0 lb

## 2022-08-04 DIAGNOSIS — G4733 Obstructive sleep apnea (adult) (pediatric): Secondary | ICD-10-CM | POA: Insufficient documentation

## 2022-08-04 DIAGNOSIS — I10 Essential (primary) hypertension: Secondary | ICD-10-CM

## 2022-08-04 DIAGNOSIS — R0609 Other forms of dyspnea: Secondary | ICD-10-CM | POA: Diagnosis not present

## 2022-08-04 DIAGNOSIS — R000 Tachycardia, unspecified: Secondary | ICD-10-CM | POA: Insufficient documentation

## 2022-08-04 MED ORDER — METOPROLOL TARTRATE 50 MG PO TABS: ORAL_TABLET | ORAL | 3 refills | Status: DC

## 2022-08-04 NOTE — Patient Instructions (Signed)
Medication Instructions:  Your physician recommends that you continue on your current medications as directed. Please refer to the Current Medication list given to you today.   Labwork: None today  Testing/Procedures: None today  Follow-Up: 1 year Dr.Ross  Any Other Special Instructions Will Be Listed Below (If Applicable).  You have been referred to Pulmonary, they will call you to schedule appointment.   If you need a refill on your cardiac medications before your next appointment, please call your pharmacy.

## 2022-08-10 ENCOUNTER — Other Ambulatory Visit (HOSPITAL_COMMUNITY): Payer: Medicare Other

## 2022-08-10 ENCOUNTER — Other Ambulatory Visit: Payer: Self-pay | Admitting: Family Medicine

## 2022-08-12 ENCOUNTER — Telehealth: Payer: Self-pay | Admitting: *Deleted

## 2022-08-12 NOTE — Progress Notes (Signed)
  Health [employer] Plan Care Coordination Note  08/12/2022 Name: Tracey [name] MRN: [id number] DOB: 1955-10-31  Tracey [name] is a 67 y.o. year old female who is a primary care patient of Kerri Perches, MD and is actively engaged with the care management team. I reached out to Edward Jolly by phone today to assist with re-scheduling a follow up visit with the RN Case Manager  Follow up plan: Unsuccessful telephone outreach attempt made. A HIPAA compliant phone message was left for the patient providing contact information and requesting a return call.   [facility]  Care Coordination Care Guide  Direct Dial: [phone]

## 2022-08-17 NOTE — Progress Notes (Signed)
  Care Coordination Note  08/17/2022 Name: Tracey [name] MRN: [id number] DOB: 02-18-1956  Tracey [name] is a 67 y.o. year old female who is a primary care patient of Kerri Perches, MD and is actively engaged with the care management team. I reached out to Edward Jolly by phone today to assist with scheduling a follow up visit with the RN Case Manager  Follow up plan: Unsuccessful telephone outreach attempt made. A HIPAA compliant phone message was left for the patient providing contact information and requesting a return call.   [facility]  Care Coordination Care Guide  Direct Dial: [phone]

## 2022-08-18 ENCOUNTER — Other Ambulatory Visit: Payer: Self-pay | Admitting: Family Medicine

## 2022-08-18 ENCOUNTER — Ambulatory Visit (HOSPITAL_COMMUNITY)
Admission: RE | Admit: 2022-08-18 | Discharge: 2022-08-18 | Disposition: A | Discharge status: Home / Self Care | Payer: Medicare Other | Source: Ambulatory Visit | Attending: Family Medicine | Admitting: Family Medicine

## 2022-08-18 DIAGNOSIS — Z780 Asymptomatic menopausal state: Secondary | ICD-10-CM | POA: Insufficient documentation

## 2022-08-18 DIAGNOSIS — E559 Vitamin D deficiency, unspecified: Secondary | ICD-10-CM

## 2022-08-18 DIAGNOSIS — E1169 Type 2 diabetes mellitus with other specified complication: Secondary | ICD-10-CM

## 2022-08-18 DIAGNOSIS — I5189 Other ill-defined heart diseases: Secondary | ICD-10-CM

## 2022-08-18 DIAGNOSIS — I10 Essential (primary) hypertension: Secondary | ICD-10-CM

## 2022-08-18 DIAGNOSIS — E782 Mixed hyperlipidemia: Secondary | ICD-10-CM

## 2022-08-18 DIAGNOSIS — K219 Gastro-esophageal reflux disease without esophagitis: Secondary | ICD-10-CM

## 2022-08-21 NOTE — Progress Notes (Signed)
  Care Coordination Note  08/21/2022 Name: Tracey [name] MRN: [id number] DOB: 1955/06/04  Tracey [name] is a 67 y.o. year old female who is a primary care patient of Kerri Perches, MD and is actively engaged with the care management team. I reached out to Edward Jolly by phone today to assist with scheduling a follow up visit with the RN Case Manager  Follow up plan: Unsuccessful telephone outreach attempt made. A HIPAA compliant phone message was left for the patient providing contact information and requesting a return call. We have been unable to make contact with the patient for follow up. No further outreaches will be made at this time.  [facility]  Care Coordination Care Guide  Direct Dial: [phone]

## 2022-09-01 ENCOUNTER — Ambulatory Visit: Payer: Medicare Other | Admitting: Podiatry

## 2022-09-10 ENCOUNTER — Other Ambulatory Visit: Payer: Self-pay | Admitting: Nurse Practitioner

## 2022-09-16 ENCOUNTER — Ambulatory Visit (INDEPENDENT_AMBULATORY_CARE_PROVIDER_SITE_OTHER): Payer: Medicare Other | Admitting: Podiatry

## 2022-09-16 VITALS — BP 142/84

## 2022-09-16 DIAGNOSIS — M79675 Pain in left toe(s): Secondary | ICD-10-CM | POA: Diagnosis not present

## 2022-09-16 DIAGNOSIS — M79674 Pain in right toe(s): Secondary | ICD-10-CM

## 2022-09-16 DIAGNOSIS — B351 Tinea unguium: Secondary | ICD-10-CM | POA: Diagnosis not present

## 2022-09-16 DIAGNOSIS — E1142 Type 2 diabetes mellitus with diabetic polyneuropathy: Secondary | ICD-10-CM | POA: Diagnosis not present

## 2022-09-21 ENCOUNTER — Encounter: Payer: Self-pay | Admitting: Podiatry

## 2022-09-21 NOTE — Progress Notes (Signed)
  Subjective:  Patient ID: Tracey [name], female    DOB: 28-Feb-1956,  MRN: [id number]  LIELLE VANDERVORT presents to clinic today for at risk foot care with history of diabetic neuropathy and painful, discolored, thick toenails which interfere with daily activities Chief Complaint  Patient presents with   Nail Problem    DFC,A1C:7.1,LOV:04/24,Referring Provider Kerri Perches, MD,BS: no finger sticks today       New problem(s): None.   PCP is Kerri Perches, MD.  Allergies  Allergen Reactions   Haloperidol Lactate Anaphylaxis   Lipitor [Atorvastatin Calcium] Other (See Comments)    Markedly elevated liver enzymes    Review of Systems: Negative except as noted in the HPI. Objective:   Constitutional Tracey [name] is a pleasant 67 y.o. female, morbidly obese in NAD. AAO x 3.   Vascular Vascular Examination: Capillary refill time immediate b/l. Vascular status intact b/l with palpable pedal pulses. Pedal hair absent b/l. No pain with calf compression b/l. Skin temperature gradient WNL b/l. No cyanosis or clubbing b/l. Nonpitting edema noted BLE.  Neurological Examination: Protective sensation decreased with 10 gram monofilament b/l.  Dermatological Examination: Pedal skin with normal turgor, texture and tone b/l.  No open wounds. No interdigital macerations.   Toenails 1-5 b/l thick, discolored, elongated with subungual debris and pain on dorsal palpation.   No hyperkeratotic nor porokeratotic lesions present on today's visit.  Musculoskeletal Examination: Muscle strength 5/5 to all lower extremity muscle groups bilaterally. No pain, crepitus or joint limitation noted with ROM bilateral LE. No gross bony deformities bilaterally.  Radiographs: None  Last A1c:      Latest Ref Rng & Units 07/15/2022    2:21 PM 07/06/2022   10:42 AM 12/31/2021    9:05 AM  Hemoglobin A1C  Hemoglobin-A1c 4.8 - 5.6 % 7.1  6.9  6.9      Assessment:   1. Pain due to  onychomycosis of toenails of both feet   2. Diabetic peripheral neuropathy associated with type 2 diabetes mellitus (HCC)    Plan:  Patient was evaluated and treated and all questions answered. Consent given for treatment as described below: -Patient was evaluated and treated. All patient's and/or POA's questions/concerns answered on today's visit. -Continue foot and shoe inspections daily. Monitor blood glucose per PCP/Endocrinologist's recommendations. -Patient to continue soft, supportive shoe gear daily. -Toenails 1-5 b/l were debrided in length and girth with sterile nail nippers and dremel without iatrogenic bleeding.  -Patient/POA to call should there be question/concern in the interim.  Return in about 3 months (around 12/17/2022).  Freddie Breech, DPM

## 2022-09-24 ENCOUNTER — Institutional Professional Consult (permissible substitution): Payer: Medicare Other | Admitting: Nurse Practitioner

## 2022-10-13 ENCOUNTER — Ambulatory Visit: Payer: Self-pay | Admitting: *Deleted

## 2022-10-13 NOTE — Patient Outreach (Signed)
   Health Equity Plan Care Coordination   10/13/2022 Name: Tracey [name] MRN: [id number] DOB: Jan 12, 1956   Care Coordination Outreach Attempts:  An unsuccessful telephone outreach was attempted for a scheduled appointment today.  Follow Up Plan:  Additional outreach attempts will be made to offer the patient care coordination information and services.   Encounter Outcome:  No Answer. Left HIPAA compliant VM.   Care Coordination Interventions:  No, not indicated    Demetrios Loll, BSN, RN-BC RN Care Coordinator [facility]  [facility] Direct Dial: [phone] Main #: [phone]

## 2022-10-17 ENCOUNTER — Other Ambulatory Visit: Payer: Self-pay | Admitting: Nurse Practitioner

## 2022-10-17 DIAGNOSIS — E1165 Type 2 diabetes mellitus with hyperglycemia: Secondary | ICD-10-CM

## 2022-10-19 ENCOUNTER — Telehealth: Payer: Self-pay | Admitting: *Deleted

## 2022-10-19 NOTE — Telephone Encounter (Signed)
Estreya Clay from [facility] called. She is stating that the patient's insurance will not cover the patient taking 25 units three times a day. This would be more that a 30 day supply. One (1) box is a 40 day supply. Tobie Hellen is asking if you will write for the patient to have 25 units up to 34 units three times a day this would be a 29 day supply and the insurance will pay for it. Or just write for the 34 units three times a day, with the patient knowing that  she injects 25 units three times daily.

## 2022-10-20 ENCOUNTER — Other Ambulatory Visit: Payer: Self-pay | Admitting: *Deleted

## 2022-10-20 DIAGNOSIS — E1165 Type 2 diabetes mellitus with hyperglycemia: Secondary | ICD-10-CM

## 2022-10-20 MED ORDER — HUMULIN R U-500 KWIKPEN 500 UNIT/ML ~~LOC~~ SOPN: 34.0000 [IU] | PEN_INJECTOR | Freq: Three times a day (TID) | SUBCUTANEOUS | 2 refills | Status: DC

## 2022-10-20 NOTE — Telephone Encounter (Signed)
A prescription has been sent in to the [facility].

## 2022-10-20 NOTE — Telephone Encounter (Signed)
Yes, that's fine we can chang it to the 34 units TID to allow for her to get her full supply from the pharmacy.

## 2022-10-27 ENCOUNTER — Other Ambulatory Visit: Payer: Self-pay

## 2022-10-27 DIAGNOSIS — E1165 Type 2 diabetes mellitus with hyperglycemia: Secondary | ICD-10-CM

## 2022-10-27 MED ORDER — ACCU-CHEK GUIDE VI STRP: ORAL_STRIP | 2 refills | Status: DC

## 2022-10-29 ENCOUNTER — Ambulatory Visit: Payer: Self-pay | Admitting: *Deleted

## 2022-10-29 ENCOUNTER — Encounter: Payer: Self-pay | Admitting: *Deleted

## 2022-11-02 NOTE — Patient Outreach (Signed)
Care Coordination   Follow Up Visit Note   10/29/2022 Name: Tracey [name] MRN: [id number] DOB: 03/07/1956  Tracey [name] is a 67 y.o. year old female who sees Lodema Hong, Milus Mallick, MD for primary care. I spoke with  Edward Jolly by phone today.  What matters to the patients health and wellness today?  Getting blood sugar test strips    Goals Addressed             This Visit's Progress    Decrease A1C to <7% over the Next 6 Months       Care Coordination Goals: Patient will follow-up with PCP and/or endocrinologist every 3 months or as recommended [name], NP (endo) 11/05/22 Patient will take medication as prescribed and reach out to provider with any negative side effects Patient will monitor and record blood sugar 3 times per day and as needed with glucometer or Freestyle Libre CGM, and will call endocrinologist with any readings outside of recommended range Patient will talk with endocrinologist at visit on 11/05/22 about Freestyle Libre sensors not sticking to her to arm properly Encouraged to clean the area with a alcohol and allow it to dry prior to application Recommended OTC products like Skin Tac or a cover that goes over the sensor to provide extra adherence assistance Message sent to [name], NP re: issues with sensor falling off prematurely Patient will pick-up blood sugar test strips at pharmacy Talked with [facility] where rx was sent on 7/16. Pharmacy benefit doesn't cover the sensors there Messaged [name], NP that patient has been able to pick them up from [facility] in the past. Requested that the prescription be sent to them or to a Medicare diabetic DME supplier for mail order Patient will take blood sugar log and meter to provider visits for review Patient will reach out to RN Care Coordinator [phone] with any care coordination or resource needs      Over the next 3 Months Patient will Monitor Blood Pressure Daily        Care Coordination Goals: Patient will take medications as directed and report any negative side effects to provider  Patient will monitor and record blood pressure daily and as needed and will call PCP or specialist with any readings outside of recommended range Patient confirmed that she does have a blood pressure monitor but that she does not use it because she doesn't feel like it is out of range Patient will keep all recommended follow-up appointments with PCP and specialists (cardiology, nephrology, etc) PCP, Dr Lodema Hong 01/28/23 Patient will take blood pressure log to PCP and specialty appointments for review Patient will state understanding of need to monitor blood pressure regularly Patient will reach out to RN Care Coordinator [phone] with any care coordination or resource needs           SDOH assessments and interventions completed:  Yes  SDOH Interventions Today    Flowsheet Row Most Recent Value  SDOH Interventions   Transportation Interventions Intervention Not Indicated  Physical Activity Interventions Other (Comments), Patient Declined  [uses cane for ambulation. Performs some ADLs and has assistance with others. A little easier to move since she has lost weight since starting ozempic]        Care Coordination Interventions:  Yes, provided  Interventions Today    Flowsheet Row Most Recent Value  Chronic Disease   Chronic disease during today's visit Diabetes, Hypertension (HTN)  General Interventions   General Interventions Discussed/Reviewed General Interventions  Discussed, General Interventions Reviewed, Labs, Durable Medical Equipment (DME), Doctor Visits, Communication with  [Blood sugar 153 this am. No recent readings <70 or >200. Pt is not checking blood pressure.]  Labs Hgb A1c every 3 months  Doctor Visits Discussed/Reviewed Doctor Visits Discussed, Doctor Visits Reviewed, Specialist, PCP  Durable Medical Equipment (DME) BP Cuff, Glucomoter,  Other, Walker  [Freestyle Libre CGM, rolling walker, and cane]  PCP/Specialist Visits Compliance with follow-up visit  [name], NP (endo) on 11/05/22 and PCP, Dr Lodema Hong on 01/28/23]  Communication with PCP/Specialists, Pharmacists  [Staff message [name], NP re: Josephine Igo sensors fall off arm & test strips aren't covered at [facility] under  Pharm benefit. Pt requesting [facility]. May need to be sent to Medicare diabetic DME supplier to be covered under medical benefit]  Exercise Interventions   Exercise Discussed/Reviewed Physical Activity  Physical Activity Discussed/Reviewed Physical Activity Discussed, Physical Activity Reviewed  Education Interventions   Education Provided Provided Education  Provided Verbal Education On Labs, Blood Sugar Monitoring, When to see the doctor, Medication, Other  [Reasons why it is important to monitor blood pressure daily. Recommended skin tac or a cover for libre sensors to help them stick better,  talk with endo at visit on 7/25. Educated on diabetic supply coverage under pharm benefit vs med benefit]  Labs Reviewed Hgb A1c  [07/15/22 A1C 7.1]  Pharmacy Interventions   Pharmacy Dicussed/Reviewed Pharmacy Topics Discussed, Pharmacy Topics Reviewed, Medications and their functions  Safety Interventions   Safety Discussed/Reviewed Safety Discussed, Safety Reviewed, Fall Risk, Home Safety  Home Safety Assistive Devices  [Has a rolling walker and cane. Primarily uses cane. Has an aide that comes in 2-3 hours a day Mon-Fri.]       Follow up plan: Follow up call scheduled for 11/06/22    Encounter Outcome:  Pt. Visit Completed   Demetrios Loll, BSN, RN-BC RN Care Coordinator [facility]  [facility] Direct Dial: [phone] Main #: [phone]

## 2022-11-05 ENCOUNTER — Ambulatory Visit: Payer: Medicare Other | Admitting: Nurse Practitioner

## 2022-11-05 DIAGNOSIS — E782 Mixed hyperlipidemia: Secondary | ICD-10-CM

## 2022-11-05 DIAGNOSIS — Z7984 Long term (current) use of oral hypoglycemic drugs: Secondary | ICD-10-CM

## 2022-11-05 DIAGNOSIS — Z794 Long term (current) use of insulin: Secondary | ICD-10-CM

## 2022-11-05 DIAGNOSIS — E559 Vitamin D deficiency, unspecified: Secondary | ICD-10-CM

## 2022-11-05 DIAGNOSIS — Z7985 Long-term (current) use of injectable non-insulin antidiabetic drugs: Secondary | ICD-10-CM

## 2022-11-05 DIAGNOSIS — I10 Essential (primary) hypertension: Secondary | ICD-10-CM

## 2022-11-06 ENCOUNTER — Other Ambulatory Visit: Payer: Self-pay

## 2022-11-06 ENCOUNTER — Ambulatory Visit: Payer: Self-pay | Admitting: *Deleted

## 2022-11-06 ENCOUNTER — Encounter: Payer: Self-pay | Admitting: *Deleted

## 2022-11-06 DIAGNOSIS — E1165 Type 2 diabetes mellitus with hyperglycemia: Secondary | ICD-10-CM

## 2022-11-06 MED ORDER — FREESTYLE LIBRE 2 SENSOR MISC: 0 refills | Status: DC

## 2022-11-06 NOTE — Patient Outreach (Signed)
  Care Coordination   Follow Up Visit Note   11/06/2022 Name: Tracey [name] MRN: [id number] DOB: [birth year]  Tracey [name] is a 67 y.o. year old female who sees Lodema Hong, Milus Mallick, MD for primary care. I spoke with  Edward Jolly by phone today.  What matters to the patients health and wellness today?  Getting a refill on [employer] and having test strips sent to [facility] Addressed             This Visit's Progress    Decrease A1C to <7% over the Next 6 Months       Care Coordination Goals: Patient will follow-up with PCP and/or endocrinologist every 3 months or as recommended [name], NP (endo) 11/05/22. Missed this appointment due to illness. Will need to reschedule.  Patient will review handout on Skin Tac and Freestyle Josephine Igo covers that may help her libre sensor to stick better Patient will pick-up blood sugar test strips at pharmacy [facility] Endocrinology Clinical Pool regarding need for refill on Morse sensors and need to have glucometer test strips sent to [facility]. Patient has been able to pick those up there in the past. [facility] isn't able to file under pharm benefit. Unsure why. Patient will reach out to RN Care Coordinator [phone] with any care coordination or resource needs         SDOH assessments and interventions completed:  No   Care Coordination Interventions:  Yes, provided  Interventions Today    Flowsheet Row Most Recent Value  Chronic Disease   Chronic disease during today's visit Diabetes  General Interventions   General Interventions Discussed/Reviewed General Interventions Discussed, General Interventions Reviewed, Communication with, Doctor Visits  Doctor Visits Discussed/Reviewed Doctor Visits Discussed, Doctor Visits Reviewed, Specialist  PCP/Specialist Visits Compliance with follow-up visit  [facility]  endocrinology visit. Patient missed appt yesterday due to illness.  She is starting to feel better.]  Communication with PCP/Specialists  [staff message sent to [facility] Endocrinology Clinical Pool re: why patient missed appt, need for [facility] sensor refill, and patient's request to send test strips to [facility] instead of [facility]]  Education Interventions   Education Provided Provided Therapist, sports, Provided Education  Provided Verbal Education On When to see the doctor  [printed information on Skin Tac and Libre sensor covers. These may help keep her sensor in place for longer. Advised to clean the area with alcohol and allow to dry before applying sensor.]  Pharmacy Interventions   Pharmacy Dicussed/Reviewed Pharmacy Topics Discussed, Pharmacy Topics Reviewed  [Sensors and test strips]       Follow up plan: Follow up call scheduled for 11/24/22    Encounter Outcome:  Pt. Visit Completed

## 2022-11-11 ENCOUNTER — Ambulatory Visit (INDEPENDENT_AMBULATORY_CARE_PROVIDER_SITE_OTHER): Payer: Medicare Other | Admitting: Nurse Practitioner

## 2022-11-11 ENCOUNTER — Encounter: Payer: Self-pay | Admitting: Nurse Practitioner

## 2022-11-11 VITALS — BP 116/79 | HR 106 | Ht 70.0 in | Wt 352.4 lb

## 2022-11-11 DIAGNOSIS — Z7985 Long-term (current) use of injectable non-insulin antidiabetic drugs: Secondary | ICD-10-CM

## 2022-11-11 DIAGNOSIS — E119 Type 2 diabetes mellitus without complications: Secondary | ICD-10-CM | POA: Diagnosis not present

## 2022-11-11 DIAGNOSIS — Z794 Long term (current) use of insulin: Secondary | ICD-10-CM

## 2022-11-11 DIAGNOSIS — E559 Vitamin D deficiency, unspecified: Secondary | ICD-10-CM

## 2022-11-11 DIAGNOSIS — E782 Mixed hyperlipidemia: Secondary | ICD-10-CM | POA: Diagnosis not present

## 2022-11-11 DIAGNOSIS — Z7984 Long term (current) use of oral hypoglycemic drugs: Secondary | ICD-10-CM

## 2022-11-11 DIAGNOSIS — I10 Essential (primary) hypertension: Secondary | ICD-10-CM | POA: Diagnosis not present

## 2022-11-11 LAB — POCT GLYCOSYLATED HEMOGLOBIN (HGB A1C): Hemoglobin A1C: 7.1 % — AB (ref 4.0–5.6)

## 2022-11-11 MED ORDER — METFORMIN HCL 1000 MG PO TABS: 1000.0000 mg | ORAL_TABLET | Freq: Two times a day (BID) | ORAL | 3 refills | Status: DC

## 2022-11-11 MED ORDER — SEMAGLUTIDE (2 MG/DOSE) 8 MG/3ML ~~LOC~~ SOPN: 2.0000 mg | PEN_INJECTOR | SUBCUTANEOUS | 1 refills | Status: DC

## 2022-11-11 MED ORDER — FREESTYLE LIBRE 14 DAY SENSOR MISC: 1.0000 | 2 refills | Status: DC

## 2022-11-11 NOTE — Progress Notes (Signed)
11/11/2022, 10:50 AM   Endocrinology Follow Up Visit    Subjective:    Patient ID: Tracey [name], female    DOB: [birth year].  ALANE BOUDREAU is being engaged in follow-up for management of currently uncontrolled symptomatic type 2 diabetes, hyperlipidemia, hypertension.    PMD:   Kerri Perches, MD.  Past Medical History:  Diagnosis Date   Arthritis    Diabetes mellitus without complication (HCC)    Diastolic dysfunction 10/2009   Grade 1   GERD (gastroesophageal reflux disease)    Hyperlipidemia    Hyperlipidemia    Hypertension 04/15/2012   Morbid obesity (HCC)    PE (pulmonary embolism)    Pulmonary emboli (HCC) 1999   post TAH   Sleep apnea    Past Surgical History:  Procedure Laterality Date   ABDOMINAL HYSTERECTOMY  1999   , totalfor fibroids   CESAREAN SECTION     CHOLECYSTECTOMY  1996   COLONOSCOPY N/A 10/31/2015   Procedure: COLONOSCOPY;  Surgeon: Malissa Hippo, MD;  Location: [facility];  Service: Endoscopy;  Laterality: N/A;  930   FOOT SURGERY Right    October 2022   Social History   Socioeconomic History   Marital status: Single    Spouse name: Not on file   Number of children: Not on file   Years of education: Not on file   Highest education level: Not on file  Occupational History   Not on file  Tobacco Use   Smoking status: Never   Smokeless tobacco: Never  Vaping Use   Vaping status: Never Used  Substance and Sexual Activity   Alcohol use: No   Drug use: No   Sexual activity: Not Currently  Other Topics Concern   Not on file  Social History Narrative   Not on file   Social Determinants of Health   Financial Resource Strain: Medium Risk (06/15/2022)   Overall Financial Resource Strain (CARDIA)    Difficulty of Paying Living Expenses: Somewhat hard  Food Insecurity: Food Insecurity Present (06/15/2022)    Hunger Vital Sign    Worried About Running Out of Food in the Last Year: Sometimes true    Ran Out of Food in the Last Year: Sometimes true  Transportation Needs: No Transportation Needs (10/29/2022)   PRAPARE - Administrator, Civil Service (Medical): No    Lack of Transportation (Non-Medical): No  Physical Activity: Inactive (10/29/2022)   Exercise Vital Sign    Days of Exercise per Week: 0 days    Minutes of Exercise per Session: 0 min  Stress: No Stress Concern Present (06/15/2022)   [employer] of Occupational Health - Occupational Stress Questionnaire    Feeling of Stress : Only a little  Social Connections: Socially Isolated (06/15/2022)   Social Connection and Isolation Panel [NHANES]    Frequency of Communication with Friends and Family: More than three times a week    Frequency  of Social Gatherings with Friends and Family: Three times a week    Attends Religious Services: Never    Active Member of Clubs or Organizations: No    Attends Banker Meetings: Never    Marital Status: Divorced   Outpatient Encounter Medications as of 11/11/2022  Medication Sig   acetaminophen (TYLENOL) 500 MG tablet Take 500 mg by mouth every 6 (six) hours as needed for moderate pain.   aspirin EC 81 MG tablet Take 1 tablet (81 mg total) by mouth daily.   blood glucose meter kit and supplies KIT Dispense based on patient and insurance preference. Use up to four times daily as directed.   Continuous Glucose Sensor (FREESTYLE LIBRE 14 DAY SENSOR) MISC Inject 1 each into the skin every 14 (fourteen) days. Use as directed.   glucose blood (ACCU-CHEK GUIDE) test strip USE 1 STRIP TO CHECK GLUCOSE 3 TIMES DAILY AS DIRECTED   insulin regular human CONCENTRATED (HUMULIN R U-500 KWIKPEN) 500 UNIT/ML KwikPen Inject 34 Units into the skin 3 (three) times daily with meals.   Lancets MISC Use to test BG qid. Dx : E11.65. Softclix Lancets   metoprolol tartrate (LOPRESSOR) 50 MG tablet TAKE  ONE TABLET (50MG  TOTAL) BY MOUTH TWO TIMES DAILY   mupirocin ointment (BACTROBAN) 2 % Apply to affected area once daily.   olmesartan (BENICAR) 20 MG tablet TAKE ONE TABLET (20MG  TOTAL) BY MOUTH DAILY   omeprazole (PRILOSEC) 20 MG capsule TAKE ONE CAPSULE BY MOUTH DAILY   rosuvastatin (CRESTOR) 20 MG tablet TAKE ONE TABLET (20MG  TOTAL) BY MOUTH DAILY   Semaglutide, 2 MG/DOSE, 8 MG/3ML SOPN Inject 2 mg as directed once a week.   [DISCONTINUED] Continuous Glucose Sensor (FREESTYLE LIBRE 2 SENSOR) MISC CHANGE SENSOR EVERY 14 DAYS   [DISCONTINUED] metFORMIN (GLUCOPHAGE) 1000 MG tablet TAKE ONE TABLET BY MOUTH TWICE A DAY WITH A MEAL   metFORMIN (GLUCOPHAGE) 1000 MG tablet Take 1 tablet (1,000 mg total) by mouth 2 (two) times daily with a meal.   spironolactone (ALDACTONE) 25 MG tablet TAKE ONE (1) TABLET BY MOUTH EVERY DAY   UNIFINE PENTIPS 31G X 8 MM MISC USE FOR INSULIN INJECTIONS THREE TIMES DAILY.   [DISCONTINUED] Semaglutide, 1 MG/DOSE, (OZEMPIC, 1 MG/DOSE,) 4 MG/3ML SOPN INJECT 1 MG SUBCUTANEOUSLY ONCE A WEEK AS  DRIECTED.   No facility-administered encounter medications on file as of 11/11/2022.    ALLERGIES: Allergies  Allergen Reactions   Haloperidol Lactate Anaphylaxis   Lipitor [Atorvastatin Calcium] Other (See Comments)    Markedly elevated liver enzymes    VACCINATION STATUS: Immunization History  Administered Date(s) Administered   Influenza,inj,Quad PF,6+ Mos 04/01/2015, 03/16/2016, 01/20/2017, 04/11/2018, 02/13/2019, 01/31/2020, 12/05/2020   Moderna Sars-Covid-2 Vaccination 12/13/2019, 01/11/2020   PNEUMOCOCCAL CONJUGATE-20 12/05/2020   Pneumococcal Polysaccharide-23 05/13/2015    Diabetes She presents for her follow-up diabetic visit. She has type 2 diabetes mellitus. Onset time: He was diagnosed at approximate age of 30 years. Her disease course has been stable. There are no hypoglycemic associated symptoms. Pertinent negatives for hypoglycemia include no confusion,  headaches, pallor or seizures. Associated symptoms include fatigue. Pertinent negatives for diabetes include no blurred vision, no chest pain, no polydipsia, no polyphagia and no polyuria. There are no hypoglycemic complications. Symptoms are stable. Diabetic complications include heart disease and nephropathy. Risk factors for coronary artery disease include diabetes mellitus, dyslipidemia, family history, hypertension, obesity, post-menopausal and sedentary lifestyle. Current diabetic treatment includes intensive insulin program and oral agent (monotherapy) (and Ozempic). She is compliant  with treatment most of the time. Her weight is fluctuating minimally. She is following a generally healthy diet. When asked about meal planning, she reported none. She has had a previous visit with a dietitian. She never participates in exercise. Her home blood glucose trend is fluctuating minimally. Her overall blood glucose range is 140-180 mg/dl. (She presents today with her meter, logs, and CGM showing at goal glycemic profile overall.  Her POCT A1c today is 7.1%, unchanged from last visit.  Analysis of her CGM shows TIR 90%, TAR 10%, TBR 0%.  She notes she is almost out of her 30 day [place].  Unfortunately her insurance would not pay for another receiver as she had the 14 libre prescribed in 2020.  She could most certainly benefit from a CGM with alarms given her concentrated insulin administration.) An ACE inhibitor/angiotensin II receptor blocker is not being taken. She does not see a podiatrist.Eye exam is not current.  Hyperlipidemia This is a chronic problem. The current episode started more than 1 year ago. The problem is uncontrolled. Recent lipid tests were reviewed and are high. Exacerbating diseases include chronic renal disease, diabetes and obesity. Factors aggravating her hyperlipidemia include fatty foods. Pertinent negatives include no chest pain, myalgias or shortness of breath. Current antihyperlipidemic  treatment includes statins. The current treatment provides moderate improvement of lipids. Compliance problems include adherence to diet and adherence to exercise.  Risk factors for coronary artery disease include diabetes mellitus, dyslipidemia, hypertension, obesity, a sedentary lifestyle and post-menopausal.  Hypertension This is a chronic problem. The current episode started more than 1 year ago. The problem is unchanged. The problem is uncontrolled. Pertinent negatives include no blurred vision, chest pain, headaches, palpitations or shortness of breath. There are no associated agents to hypertension. Risk factors for coronary artery disease include dyslipidemia, diabetes mellitus, obesity, sedentary lifestyle and post-menopausal state. Past treatments include beta blockers and diuretics. The current treatment provides moderate improvement. Compliance problems include exercise and diet.  Hypertensive end-organ damage includes kidney disease. Identifiable causes of hypertension include chronic renal disease.   Review of systems  Constitutional: + stable body weight,  current Body mass index is 50.56 kg/m. , + intermittent fatigue, no subjective hyperthermia, no subjective hypothermia Eyes: no blurry vision, no xerophthalmia ENT: no sore throat, no nodules palpated in throat, no dysphagia/odynophagia, no hoarseness Cardiovascular: no chest pain, no shortness of breath, no palpitations, no leg swelling Respiratory: no cough, no shortness of breath Gastrointestinal: no nausea/vomiting/diarrhea Musculoskeletal: no muscle/joint aches Skin: no rashes, no hyperemia Neurological: no tremors, no numbness, no tingling, no dizziness Psychiatric: no depression, no anxiety    Objective:    BP 116/79 (BP Location: Left Arm, Patient Position: Sitting, Cuff Size: Large)   Pulse (!) 106   Ht 5\' 10"  (1.778 m)   Wt (!) 352 lb 6.4 oz (159.8 kg)   BMI 50.56 kg/m   Wt Readings from Last 3 Encounters:   11/11/22 (!) 352 lb 6.4 oz (159.8 kg)  08/04/22 (!) 353 lb (160.1 kg)  07/15/22 (!) 351 lb 0.6 oz (159.2 kg)     BP Readings from Last 3 Encounters:  11/11/22 116/79  09/16/22 (!) 142/84  08/04/22 128/76     Physical Exam- Limited  Constitutional:  Body mass index is 50.56 kg/m. , not in acute distress, normal state of mind Eyes:  EOMI, no exophthalmos Musculoskeletal: no gross deformities, strength intact in all four extremities, no gross restriction of joint movements Skin:  no rashes, no hyperemia Neurological:  no tremor with outstretched hands   Diabetic Foot Exam - Simple   No data filed     CMP     Component Value Date/Time   NA 140 07/15/2022 1421   K 4.4 07/15/2022 1421   CL 102 07/15/2022 1421   CO2 21 07/15/2022 1421   GLUCOSE 149 (H) 07/15/2022 1421   GLUCOSE 148 (H) 12/21/2019 1252   BUN 7 (L) 07/15/2022 1421   CREATININE 0.83 07/15/2022 1421   CREATININE 0.71 12/21/2019 1252   CALCIUM 9.2 07/15/2022 1421   PROT 7.5 07/15/2022 1421   ALBUMIN 4.1 07/15/2022 1421   AST 34 07/15/2022 1421   ALT 38 (H) 07/15/2022 1421   ALKPHOS 66 07/15/2022 1421   BILITOT 0.4 07/15/2022 1421   GFRNONAA 77 02/13/2019 1207   GFRAA 90 02/13/2019 1207    Diabetic Labs (most recent): Lab Results  Component Value Date   HGBA1C 7.1 (A) 11/11/2022   HGBA1C 7.1 (H) 07/15/2022   HGBA1C 6.9 (A) 07/06/2022   MICROALBUR 30 05/22/2021   MICROALBUR 7.9 12/21/2019   MICROALBUR 63.8 (H) 04/12/2018    Lipid Panel     Component Value Date/Time   CHOL 154 07/15/2022 1421   TRIG 121 07/15/2022 1421   HDL 42 07/15/2022 1421   CHOLHDL 3.7 07/15/2022 1421   CHOLHDL 3.5 02/01/2020 1415   VLDL 23 07/13/2016 1430   LDLCALC 90 07/15/2022 1421   LDLCALC 75 02/01/2020 1415      Lab Results  Component Value Date   TSH 0.722 07/15/2022   TSH 0.955 06/24/2021   TSH 0.727 12/05/2020   TSH 0.69 02/13/2019   TSH 1.23 05/09/2018   TSH 0.95 07/13/2016   TSH 1.26 07/10/2015    TSH 1.382 11/17/2013   TSH 0.519 04/14/2012   TSH 1.525 11/07/2009      Lipid Panel     Component Value Date/Time   CHOL 154 07/15/2022 1421   TRIG 121 07/15/2022 1421   HDL 42 07/15/2022 1421   CHOLHDL 3.7 07/15/2022 1421   CHOLHDL 3.5 02/01/2020 1415   VLDL 23 07/13/2016 1430   LDLCALC 90 07/15/2022 1421   LDLCALC 75 02/01/2020 1415     Assessment & Plan:   1) Type 2 Diabetes without complication, with long-term current use of insulin  - Saphia D Scanlon has currently uncontrolled symptomatic type 2 DM since 67 years of age.  She presents today with her meter, logs, and CGM showing at goal glycemic profile overall.  Her POCT A1c today is 7.1%, unchanged from last visit.  Analysis of her CGM shows TIR 90%, TAR 10%, TBR 0%.  She notes she is almost out of her 25 day [place].  Unfortunately her insurance would not pay for another receiver as she had the 14 libre prescribed in 2020.  She could most certainly benefit from a CGM with alarms given her concentrated insulin administration.  -her diabetes is complicated by obesity/sedentary life and KOLINA MEEUWSEN remains at a high risk for more acute and chronic complications which include CAD, CVA, CKD, retinopathy, and neuropathy. These are all discussed in detail with the patient.  - Nutritional counseling repeated at each appointment due to patients tendency to fall back in to old habits.  - The patient admits there is a room for improvement in their diet and drink choices. -  Suggestion is made for the patient to avoid simple carbohydrates from their diet including Cakes, Sweet Desserts / Pastries, Ice Cream, Soda (diet and regular), Sweet  Tea, Candies, Chips, Cookies, Sweet Pastries, Store Bought Juices, Alcohol in Excess of 1-2 drinks a day, Artificial Sweeteners, Coffee Creamer, and "Sugar-free" Products. This will help patient to have stable blood glucose profile and potentially avoid unintended weight gain.   - I encouraged  the patient to switch to unprocessed or minimally processed complex starch and increased protein intake (animal or plant source), fruits, and vegetables.   - Patient is advised to stick to a routine mealtimes to eat 3 meals a day and avoid unnecessary snacks (to snack only to correct hypoglycemia).  - I have approached her with the following individualized plan to manage diabetes and patient agrees:   -She is advised to lower U500 to 25 units TID with meals if glucose is above 90 and she is eating and increase her Ozempic to 2 mg SQ weekly, and continue her Metformin 1000 mg po twice daily with meals for now.   -Encouraged patient to monitor blood glucose 4 times per day (using her CGM), before meals and at bedtime and notify the clinic if blood glucose levels are less than 70 or greater than 300 for 3 tests in a row.  She has benefited greatly from CGM device- is advised to restart using it.  Will look to upgrade when eligible with Medicare.  -She is also advised not to inject insulin without proper glucose monitoring.  2) BP/HTN:  Her blood pressure is controlled to target.  She typically waits to take her Spironolactone 25 mg until she gets home so she won't have to urinate as often.  She is advised to continue Metoprolol 50 mg po twice daily, Olmesartan 20 mg po daily, and continue Spironolactone 25 mg po daily.    3) Lipids/HPL:  Her recent lipid panel from 07/15/22 shows controlled LDL of 90.  She is advised to continue Crestor 20 mg po daily at bedtime.  Side effects and precautions discussed with her.  She has annual exam with her PCP coming up with labs.  4) Vitamin D Deficiency: Her recent vitamin D level was 24.1 on 07/15/22.  She has completed replenishment with ergocalciferol and is now taking OTC Vitamin D3 5000 units daily as maintenance dose.  She is advised to continue for now.    5) Chronic Care/Health Maintenance: -she is on Statin medications and is encouraged to continue to  follow up with Ophthalmology, Dentist, Podiatrist at least yearly or according to recommendations, and advised to stay away from smoking. I have recommended yearly flu vaccine and pneumonia vaccination at least every 5 years; moderate intensity exercise for up to 150 minutes weekly; and  sleep for at least 7 hours a day.  - I advised patient to maintain close follow up with Kerri Perches, MD for primary care needs.      I spent  30  minutes in the care of the patient today including review of labs from CMP, Lipids, Thyroid Function, Hematology (current and previous including abstractions from other facilities); face-to-face time discussing  her blood glucose readings/logs, discussing hypoglycemia and hyperglycemia episodes and symptoms, medications doses, her options of short and long term treatment based on the latest standards of care / guidelines;  discussion about incorporating lifestyle medicine;  and documenting the encounter. Risk reduction counseling performed per USPSTF guidelines to reduce obesity and cardiovascular risk factors.     Please refer to Patient Instructions for Blood Glucose Monitoring and Insulin/Medications Dosing Guide"  in media tab for additional information. Please  also refer to "  Patient Self Inventory" in the Media  tab for reviewed elements of pertinent patient history.  Edward Jolly participated in the discussions, expressed understanding, and voiced agreement with the above plans.  All questions were answered to her satisfaction. she is encouraged to contact clinic should she have any questions or concerns prior to her return visit.   Follow up plan: - Return in about 4 months (around 03/13/2023) for Diabetes F/U with A1c in office, No previsit labs, Bring meter and logs.   [name], [facility] [address] [place], [place] [address] Phone: [phone] Fax: [phone]  11/11/2022, 10:50 AM

## 2022-11-23 ENCOUNTER — Encounter: Payer: Self-pay | Admitting: Pulmonary Disease

## 2022-11-23 ENCOUNTER — Institutional Professional Consult (permissible substitution): Payer: Medicare Other | Admitting: Pulmonary Disease

## 2022-11-25 ENCOUNTER — Encounter: Payer: Self-pay | Admitting: *Deleted

## 2022-11-25 ENCOUNTER — Ambulatory Visit: Payer: Self-pay | Admitting: *Deleted

## 2022-11-25 NOTE — Patient Outreach (Signed)
Care Coordination   Health Equity Plan Follow Up Visit Note   11/25/2022 Name: Tracey [name] MRN: [id number] DOB: [birth year]  Tracey [name] is a 67 y.o. year old female who sees Tracey Perches, MD for primary care. I spoke with  Tracey [name] by phone today.  What matters to the patients health and wellness today?  Managing blood sugar    Goals Addressed             This Visit's Progress    Decrease A1C to <7% over the Next 6 Months   Not on track    Care Coordination Goals: Patient will follow-up with endocrinologist every 4 months or as recommended Tracey Bacon, NP (endocrinologist) 03/15/23 Patient will purchase SkinTac or sensor covers if needed to help secure sensors to arm.  Patient will monitor blood sugar with Freestyle Libre Coninuouse Glucose Monitor 4 times per day and as needed Patient will take monitor to provider appointments for review Patient will call endocrinologist for any readings less than 70 or if she has 3 readings in a row above 300 Patient will follow a modified carbohydrate diet. Eat 3 meals a day with 30GM of carbohydrates and up to 2 snacks a day with less than 15GM of carbohydrates.  Patient will upgrade to a continuous glucose monitor with alarms when Medicare will cover the cost of the monitor (covered Libre system in 2020) Patient will increase physical activity level as tolerated with an ultimate goal of at least 150 minutes of moderate activity per week Patient will reach out to RN Care Coordinator [phone] with any care coordination or resource needs      Schedule an Annual Eye Exam within the next 60 days   Not on track    Care Coordination Goals: Patient will schedule an annual diabetic eye exam within the next 60 days Patient will state understanding of the importance for diabetic eye exams and eye care Patient will reach out to RN Care Coordinator at [phone] with any resource or care coordination  needs              SDOH assessments and interventions completed:  Yes  SDOH Interventions Today    Flowsheet Row Most Recent Value  SDOH Interventions   Food Insecurity Interventions Intervention Not Indicated  Transportation Interventions Intervention Not Indicated  Financial Strain Interventions Intervention Not Indicated        Care Coordination Interventions:  Yes, provided  Interventions Today    Flowsheet Row Most Recent Value  Chronic Disease   Chronic disease during today's visit Diabetes  General Interventions   General Interventions Discussed/Reviewed General Interventions Discussed, General Interventions Reviewed, Labs, Durable Medical Equipment (DME), Annual Eye Exam, Annual Foot Exam, Doctor Visits  Labs Hgb A1c every 3 months  Doctor Visits Discussed/Reviewed Doctor Visits Discussed, Doctor Visits Reviewed, Annual Wellness Visits, PCP, Specialist  [reviewed and discussed recent visit with endocrinologist]  Durable Medical Equipment (DME) Glucomoter  [name] Libre continuous glucose monitor.Blood sugar this morning 184. Midday 147 and then dropped to 97. Libre sensors are sticking to her skin well now. Has not used SkinTac or sensor covers as recommended.]  PCP/Specialist Visits Compliance with follow-up visit  [podiatrist for diabetic foot exam on 12/21/22, Dr Lodema Hong 01/28/23, [facility] 02/01/23, 03/15/23 with Endocrinologist]  Exercise Interventions   Exercise Discussed/Reviewed Exercise Discussed, Exercise Reviewed, Physical Activity, Weight Managment  Physical Activity Discussed/Reviewed Physical Activity Discussed, Physical Activity Reviewed, Home Exercise Program (HEP)  [encouraged to increase physical activity level  as tolerated with an ultimate goal of 150 minutes of moderate activity per week]  Weight Management Weight loss  Education Interventions   Education Provided Provided Education, Provided Therapist, sports  [Printed education on diabetes and  nutrition]  Provided Verbal Education On Nutrition, Foot Care, Eye Care, Labs, Blood Sugar Monitoring, Medication, Exercise, When to see the doctor, Insurance Plans  [discussed insurance coverage of libre meters. Patient received last meter in 2020 and they will not cover a new meter until next year. Endocrinologist recommended one with alarms when insuranc will cover it. Advised that [name] have alarms]  Labs Reviewed Hgb A1c  [11/11/22 A1C 7.1]  Nutrition Interventions   Nutrition Discussed/Reviewed Nutrition Discussed, Nutrition Reviewed, Carbohydrate meal planning, Portion sizes, Decreasing sugar intake, Increasing proteins, Adding fruits and vegetables, Fluid intake  [3 meals per day with 30 GM of CHO per meal and up to 2 snacks per day with less than 15 GM of CHO]  Pharmacy Interventions   Pharmacy Dicussed/Reviewed Pharmacy Topics Discussed, Pharmacy Topics Reviewed, Medications and their functions  [Ozempic was incr. to 2mg  per week. patient will finish current suply before starting. Discussed potential side effects. Advised to reach out to endocrinologist if she has any.]  Safety Interventions   Safety Discussed/Reviewed Safety Discussed, Safety Reviewed       Follow up plan: Follow up call scheduled for 12/31/22    Encounter Outcome:  Pt. Visit Completed   Demetrios Loll, BSN, RN-BC RN Care Coordinator [facility]  [facility] Direct Dial: [phone] Main #: [phone]

## 2022-12-21 ENCOUNTER — Ambulatory Visit (INDEPENDENT_AMBULATORY_CARE_PROVIDER_SITE_OTHER): Payer: Medicare Other | Admitting: Podiatry

## 2022-12-21 DIAGNOSIS — Z91199 Patient's noncompliance with other medical treatment and regimen due to unspecified reason: Secondary | ICD-10-CM

## 2022-12-21 NOTE — Progress Notes (Signed)
No show

## 2022-12-31 ENCOUNTER — Ambulatory Visit: Payer: Self-pay | Admitting: *Deleted

## 2022-12-31 NOTE — Patient Outreach (Signed)
Health Equity Plan Care Coordination   12/31/2022 Name: Tracey [name] MRN: [id number] DOB: 1956/02/14   Care Coordination Outreach Attempts:  An unsuccessful telephone outreach was attempted for a scheduled appointment today.  Follow Up Plan:  Additional outreach attempts will be made to offer the patient care coordination information and services.   Encounter Outcome:  No Answer   Care Coordination Interventions:  No, not indicated    Demetrios Loll, RN, BSN Care Management Coordinator [facility]  [facility] Direct Dial: [phone] Main #: [phone]

## 2023-01-06 ENCOUNTER — Ambulatory Visit: Payer: Medicare Other | Admitting: Podiatry

## 2023-01-07 ENCOUNTER — Telehealth: Payer: Self-pay | Admitting: *Deleted

## 2023-01-07 NOTE — Progress Notes (Signed)
Health Equity Plan Note  01/07/2023 Name: KEASHIA PRINE MRN: [id number] DOB: [birth year]  PAMMY HELLER is a 67 y.o. year old female who is a primary care patient of Kerri Perches, MD and is actively engaged with the care management team. I reached out to Edward Jolly by phone today to assist with re-scheduling a follow up visit with the RN Case Manager  Follow up plan: Unsuccessful telephone outreach attempt made.  [facility]  Care Coordination Care Guide  Direct Dial: [phone]

## 2023-01-15 NOTE — Progress Notes (Signed)
  Health Equity Plan Note  01/15/2023 Name: [name] MRN: [id number] DOB: 1955-12-05  Tracey [name] is a 67 y.o. year old female who is a primary care patient of Kerri Perches, MD and is actively engaged with the care management team. I reached out to Edward Jolly by phone today to assist with re-scheduling a follow up visit with the RN Case Manager  Follow up plan: Unsuccessful telephone outreach attempt made. A HIPAA compliant phone message was left for the patient providing contact information and requesting a return call.  We have been unable to make contact with the patient for follow up. The care management team is available to follow up with the patient after provider conversation with the patient regarding recommendation for care management engagement and subsequent re-referral to the care management team.   [facility] Coordination Care Guide  Direct Dial: [phone]

## 2023-01-15 NOTE — Progress Notes (Signed)
  Health Equity Plan Note  01/15/2023 Name: Tracey [name] MRN: [id number] DOB: [birth year]  Tracey [name] is a 67 y.o. year old female who is a primary care patient of Kerri Perches, MD and is actively engaged with the care management team. I reached out to Edward Jolly by phone today to assist with re-scheduling a follow up visit with the RN Case Manager  Follow up plan: Telephone appointment with care management team member scheduled for: 02/09/23  [facility]  Care Coordination Care Guide  Direct Dial: [phone]

## 2023-01-19 ENCOUNTER — Ambulatory Visit: Payer: Medicare Other | Admitting: Podiatry

## 2023-01-28 ENCOUNTER — Ambulatory Visit: Payer: Medicare Other | Admitting: Family Medicine

## 2023-02-01 ENCOUNTER — Ambulatory Visit (INDEPENDENT_AMBULATORY_CARE_PROVIDER_SITE_OTHER): Payer: Medicare Other

## 2023-02-01 VITALS — Ht 70.0 in | Wt 356.0 lb

## 2023-02-01 DIAGNOSIS — Z0000 Encounter for general adult medical examination without abnormal findings: Secondary | ICD-10-CM | POA: Diagnosis not present

## 2023-02-01 NOTE — Progress Notes (Signed)
 Because this visit was a virtual/telehealth visit,  certain criteria was not obtained, such a blood pressure, CBG if applicable, and timed get up and go. Any medications not marked as "taking" were not mentioned during the medication reconciliation part of the visit. Any vitals not documented were not able to be obtained due to this being a telehealth visit or patient was unable to self-report a recent blood pressure reading due to a lack of equipment at home via telehealth. Vitals that have been documented are verbally provided by the patient.   Subjective:   Tracey [name] is a 67 y.o. female who presents for Medicare Annual (Subsequent) preventive examination.  Visit Complete: Virtual I connected with  Edward Jolly on 02/01/23 by a audio enabled telemedicine application and verified that I am speaking with the correct person using two identifiers.  Patient Location: Home  Provider Location: Home Office  I discussed the limitations of evaluation and management by telemedicine. The patient expressed understanding and agreed to proceed.  Vital Signs: Because this visit was a virtual/telehealth visit, some criteria may be missing or patient reported. Any vitals not documented were not able to be obtained and vitals that have been documented are patient reported.  Patient Medicare AWV questionnaire was completed by the patient on na; I have confirmed that all information answered by patient is correct and no changes since this date.  Cardiac Risk Factors include: advanced age (>51men, >27 women);diabetes mellitus;dyslipidemia;hypertension;obesity (BMI >30kg/m2);sedentary lifestyle     Objective:    Today's Vitals   02/01/23 1015  Weight: (!) 356 lb (161.5 kg)  Height: 5\' 10"  (1.778 m)   Body mass index is 51.08 kg/m.     02/01/2023   10:14 AM 01/07/2022    8:55 AM 12/04/2020    2:14 PM 11/22/2017    2:32 PM 11/16/2016    2:28 PM 10/22/2016    1:30 PM 05/12/2016   11:09 AM   Advanced Directives  Does Patient Have a Medical Advance Directive? No No No No No No No  Would patient like information on creating a medical advance directive? Yes (MAU/Ambulatory/Procedural Areas - Information given) Yes (MAU/Ambulatory/Procedural Areas - Information given)  Yes (ED - Information included in AVS) Yes (MAU/Ambulatory/Procedural Areas - Information given)      Current Medications (verified) Outpatient Encounter Medications as of 02/01/2023  Medication Sig   acetaminophen (TYLENOL) 500 MG tablet Take 500 mg by mouth every 6 (six) hours as needed for moderate pain.   aspirin EC 81 MG tablet Take 1 tablet (81 mg total) by mouth daily.   blood glucose meter kit and supplies KIT Dispense based on patient and insurance preference. Use up to four times daily as directed.   Continuous Glucose Sensor (FREESTYLE LIBRE 14 DAY SENSOR) MISC Inject 1 each into the skin every 14 (fourteen) days. Use as directed.   glucose blood (ACCU-CHEK GUIDE) test strip USE 1 STRIP TO CHECK GLUCOSE 3 TIMES DAILY AS DIRECTED   insulin regular human CONCENTRATED (HUMULIN R U-500 KWIKPEN) 500 UNIT/ML KwikPen Inject 34 Units into the skin 3 (three) times daily with meals.   Lancets MISC Use to test BG qid. Dx : E11.65. Softclix Lancets   metFORMIN (GLUCOPHAGE) 1000 MG tablet Take 1 tablet (1,000 mg total) by mouth 2 (two) times daily with a meal.   metoprolol tartrate (LOPRESSOR) 50 MG tablet TAKE ONE TABLET (50MG  TOTAL) BY MOUTH TWO TIMES DAILY   mupirocin ointment (BACTROBAN) 2 % Apply to affected area once  daily.   olmesartan (BENICAR) 20 MG tablet TAKE ONE TABLET (20MG  TOTAL) BY MOUTH DAILY   omeprazole (PRILOSEC) 20 MG capsule TAKE ONE CAPSULE BY MOUTH DAILY   rosuvastatin (CRESTOR) 20 MG tablet TAKE ONE TABLET (20MG  TOTAL) BY MOUTH DAILY   Semaglutide, 2 MG/DOSE, 8 MG/3ML SOPN Inject 2 mg as directed once a week.   spironolactone (ALDACTONE) 25 MG tablet TAKE ONE (1) TABLET BY MOUTH EVERY DAY    UNIFINE PENTIPS 31G X 8 MM MISC USE FOR INSULIN INJECTIONS THREE TIMES DAILY.   No facility-administered encounter medications on file as of 02/01/2023.    Allergies (verified) Haloperidol lactate and Lipitor [atorvastatin calcium]   History: Past Medical History:  Diagnosis Date   Arthritis    Diabetes mellitus without complication (HCC)    Diastolic dysfunction 10/2009   Grade 1   GERD (gastroesophageal reflux disease)    Hyperlipidemia    Hyperlipidemia    Hypertension 04/15/2012   Morbid obesity (HCC)    PE (pulmonary embolism)    Pulmonary emboli (HCC) 1999   post TAH   Sleep apnea    Past Surgical History:  Procedure Laterality Date   ABDOMINAL HYSTERECTOMY  1999   , totalfor fibroids   CESAREAN SECTION     CHOLECYSTECTOMY  1996   COLONOSCOPY N/A 10/31/2015   Procedure: COLONOSCOPY;  Surgeon: Malissa Hippo, MD;  Location: [facility];  Service: Endoscopy;  Laterality: N/A;  930   FOOT SURGERY Right    October 2022   Family History  Problem Relation Age of Onset   Diabetes Mother    Hypertension Mother    Depression Mother    Hyperlipidemia Father    Hypertension Father    Alcohol abuse Father    Cirrhosis Father    Aneurysm Brother    Sleep apnea Sister    Arthritis Sister    Coronary artery disease Sister    Heart attack Sister 27   Heart murmur Son    Hypertension Son    Depression Son    Social History   Socioeconomic History   Marital status: Single    Spouse name: Not on file   Number of children: Not on file   Years of education: Not on file   Highest education level: Not on file  Occupational History   Not on file  Tobacco Use   Smoking status: Never   Smokeless tobacco: Never  Vaping Use   Vaping status: Never Used  Substance and Sexual Activity   Alcohol use: No   Drug use: No   Sexual activity: Not Currently  Other Topics Concern   Not on file  Social History Narrative   Not on file   Social Determinants of Health    Financial Resource Strain: Low Risk  (02/01/2023)   Overall Financial Resource Strain (CARDIA)    Difficulty of Paying Living Expenses: Not very hard  Food Insecurity: Food Insecurity Present (02/01/2023)   Hunger Vital Sign    Worried About Running Out of Food in the Last Year: Sometimes true    Ran Out of Food in the Last Year: Sometimes true  Transportation Needs: No Transportation Needs (02/01/2023)   PRAPARE - Administrator, Civil Service (Medical): No    Lack of Transportation (Non-Medical): No  Physical Activity: Inactive (02/01/2023)   Exercise Vital Sign    Days of Exercise per Week: 0 days    Minutes of Exercise per Session: 0 min  Stress: No Stress  Concern Present (02/01/2023)   [employer] of Occupational Health - Occupational Stress Questionnaire    Feeling of Stress : Only a little  Social Connections: Moderately Isolated (02/01/2023)   Social Connection and Isolation Panel [NHANES]    Frequency of Communication with Friends and Family: More than three times a week    Frequency of Social Gatherings with Friends and Family: More than three times a week    Attends Religious Services: More than 4 times per year    Active Member of [employer] or Organizations: No    Attends Banker Meetings: Never    Marital Status: Divorced    Tobacco Counseling Counseling given: Yes   Clinical Intake:  Pre-visit preparation completed: Yes  Pain : No/denies pain     BMI - recorded: 51.08 Nutritional Status: BMI > 30  Obese Nutritional Risks: None Diabetes: Yes CBG done?: No (telehealth visit. unable to obtain cbg) Did pt. bring in CBG monitor from home?: No  How often do you need to have someone help you when you read instructions, pamphlets, or other written materials from your doctor or pharmacy?: 1 - Never  Interpreter Needed?: No  Information entered by ::  Saige Busby, CMA   Activities of Daily Living    02/01/2023   10:25 AM  In  your present state of health, do you have any difficulty performing the following activities:  Hearing? 0  Vision? 0  Difficulty concentrating or making decisions? 0  Walking or climbing stairs? 1  Comment uses cane or walker  Dressing or bathing? 0  Doing errands, shopping? 1  Comment sometimes her son has to to go with her to go in the store for her.  Preparing Food and eating ? Y  Comment patient states she can prepare food but has to sit down to do it.  Using the Toilet? N  In the past six months, have you accidently leaked urine? N  Do you have problems with loss of bowel control? N  Managing your Medications? N  Managing your Finances? N  Housekeeping or managing your Housekeeping? Y  Comment has someone come in several times a week to help with housekeeping.    Patient Care Team: Kerri Perches, MD as PCP - General (Family Medicine) Pricilla Riffle, MD as PCP - Cardiology (Cardiology) Pricilla Riffle, MD as Consulting Physician (Cardiology) Beryle Beams, MD as Consulting Physician (Neurology) Jethro Bolus, MD as Consulting Physician (Ophthalmology) Gwenith Daily, RN as [facility] Care Management  Indicate any recent Medical Services you may have received from other than [facility] providers in the past year (date may be approximate).     Assessment:   This is a routine wellness examination for Mahek.  Hearing/Vision screen Hearing Screening - Comments:: Patient denies any hearing difficulties.   Vision Screening - Comments:: Patient will be seeing [facility] in [place] in January   Goals Addressed             This Visit's Progress    Patient Stated       Get to where I can have better mobility and walk better and not have to use the electric scooter when I go into a store.        Depression Screen    02/01/2023   10:19 AM 07/15/2022    1:29 PM 06/15/2022    5:04 PM 02/10/2022   11:38 AM 01/07/2022    8:56 AM 02/07/2021    3:14 PM  12/04/2020    2:11 PM  PHQ 2/9 Scores  PHQ - 2 Score 0 0 1 0 0 0 0  PHQ- 9 Score 0 3         Fall Risk    02/01/2023   10:25 AM 07/15/2022    1:29 PM 02/10/2022   11:38 AM 01/07/2022    8:56 AM 09/10/2021    3:51 PM  Fall Risk   Falls in the past year? 1 1 1 1 1   Number falls in past yr: 1 1 0 1 0  Injury with Fall? 1 0 0 0 0  Risk for fall due to : History of fall(s);Impaired balance/gait;Orthopedic patient History of fall(s);Impaired balance/gait Impaired balance/gait History of fall(s);Impaired mobility   Follow up Education provided;Falls prevention discussed Falls evaluation completed Falls evaluation completed Falls evaluation completed;Falls prevention discussed;Education provided     MEDICARE RISK AT HOME: Medicare Risk at Home Any stairs in or around the home?: Yes If so, are there any without handrails?: No Home free of loose throw rugs in walkways, pet beds, electrical cords, etc?: Yes Adequate lighting in your home to reduce risk of falls?: Yes Life alert?: No Use of a cane, walker or w/c?: Yes Grab bars in the bathroom?: No Shower chair or bench in shower?: Yes Elevated toilet seat or a handicapped toilet?: Yes  TIMED UP AND GO:  Was the test performed?  No    Cognitive Function:        02/01/2023   10:19 AM 01/07/2022    9:01 AM 12/04/2020    2:17 PM 11/30/2019    9:19 AM 11/29/2018    3:16 PM  6CIT Screen  What Year? 0 points 0 points 0 points 0 points 0 points  What month? 0 points 0 points 0 points 0 points 0 points  What time? 0 points 0 points 0 points 0 points 0 points  Count back from 20 0 points 0 points 0 points 0 points 0 points  Months in reverse 0 points 0 points 0 points 0 points 0 points  Repeat phrase 0 points 2 points 0 points 0 points 0 points  Total Score 0 points 2 points 0 points 0 points 0 points    Immunizations Immunization History  Administered Date(s) Administered   Influenza,inj,Quad PF,6+ Mos 04/01/2015, 03/16/2016,  01/20/2017, 04/11/2018, 02/13/2019, 01/31/2020, 12/05/2020   Moderna Sars-Covid-2 Vaccination 12/13/2019, 01/11/2020   PNEUMOCOCCAL CONJUGATE-20 12/05/2020   Pneumococcal Polysaccharide-23 05/13/2015    TDAP status: Due, Education has been provided regarding the importance of this vaccine. Advised may receive this vaccine at local pharmacy or Health Dept. Aware to provide a copy of the vaccination record if obtained from local pharmacy or Health Dept. Verbalized acceptance and understanding.  Flu Vaccine status: Due, Education has been provided regarding the importance of this vaccine. Advised may receive this vaccine at local pharmacy or Health Dept. Aware to provide a copy of the vaccination record if obtained from local pharmacy or Health Dept. Verbalized acceptance and understanding.  Pneumococcal vaccine status: Up to date  Covid-19 vaccine status: Information provided on how to obtain vaccines.   Qualifies for Shingles Vaccine? Yes   Zostavax completed No   Shingrix Completed?: No.    Education has been provided regarding the importance of this vaccine. Patient has been advised to call insurance company to determine out of pocket expense if they have not yet received this vaccine. Advised may also receive vaccine at local pharmacy or Health Dept. Verbalized acceptance and  understanding.  Screening Tests Health Maintenance  Topic Date Due   DTaP/Tdap/Td (1 - Tdap) Never done   Zoster Vaccines- Shingrix (1 of 2) Never done   OPHTHALMOLOGY EXAM  10/16/2018   Cervical Cancer Screening (HPV/Pap Cotest)  07/16/2019   COVID-19 Vaccine (3 - Moderna risk series) 02/08/2020   INFLUENZA VACCINE  11/12/2022   FOOT EXAM  01/01/2023   Medicare Annual Wellness (AWV)  01/08/2023   HEMOGLOBIN A1C  05/14/2023   Diabetic kidney evaluation - eGFR measurement  07/15/2023   Diabetic kidney evaluation - Urine ACR  07/15/2023   MAMMOGRAM  05/26/2024   Colonoscopy  10/30/2025   Pneumonia Vaccine 88+  Years old  Completed   DEXA SCAN  Completed   Hepatitis C Screening  Completed   HIV Screening  Completed   HPV VACCINES  Aged Out    Health Maintenance  Health Maintenance Due  Topic Date Due   DTaP/Tdap/Td (1 - Tdap) Never done   Zoster Vaccines- Shingrix (1 of 2) Never done   OPHTHALMOLOGY EXAM  10/16/2018   Cervical Cancer Screening (HPV/Pap Cotest)  07/16/2019   COVID-19 Vaccine (3 - Moderna risk series) 02/08/2020   INFLUENZA VACCINE  11/12/2022   FOOT EXAM  01/01/2023   Medicare Annual Wellness (AWV)  01/08/2023    Colorectal cancer screening: Type of screening: Colonoscopy. Completed 10/31/2015. Repeat every 10 years  Mammogram status: Completed 05/26/2022. Repeat every year  Bone Density status: Completed 08/18/2022. Results reflect: Bone density results: NORMAL. Repeat every 5 years.  Lung Cancer Screening: (Low Dose CT Chest recommended if Age 80-80 years, 20 pack-year currently smoking OR have quit w/in 15years.) does not qualify.   Additional Screening:  Hepatitis C Screening: does not qualify; Completed 05/04/2017  Vision Screening: Recommended annual ophthalmology exams for early detection of glaucoma and other disorders of the eye. Is the patient up to date with their annual eye exam?  No  Who is the provider or what is the name of the office in which the patient attends annual eye exams? Patient will establish with Dr.Cotter at [facility] in [place] in January If pt is not established with a provider, would they like to be referred to a provider to establish care? No .   Dental Screening: Recommended annual dental exams for proper oral hygiene  Diabetic Foot Exam: Diabetic Foot Exam: Overdue, Pt has been advised about the importance in completing this exam. Pt is scheduled for diabetic foot exam on 02/02/2024.  Community Resource Referral / Chronic Care Management: CRR required this visit?  No   CCM required this visit?  No     Plan:     I have  personally reviewed and noted the following in the patient's chart:   Medical and social history Use of alcohol, tobacco or illicit drugs  Current medications and supplements including opioid prescriptions. Patient is not currently taking opioid prescriptions. Functional ability and status Nutritional status Physical activity Advanced directives List of other physicians Hospitalizations, surgeries, and ER visits in previous 12 months Vitals Screenings to include cognitive, depression, and falls Referrals and appointments  In addition, I have reviewed and discussed with patient certain preventive protocols, quality metrics, and best practice recommendations. A written personalized care plan for preventive services as well as general preventive health recommendations were provided to patient.     Jordan Hawks Toshi Ishii, CMA   02/01/2023   After Visit Summary: (Mail) Due to this being a telephonic visit, the after visit summary with patients  personalized plan was offered to patient via mail   Nurse Notes: see routing comment

## 2023-02-01 NOTE — Patient Instructions (Signed)
Tracey [name] , Thank you for taking time to come for your Medicare Wellness Visit. I appreciate your ongoing commitment to your health goals. Please review the following plan we discussed and let me know if I can assist you in the future.   Referrals/Orders/Follow-Ups/Clinician Recommendations:  Next Medicare Annual Wellness Visit: February 02, 2024 at 10:00 am virtual visit.  You are due for the vaccines checked below. You may have these done at your preferred pharmacy. Please have them fax the office proof of the vaccines so that we can update your chart.   [x]  Flu (due annually)  Recommended this fall either at PCP office or through your local pharmacy. The flu season starts August 1 of each year.   [x]  Shingrix (Shingles vaccine): CDC recommends 2 doses of Shingrix separated by 2-6 months for aged 64 years and older:  []  Pneumonia Vaccines: Recommended for adults 65 years or older  [x]  TDAP (Tetanus) Vaccine every 10 years:Recommended every 10 years; Please call your insurance company to determine your out of pocket expense. You also receive this vaccine at your local pharmacy or Health Dept.  [x]  Covid-19: Available now at any [facility] pharmacy (see info below)  You may also get your vaccines at any [facility] (locations listed below.) Vaccine hours are Monday - Friday 9:00 - 4:00. No appointments are required. Most insurances are accepted including Medicaid. Anyone can use the community pharmacies, and people are not required to have a [facility] provider.  Community Pharmacy Locations offering vaccines:   Sport and exercise psychologist   [facility]  10 vaccines are offered at the [employer]: Covid, flu, Tdap, shingles, RSV, pneumonia, meningococcal, hepatitis A, hepatitis B, and HPV.    This is a list of the screening recommended for you and due dates:  Health  Maintenance  Topic Date Due   DTaP/Tdap/Td vaccine (1 - Tdap) Never done   Zoster (Shingles) Vaccine (1 of 2) Never done   Eye exam for diabetics  10/16/2018   Pap with HPV screening  07/16/2019   COVID-19 Vaccine (3 - Moderna risk series) 02/08/2020   Flu Shot  11/12/2022   Complete foot exam   01/01/2023   Hemoglobin A1C  05/14/2023   Mammogram  05/27/2023   Yearly kidney function blood test for diabetes  07/15/2023   Yearly kidney health urinalysis for diabetes  07/15/2023   Medicare Annual Wellness Visit  02/01/2024   Colon Cancer Screening  10/30/2025   DEXA scan (bone density measurement)  08/18/2027   Pneumonia Vaccine  Completed   Hepatitis C Screening  Completed   HIV Screening  Completed   HPV Vaccine  Aged Out    Advanced directives: (Provided) Advance directive discussed with you today. I have provided a copy for you to complete at home and have notarized. Once this is complete, please bring a copy in to our office so we can scan it into your chart.   Next Medicare Annual Wellness Visit scheduled for next year: Yes  Preventive Care 61 Years and Older, Female Preventive care refers to lifestyle choices and visits with your health care provider that can promote health and wellness. Preventive care visits are also called wellness exams. What can I expect for my preventive care visit? Counseling Your health care provider may ask you questions about your: Medical history, including: Past medical problems. Family medical history. Pregnancy and menstrual  history. History of falls. Current health, including: Memory and ability to understand (cognition). Emotional well-being. Home life and relationship well-being. Sexual activity and sexual health. Lifestyle, including: Alcohol, nicotine or tobacco, and drug use. Access to firearms. Diet, exercise, and sleep habits. Work and work Astronomer. Sunscreen use. Safety issues such as seatbelt and bike helmet  use. Physical exam Your health care provider will check your: Height and weight. These may be used to calculate your BMI (body mass index). BMI is a measurement that tells if you are at a healthy weight. Waist circumference. This measures the distance around your waistline. This measurement also tells if you are at a healthy weight and may help predict your risk of certain diseases, such as type 2 diabetes and high blood pressure. Heart rate and blood pressure. Body temperature. Skin for abnormal spots. What immunizations do I need?  Vaccines are usually given at various ages, according to a schedule. Your health care provider will recommend vaccines for you based on your age, medical history, and lifestyle or other factors, such as travel or where you work. What tests do I need? Screening Your health care provider may recommend screening tests for certain conditions. This may include: Lipid and cholesterol levels. Hepatitis C test. Hepatitis B test. HIV (human immunodeficiency virus) test. STI (sexually transmitted infection) testing, if you are at risk. Lung cancer screening. Colorectal cancer screening. Diabetes screening. This is done by checking your blood sugar (glucose) after you have not eaten for a while (fasting). Mammogram. Talk with your health care provider about how often you should have regular mammograms. BRCA-related cancer screening. This may be done if you have a family history of breast, ovarian, tubal, or peritoneal cancers. Bone density scan. This is done to screen for osteoporosis. Talk with your health care provider about your test results, treatment options, and if necessary, the need for more tests. Follow these instructions at home: Eating and drinking  Eat a diet that includes fresh fruits and vegetables, whole grains, lean protein, and low-fat dairy products. Limit your intake of foods with high amounts of sugar, saturated fats, and salt. Take vitamin and  mineral supplements as recommended by your health care provider. Do not drink alcohol if your health care provider tells you not to drink. If you drink alcohol: Limit how much you have to 0-1 drink a day. Know how much alcohol is in your drink. In the [place] one drink equals one 12 oz bottle of beer (355 mL), one 5 oz glass of wine (148 mL), or one 1 oz glass of hard liquor (44 mL). Lifestyle Brush your teeth every morning and night with fluoride toothpaste. Floss one time each day. Exercise for at least 30 minutes 5 or more days each week. Do not use any products that contain nicotine or tobacco. These products include cigarettes, chewing tobacco, and vaping devices, such as e-cigarettes. If you need help quitting, ask your health care provider. Do not use drugs. If you are sexually active, practice safe sex. Use a condom or other form of protection in order to prevent STIs. Take aspirin only as told by your health care provider. Make sure that you understand how much to take and what form to take. Work with your health care provider to find out whether it is safe and beneficial for you to take aspirin daily. Ask your health care provider if you need to take a cholesterol-lowering medicine (statin). Find healthy ways to manage stress, such as: Meditation, yoga, or listening  to music. Journaling. Talking to a trusted person. Spending time with friends and family. Minimize exposure to UV radiation to reduce your risk of skin cancer. Safety Always wear your seat belt while driving or riding in a vehicle. Do not drive: If you have been drinking alcohol. Do not ride with someone who has been drinking. When you are tired or distracted. While texting. If you have been using any mind-altering substances or drugs. Wear a helmet and other protective equipment during sports activities. If you have firearms in your house, make sure you follow all gun safety procedures. What's next? Visit your  health care provider once a year for an annual wellness visit. Ask your health care provider how often you should have your eyes and teeth checked. Stay up to date on all vaccines. This information is not intended to replace advice given to you by your health care provider. Make sure you discuss any questions you have with your health care provider. Document Revised: 09/25/2020 Document Reviewed: 09/25/2020 Elsevier Patient Education  2024 [employer]. Understanding Your Risk for Falls Millions of people have serious injuries from falls each year. It is important to understand your risk of falling. Talk with your health care provider about your risk and what you can do to lower it. If you do have a serious fall, make sure to tell your provider. Falling once raises your risk of falling again. How can falls affect me? Serious injuries from falls are common. These include: Broken bones, such as hip fractures. Head injuries, such as traumatic brain injuries (TBI) or concussions. A fear of falling can cause you to avoid activities and stay at home. This can make your muscles weaker and raise your risk for a fall. What can increase my risk? There are a number of risk factors that increase your risk for falling. The more risk factors you have, the higher your risk of falling. Serious injuries from a fall happen most often to people who are older than 65 years old. Teenagers and young adults ages 18-29 are also at higher risk. Common risk factors include: Weakness in the lower body. Being generally weak or confused due to long-term (chronic) illness. Dizziness or balance problems. Poor vision. Medicines that cause dizziness or drowsiness. These may include: Medicines for your blood pressure, heart, anxiety, insomnia, or swelling (edema). Pain medicines. Muscle relaxants. Other risk factors include: Drinking alcohol. Having had a fall in the past. Having foot pain or wearing improper  footwear. Working at a dangerous job. Having any of the following in your home: Tripping hazards, such as floor clutter or loose rugs. Poor lighting. Pets. Having dementia or memory loss. What actions can I take to lower my risk of falling?     Physical activity Stay physically fit. Do strength and balance exercises. Consider taking a regular class to build strength and balance. Yoga and tai chi are good options. Vision Have your eyes checked every year and your prescription for glasses or contacts updated as needed. Shoes and walking aids Wear non-skid shoes. Wear shoes that have rubber soles and low heels. Do not wear high heels. Do not walk around the house in socks or slippers. Use a cane or walker as told by your provider. Home safety Attach secure railings on both sides of your stairs. Install grab bars for your bathtub, shower, and toilet. Use a non-skid mat in your bathtub or shower. Attach bath mats securely with double-sided, non-slip rug tape. Use good lighting in all rooms.  Keep a flashlight near your bed. Make sure there is a clear path from your bed to the bathroom. Use night-lights. Do not use throw rugs. Make sure all carpeting is taped or tacked down securely. Remove all clutter from walkways and stairways, including extension cords. Repair uneven or broken steps and floors. Avoid walking on icy or slippery surfaces. Walk on the grass instead of on icy or slick sidewalks. Use ice melter to get rid of ice on walkways in the winter. Use a cordless phone. Questions to ask your health care provider Can you help me check my risk for a fall? Do any of my medicines make me more likely to fall? Should I take a vitamin D supplement? What exercises can I do to improve my strength and balance? Should I make an appointment to have my vision checked? Do I need a bone density test to check for weak bones (osteoporosis)? Would it help to use a cane or a walker? Where to find  more information Centers for Disease Control and Prevention, [employer]: TonerPromos.no Community-Based Fall Prevention Programs: TonerPromos.no [employer] on Aging: BaseRingTones.pl Contact a health care provider if: You fall at home. You are afraid of falling at home. You feel weak, drowsy, or dizzy. This information is not intended to replace advice given to you by your health care provider. Make sure you discuss any questions you have with your health care provider. Document Revised: 12/01/2021 Document Reviewed: 12/01/2021 Elsevier Patient Education  2024 [employer]. Exercising to Stay Healthy To become healthy and stay healthy, it is recommended that you do moderate-intensity and vigorous-intensity exercise. You can tell that you are exercising at a moderate intensity if your heart starts beating faster and you start breathing faster but can still hold a conversation. You can tell that you are exercising at a vigorous intensity if you are breathing much harder and faster and cannot hold a conversation while exercising. How can exercise benefit me? Exercising regularly is important. It has many health benefits, such as: Improving overall fitness, flexibility, and endurance. Increasing bone density. Helping with weight control. Decreasing body fat. Increasing muscle strength and endurance. Reducing stress and tension, anxiety, depression, or anger. Improving overall health. What guidelines should I follow while exercising? Before you start a new exercise program, talk with your health care provider. Do not exercise so much that you hurt yourself, feel dizzy, or get very short of breath. Wear comfortable clothes and wear shoes with good support. Drink plenty of water while you exercise to prevent dehydration or heat stroke. Work out until your breathing and your heartbeat get faster (moderate intensity). How often should I exercise? Choose an activity that you enjoy, and set realistic goals. Your  health care provider can help you make an activity plan that is individually designed and works best for you. Exercise regularly as told by your health care provider. This may include: Doing strength training two times a week, such as: Lifting weights. Using resistance bands. Push-ups. Sit-ups. Yoga. Doing a certain intensity of exercise for a given amount of time. Choose from these options: A total of 150 minutes of moderate-intensity exercise every week. A total of 75 minutes of vigorous-intensity exercise every week. A mix of moderate-intensity and vigorous-intensity exercise every week. Children, pregnant women, people who have not exercised regularly, people who are overweight, and older adults may need to talk with a health care provider about what activities are safe to perform. If you have a medical condition, be sure to  talk with your health care provider before you start a new exercise program. What are some exercise ideas? Moderate-intensity exercise ideas include: Walking 1 mile (1.6 km) in about 15 minutes. Biking. Hiking. Golfing. Dancing. Water aerobics. Vigorous-intensity exercise ideas include: Walking 4.5 miles (7.2 km) or more in about 1 hour. Jogging or running 5 miles (8 km) in about 1 hour. Biking 10 miles (16.1 km) or more in about 1 hour. Lap swimming. Roller-skating or in-line skating. Cross-country skiing. Vigorous competitive sports, such as football, basketball, and soccer. Jumping rope. Aerobic dancing. What are some everyday activities that can help me get exercise? Yard work, such as: Child psychotherapist. Raking and bagging leaves. Washing your car. Pushing a stroller. Shoveling snow. Gardening. Washing windows or floors. How can I be more active in my day-to-day activities? Use stairs instead of an elevator. Take a walk during your lunch break. If you drive, park your car farther away from your work or school. If you take public  transportation, get off one stop early and walk the rest of the way. Stand up or walk around during all of your indoor phone calls. Get up, stretch, and walk around every 30 minutes throughout the day. Enjoy exercise with a friend. Support to continue exercising will help you keep a regular routine of activity. Where to find more information You can find more information about exercising to stay healthy from: [place]. Department of Health and Human Services: ThisPath.fi Centers for Disease Control and Prevention (CDC): FootballExhibition.com.br Summary Exercising regularly is important. It will improve your overall fitness, flexibility, and endurance. Regular exercise will also improve your overall health. It can help you control your weight, reduce stress, and improve your bone density. Do not exercise so much that you hurt yourself, feel dizzy, or get very short of breath. Before you start a new exercise program, talk with your health care provider. This information is not intended to replace advice given to you by your health care provider. Make sure you discuss any questions you have with your health care provider. Document Revised: 07/26/2020 Document Reviewed: 07/26/2020 Elsevier Patient Education  2024 Else[employer] Inc. Exercises to do While Sitting  Exercises that you do while sitting (chair exercises) can give you many of the same benefits as full exercise. Benefits include strengthening your heart, burning calories, and keeping muscles and joints healthy. Exercise can also improve your mood and help with depression and anxiety. You may benefit from chair exercises if you are unable to do standing exercises due to: Diabetic foot pain. Obesity. Illness. Arthritis. Recovery from surgery or injury. Breathing problems. Balance problems. Another type of disability. Before starting chair exercises, check with your health care provider or a physical therapist to find out how much exercise you can tolerate  and which exercises are safe for you. If your health care provider approves: Start out slowly and build up over time. Aim to work up to about 10-20 minutes for each exercise session. Make exercise part of your daily routine. Drink water when you exercise. Do not wait until you are thirsty. Drink every 10-15 minutes. Stop exercising right away if you have pain, nausea, shortness of breath, or dizziness. If you are exercising in a wheelchair, make sure to lock the wheels. Ask your health care provider whether you can do tai chi or yoga. Many positions in these mind-body exercises can be modified to do while seated. Warm-up Before starting other exercises: Sit up as straight as you can. Have your knees  bent at 90 degrees, which is the shape of the capital letter "L." Keep your feet flat on the floor. Sit at the front edge of your chair, if you can. Pull in (tighten) the muscles in your abdomen and stretch your spine and neck as straight as you can. Hold this position for a few minutes. Breathe in and out evenly. Try to concentrate on your breathing, and relax your mind. Stretching Exercise A: Arm stretch Hold your arms out straight in front of your body. Bend your hands at the wrist with your fingers pointing up, as if signaling someone to stop. Notice the slight tension in your forearms as you hold the position. Keeping your arms out and your hands bent, rotate your hands outward as far as you can and hold this stretch. Aim to have your thumbs pointing up and your pinkie fingers pointing down. Slowly repeat arm stretches for one minute as tolerated. Exercise B: Leg stretch If you can move your legs, try to "draw" letters on the floor with the toes of your foot. Write your name with one foot. Write your name with the toes of your other foot. Slowly repeat the movements for one minute as tolerated. Exercise C: Reach for the sky Reach your hands as far over your head as you can to stretch your  spine. Move your hands and arms as if you are climbing a rope. Slowly repeat the movements for one minute as tolerated. Range of motion exercises Exercise A: Shoulder roll Let your arms hang loosely at your sides. Lift just your shoulders up toward your ears, then let them relax back down. When your shoulders feel loose, rotate your shoulders in backward and forward circles. Do shoulder rolls slowly for one minute as tolerated. Exercise B: March in place As if you are marching, pump your arms and lift your legs up and down. Lift your knees as high as you can. If you are unable to lift your knees, just pump your arms and move your ankles and feet up and down. March in place for one minute as tolerated. Exercise C: Seated jumping jacks Let your arms hang down straight. Keeping your arms straight, lift them up over your head. Aim to point your fingers to the ceiling. While you lift your arms, straighten your legs and slide your heels along the floor to your sides, as wide as you can. As you bring your arms back down to your sides, slide your legs back together. If you are unable to use your legs, just move your arms. Slowly repeat seated jumping jacks for one minute as tolerated. Strengthening exercises Exercise A: Shoulder squeeze Hold your arms straight out from your body to your sides, with your elbows bent and your fists pointed at the ceiling. Keeping your arms in the bent position, move them forward so your elbows and forearms meet in front of your face. Open your arms back out as wide as you can with your elbows still bent, until you feel your shoulder blades squeezing together. Hold for 5 seconds. Slowly repeat the movements forward and backward for one minute as tolerated. Contact a health care provider if: You have to stop exercising due to any of the following: Pain. Nausea. Shortness of breath. Dizziness. Fatigue. You have significant pain or soreness after exercising. Get  help right away if: You have chest pain. You have difficulty breathing. These symptoms may represent a serious problem that is an emergency. Do not wait to see if the  symptoms will go away. Get medical help right away. Call your local emergency services (911 in the [place] Do not drive yourself to the hospital. Summary Exercises that you do while sitting (chair exercises) can strengthen your heart, burn calories, and keep muscles and joints healthy. You may benefit from chair exercises if you are unable to do standing exercises due to diabetic foot pain, obesity, recovery from surgery or injury, or other conditions. Before starting chair exercises, check with your health care provider or a physical therapist to find out how much exercise you can tolerate and which exercises are safe for you. This information is not intended to replace advice given to you by your health care provider. Make sure you discuss any questions you have with your health care provider. Document Revised: 05/26/2020 Document Reviewed: 05/26/2020 Elsevier Patient Education  2024 [employer].

## 2023-02-02 ENCOUNTER — Encounter: Payer: Self-pay | Admitting: Podiatry

## 2023-02-02 ENCOUNTER — Ambulatory Visit (INDEPENDENT_AMBULATORY_CARE_PROVIDER_SITE_OTHER): Payer: Medicare Other | Admitting: Podiatry

## 2023-02-02 DIAGNOSIS — E1142 Type 2 diabetes mellitus with diabetic polyneuropathy: Secondary | ICD-10-CM | POA: Diagnosis not present

## 2023-02-02 DIAGNOSIS — M79674 Pain in right toe(s): Secondary | ICD-10-CM | POA: Diagnosis not present

## 2023-02-02 DIAGNOSIS — M79675 Pain in left toe(s): Secondary | ICD-10-CM

## 2023-02-02 DIAGNOSIS — B351 Tinea unguium: Secondary | ICD-10-CM | POA: Diagnosis not present

## 2023-02-02 NOTE — Progress Notes (Signed)
  Subjective:  Patient ID: Tracey [name], female    DOB: Jul 04, 1955,   MRN: [id number]  Chief Complaint  Patient presents with   [facility]    RM#21 Patient states she is here for diabetic foot care numbness in both feet at times. 11/11/2022 A1C 7.1 follow up with PCP 12/24    67 y.o. female presents for concern of thickened elongated and painful nails that are difficult to trim. Requesting to have them trimmed today. Relates burning and tingling in their feet. Patient is diabetic and last A1c was  Lab Results  Component Value Date   HGBA1C 7.1 (A) 11/11/2022   .   PCP:  Kerri Perches, MD    . Denies any other pedal complaints. Denies n/v/f/c.   Past Medical History:  Diagnosis Date   Arthritis    Diabetes mellitus without complication (HCC)    Diastolic dysfunction 10/2009   Grade 1   GERD (gastroesophageal reflux disease)    Hyperlipidemia    Hyperlipidemia    Hypertension 04/15/2012   Morbid obesity (HCC)    PE (pulmonary embolism)    Pulmonary emboli (HCC) 1999   post TAH   Sleep apnea     Objective:  Physical Exam: Vascular: DP/PT pulses 2/4 bilateral. CFT <3 seconds. Absent hair growth on digits. Edema noted to bilateral lower extremities. Xerosis noted bilaterally.  Skin. No lacerations or abrasions bilateral feet. Nails 1-5 bilateral  are thickened discolored and elongated with subungual debris.  Musculoskeletal: MMT 5/5 bilateral lower extremities in DF, PF, Inversion and Eversion. Deceased ROM in DF of ankle joint.  Neurological: Sensation intact to light touch. Protective sensation diminished bilateral.     Assessment:   1. Pain due to onychomycosis of toenails of both feet   2. Diabetic peripheral neuropathy associated with type 2 diabetes mellitus (HCC)      Plan:  Patient was evaluated and treated and all questions answered. -Discussed and educated patient on diabetic foot care, especially with  regards to the vascular, neurological and  musculoskeletal systems.  -Stressed the importance of good glycemic control and the detriment of not  controlling glucose levels in relation to the foot. -Discussed supportive shoes at all times and checking feet regularly.  -Mechanically debrided all nails 1-5 bilateral using sterile nail nipper and filed with dremel without incident  -Answered all patient questions -Patient to return  in 3 months for at risk foot care -Patient advised to call the office if any problems or questions arise in the meantime.   Louann Sjogren, DPM

## 2023-02-09 ENCOUNTER — Encounter: Payer: Self-pay | Admitting: *Deleted

## 2023-02-09 ENCOUNTER — Ambulatory Visit: Payer: Self-pay | Admitting: *Deleted

## 2023-02-09 DIAGNOSIS — E1169 Type 2 diabetes mellitus with other specified complication: Secondary | ICD-10-CM

## 2023-02-09 NOTE — Patient Outreach (Signed)
Care Coordination   Health Equity Plan Follow Up Visit Note   02/09/2023 Name: Tracey [name] MRN: [id number] DOB: 28-Feb-1956  Tracey [name] is a 67 y.o. year old female who sees Kerri Perches, MD for primary care. I spoke with  Tracey [name] by phone today.  What matters to the patients health and wellness today?  Managing blood sugar and finding food resources    Goals Addressed             This Visit's Progress    Decrease A1C to <7% over the Next 6 Months   On track    Care Coordination Goals: Patient will follow-up with endocrinologist every 4 months or as recommended Tracey Bacon, NP (endocrinologist) 03/15/23 Patient will monitor blood sugar with Freestyle Libre Continuous Glucose Monitor 4 times per day and as needed  Patient will follow a modified carbohydrate diet.  Eat 3 meals a day with 30GM of carbohydrates and up to 2 snacks a day with less than 15GM of carbohydrates.  Patient will talk with community care guides regarding food insecurity Patient will upgrade to a continuous glucose monitor with alarms when Medicare will cover the cost of the monitor (covered Libre system in 2020) Patient will increase physical activity level as tolerated with an ultimate goal of at least 150 minutes of moderate activity per week Patient will reach out to RN Care Coordinator [phone] with any care coordination or resource needs      Schedule an Annual Eye Exam within the next 60 days   On track    Care Coordination Goals: Patient will schedule an annual diabetic eye exam within the next 60 days Patient will state understanding of the importance for diabetic eye exams and eye care Patient will reach out to RN Care Coordinator at [phone] with any resource or care coordination needs              SDOH assessments and interventions completed:  Yes  SDOH Interventions Today    Flowsheet Row Most Recent Value  SDOH Interventions   Food  Insecurity Interventions AMB Referral  Transportation Interventions Patient Resources (Friends/Family), Intervention Not Indicated        Care Coordination Interventions:  Yes, provided  Interventions Today    Flowsheet Row Most Recent Value  Chronic Disease   Chronic disease during today's visit Diabetes  General Interventions   General Interventions Discussed/Reviewed General Interventions Discussed, General Interventions Reviewed, Doctor Visits, Labs, [facility], Horticulturist, commercial (DME)  Labs Hgb A1c every 3 months  Doctor Visits Discussed/Reviewed Doctor Visits Discussed, Doctor Visits Reviewed, Annual Wellness Visits, PCP, Database administrator (DME) Glucomoter  [continuous glucose monitor]  PCP/Specialist Visits Compliance with follow-up visit  [Dr Lodema Hong on 03/04/23. Tracey Bacon, NP (endocrinologist) 03/15/23]  Exercise Interventions   Exercise Discussed/Reviewed Exercise Discussed, Exercise Reviewed, Physical Activity, Assistive device use and maintanence  Physical Activity Discussed/Reviewed Physical Activity Discussed, Physical Activity Reviewed, Types of exercise  [encouraged to increase phyiscal activity level as tolerated with a goal of 150 minutes per week]  Education Interventions   Education Provided Provided Education  Provided Verbal Education On Nutrition, Blood Sugar Monitoring, When to see the doctor, [facility], Medication, Exercise  [place] information on local food pantries. [id number] placed. She has food for now but is often concerned about running out. Receives $78 per month in food stamps.]  Nutrition Interventions   Nutrition Discussed/Reviewed Nutrition Discussed, Nutrition Reviewed, Carbohydrate meal planning, Portion sizes, Fluid intake, Adding  fruits and vegetables, Decreasing sugar intake, Increasing proteins  [Eat 3 meals per day with 30 GM of CHO and up to 2 snacks per day, if needed, with less than 15 GM of CHO]   Pharmacy Interventions   Pharmacy Dicussed/Reviewed Pharmacy Topics Discussed, Pharmacy Topics Reviewed, Medications and their functions  [taking medication as prescribed. No questions or concerns.]  Safety Interventions   Safety Discussed/Reviewed Safety Discussed, Safety Reviewed, Fall Risk, Home Safety  Home Safety Assistive Devices  Advanced Directive Interventions   Advanced Directives Discussed/Reviewed Advanced Directives Discussed, Advanced Care Planning  [encouraged to consider]       Follow up plan: Follow up call scheduled for 02/23/23    Encounter Outcome:  Patient Visit Completed   Tracey Loll, RN, BSN Care Management Coordinator [facility]  [facility] Direct Dial: [phone] Main #: [phone]

## 2023-02-10 ENCOUNTER — Telehealth: Payer: Self-pay

## 2023-02-10 NOTE — Telephone Encounter (Signed)
   Telephone encounter was:  Unsuccessful.  02/10/2023 Name: REDITH MACIA MRN: [id number] DOB: 1955/04/21  Unsuccessful outbound call made today to assist with:  Food Insecurity  Outreach Attempt:  1st Attempt  A HIPAA compliant voice message was left requesting a return call.  Instructed patient to call back .    Lenard Forth [place]  [facility], [facility] Guide, Phone: [phone] Website: [email or web address]

## 2023-02-15 ENCOUNTER — Telehealth: Payer: Self-pay

## 2023-02-15 NOTE — Telephone Encounter (Signed)
   Telephone encounter was:  Unsuccessful.  02/15/2023 Name: EVELYNNE SPIERS MRN: [id number] DOB: 1955/04/26  Unsuccessful outbound call made today to assist with:  Food Insecurity  Outreach Attempt:  2nd Attempt  A HIPAA compliant voice message was left requesting a return call.  Instructed patient to call back .    Lenard Forth [place]  [facility], [facility] Guide, Phone: [phone] Website: [email or web address]

## 2023-02-17 ENCOUNTER — Telehealth: Payer: Self-pay

## 2023-02-17 NOTE — Telephone Encounter (Signed)
   Telephone encounter was:  Unsuccessful.  02/17/2023 Name: KEELIA GRAYBILL MRN: [id number] DOB: [birth year]  Unsuccessful outbound call made today to assist with:  Food Insecurity  Outreach Attempt:  3rd Attempt.  Referral closed unable to contact patient.  A HIPAA compliant voice message was left requesting a return call.  Instructed patient to call back .   Lenard Forth [place]  [facility], [facility] Guide, Phone: [phone] Website: [email or web address]

## 2023-02-23 ENCOUNTER — Encounter: Payer: Self-pay | Admitting: *Deleted

## 2023-02-23 ENCOUNTER — Ambulatory Visit: Payer: Self-pay | Admitting: *Deleted

## 2023-03-04 ENCOUNTER — Encounter: Payer: Self-pay | Admitting: Family Medicine

## 2023-03-04 ENCOUNTER — Ambulatory Visit: Payer: Medicare Other | Admitting: Family Medicine

## 2023-03-04 VITALS — BP 118/70 | HR 133 | Ht 70.0 in | Wt 350.1 lb

## 2023-03-04 DIAGNOSIS — Z1231 Encounter for screening mammogram for malignant neoplasm of breast: Secondary | ICD-10-CM

## 2023-03-04 DIAGNOSIS — E782 Mixed hyperlipidemia: Secondary | ICD-10-CM | POA: Diagnosis not present

## 2023-03-04 DIAGNOSIS — Z23 Encounter for immunization: Secondary | ICD-10-CM | POA: Diagnosis not present

## 2023-03-04 DIAGNOSIS — I5189 Other ill-defined heart diseases: Secondary | ICD-10-CM

## 2023-03-04 DIAGNOSIS — E669 Obesity, unspecified: Secondary | ICD-10-CM | POA: Diagnosis not present

## 2023-03-04 DIAGNOSIS — I10 Essential (primary) hypertension: Secondary | ICD-10-CM

## 2023-03-04 DIAGNOSIS — E1169 Type 2 diabetes mellitus with other specified complication: Secondary | ICD-10-CM

## 2023-03-04 NOTE — Patient Instructions (Addendum)
F/u in end May, call if you need me sooner  Flu vaccine today  Pls get covid , RSV and Tdap vaccines over the next aprox 6 weeks  None of the medication you are on is specifically causing hair loss  Use tylenol for headache  Labs today cBC, lipid, cmp and eGFR  Please schedule mammogram due 2/14/or after at checkout   Please examine youyr feet every day and wear shoes all the time, you have no sensation in your feet which increases your risk of getting an infection in your feet if you get a cut as you may not feel it Please get a mirror to help you to see your feet and ask someone tio check them for you if possible  Careful not tofall. Continue with weight loss and improved blood sugar control  Thanks for choosing [facility], we consider it a privelige to serve you.

## 2023-03-04 NOTE — Patient Outreach (Signed)
Care Coordination   Health Equity Plan Follow Up Visit Note   02/23/2023 Name: Tracey [name] MRN: [id number] DOB: 07-17-1955  Tracey [name] is a 66 y.o. year old female who sees Kerri Perches, MD for primary care. I spoke with  Tracey [name] by phone today.  What matters to the patients health and wellness today?  Managing diabetes and limiting complications    Goals Addressed             This Visit's Progress    Decrease A1C to <7% over the Next 6 Months   On track    Care Coordination Goals: Patient will follow-up with endocrinologist every 4 months or as recommended [name], NP (endocrinologist) 03/15/23 Patient will monitor blood sugar with Freestyle Libre Continuous Glucose Monitor 4 times per day and as needed  Patient will follow a modified carbohydrate diet.  Eat 3 meals a day with 30GM of carbohydrates and up to 2 snacks a day with less than 15GM of carbohydrates.  Patient will review handout on Diabetes & Nutrition Patient will return call to community care guides regarding food insecurity Patient will upgrade to a continuous glucose monitor with alarms when Medicare will cover the cost of the monitor (covered Libre system in 2020) Patient will increase physical activity level as tolerated with an ultimate goal of at least 150 minutes of moderate activity per week Patient will review handouts on seated exercises and Diabetes & Exercise Patient will reach out to RN Care Coordinator [phone] with any care coordination or resource needs      Over the next 3 Months Patient will Monitor Blood Pressure Daily   On track    Care Coordination Goals: Patient will monitor and record blood pressure daily and as needed and will call PCP or specialist with any readings outside of recommended range Patient will take blood pressure log to PCP and specialty appointments for review Patient will state understanding of need to monitor blood pressure  regularly Patient will reach out to RN Care Coordinator [phone] with any care coordination or resource needs        COMPLETED: Schedule an Annual Eye Exam within the next 60 days       Care Coordination Goals: Patient will schedule an annual diabetic eye exam within the next 60 days Patient will state understanding of the importance for diabetic eye exams and eye care Patient will reach out to RN Care Coordinator at [phone] with any resource or care coordination needs              SDOH assessments and interventions completed:  Yes  SDOH Interventions Today    Flowsheet Row Most Recent Value  SDOH Interventions   Food Insecurity Interventions AMB Referral, Other (Comment)  [Care Guides were unable to reach patient. Provided patient with contact number and asked that she return their call.]  Transportation Interventions Patient Resources (Friends/Family)  Physical Activity Interventions Other (Comments)  [provided with printed educational materials on seated exercises and diabetes & exercise]        Care Coordination Interventions:  Yes, provided  Interventions Today    Flowsheet Row Most Recent Value  Chronic Disease   Chronic disease during today's visit Diabetes, Hypertension (HTN)  General Interventions   General Interventions Discussed/Reviewed General Interventions Discussed, General Interventions Reviewed, Annual Eye Exam, Labs, Annual Foot Exam, Durable Medical Equipment (DME), Doctor Visits, Community Resources  Labs Hgb A1c every 3 months  Doctor Visits Discussed/Reviewed Doctor Visits Discussed, Doctor  Visits Reviewed, Annual Wellness Visits, PCP, Specialist  Durable Medical Equipment (DME) Other, BP Cuff  [continuous glucose monitor. Glucose 149 this morning. It did go up to 318 on 11/8 but she isn't sure why. It came down within a couple of hours.]  PCP/Specialist Visits Compliance with follow-up visit  [Dr Lodema Hong on 03/04/23. [name], NP  (endocrinologist) 03/15/23]  Exercise Interventions   Exercise Discussed/Reviewed Exercise Discussed, Physical Activity, Exercise Reviewed, Assistive device use and maintanence  Physical Activity Discussed/Reviewed Physical Activity Discussed, Physical Activity Reviewed  [encouraged increased physical activity level as tolerated with a goal of 150 minutes per week. Pt is limited by weakness in back and legs and her legs give out if she is up too long. Review information provided on chair exercises.]  Education Interventions   Education Provided Provided Printed Education, Provided Education  [printed information on seated exercises]  Provided Verbal Education On [facility], Nutrition, Foot Care, Eye Care, Blood Sugar Monitoring, When to see the doctor, Exercise, Medication  Nutrition Interventions   Nutrition Discussed/Reviewed Nutrition Discussed, Nutrition Reviewed, Carbohydrate meal planning, Adding fruits and vegetables, Fluid intake, Portion sizes, Decreasing sugar intake, Increasing proteins  [Eat 3 meals per day with 30 GM of CHO and up to 2 snacks per day, if needed, with less than 15 GM of CHO. Review handout on diet.]  Pharmacy Interventions   Pharmacy Dicussed/Reviewed Pharmacy Topics Discussed, Pharmacy Topics Reviewed, Medications and their functions  [taking medications as directed with no questions or concerns]  Safety Interventions   Safety Discussed/Reviewed Safety Discussed, Fall Risk, Safety Reviewed, Home Safety  [no falls since last call but patient feels like she might fall at times due to leg weakness. Use cane or walker for ambulation.]  Home Safety Assistive Devices  [consider physical therapy for strength and gait training]       Follow up plan: Follow up call scheduled for 03/25/23    Encounter Outcome:  Patient Visit Completed   Demetrios Loll, RN, BSN Care Management Coordinator [facility]  [facility] Direct Dial: [phone] Main #: [phone]

## 2023-03-05 LAB — CBC
Hematocrit: 43.1 % (ref 34.0–46.6)
Hemoglobin: 13.9 g/dL (ref 11.1–15.9)
MCH: 24.5 pg — ABNORMAL LOW (ref 26.6–33.0)
MCHC: 32.3 g/dL (ref 31.5–35.7)
MCV: 76 fL — ABNORMAL LOW (ref 79–97)
Platelets: 347 10*3/uL (ref 150–450)
RBC: 5.67 x10E6/uL — ABNORMAL HIGH (ref 3.77–5.28)
RDW: 17 % — ABNORMAL HIGH (ref 11.7–15.4)
WBC: 11.1 10*3/uL — ABNORMAL HIGH (ref 3.4–10.8)

## 2023-03-05 LAB — LIPID PANEL
Chol/HDL Ratio: 4.3 ratio (ref 0.0–4.4)
Cholesterol, Total: 165 mg/dL (ref 100–199)
HDL: 38 mg/dL — ABNORMAL LOW (ref >39)
LDL Chol Calc (NIH): 105 mg/dL — ABNORMAL HIGH (ref 0–99)
Triglycerides: 123 mg/dL (ref 0–149)
VLDL Cholesterol Cal: 22 mg/dL (ref 5–40)

## 2023-03-05 LAB — CMP14+EGFR
ALT: 33 [IU]/L — ABNORMAL HIGH (ref 0–32)
AST: 32 [IU]/L (ref 0–40)
Albumin: 4.6 g/dL (ref 3.9–4.9)
Alkaline Phosphatase: 74 [IU]/L (ref 44–121)
BUN/Creatinine Ratio: 11 — ABNORMAL LOW (ref 12–28)
BUN: 10 mg/dL (ref 8–27)
Bilirubin Total: 0.7 mg/dL (ref 0.0–1.2)
CO2: 24 mmol/L (ref 20–29)
Calcium: 9.8 mg/dL (ref 8.7–10.3)
Chloride: 97 mmol/L (ref 96–106)
Creatinine, Ser: 0.87 mg/dL (ref 0.57–1.00)
Globulin, Total: 3.5 g/dL (ref 1.5–4.5)
Glucose: 187 mg/dL — ABNORMAL HIGH (ref 70–99)
Potassium: 4.5 mmol/L (ref 3.5–5.2)
Sodium: 138 mmol/L (ref 134–144)
Total Protein: 8.1 g/dL (ref 6.0–8.5)
eGFR: 74 mL/min/{1.73_m2} (ref >59)

## 2023-03-15 ENCOUNTER — Ambulatory Visit: Payer: Medicare Other | Admitting: Nurse Practitioner

## 2023-03-15 DIAGNOSIS — E782 Mixed hyperlipidemia: Secondary | ICD-10-CM

## 2023-03-15 DIAGNOSIS — Z794 Long term (current) use of insulin: Secondary | ICD-10-CM

## 2023-03-15 DIAGNOSIS — E119 Type 2 diabetes mellitus without complications: Secondary | ICD-10-CM

## 2023-03-15 DIAGNOSIS — I10 Essential (primary) hypertension: Secondary | ICD-10-CM

## 2023-03-15 DIAGNOSIS — E559 Vitamin D deficiency, unspecified: Secondary | ICD-10-CM

## 2023-03-15 DIAGNOSIS — Z7985 Long-term (current) use of injectable non-insulin antidiabetic drugs: Secondary | ICD-10-CM

## 2023-03-15 DIAGNOSIS — Z7984 Long term (current) use of oral hypoglycemic drugs: Secondary | ICD-10-CM

## 2023-03-17 ENCOUNTER — Encounter: Payer: Self-pay | Admitting: Nurse Practitioner

## 2023-03-17 ENCOUNTER — Other Ambulatory Visit: Payer: Self-pay

## 2023-03-17 ENCOUNTER — Ambulatory Visit (INDEPENDENT_AMBULATORY_CARE_PROVIDER_SITE_OTHER): Payer: Medicare Other | Admitting: Nurse Practitioner

## 2023-03-17 VITALS — BP 118/83 | HR 109 | Ht 70.0 in | Wt 359.8 lb

## 2023-03-17 DIAGNOSIS — Z7984 Long term (current) use of oral hypoglycemic drugs: Secondary | ICD-10-CM | POA: Diagnosis not present

## 2023-03-17 DIAGNOSIS — Z7985 Long-term (current) use of injectable non-insulin antidiabetic drugs: Secondary | ICD-10-CM

## 2023-03-17 DIAGNOSIS — E119 Type 2 diabetes mellitus without complications: Secondary | ICD-10-CM | POA: Diagnosis not present

## 2023-03-17 DIAGNOSIS — I10 Essential (primary) hypertension: Secondary | ICD-10-CM | POA: Diagnosis not present

## 2023-03-17 DIAGNOSIS — Z794 Long term (current) use of insulin: Secondary | ICD-10-CM | POA: Diagnosis not present

## 2023-03-17 DIAGNOSIS — E782 Mixed hyperlipidemia: Secondary | ICD-10-CM | POA: Diagnosis not present

## 2023-03-17 DIAGNOSIS — E559 Vitamin D deficiency, unspecified: Secondary | ICD-10-CM

## 2023-03-17 LAB — POCT GLYCOSYLATED HEMOGLOBIN (HGB A1C): Hemoglobin A1C: 7.7 % — AB (ref 4.0–5.6)

## 2023-03-17 MED ORDER — FREESTYLE LIBRE 3 READER DEVI: 1.0000 | Freq: Once | 0 refills | Status: AC

## 2023-03-17 MED ORDER — SEMAGLUTIDE (2 MG/DOSE) 8 MG/3ML ~~LOC~~ SOPN: 2.0000 mg | PEN_INJECTOR | SUBCUTANEOUS | 1 refills | Status: DC

## 2023-03-17 MED ORDER — ACCU-CHEK GUIDE TEST VI STRP: ORAL_STRIP | 1 refills | Status: AC

## 2023-03-17 MED ORDER — FREESTYLE LIBRE 3 PLUS SENSOR MISC: 3 refills | Status: DC

## 2023-03-17 MED ORDER — ACCU-CHEK GUIDE TEST VI STRP: ORAL_STRIP | 12 refills | Status: DC

## 2023-03-17 NOTE — Progress Notes (Signed)
03/17/2023, 11:23 AM   Endocrinology Follow Up Visit    Subjective:    Patient ID: Tracey [name], female    DOB: [birth year].  Tracey [name] is being engaged in follow-up for management of currently uncontrolled symptomatic type 2 diabetes, hyperlipidemia, hypertension.    PMD:   Kerri Perches, MD.  Past Medical History:  Diagnosis Date   Arthritis    Diabetes mellitus without complication (HCC)    Diastolic dysfunction 10/2009   Grade 1   GERD (gastroesophageal reflux disease)    Hyperlipidemia    Hyperlipidemia    Hypertension 04/15/2012   Morbid obesity (HCC)    PE (pulmonary embolism)    Pulmonary emboli (HCC) 1999   post TAH   Sleep apnea    Past Surgical History:  Procedure Laterality Date   ABDOMINAL HYSTERECTOMY  1999   , totalfor fibroids   CESAREAN SECTION     CHOLECYSTECTOMY  1996   COLONOSCOPY N/A 10/31/2015   Procedure: COLONOSCOPY;  Surgeon: Malissa Hippo, MD;  Location: [facility];  Service: Endoscopy;  Laterality: N/A;  930   FOOT SURGERY Right    October 2022   Social History   Socioeconomic History   Marital status: Single    Spouse name: Not on file   Number of children: Not on file   Years of education: Not on file   Highest education level: Not on file  Occupational History   Not on file  Tobacco Use   Smoking status: Never   Smokeless tobacco: Never  Vaping Use   Vaping status: Never Used  Substance and Sexual Activity   Alcohol use: No   Drug use: No   Sexual activity: Not Currently  Other Topics Concern   Not on file  Social History Narrative   Not on file   Social Determinants of Health   Financial Resource Strain: Low Risk  (02/01/2023)   Overall Financial Resource Strain (CARDIA)    Difficulty of Paying Living Expenses: Not very hard  Food Insecurity: Food Insecurity Present (02/23/2023)    Hunger Vital Sign    Worried About Running Out of Food in the Last Year: Sometimes true    Ran Out of Food in the Last Year: Sometimes true  Transportation Needs: No Transportation Needs (02/23/2023)   PRAPARE - Administrator, Civil Service (Medical): No    Lack of Transportation (Non-Medical): No  Physical Activity: Inactive (02/23/2023)   Exercise Vital Sign    Days of Exercise per Week: 0 days    Minutes of Exercise per Session: 0 min  Stress: No Stress Concern Present (02/01/2023)   [employer] of Occupational Health - Occupational Stress Questionnaire    Feeling of Stress : Only a little  Social Connections: Moderately Isolated (02/01/2023)   Social Connection and Isolation Panel [NHANES]    Frequency of Communication with Friends and Family: More than three times a week  Frequency of Social Gatherings with Friends and Family: More than three times a week    Attends Religious Services: More than 4 times per year    Active Member of Clubs or Organizations: No    Attends Banker Meetings: Never    Marital Status: Divorced   Outpatient Encounter Medications as of 03/17/2023  Medication Sig   acetaminophen (TYLENOL) 500 MG tablet Take 500 mg by mouth every 6 (six) hours as needed for moderate pain.   aspirin EC 81 MG tablet Take 1 tablet (81 mg total) by mouth daily.   blood glucose meter kit and supplies KIT Dispense based on patient and insurance preference. Use up to four times daily as directed.   Continuous Glucose Receiver (FREESTYLE LIBRE 3 READER) DEVI 1 Device by Does not apply route once for 1 dose.   Continuous Glucose Sensor (FREESTYLE LIBRE 3 PLUS SENSOR) MISC Change sensor every 15 days.   glucose blood (ACCU-CHEK GUIDE TEST) test strip Use as instructed to monitor glucose 4 times daily- use as backup for CGM   insulin regular human CONCENTRATED (HUMULIN R U-500 KWIKPEN) 500 UNIT/ML KwikPen Inject 34 Units into the skin 3 (three) times  daily with meals.   Lancets MISC Use to test BG qid. Dx : E11.65. Softclix Lancets   metFORMIN (GLUCOPHAGE) 1000 MG tablet Take 1 tablet (1,000 mg total) by mouth 2 (two) times daily with a meal.   metoprolol tartrate (LOPRESSOR) 50 MG tablet TAKE ONE TABLET (50MG  TOTAL) BY MOUTH TWO TIMES DAILY   mupirocin ointment (BACTROBAN) 2 % Apply to affected area once daily.   olmesartan (BENICAR) 20 MG tablet TAKE ONE TABLET (20MG  TOTAL) BY MOUTH DAILY   omeprazole (PRILOSEC) 20 MG capsule TAKE ONE CAPSULE BY MOUTH DAILY   rosuvastatin (CRESTOR) 20 MG tablet TAKE ONE TABLET (20MG  TOTAL) BY MOUTH DAILY   spironolactone (ALDACTONE) 25 MG tablet TAKE ONE (1) TABLET BY MOUTH EVERY DAY   UNIFINE PENTIPS 31G X 8 MM MISC USE FOR INSULIN INJECTIONS THREE TIMES DAILY.   [DISCONTINUED] Continuous Glucose Sensor (FREESTYLE LIBRE 14 DAY SENSOR) MISC Inject 1 each into the skin every 14 (fourteen) days. Use as directed.   [DISCONTINUED] glucose blood (ACCU-CHEK GUIDE) test strip USE 1 STRIP TO CHECK GLUCOSE 3 TIMES DAILY AS DIRECTED   [DISCONTINUED] Semaglutide, 2 MG/DOSE, 8 MG/3ML SOPN Inject 2 mg as directed once a week.   Semaglutide, 2 MG/DOSE, 8 MG/3ML SOPN Inject 2 mg as directed once a week.   No facility-administered encounter medications on file as of 03/17/2023.    ALLERGIES: Allergies  Allergen Reactions   Haloperidol Lactate Anaphylaxis   Lipitor [Atorvastatin Calcium] Other (See Comments)    Markedly elevated liver enzymes    VACCINATION STATUS: Immunization History  Administered Date(s) Administered   Fluad Trivalent(High Dose 65+) 03/04/2023   Influenza,inj,Quad PF,6+ Mos 04/01/2015, 03/16/2016, 01/20/2017, 04/11/2018, 02/13/2019, 01/31/2020, 12/05/2020   Moderna Sars-Covid-2 Vaccination 12/13/2019, 01/11/2020   PNEUMOCOCCAL CONJUGATE-20 12/05/2020   Pneumococcal Polysaccharide-23 05/13/2015    Diabetes She presents for her follow-up diabetic visit. She has type 2 diabetes mellitus.  Onset time: He was diagnosed at approximate age of 36 years. Her disease course has been improving. There are no hypoglycemic associated symptoms. Pertinent negatives for hypoglycemia include no confusion, headaches, pallor or seizures. Associated symptoms include fatigue. Pertinent negatives for diabetes include no blurred vision, no chest pain, no polydipsia, no polyphagia and no polyuria. There are no hypoglycemic complications. Symptoms are stable. Diabetic complications include heart  disease and nephropathy. Risk factors for coronary artery disease include diabetes mellitus, dyslipidemia, family history, hypertension, obesity, post-menopausal and sedentary lifestyle. Current diabetic treatment includes intensive insulin program and oral agent (monotherapy) (and Ozempic). She is compliant with treatment most of the time. Her weight is fluctuating minimally. She is following a generally healthy diet. When asked about meal planning, she reported none. She has had a previous visit with a dietitian. She never participates in exercise. Her home blood glucose trend is decreasing steadily. Her overall blood glucose range is 140-180 mg/dl. (She presents today with her CGM and logs showing at target glycemic profile overall.  Her POCT A1c today is 7.7%, increasing slightly from last visit of 7.1%.  Analysis of her CGM shows TIR 89%, TAR 11%, TBR 0%.  She denies any hypoglycemia.  She has been injecting 35 units of U500 TID instead of lowering to 25 as recommended last visit.  ) An ACE inhibitor/angiotensin II receptor blocker is not being taken. She does not see a podiatrist.Eye exam is not current.  Hyperlipidemia This is a chronic problem. The current episode started more than 1 year ago. The problem is uncontrolled. Recent lipid tests were reviewed and are high. Exacerbating diseases include chronic renal disease, diabetes and obesity. Factors aggravating her hyperlipidemia include fatty foods. Pertinent negatives  include no chest pain, myalgias or shortness of breath. Current antihyperlipidemic treatment includes statins. The current treatment provides moderate improvement of lipids. Compliance problems include adherence to diet and adherence to exercise.  Risk factors for coronary artery disease include diabetes mellitus, dyslipidemia, hypertension, obesity, a sedentary lifestyle and post-menopausal.  Hypertension This is a chronic problem. The current episode started more than 1 year ago. The problem is unchanged. The problem is uncontrolled. Pertinent negatives include no blurred vision, chest pain, headaches, palpitations or shortness of breath. There are no associated agents to hypertension. Risk factors for coronary artery disease include dyslipidemia, diabetes mellitus, obesity, sedentary lifestyle and post-menopausal state. Past treatments include beta blockers and diuretics. The current treatment provides moderate improvement. Compliance problems include exercise and diet.  Hypertensive end-organ damage includes kidney disease. Identifiable causes of hypertension include chronic renal disease.   Review of systems  Constitutional: + fluctuating body weight,  current Body mass index is 51.63 kg/m. , + intermittent fatigue, no subjective hyperthermia, no subjective hypothermia Eyes: no blurry vision, no xerophthalmia ENT: no sore throat, no nodules palpated in throat, no dysphagia/odynophagia, no hoarseness Cardiovascular: no chest pain, no shortness of breath, no palpitations, no leg swelling Respiratory: no cough, no shortness of breath Gastrointestinal: no nausea/vomiting/diarrhea Musculoskeletal: no muscle/joint aches Skin: no rashes, no hyperemia Neurological: no tremors, no numbness, no tingling, no dizziness Psychiatric: no depression, no anxiety    Objective:    BP 118/83 (BP Location: Left Arm, Patient Position: Sitting, Cuff Size: Large)   Pulse (!) 109   Ht 5\' 10"  (1.778 m)   Wt (!)  359 lb 12.8 oz (163.2 kg)   BMI 51.63 kg/m   Wt Readings from Last 3 Encounters:  03/17/23 (!) 359 lb 12.8 oz (163.2 kg)  03/04/23 (!) 350 lb 1.3 oz (158.8 kg)  02/01/23 (!) 356 lb (161.5 kg)     BP Readings from Last 3 Encounters:  03/17/23 118/83  03/04/23 118/70  11/11/22 116/79     Physical Exam- Limited  Constitutional:  Body mass index is 51.63 kg/m. , not in acute distress, normal state of mind Eyes:  EOMI, no exophthalmos Musculoskeletal: no gross deformities, strength intact  in all four extremities, no gross restriction of joint movements Skin:  no rashes, no hyperemia Neurological: no tremor with outstretched hands   Diabetic Foot Exam - Simple   No data filed     CMP     Component Value Date/Time   NA 138 03/04/2023 1359   K 4.5 03/04/2023 1359   CL 97 03/04/2023 1359   CO2 24 03/04/2023 1359   GLUCOSE 187 (H) 03/04/2023 1359   GLUCOSE 148 (H) 12/21/2019 1252   BUN 10 03/04/2023 1359   CREATININE 0.87 03/04/2023 1359   CREATININE 0.71 12/21/2019 1252   CALCIUM 9.8 03/04/2023 1359   PROT 8.1 03/04/2023 1359   ALBUMIN 4.6 03/04/2023 1359   AST 32 03/04/2023 1359   ALT 33 (H) 03/04/2023 1359   ALKPHOS 74 03/04/2023 1359   BILITOT 0.7 03/04/2023 1359   GFRNONAA 77 02/13/2019 1207   GFRAA 90 02/13/2019 1207    Diabetic Labs (most recent): Lab Results  Component Value Date   HGBA1C 7.7 (A) 03/17/2023   HGBA1C 7.1 (A) 11/11/2022   HGBA1C 7.1 (H) 07/15/2022   MICROALBUR 30 05/22/2021   MICROALBUR 7.9 12/21/2019   MICROALBUR 63.8 (H) 04/12/2018    Lipid Panel     Component Value Date/Time   CHOL 165 03/04/2023 1359   TRIG 123 03/04/2023 1359   HDL 38 (L) 03/04/2023 1359   CHOLHDL 4.3 03/04/2023 1359   CHOLHDL 3.5 02/01/2020 1415   VLDL 23 07/13/2016 1430   LDLCALC 105 (H) 03/04/2023 1359   LDLCALC 75 02/01/2020 1415      Lab Results  Component Value Date   TSH 0.722 07/15/2022   TSH 0.955 06/24/2021   TSH 0.727 12/05/2020   TSH 0.69  02/13/2019   TSH 1.23 05/09/2018   TSH 0.95 07/13/2016   TSH 1.26 07/10/2015   TSH 1.382 11/17/2013   TSH 0.519 04/14/2012   TSH 1.525 11/07/2009      Lipid Panel     Component Value Date/Time   CHOL 165 03/04/2023 1359   TRIG 123 03/04/2023 1359   HDL 38 (L) 03/04/2023 1359   CHOLHDL 4.3 03/04/2023 1359   CHOLHDL 3.5 02/01/2020 1415   VLDL 23 07/13/2016 1430   LDLCALC 105 (H) 03/04/2023 1359   LDLCALC 75 02/01/2020 1415     Assessment & Plan:   1) Type 2 Diabetes without complication, with long-term current use of insulin  - Demaya D Hanselman has currently uncontrolled symptomatic type 2 DM since 67 years of age.  She presents today with her CGM and logs showing at target glycemic profile overall.  Her POCT A1c today is 7.7%, increasing slightly from last visit of 7.1%.  Analysis of her CGM shows TIR 89%, TAR 11%, TBR 0%.  She denies any hypoglycemia.  She has been injecting 35 units of U500 TID instead of lowering to 25 as recommended last visit.    -her diabetes is complicated by obesity/sedentary life and DEAUNNA RAGLE remains at a high risk for more acute and chronic complications which include CAD, CVA, CKD, retinopathy, and neuropathy. These are all discussed in detail with the patient.  - Nutritional counseling repeated at each appointment due to patients tendency to fall back in to old habits.  - The patient admits there is a room for improvement in their diet and drink choices. -  Suggestion is made for the patient to avoid simple carbohydrates from their diet including Cakes, Sweet Desserts / Pastries, Ice Cream, Soda (diet and regular),  Sweet Tea, Candies, Chips, Cookies, Sweet Pastries, Store Bought Juices, Alcohol in Excess of 1-2 drinks a day, Artificial Sweeteners, Coffee Creamer, and "Sugar-free" Products. This will help patient to have stable blood glucose profile and potentially avoid unintended weight gain.   - I encouraged the patient to switch to  unprocessed or minimally processed complex starch and increased protein intake (animal or plant source), fruits, and vegetables.   - Patient is advised to stick to a routine mealtimes to eat 3 meals a day and avoid unnecessary snacks (to snack only to correct hypoglycemia).  - I have approached her with the following individualized plan to manage diabetes and patient agrees:   -She is advised to continue U500 35 units TID with meals if glucose is above 90 and she is eating, continue Ozempic to 2 mg SQ weekly, and continue her Metformin 1000 mg po twice daily with meals for now.   -Encouraged patient to monitor blood glucose 4 times per day (using her CGM), before meals and at bedtime and notify the clinic if blood glucose levels are less than 70 or greater than 300 for 3 tests in a row.  She has benefited greatly from CGM device- is advised to continue using it but will retry upgrading her device to the [place] 3 so she can get alarms given MDI treatment.  -She is also advised not to inject insulin without proper glucose monitoring.  2) BP/HTN:  Her blood pressure is controlled to target.  She typically waits to take her Spironolactone 25 mg until she gets home so she won't have to urinate as often.  She is advised to continue Metoprolol 50 mg po twice daily, Olmesartan 20 mg po daily, and continue Spironolactone 25 mg po daily.    3) Lipids/HPL:  Her recent lipid panel from 07/15/22 shows controlled LDL of 90.  She is advised to continue Crestor 20 mg po daily at bedtime.  Side effects and precautions discussed with her.  She has annual exam with her PCP coming up with labs.  4) Vitamin D Deficiency: Her recent vitamin D level was 24.1 on 07/15/22.  She has completed replenishment with ergocalciferol and is now taking OTC Vitamin D3 5000 units daily as maintenance dose.  She is advised to continue for now.    5) Chronic Care/Health Maintenance: -she is on Statin medications and is encouraged to  continue to follow up with Ophthalmology, Dentist, Podiatrist at least yearly or according to recommendations, and advised to stay away from smoking. I have recommended yearly flu vaccine and pneumonia vaccination at least every 5 years; moderate intensity exercise for up to 150 minutes weekly; and  sleep for at least 7 hours a day.  - I advised patient to maintain close follow up with Kerri Perches, MD for primary care needs.      I spent  47  minutes in the care of the patient today including review of labs from CMP, Lipids, Thyroid Function, Hematology (current and previous including abstractions from other facilities); face-to-face time discussing  her blood glucose readings/logs, discussing hypoglycemia and hyperglycemia episodes and symptoms, medications doses, her options of short and long term treatment based on the latest standards of care / guidelines;  discussion about incorporating lifestyle medicine;  and documenting the encounter. Risk reduction counseling performed per USPSTF guidelines to reduce obesity and cardiovascular risk factors.     Please refer to Patient Instructions for Blood Glucose Monitoring and Insulin/Medications Dosing Guide"  in media tab for  additional information. Please  also refer to " Patient Self Inventory" in the Media  tab for reviewed elements of pertinent patient history.  Edward Jolly participated in the discussions, expressed understanding, and voiced agreement with the above plans.  All questions were answered to her satisfaction. she is encouraged to contact clinic should she have any questions or concerns prior to her return visit.   Follow up plan: - Return in about 4 months (around 07/16/2023) for Diabetes F/U with A1c in office, No previsit labs, Bring meter and logs.   [name], [facility] [address] [place], [place] [address] Phone: [phone] Fax: [phone]  03/17/2023, 11:23  AM

## 2023-03-18 DIAGNOSIS — Z23 Encounter for immunization: Secondary | ICD-10-CM | POA: Insufficient documentation

## 2023-03-18 NOTE — Assessment & Plan Note (Signed)
Diabetes linked with hypertension, neurologic disease ,  morbid obesity , arthritits Tracey [name] is reminded of the importance of commitment to daily physical activity for 30 minutes or more, as able and the need to limit carbohydrate intake to 30 to 60 grams per meal to help with blood sugar control.   The need to take medication as prescribed, test blood sugar as directed, and to call between visits if there is a concern that blood sugar is uncontrolled is also discussed.   Tracey [name] is reminded of the importance of daily foot exam, annual eye examination, and good blood sugar, blood pressure and cholesterol control.     Latest Ref Rng & Units 03/17/2023   11:11 AM 03/04/2023    1:59 PM 11/11/2022   10:41 AM 07/15/2022    2:21 PM 07/06/2022   10:42 AM  Diabetic Labs  HbA1c 4.0 - 5.6 % 7.7   7.1  7.1  6.9   Micro/Creat Ratio 0 - 29 mg/g creat    27    Chol 100 - 199 mg/dL  621   308    HDL >65 mg/dL  38   42    Calc LDL 0 - 99 mg/dL  784   90    Triglycerides 0 - 149 mg/dL  696   295    Creatinine 0.57 - 1.00 mg/dL  2.84   1.32        44/0/1027   10:52 AM 03/04/2023    1:10 PM 02/01/2023   10:15 AM 11/11/2022   10:24 AM 09/16/2022   11:02 AM 08/04/2022    3:27 PM 07/15/2022    2:07 PM  BP/Weight  Systolic BP 118 118 -- 116 142 128 110  Diastolic BP 83 70 -- 79 84 76 80  Wt. (Lbs) 359.8 350.08 356 352.4  353   BMI 51.63 kg/m2 50.23 kg/m2 51.08 kg/m2 50.56 kg/m2  50.65 kg/m2       12/31/2021    9:00 AM 02/13/2019   11:00 AM  Foot/eye exam completion dates  Foot Form Completion Done Done      Managed by Endo controlled

## 2023-03-18 NOTE — Assessment & Plan Note (Signed)
Controlled, no change in medication  

## 2023-03-18 NOTE — Assessment & Plan Note (Signed)
After obtaining informed consent, the influenza vaccine is  administered , with no adverse effect noted at the time of administration.

## 2023-03-18 NOTE — Assessment & Plan Note (Signed)
No s/s of decompensation currently clinically stable

## 2023-03-18 NOTE — Progress Notes (Signed)
Tracey [name]     MRN: [id number]      DOB: 1956/01/26  Chief Complaint  Patient presents with   Follow-up    Follow up headaches x 1 month/ hair loss    HPI Tracey [name] is here for follow up and re-evaluation of chronic medical conditions, medication management and review of any available recent lab and radiology data.  Preventive health is updated, specifically  Cancer screening and Immunization.   Questions or concerns regarding consultations or procedures which the PT has had in the interim are  addressed. The PT denies any adverse reactions to current medications since the last visit.  C/o headache relieved with tylenol, on avg twice per week, no other new neurologic symptoms Denies polyuria, polydipsia, blurred vision , or hypoglycemic episodes.  ROS Denies recent fever or chills. Denies sinus pressure, nasal congestion, ear pain or sore throat. Denies chest congestion, productive cough or wheezing. Denies chest pains, palpitations and leg swelling Denies abdominal pain, nausea, vomiting,diarrhea or constipation.   Denies dysuria, frequency, hesitancy or incontinence. Chronic  joint pain,  and limitation in mobility. Denies  seizures, numbness, or tingling. Denies depression, anxiety or insomnia. Denies skin break down or rash.   PE  BP 118/70 (BP Location: Right Arm, Patient Position: Sitting, Cuff Size: Large)   Pulse (!) 133   Ht 5\' 10"  (1.778 m)   Wt (!) 350 lb 1.3 oz (158.8 kg)   SpO2 95%   BMI 50.23 kg/m   Patient alert and oriented and in no cardiopulmonary distress.  HEENT: No facial asymmetry, EOMI,     Neck decreased ROM .  Chest: Clear to auscultation bilaterally.  CVS: S1, S2 no murmurs, no S3.Regular rate.  ABD: Soft non tender.   Ext: No edema  MS: decreased  ROM spine, shoulders, hips and knees.  Skin: Intact, no ulcerations or rash noted.  Psych: Good eye contact, normal affect. Memory intact not anxious or depressed  appearing.  CNS: CN 2-12 intact, power,  normal throughout.no focal deficits noted.   Assessment & Plan  Essential hypertension, benign Controlled, no change in medication   Diastolic dysfunction No s/s of decompensation currently clinically stable  Mixed hyperlipidemia Hyperlipidemia:Low fat diet discussed and encouraged.   Lipid Panel  Lab Results  Component Value Date   CHOL 165 03/04/2023   HDL 38 (L) 03/04/2023   LDLCALC 105 (H) 03/04/2023   TRIG 123 03/04/2023   CHOLHDL 4.3 03/04/2023     Not at goal, needs to reduce fat intake  Type 2 diabetes mellitus with obesity (HCC) Diabetes linked with hypertension, neurologic disease ,  morbid obesity , arthritits Tracey [name] is reminded of the importance of commitment to daily physical activity for 30 minutes or more, as able and the need to limit carbohydrate intake to 30 to 60 grams per meal to help with blood sugar control.   The need to take medication as prescribed, test blood sugar as directed, and to call between visits if there is a concern that blood sugar is uncontrolled is also discussed.   Tracey [name] is reminded of the importance of daily foot exam, annual eye examination, and good blood sugar, blood pressure and cholesterol control.     Latest Ref Rng & Units 03/17/2023   11:11 AM 03/04/2023    1:59 PM 11/11/2022   10:41 AM 07/15/2022    2:21 PM 07/06/2022   10:42 AM  Diabetic Labs  HbA1c 4.0 - 5.6 % 7.7   7.1  7.1  6.9   Micro/Creat Ratio 0 - 29 mg/g creat    27    Chol 100 - 199 mg/dL  161   096    HDL >04 mg/dL  38   42    Calc LDL 0 - 99 mg/dL  540   90    Triglycerides 0 - 149 mg/dL  981   191    Creatinine 0.57 - 1.00 mg/dL  4.78   2.95        62/04/3084   10:52 AM 03/04/2023    1:10 PM 02/01/2023   10:15 AM 11/11/2022   10:24 AM 09/16/2022   11:02 AM 08/04/2022    3:27 PM 07/15/2022    2:07 PM  BP/Weight  Systolic BP 118 118 -- 116 142 128 110  Diastolic BP 83 70 -- 79 84 76 80  Wt. (Lbs)  359.8 350.08 356 352.4  353   BMI 51.63 kg/m2 50.23 kg/m2 51.08 kg/m2 50.56 kg/m2  50.65 kg/m2       12/31/2021    9:00 AM 02/13/2019   11:00 AM  Foot/eye exam completion dates  Foot Form Completion Done Done      Managed by Endo controlled   Encounter for immunization After obtaining informed consent, the influenza  vaccine is  administered , with no adverse effect noted at the time of administration.

## 2023-03-18 NOTE — Assessment & Plan Note (Signed)
Hyperlipidemia:Low fat diet discussed and encouraged.   Lipid Panel  Lab Results  Component Value Date   CHOL 165 03/04/2023   HDL 38 (L) 03/04/2023   LDLCALC 105 (H) 03/04/2023   TRIG 123 03/04/2023   CHOLHDL 4.3 03/04/2023     Not at goal, needs to reduce fat intake

## 2023-03-19 ENCOUNTER — Encounter: Payer: Self-pay | Admitting: Family Medicine

## 2023-03-23 ENCOUNTER — Telehealth: Payer: Self-pay | Admitting: Family Medicine

## 2023-03-23 NOTE — Telephone Encounter (Signed)
FYI, Patient will bring form to our office tomorrow. Caps form.   Copied from CRM [id number]. Topic: Referral - Status >> Mar 23, 2023 11:51 AM Fuller Mandril wrote: Reason for CRM: Call from [facility] with [employer] consulting regarding pt form. Per caller faxed lab request for with [facility] at the top on 11/7,11/18, and 12/2. Need provider signature on pages 3, 5, 7. Callback: [phone] Fax: [phone]

## 2023-03-24 ENCOUNTER — Telehealth: Payer: Self-pay | Admitting: Family Medicine

## 2023-03-24 NOTE — Telephone Encounter (Signed)
CAP/DA Forms Noted Copied Scanned Original in provider box Copy at front desk

## 2023-03-25 ENCOUNTER — Ambulatory Visit: Payer: Self-pay | Admitting: *Deleted

## 2023-03-25 ENCOUNTER — Telehealth: Payer: Self-pay | Admitting: Family Medicine

## 2023-03-25 ENCOUNTER — Encounter: Payer: Self-pay | Admitting: *Deleted

## 2023-03-25 NOTE — Telephone Encounter (Signed)
Copied from CRM [id number]. Topic: General - Phone/Fax/Address >> Mar 25, 2023  2:42 PM Mosetta Putt H wrote:  Reason for CRM: Pt is reqeusting paperwork be faxed over I advised pt it can take up to business days she did ask can it be done before 12/15 and give her a call when its done,

## 2023-03-26 NOTE — Patient Outreach (Signed)
Care Coordination   Follow Up Visit Note   03/25/2023 Name: Tracey [name] MRN: [id number] DOB: 12-07-1955  Tracey [name] is a 67 y.o. year old female who sees Lodema Hong, Milus Mallick, MD for primary care. I spoke with  Edward Jolly by phone today.  What matters to the patients health and wellness today?  Managing blood sugar    Goals Addressed             This Visit's Progress    Decrease A1C to <7% over the Next 6 Months   Not on track    Care Coordination Goals: Patient will follow-up with endocrinologist every 4 months or as recommended [name], NP (endocrinologist) 07/16/23 Patient will monitor blood sugar with Freestyle Libre Continuous Glucose Monitor 4 times per day and as needed  Patient will follow a modified carbohydrate diet.  Eat 3 meals a day with 30GM of carbohydrates and up to 2 snacks a day with less than 15GM of carbohydrates.  Avoid snacking, sugary drinks, and simple carbohydrates Patient will reference handout on Diabetes & Nutrition Patient will upgrade to a continuous glucose monitor with alarms when Medicare will cover the cost of the monitor (covered Libre system in 2020) Patient will increase physical activity level as tolerated with an ultimate goal of at least 150 minutes of moderate activity per week Patient will reference handouts on seated exercises and Diabetes & Exercise Patient will reach out to RN Care Coordinator [phone] with any care coordination or resource needs      Over the next 3 Months Patient will Monitor Blood Pressure Daily   On track    Care Coordination Goals: Patient will monitor and record blood pressure daily and as needed and will call PCP or specialist with any readings outside of recommended range Patient will take blood pressure log to PCP and specialty appointments for review Patient will state understanding of need to monitor blood pressure regularly Patient will reach out to RN Care Coordinator  [phone] with any care coordination or resource needs           SDOH assessments and interventions completed:  Yes SDOH Interventions Today    Flowsheet Row Most Recent Value  SDOH Interventions   Food Insecurity Interventions Other (Comment)  [Patient has been provided with contact information for community care guides. States today that she has food and has not gone without.]  Transportation Interventions Patient Resources Dietitian)        Care Coordination Interventions:  Yes, provided  Interventions Today    Flowsheet Row Most Recent Value  Chronic Disease   Chronic disease during today's visit Diabetes, Hypertension (HTN)  General Interventions   General Interventions Discussed/Reviewed General Interventions Discussed, General Interventions Reviewed, Annual Eye Exam, Labs, Annual Foot Exam, Durable Medical Equipment (DME), Doctor Visits  Labs Hgb A1c every 3 months  Doctor Visits Discussed/Reviewed Doctor Visits Discussed, Doctor Visits Reviewed, Specialist, PCP, Annual Wellness Visits  Durable Medical Equipment (DME) BP Cuff, Glucomoter, Other  [Freestyle Libre Continuous Glucose Monitor. Patient will upgrade to new monitor with alarms once covered by insurance. Provided with Libre in 2020. Glucose was 176 this morning. No blood pressure readings.]  PCP/Specialist Visits Compliance with follow-up visit  [facility], Dr Ralene Cork, on 05/05/23. Endocrinologist, [name], NP, on 07/16/23. PCP, Dr Lodema Hong, on 09/07/23. Annual Wellness Visit on 02/02/24.]  Exercise Interventions   Exercise Discussed/Reviewed Exercise Discussed, Exercise Reviewed, Physical Activity, Weight Managment  Physical Activity Discussed/Reviewed Physical Activity Discussed, Physical Activity  Reviewed, Types of exercise  [previously provided with handout on seated exercises. Encouraged to increase as tolerated with an ultimate goal of 150 minutes per week.]  Weight Management Weight loss   Education Interventions   Education Provided Provided Education  Provided Verbal Education On Nutrition, Foot Care, Eye Care, Labs, Blood Sugar Monitoring, Medication, When to see the doctor  Labs Reviewed Hgb A1c  [03/17/23 A1c 7.7%, increasing from 7.1% on 11/11/22]  Nutrition Interventions   Nutrition Discussed/Reviewed Nutrition Discussed, Nutrition Reviewed, Adding fruits and vegetables, Carbohydrate meal planning, Fluid intake, Portion sizes, Decreasing sugar intake, Decreasing salt, Increasing proteins  [3 meals per day with 30 GM of CHO and up to 2 snacks per day, if needed, with less than 15 GM of CHO. Avoid sugary drinks and simple carbohydrates.]  Pharmacy Interventions   Pharmacy Dicussed/Reviewed Pharmacy Topics Discussed, Pharmacy Topics Reviewed, Medications and their functions  [taking meds as directed. Per endocrinology note, She is advised to continue U500 35 units TID with meals if glucose is above 90 and she is eating, continue Ozempic to 2 mg SQ weekly, and continue her Metformin 1000 mg po twice daily with meals for now.]  Safety Interventions   Safety Discussed/Reviewed Safety Discussed, Safety Reviewed, Fall Risk, Home Safety  Home Safety Assistive Devices       Follow up plan: Follow up call scheduled for 04/27/2023    Encounter Outcome:  Patient Visit Completed   Demetrios Loll, RN, BSN Care Manager [place]  [facility]  Population Health  Direct Dial: [phone] Main #: [phone]

## 2023-03-30 NOTE — Telephone Encounter (Signed)
Forms completed awaiting provider signature

## 2023-04-15 NOTE — Telephone Encounter (Signed)
 Has these forms been signed?  Patient calling back.

## 2023-04-22 ENCOUNTER — Telehealth: Payer: Self-pay | Admitting: Family Medicine

## 2023-04-22 NOTE — Telephone Encounter (Signed)
 Copied from CRM [id number]. Topic: General - Other >> Apr 22, 2023 10:48 AM Eleanore Grey wrote: Reason for CRM: Ardis Becton from [facility] consulting regarding [facility] form, calling to confirm if form was received because they have not received it. Fax number is [phone]. Phone number is [phone], can ask for [place].

## 2023-04-27 ENCOUNTER — Ambulatory Visit: Payer: Self-pay | Admitting: *Deleted

## 2023-04-27 ENCOUNTER — Encounter: Payer: Self-pay | Admitting: *Deleted

## 2023-04-27 NOTE — Patient Outreach (Signed)
 Health Equity Plan Care Coordination   Follow Up Visit Note   04/27/2023 Name: DILLAN LUNDEN MRN: [id number] DOB: 13-May-1955  ROBYNNE ROAT is a 68 y.o. year old female who sees [name], MD for primary care. I spoke with  Harvest JONETTA Mango by phone today.  What matters to the patients health and wellness today?  Qualifying for CAP personal care services.     Goals Addressed             This Visit's Progress    Decrease A1C to <7% over the Next 6 Months   On track    Care Coordination Goals: Patient will follow-up with endocrinologist every 4 months or as recommended Benton Rio, NP (endocrinologist) 07/16/23 Patient will monitor blood sugar with Freestyle Libre Continuous Glucose Monitor 4 times per day and as needed  Patient will follow a modified carbohydrate diet.  Eat 3 meals a day with 30GM of carbohydrates and up to 2 snacks a day with less than 15GM of carbohydrates.  Avoid snacking, sugary drinks, and simple carbohydrates Patient will upgrade to a continuous glucose monitor with alarms when Medicare will cover the cost of the monitor (covered Libre system in 2020) Patient will increase physical activity level as tolerated with an ultimate goal of at least 150 minutes of moderate activity per week Patient will reach out to [facility] Coordinator [phone] with any care coordination or resource needs         SDOH assessments and interventions completed:  No     Care Coordination Interventions:  Yes, provided  Interventions Today    Flowsheet Row Most Recent Value  Chronic Disease   Chronic disease during today's visit Diabetes  General Interventions   General Interventions Discussed/Reviewed General Interventions Discussed, General Interventions Reviewed, Labs, Communication with, Durable Medical Equipment (DME), Doctor Visits, Level of Care  [phone] this morning.]  Labs Hgb A1c every 3 months  Doctor Visits Discussed/Reviewed Doctor  Visits Discussed, Doctor Visits Reviewed, PCP, Specialist  Durable Medical Equipment (DME) Glucomoter  [continuous glucose meter. Glucose 142 this morning.]  PCP/Specialist Visits Compliance with follow-up visit  [Follow-up with endocrinologist on 4/4]  Communication with Social Work  Level of Care Personal Care Services  Applications Personal Care Services  [completed a CAP application in December and hasn't heard back]  Exercise Interventions   Exercise Discussed/Reviewed Physical Activity  Physical Activity Discussed/Reviewed Physical Activity Discussed, Physical Activity Reviewed  Education Interventions   Education Provided Provided Education  Provided Verbal Education On Nutrition, Blood Sugar Monitoring, Medication, When to see the doctor, Applications, Labs  Labs Reviewed Hgb A1c  Applications Personal Care Services  [completed a CAP application in December and hasn't heard back]  Mental Health Interventions   Mental Health Discussed/Reviewed Refer to Social Work for resources  Refer to Social Work for resources regarding Other  [CAP application]  Nutrition Interventions   Nutrition Discussed/Reviewed Carbohydrate meal planning, Nutrition Reviewed, Nutrition Discussed, Fluid intake, Portion sizes, Decreasing sugar intake, Adding fruits and vegetables  Pharmacy Interventions   Pharmacy Dicussed/Reviewed Pharmacy Topics Discussed, Pharmacy Topics Reviewed, Medications and their functions  Safety Interventions   Safety Discussed/Reviewed Safety Discussed, Safety Reviewed, Fall Risk, Home Safety  Home Safety Assistive Devices       Follow up plan: Follow up call scheduled for 05/28/23    Encounter Outcome:  Patient Visit Completed   Josette Pellet, RN, BSN [place]  [facility], [facility] Health RN Care Manager Direct Dial: [phone]

## 2023-04-28 DIAGNOSIS — E119 Type 2 diabetes mellitus without complications: Secondary | ICD-10-CM | POA: Diagnosis not present

## 2023-04-28 LAB — HM DIABETES EYE EXAM

## 2023-05-03 ENCOUNTER — Telehealth: Payer: Self-pay | Admitting: Family Medicine

## 2023-05-03 NOTE — Telephone Encounter (Signed)
  Copied from CRM [id number]. Topic: General - Other >> Apr 30, 2023  5:40 PM Antony Haste wrote: Reason for CRM: Kathlene November from [facility] consulting wanted to make sure faxed documentation has been received for this patient. He states the office was informed it has been received, but he's awaiting the paperwork to be signed by Dr. Lodema Hong and faxed back over to them. He would like to make sure this can be completed as soon as possible.  Callback: [phone] Fax #:  [phone]

## 2023-05-05 ENCOUNTER — Ambulatory Visit: Payer: Medicare Other | Admitting: Podiatry

## 2023-05-17 ENCOUNTER — Telehealth: Payer: Self-pay | Admitting: Family Medicine

## 2023-05-17 NOTE — Telephone Encounter (Signed)
Copied from CRM [id number]. Topic: General - Other >> May 17, 2023  2:23 PM Fredrica W wrote: Reason for CRM: Rosalita Levan from [facility] consulting calling about lab request form for DNA testing coming from lab that requires provider signature. Follow up request. Thank You

## 2023-05-18 NOTE — Telephone Encounter (Signed)
Per Dr Lodema Hong she will not be completing these forms

## 2023-05-18 NOTE — Telephone Encounter (Signed)
First attempt to contact. No answer. Lvm for call back to gain more information

## 2023-05-21 ENCOUNTER — Telehealth: Payer: Self-pay | Admitting: Family Medicine

## 2023-05-21 NOTE — Telephone Encounter (Signed)
 Patient calling back why not sign her papers.  Copied from CRM [id number]. Topic: Clinical - Request for Lab/Test Order >> May 21, 2023  2:55 PM Antwanette L wrote: Reason for CRM: Leita from [facility] [employer] is calling about lab order for genetic testing . Informed the rep that Dr. Antonetta is not going to sign the forms. Please fax forms back to [phone]

## 2023-05-21 NOTE — Telephone Encounter (Signed)
 Correct. The patient must consent to this before it will be signed

## 2023-05-24 ENCOUNTER — Telehealth: Payer: Self-pay | Admitting: Family Medicine

## 2023-05-24 ENCOUNTER — Other Ambulatory Visit: Payer: Self-pay | Admitting: Nurse Practitioner

## 2023-05-24 DIAGNOSIS — E1165 Type 2 diabetes mellitus with hyperglycemia: Secondary | ICD-10-CM

## 2023-05-24 NOTE — Telephone Encounter (Signed)
 Copied from CRM [id number]. Topic: Clinical - Request for Lab/Test Order >> May 21, 2023  2:59 PM Antwanette L wrote: Reason for CRM: Rice Chamorro from [facility] [employer] is calling to get an update on a order that was faxed yesterday. The order is for genetic testing. I told the representative that Dr. Rodolph Clap is not going to fill out the forms.

## 2023-05-24 NOTE — Telephone Encounter (Signed)
 Copied from CRM [id number]. Topic: General - Other >> May 24, 2023 12:40 PM Lizabeth Riggs wrote: Reason for CRM: [employer] [employer] is calling to get an update on a order that was faxed yesterday. The order is for genetic testing. I told the representative that Dr. Rodolph Clap is not going to fill out the forms. [facility] is requesting form to be faxed back with declined to complete on it. Fax # is [phone]

## 2023-05-28 ENCOUNTER — Ambulatory Visit: Payer: Self-pay | Admitting: *Deleted

## 2023-05-28 NOTE — Patient Outreach (Signed)
  Health Equity Plan Care Coordination   05/28/2023 Name: VIDALIA SERPAS MRN: [id number] DOB: 11/17/1955   Care Coordination Outreach Attempts:  An unsuccessful outreach was attempted for an appointment today.  Follow Up Plan:  Additional outreach attempts will be made to offer the patient complex care management information and services.   Encounter Outcome:  No Answer. Left HIPAA compliant VM.   Care Coordination Interventions:  No, not indicated. Staff message sent to care guide requesting outreach and rescheduling.    Demetrios Loll, RN, BSN [place]  [facility], [facility] Health RN Care Manager Direct Dial: [phone]

## 2023-06-03 ENCOUNTER — Other Ambulatory Visit: Payer: Self-pay | Admitting: Family Medicine

## 2023-06-09 ENCOUNTER — Ambulatory Visit: Payer: Medicare Other | Admitting: Podiatry

## 2023-06-23 ENCOUNTER — Telehealth: Payer: Self-pay | Admitting: *Deleted

## 2023-06-23 NOTE — Progress Notes (Signed)
 Complex Care Management Care Guide Note  06/23/2023 Name: Tracey [name] MRN: [id number] DOB: 1955/11/13  Tracey [name] is a 68 y.o. year old female who is a primary care patient of Kerri Perches, MD and is actively engaged with the care management team. I reached out to Edward Jolly by phone today to assist with re-scheduling  with the RN Case Manager.  Follow up plan: Unsuccessful telephone outreach attempt made. A HIPAA compliant phone message was left for the patient providing contact information and requesting a return call.  Gwenevere Ghazi  [facility] Health  [facility], [facility] Guide  Direct Dial: [phone]  Fax [phone]

## 2023-06-30 NOTE — Progress Notes (Signed)
 Complex Care Management Care Guide Note  06/30/2023 Name: Tracey [name] MRN: [id number] DOB: [birth year]  Tracey [name] is a 68 y.o. year old female who is a primary care patient of Kerri Perches, MD and is actively engaged with the care management team. I reached out to Edward Jolly by phone today to assist with re-scheduling  with the RN Case Manager.  Follow up plan: Telephone appointment with complex care management team member scheduled for:  3/25  Gwenevere Ghazi  [facility] Health  [facility], [facility] Guide  Direct Dial: [phone]  Fax [phone]

## 2023-07-06 ENCOUNTER — Encounter: Payer: Self-pay | Admitting: *Deleted

## 2023-07-06 ENCOUNTER — Ambulatory Visit: Payer: Self-pay | Admitting: *Deleted

## 2023-07-06 NOTE — Patient Outreach (Signed)
 Care Coordination   Health Equity Plan Follow Up Visit Note   07/06/2023 Name: Tracey [name] MRN: [id number] DOB: 1956/03/07  Tracey [name] is a 68 y.o. year old female who sees Kerri Perches, MD for primary care. I spoke with  Edward Jolly by phone today.  What matters to the patients health and wellness today?  Managing blood sugar and increasing stamina    Goals Addressed             This Visit's Progress    Decrease A1C to <7% over the Next 6 Months   On track    Care Coordination Goals: Patient will follow-up with endocrinologist every 4 months or as recommended [name], NP (endocrinologist) 07/16/23 Patient will monitor blood sugar with Freestyle Libre Continuous Glucose Monitor 4 times per day and as needed  Patient will follow a modified carbohydrate diet.  Eat 3 meals a day with 30GM of carbohydrates and up to 2 snacks a day with less than 15GM of carbohydrates.  Avoid snacking, sugary drinks, and simple carbohydrates Patient will upgrade to a continuous glucose monitor with alarms when Medicare will cover the cost of the monitor (covered Libre system in 2020) Patient will increase physical activity level as tolerated with an ultimate goal of at least 150 minutes of moderate activity per week Patient will consider [employer] PREP program Patient will reach out to RN Care Coordinator [phone] with any care coordination or resource needs         SDOH assessments and interventions completed:  Yes SDOH Interventions Today    Flowsheet Row Most Recent Value  SDOH Interventions   Transportation Interventions Patient Resources (Friends/Family)  Physical Activity Interventions Other (Comments)  [provided with printed educational materials on seated exercises and diabetes & exercise]         Care Coordination Interventions:  Yes, provided  Interventions Today    Flowsheet Row Most Recent Value  Chronic Disease   Chronic disease during  today's visit Diabetes  General Interventions   General Interventions Discussed/Reviewed General Interventions Discussed, General Interventions Reviewed, Durable Medical Equipment (DME), Doctor Visits, Labs, Annual Foot Exam, Annual Eye Exam  Labs Hgb A1c every 3 months, Kidney Function  Doctor Visits Discussed/Reviewed Doctor Visits Discussed, Doctor Visits Reviewed, PCP, Annual Wellness Visits, Specialist  [Eye exam completed on 04/28/23. Prescribed glasses. No diagnosis of retinopathy documented.]  Durable Medical Equipment (DME) Glucomoter  [phone] yesterday morning and 114 after medications]  PCP/Specialist Visits Compliance with follow-up visit  [Podiatry 07/07/23, Endocrinology 07/16/23, PCP 09/07/23]  Exercise Interventions   Exercise Discussed/Reviewed Exercise Discussed, Exercise Reviewed, Physical Activity, Weight Managment  [Patient has weakness in legs and shortness of breath with activity. She can't stand for too long. She sits to wash dishes and holds onto the rails with both hands to go up and down stairs to bedroom.]  Physical Activity Discussed/Reviewed Physical Activity Discussed, Physical Activity Reviewed, PREP, Types of exercise  [consider PREP program. Can discuss with PCP at next visit.]  Weight Management Weight loss  Education Interventions   Education Provided Provided Education  Provided Verbal Education On Nutrition, Foot Care, Eye Care, Labs, Blood Sugar Monitoring, Exercise, Medication, When to see the doctor  Labs Reviewed Hgb A1c, Kidney Function  Mental Health Interventions   Mental Health Discussed/Reviewed Mental Health Discussed, Mental Health Reviewed  Nutrition Interventions   Nutrition Discussed/Reviewed Nutrition Discussed, Nutrition Reviewed, Carbohydrate meal planning, Adding fruits and vegetables, Increasing proteins, Decreasing sugar intake, Portion sizes, Fluid intake  [  3 meals per day with 30 GM of CHO and up to 15 GM of CHO  twice a day as needed. Refer to  previously provided handout.]  Pharmacy Interventions   Pharmacy Dicussed/Reviewed Pharmacy Topics Discussed, Pharmacy Topics Reviewed, Medications and their functions  [taking medications as prescribed]  Safety Interventions   Safety Discussed/Reviewed Safety Discussed, Safety Reviewed, Home Safety, Fall Risk  Home Safety Assistive Devices       Follow up plan: Follow up call scheduled for 07/30/23    Encounter Outcome:  Patient Visit Completed   Demetrios Loll, RN, BSN [place]  [facility], [facility] Health RN Care Manager Direct Dial: [phone]

## 2023-07-07 ENCOUNTER — Encounter: Payer: Self-pay | Admitting: Podiatry

## 2023-07-07 ENCOUNTER — Ambulatory Visit (INDEPENDENT_AMBULATORY_CARE_PROVIDER_SITE_OTHER): Admitting: Podiatry

## 2023-07-07 DIAGNOSIS — E1142 Type 2 diabetes mellitus with diabetic polyneuropathy: Secondary | ICD-10-CM | POA: Diagnosis not present

## 2023-07-07 DIAGNOSIS — B351 Tinea unguium: Secondary | ICD-10-CM

## 2023-07-07 DIAGNOSIS — M79674 Pain in right toe(s): Secondary | ICD-10-CM

## 2023-07-07 DIAGNOSIS — M79675 Pain in left toe(s): Secondary | ICD-10-CM

## 2023-07-07 NOTE — Progress Notes (Signed)
  Subjective:  Patient ID: Tracey [name], female    DOB: 1955/06/24,   MRN: [id number]  Chief Complaint  Patient presents with   Nail Problem    dfc    68 y.o. female presents for concern of thickened elongated and painful nails that are difficult to trim. Requesting to have them trimmed today. Relates burning and tingling in their feet. Patient is diabetic and last A1c was  Lab Results  Component Value Date   HGBA1C 7.7 (A) 03/17/2023   .   PCP:  Kerri Perches, MD    . Denies any other pedal complaints. Denies n/v/f/c.   Past Medical History:  Diagnosis Date   Arthritis    Diabetes mellitus without complication (HCC)    Diastolic dysfunction 10/2009   Grade 1   GERD (gastroesophageal reflux disease)    Hyperlipidemia    Hyperlipidemia    Hypertension 04/15/2012   Morbid obesity (HCC)    PE (pulmonary embolism)    Pulmonary emboli (HCC) 1999   post TAH   Sleep apnea     Objective:  Physical Exam: Vascular: DP/PT pulses 2/4 bilateral. CFT <3 seconds. Absent hair growth on digits. Edema noted to bilateral lower extremities. Xerosis noted bilaterally.  Skin. No lacerations or abrasions bilateral feet. Nails 1-5 bilateral  are thickened discolored and elongated with subungual debris.  Musculoskeletal: MMT 5/5 bilateral lower extremities in DF, PF, Inversion and Eversion. Deceased ROM in DF of ankle joint.  Neurological: Sensation intact to light touch. Protective sensation diminished bilateral.     Assessment:   1. Pain due to onychomycosis of toenails of both feet   2. Diabetic peripheral neuropathy associated with type 2 diabetes mellitus (HCC)       Plan:  Patient was evaluated and treated and all questions answered. -Discussed and educated patient on diabetic foot care, especially with  regards to the vascular, neurological and musculoskeletal systems.  -Stressed the importance of good glycemic control and the detriment of not  controlling glucose  levels in relation to the foot. -Discussed supportive shoes at all times and checking feet regularly.  -Mechanically debrided all nails 1-5 bilateral using sterile nail nipper and filed with dremel without incident  -Answered all patient questions -Patient to return  in 3 months for at risk foot care -Patient advised to call the office if any problems or questions arise in the meantime.   Louann Sjogren, DPM

## 2023-07-16 ENCOUNTER — Ambulatory Visit: Payer: Medicare Other | Admitting: Nurse Practitioner

## 2023-07-16 DIAGNOSIS — I10 Essential (primary) hypertension: Secondary | ICD-10-CM

## 2023-07-16 DIAGNOSIS — Z794 Long term (current) use of insulin: Secondary | ICD-10-CM

## 2023-07-16 DIAGNOSIS — Z7985 Long-term (current) use of injectable non-insulin antidiabetic drugs: Secondary | ICD-10-CM

## 2023-07-16 DIAGNOSIS — E559 Vitamin D deficiency, unspecified: Secondary | ICD-10-CM

## 2023-07-16 DIAGNOSIS — E782 Mixed hyperlipidemia: Secondary | ICD-10-CM

## 2023-07-16 DIAGNOSIS — E119 Type 2 diabetes mellitus without complications: Secondary | ICD-10-CM

## 2023-07-16 DIAGNOSIS — Z7984 Long term (current) use of oral hypoglycemic drugs: Secondary | ICD-10-CM

## 2023-07-19 ENCOUNTER — Other Ambulatory Visit: Payer: Self-pay | Admitting: Family Medicine

## 2023-07-19 ENCOUNTER — Encounter: Payer: Self-pay | Admitting: Nurse Practitioner

## 2023-07-19 ENCOUNTER — Ambulatory Visit (INDEPENDENT_AMBULATORY_CARE_PROVIDER_SITE_OTHER): Admitting: Nurse Practitioner

## 2023-07-19 VITALS — BP 118/68 | HR 70 | Ht 70.0 in | Wt 372.2 lb

## 2023-07-19 DIAGNOSIS — E1165 Type 2 diabetes mellitus with hyperglycemia: Secondary | ICD-10-CM | POA: Diagnosis not present

## 2023-07-19 DIAGNOSIS — Z7985 Long-term (current) use of injectable non-insulin antidiabetic drugs: Secondary | ICD-10-CM

## 2023-07-19 DIAGNOSIS — E119 Type 2 diabetes mellitus without complications: Secondary | ICD-10-CM | POA: Diagnosis not present

## 2023-07-19 DIAGNOSIS — Z794 Long term (current) use of insulin: Secondary | ICD-10-CM | POA: Diagnosis not present

## 2023-07-19 DIAGNOSIS — Z7984 Long term (current) use of oral hypoglycemic drugs: Secondary | ICD-10-CM

## 2023-07-19 LAB — POCT GLYCOSYLATED HEMOGLOBIN (HGB A1C): Hemoglobin A1C: 9.5 % — AB (ref 4.0–5.6)

## 2023-07-19 MED ORDER — METFORMIN HCL 1000 MG PO TABS: 1000.0000 mg | ORAL_TABLET | Freq: Two times a day (BID) | ORAL | 3 refills | Status: AC

## 2023-07-19 MED ORDER — HUMULIN R U-500 KWIKPEN 500 UNIT/ML ~~LOC~~ SOPN
35.0000 [IU] | PEN_INJECTOR | Freq: Three times a day (TID) | SUBCUTANEOUS | 3 refills | Status: AC
Start: 2023-07-19 — End: ?

## 2023-07-19 MED ORDER — SEMAGLUTIDE (2 MG/DOSE) 8 MG/3ML ~~LOC~~ SOPN: 2.0000 mg | PEN_INJECTOR | SUBCUTANEOUS | 1 refills | Status: DC

## 2023-07-19 NOTE — Progress Notes (Signed)
 07/19/2023, 2:04 PM   Endocrinology Follow Up Visit    Subjective:    Patient ID: Tracey [name], female    DOB: 1955-05-02.  Tracey [name] is being engaged in follow-up for management of currently uncontrolled symptomatic type 2 diabetes, hyperlipidemia, hypertension.    PMD:   Kerri Perches, MD.  Past Medical History:  Diagnosis Date   Arthritis    Diabetes mellitus without complication (HCC)    Diastolic dysfunction 10/2009   Grade 1   GERD (gastroesophageal reflux disease)    Hyperlipidemia    Hyperlipidemia    Hypertension 04/15/2012   Morbid obesity (HCC)    PE (pulmonary embolism)    Pulmonary emboli (HCC) 1999   post TAH   Sleep apnea    Past Surgical History:  Procedure Laterality Date   ABDOMINAL HYSTERECTOMY  1999   , totalfor fibroids   CESAREAN SECTION     CHOLECYSTECTOMY  1996   COLONOSCOPY N/A 10/31/2015   Procedure: COLONOSCOPY;  Surgeon: Malissa Hippo, MD;  Location: [facility];  Service: Endoscopy;  Laterality: N/A;  930   FOOT SURGERY Right    October 2022   Social History   Socioeconomic History   Marital status: Single    Spouse name: Not on file   Number of children: Not on file   Years of education: Not on file   Highest education level: Not on file  Occupational History   Not on file  Tobacco Use   Smoking status: Never   Smokeless tobacco: Never  Vaping Use   Vaping status: Never Used  Substance and Sexual Activity   Alcohol use: No   Drug use: No   Sexual activity: Not Currently  Other Topics Concern   Not on file  Social History Narrative   Not on file   Social Drivers of Health   Financial Resource Strain: Low Risk  (02/01/2023)   Overall Financial Resource Strain (CARDIA)    Difficulty of Paying Living Expenses: Not very hard  Food Insecurity: Food Insecurity Present (03/26/2023)   Hunger  Vital Sign    Worried About Running Out of Food in the Last Year: Sometimes true    Ran Out of Food in the Last Year: Sometimes true  Transportation Needs: No Transportation Needs (07/06/2023)   PRAPARE - Administrator, Civil Service (Medical): No    Lack of Transportation (Non-Medical): No  Physical Activity: Inactive (07/06/2023)   Exercise Vital Sign    Days of Exercise per Week: 0 days    Minutes of Exercise per Session: 0 min  Stress: No Stress Concern Present (02/01/2023)   [employer] of Occupational Health - Occupational Stress Questionnaire    Feeling of Stress : Only a little  Social Connections: Moderately Isolated (02/01/2023)   Social Connection and Isolation Panel [NHANES]    Frequency of Communication with Friends and Family: More than three times a week  Frequency of Social Gatherings with Friends and Family: More than three times a week    Attends Religious Services: More than 4 times per year    Active Member of Clubs or Organizations: No    Attends Banker Meetings: Never    Marital Status: Divorced   Outpatient Encounter Medications as of 07/19/2023  Medication Sig   acetaminophen (TYLENOL) 500 MG tablet Take 500 mg by mouth every 6 (six) hours as needed for moderate pain.   aspirin EC 81 MG tablet Take 1 tablet (81 mg total) by mouth daily.   blood glucose meter kit and supplies KIT Dispense based on patient and insurance preference. Use up to four times daily as directed.   glucose blood (ACCU-CHEK GUIDE TEST) test strip Use as instructed to monitor glucose 4 times daily- use as backup for CGM   Lancets MISC Use to test BG qid. Dx : E11.65. Softclix Lancets   metoprolol tartrate (LOPRESSOR) 50 MG tablet TAKE ONE TABLET (50MG  TOTAL) BY MOUTH TWO TIMES DAILY   mupirocin ointment (BACTROBAN) 2 % Apply to affected area once daily.   olmesartan (BENICAR) 20 MG tablet TAKE ONE TABLET (20MG  TOTAL) BY MOUTH DAILY   omeprazole (PRILOSEC) 20  MG capsule TAKE ONE CAPSULE BY MOUTH DAILY   rosuvastatin (CRESTOR) 20 MG tablet TAKE ONE TABLET (20MG  TOTAL) BY MOUTH DAILY   spironolactone (ALDACTONE) 25 MG tablet TAKE ONE (1) TABLET BY MOUTH EVERY DAY   UNIFINE PENTIPS 31G X 8 MM MISC USE FOR INSULIN INJECTIONS THREE TIMES DAILY.   [DISCONTINUED] HUMULIN R U-500 KWIKPEN 500 UNIT/ML KwikPen INJECT 34 UNITS SUBCUTANEOUSLY INTO THE SKIN 3 TIMES DAILY WITH MEALS.   [DISCONTINUED] metFORMIN (GLUCOPHAGE) 1000 MG tablet Take 1 tablet (1,000 mg total) by mouth 2 (two) times daily with a meal.   [DISCONTINUED] Semaglutide, 2 MG/DOSE, 8 MG/3ML SOPN Inject 2 mg as directed once a week.   insulin regular human CONCENTRATED (HUMULIN R U-500 KWIKPEN) 500 UNIT/ML KwikPen Inject 35 Units into the skin 3 (three) times daily with meals.   metFORMIN (GLUCOPHAGE) 1000 MG tablet Take 1 tablet (1,000 mg total) by mouth 2 (two) times daily with a meal.   Semaglutide, 2 MG/DOSE, 8 MG/3ML SOPN Inject 2 mg as directed once a week.   [DISCONTINUED] Continuous Glucose Sensor (FREESTYLE LIBRE 3 PLUS SENSOR) MISC Change sensor every 15 days. (Patient not taking: Reported on 07/19/2023)   No facility-administered encounter medications on file as of 07/19/2023.    ALLERGIES: Allergies  Allergen Reactions   Haloperidol Lactate Anaphylaxis   Lipitor [Atorvastatin Calcium] Other (See Comments)    Markedly elevated liver enzymes    VACCINATION STATUS: Immunization History  Administered Date(s) Administered   Fluad Trivalent(High Dose 65+) 03/04/2023   Influenza,inj,Quad PF,6+ Mos 04/01/2015, 03/16/2016, 01/20/2017, 04/11/2018, 02/13/2019, 01/31/2020, 12/05/2020   Moderna Sars-Covid-2 Vaccination 12/13/2019, 01/11/2020   PNEUMOCOCCAL CONJUGATE-20 12/05/2020   Pneumococcal Polysaccharide-23 05/13/2015    Diabetes She presents for her follow-up diabetic visit. She has type 2 diabetes mellitus. Onset time: He was diagnosed at approximate age of 22 years. Her disease  course has been fluctuating. There are no hypoglycemic associated symptoms. Pertinent negatives for hypoglycemia include no confusion, headaches, pallor or seizures. Associated symptoms include fatigue. Pertinent negatives for diabetes include no blurred vision, no chest pain, no polydipsia, no polyphagia and no polyuria. There are no hypoglycemic complications. Symptoms are stable. Diabetic complications include heart disease and nephropathy. Risk factors for coronary artery disease include diabetes mellitus, dyslipidemia, family  history, hypertension, obesity, post-menopausal and sedentary lifestyle. Current diabetic treatment includes intensive insulin program and oral agent (monotherapy) (and Ozempic). She is compliant with treatment most of the time. Her weight is fluctuating minimally. She is following a generally healthy diet. When asked about meal planning, she reported none. She has had a previous visit with a dietitian. She never participates in exercise. Her home blood glucose trend is decreasing steadily. Her overall blood glucose range is 140-180 mg/dl. (She presents today with her CGM and logs showing at target glycemic profile overall.  Her POCT A1c today is 9.5%, increasing slightly from last visit of 7.7%.  Analysis of her CGM shows TIR 81%, TAR 19%, TBR 0%.  She denies any hypoglycemia.  She has been out of her Ozempic for 2 weeks, will be picking up refill today.  She notes her CGM upgrade would cost her out of pocket, so she prefers to stick with the same regimen for now.  ) An ACE inhibitor/angiotensin II receptor blocker is not being taken. She does not see a podiatrist.Eye exam is not current.  Hyperlipidemia This is a chronic problem. The current episode started more than 1 year ago. The problem is uncontrolled. Recent lipid tests were reviewed and are high. Exacerbating diseases include chronic renal disease, diabetes and obesity. Factors aggravating her hyperlipidemia include fatty foods.  Pertinent negatives include no chest pain, myalgias or shortness of breath. Current antihyperlipidemic treatment includes statins. The current treatment provides moderate improvement of lipids. Compliance problems include adherence to diet and adherence to exercise.  Risk factors for coronary artery disease include diabetes mellitus, dyslipidemia, hypertension, obesity, a sedentary lifestyle and post-menopausal.  Hypertension This is a chronic problem. The current episode started more than 1 year ago. The problem is unchanged. The problem is uncontrolled. Pertinent negatives include no blurred vision, chest pain, headaches, palpitations or shortness of breath. There are no associated agents to hypertension. Risk factors for coronary artery disease include dyslipidemia, diabetes mellitus, obesity, sedentary lifestyle and post-menopausal state. Past treatments include beta blockers and diuretics. The current treatment provides moderate improvement. Compliance problems include exercise and diet.  Hypertensive end-organ damage includes kidney disease. Identifiable causes of hypertension include chronic renal disease.   Review of systems  Constitutional: +increasing body weight,  current Body mass index is 53.41 kg/m. , + intermittent fatigue, no subjective hyperthermia, no subjective hypothermia Eyes: no blurry vision, no xerophthalmia ENT: no sore throat, no nodules palpated in throat, no dysphagia/odynophagia, no hoarseness Cardiovascular: no chest pain, no shortness of breath, no palpitations, no leg swelling Respiratory: no cough, no shortness of breath Gastrointestinal: no nausea/vomiting/diarrhea Musculoskeletal: no muscle/joint aches Skin: no rashes, no hyperemia Neurological: no tremors, no numbness, no tingling, no dizziness Psychiatric: no depression, no anxiety    Objective:    BP 118/68 (BP Location: Left Arm, Patient Position: Sitting, Cuff Size: Large)   Pulse 70   Ht 5\' 10"  (1.778  m)   Wt (!) 372 lb 3.2 oz (168.8 kg)   BMI 53.41 kg/m   Wt Readings from Last 3 Encounters:  07/19/23 (!) 372 lb 3.2 oz (168.8 kg)  03/17/23 (!) 359 lb 12.8 oz (163.2 kg)  03/04/23 (!) 350 lb 1.3 oz (158.8 kg)     BP Readings from Last 3 Encounters:  07/19/23 118/68  03/17/23 118/83  03/04/23 118/70     Physical Exam- Limited  Constitutional:  Body mass index is 53.41 kg/m. , not in acute distress, normal state of mind Eyes:  EOMI, no  exophthalmos Musculoskeletal: no gross deformities, strength intact in all four extremities, no gross restriction of joint movements Skin:  no rashes, no hyperemia Neurological: no tremor with outstretched hands   Diabetic Foot Exam - Simple   No data filed     CMP     Component Value Date/Time   NA 138 03/04/2023 1359   K 4.5 03/04/2023 1359   CL 97 03/04/2023 1359   CO2 24 03/04/2023 1359   GLUCOSE 187 (H) 03/04/2023 1359   GLUCOSE 148 (H) 12/21/2019 1252   BUN 10 03/04/2023 1359   CREATININE 0.87 03/04/2023 1359   CREATININE 0.71 12/21/2019 1252   CALCIUM 9.8 03/04/2023 1359   PROT 8.1 03/04/2023 1359   ALBUMIN 4.6 03/04/2023 1359   AST 32 03/04/2023 1359   ALT 33 (H) 03/04/2023 1359   ALKPHOS 74 03/04/2023 1359   BILITOT 0.7 03/04/2023 1359   GFRNONAA 77 02/13/2019 1207   GFRAA 90 02/13/2019 1207    Diabetic Labs (most recent): Lab Results  Component Value Date   HGBA1C 9.5 (A) 07/19/2023   HGBA1C 7.7 (A) 03/17/2023   HGBA1C 7.1 (A) 11/11/2022   MICROALBUR 30 05/22/2021   MICROALBUR 7.9 12/21/2019   MICROALBUR 63.8 (H) 04/12/2018    Lipid Panel     Component Value Date/Time   CHOL 165 03/04/2023 1359   TRIG 123 03/04/2023 1359   HDL 38 (L) 03/04/2023 1359   CHOLHDL 4.3 03/04/2023 1359   CHOLHDL 3.5 02/01/2020 1415   VLDL 23 07/13/2016 1430   LDLCALC 105 (H) 03/04/2023 1359   LDLCALC 75 02/01/2020 1415      Lab Results  Component Value Date   TSH 0.722 07/15/2022   TSH 0.955 06/24/2021   TSH 0.727  12/05/2020   TSH 0.69 02/13/2019   TSH 1.23 05/09/2018   TSH 0.95 07/13/2016   TSH 1.26 07/10/2015   TSH 1.382 11/17/2013   TSH 0.519 04/14/2012   TSH 1.525 11/07/2009      Lipid Panel     Component Value Date/Time   CHOL 165 03/04/2023 1359   TRIG 123 03/04/2023 1359   HDL 38 (L) 03/04/2023 1359   CHOLHDL 4.3 03/04/2023 1359   CHOLHDL 3.5 02/01/2020 1415   VLDL 23 07/13/2016 1430   LDLCALC 105 (H) 03/04/2023 1359   LDLCALC 75 02/01/2020 1415     Assessment & Plan:   1) Type 2 Diabetes without complication, with long-term current use of insulin  - Tracey [name] has currently uncontrolled symptomatic type 2 DM since 68 years of age.  She presents today with her CGM and logs showing at target glycemic profile overall.  Her POCT A1c today is 9.5%, increasing slightly from last visit of 7.7%.  Analysis of her CGM shows TIR 81%, TAR 19%, TBR 0%.  She denies any hypoglycemia.  She has been out of her Ozempic for 2 weeks, will be picking up refill today.  She notes her CGM upgrade would cost her out of pocket, so she prefers to stick with the same regimen for now.     -her diabetes is complicated by obesity/sedentary life and LILLYN WIECZOREK remains at a high risk for more acute and chronic complications which include CAD, CVA, CKD, retinopathy, and neuropathy. These are all discussed in detail with the patient.  - Nutritional counseling repeated at each appointment due to patients tendency to fall back in to old habits.  - The patient admits there is a room for improvement in their diet and  drink choices. -  Suggestion is made for the patient to avoid simple carbohydrates from their diet including Cakes, Sweet Desserts / Pastries, Ice Cream, Soda (diet and regular), Sweet Tea, Candies, Chips, Cookies, Sweet Pastries, Store Bought Juices, Alcohol in Excess of 1-2 drinks a day, Artificial Sweeteners, Coffee Creamer, and "Sugar-free" Products. This will help patient to have stable  blood glucose profile and potentially avoid unintended weight gain.   - I encouraged the patient to switch to unprocessed or minimally processed complex starch and increased protein intake (animal or plant source), fruits, and vegetables.   - Patient is advised to stick to a routine mealtimes to eat 3 meals a day and avoid unnecessary snacks (to snack only to correct hypoglycemia).  - I have approached her with the following individualized plan to manage diabetes and patient agrees:   -She is advised to continue U500 35 units TID with meals if glucose is above 90 and she is eating, continue Ozempic to 2 mg SQ weekly, and continue her Metformin 1000 mg po twice daily with meals for now.   -Encouraged patient to monitor blood glucose 4 times per day (using her CGM), before meals and at bedtime and notify the clinic if blood glucose levels are less than 70 or greater than 300 for 3 tests in a row.  She has benefited greatly from CGM device but was denied from upgrade to the [place] 3.  -She is also advised not to inject insulin without proper glucose monitoring.  2) BP/HTN:  Her blood pressure is controlled to target.  She typically waits to take her Spironolactone 25 mg until she gets home so she won't have to urinate as often.  She is advised to continue Metoprolol 50 mg po twice daily, Olmesartan 20 mg po daily, and continue Spironolactone 25 mg po daily.    3) Lipids/HPL:  Her recent lipid panel from 07/15/22 shows controlled LDL of 90.  She is advised to continue Crestor 20 mg po daily at bedtime.  Side effects and precautions discussed with her.  She has annual exam with her PCP coming up with labs.  4) Vitamin D Deficiency: Her recent vitamin D level was 24.1 on 07/15/22.  She has completed replenishment with ergocalciferol and is now taking OTC Vitamin D3 5000 units daily as maintenance dose.  She is advised to continue for now.    5) Chronic Care/Health Maintenance: -she is on Statin  medications and is encouraged to continue to follow up with Ophthalmology, Dentist, Podiatrist at least yearly or according to recommendations, and advised to stay away from smoking. I have recommended yearly flu vaccine and pneumonia vaccination at least every 5 years; moderate intensity exercise for up to 150 minutes weekly; and  sleep for at least 7 hours a day.  - I advised patient to maintain close follow up with Kerri Perches, MD for primary care needs.      I spent  47  minutes in the care of the patient today including review of labs from CMP, Lipids, Thyroid Function, Hematology (current and previous including abstractions from other facilities); face-to-face time discussing  her blood glucose readings/logs, discussing hypoglycemia and hyperglycemia episodes and symptoms, medications doses, her options of short and long term treatment based on the latest standards of care / guidelines;  discussion about incorporating lifestyle medicine;  and documenting the encounter. Risk reduction counseling performed per USPSTF guidelines to reduce obesity and cardiovascular risk factors.     Please refer to Patient  Instructions for Blood Glucose Monitoring and Insulin/Medications Dosing Guide"  in media tab for additional information. Please  also refer to " Patient Self Inventory" in the Media  tab for reviewed elements of pertinent patient history.  Edward Jolly participated in the discussions, expressed understanding, and voiced agreement with the above plans.  All questions were answered to her satisfaction. she is encouraged to contact clinic should she have any questions or concerns prior to her return visit.   Follow up plan: - Return in about 4 months (around 11/18/2023) for Diabetes F/U with A1c in office, No previsit labs, Bring meter and logs.   [name], [facility] [address] [place], [place] [address] Phone: [phone] Fax:  [phone]  07/19/2023, 2:04 PM

## 2023-07-29 ENCOUNTER — Encounter: Payer: Self-pay | Admitting: *Deleted

## 2023-07-29 ENCOUNTER — Telehealth: Payer: Self-pay

## 2023-07-29 ENCOUNTER — Ambulatory Visit: Payer: Self-pay | Admitting: *Deleted

## 2023-07-29 ENCOUNTER — Other Ambulatory Visit: Payer: Self-pay

## 2023-07-29 NOTE — Telephone Encounter (Signed)
"  Bebe Bourdon, RN  P [facility] Triage ----- Message ----- From: Ethelene Herald, RN Sent: 07/29/2023   3:26 PM EDT To: Catar[facility] Clines N[facility] Triage Subject: Needs scheduling for f/u and symptoms          Good Afternoon,  Ms Froh is due for a follow-up with Dr Avanell Bob. When I spoke with her today she mentioned that she is having some increased SOB and dizziness with exertion. Improves with rest. She reports infrequent sharp chest pain for which she was advised to take two omeprazole for in the past. She hasn't tried that recently. Last episode was last week. She has also gained about 13 lbs over the past few months despite being on ozempic. She hasn't noticed any fluid retention but her daughter mentioned to her that her right leg looked a little swollen. Denies any skin tightness, blisters, or weeping. Also of note, she stated that "I feel like something is weighing down on me." She says she isn't worried or sad about anything but has this general bad feeling. She did say that it was getting a little better. I can refer to LCSW for counseling if she desires, but I wanted to mention it in case it has health related root cause.  Can someone call to schedule her an appointment?  Please let me know if I can be of any assistance.  Thank you,  Michele Ahle, RN, BSN [facility] RN Care Manager Direct Dial: [phone]  Fax: [phone]"

## 2023-07-29 NOTE — Telephone Encounter (Signed)
 Spoke to patient and she confirms messages from Charity fundraiser. She further admitted that her shortness of breath is significant enough at times that it is difficult to stand to do her dishes. Offered pt appointment 4/18 at [facility] and pt reports that she would not be able to come tomorrow. Advised patient that if her symptoms continue to worsen to please go to ER for evaluation. Pt agreed with plan. Pt still needs appointment in office for evaluation.

## 2023-07-30 ENCOUNTER — Encounter: Admitting: *Deleted

## 2023-07-30 NOTE — Telephone Encounter (Signed)
 Patient returned RN's call.  Patient has appointment scheduled on 4/22 with B. Strader in [place].

## 2023-07-30 NOTE — Telephone Encounter (Signed)
 Pt contacted and advised. Pt agrees with plan of care.

## 2023-07-30 NOTE — Telephone Encounter (Signed)
 Left message to call back

## 2023-07-30 NOTE — Telephone Encounter (Signed)
 The last time she was in cardiology clinic she saw me in Aug 2022 (2.5 years ago)  She should make sure she sees PCP for exam/labs She should have appt in cardiology  I will not order labs until seen

## 2023-08-02 NOTE — Progress Notes (Deleted)
 Cardiology Office Note:  .   Date:  08/02/2023  ID:  Tracey [name], DOB 11-06-1955, MRN [id number] PCP: Towanda Fret, MD   HeartCare Providers Cardiologist:  Ola Berger, MD {  History of Present Illness: Tracey [name]   Tracey [name] is a 68 y.o. female with pmhx of severe morbid obesity, baseline sinus tachycardia, HTN, HLD (followed by PCP), PE 1999 s/p TAH,  Chronic DOE, moderate OSA, GERD, arthritis, PSVT, DM who presents for 1 year follow-up.   Last seen in OV with [name], PA-C in 07/2022 for overdue f/u on sinus tachycardia. No cardiac complaints at this visit. Noted 50 lbs weight loss on Ozempic  and improvement in baseline HR, 97. No medications changes. Continued on daily doses of ASA 81 mg, Olmesartan  20 mg, Crestor  20 mg, Spironolactone  25 mg, and Lopressor  50 mg BID.   Patient spoke with nurse on 07/29/2023, which noted increased SOB and dizziness with exertion, which improves with rest. Also noted infrequent sharp CP and gaining 13 lbs over past few month despite continued Ozempic  use.  Nuclear stress test? Repeat ECHO.   ECHO 2020:  LVEF >65%, mildly increased LV wall thickness, mild aortic calcification   Sinus Tachycardia  SVT - Heart monitor 12/2020: HR 50-150bpm, NSR, short bursts SVT longest 11 sec, rare PACs, PVCs.  - HR today:  - Continued on Lopressor  50 mg BID  HTN  - BP this visit:  - 02/2023: K 4.5, Cr 0.87 - Continue on daily doses of Olmesartan  20 mg, Spironolactone  25 mg, and Lopressor  50 mg BID.   HLD  - 02/2023: LDL 105, AST 32, ALT 33 - continue on daily Crestor  20 mg   Morbid obesity with OSA  - 2018: moderate OSA with plan for titration but was lost to follow up. Given referral to [facility] pulm on 07/2022 for updated sleep study. Has not yet followed up ???  DM  - 07/2023: A1C 9.5 - managed by PCP   Studies Reviewed: .        Dobutamine  ECHO stress test 05/2012 Impressions:  - Normal study after pharmacologic stress.   ECHO  IMPRESSIONS 05/2018  1. The left ventricle has hyperdynamic systolic function of >65%. The  cavity size is normal. There is mildly increased left ventricular wall  thickness. Indeterminate diastolic function.   2. The right ventricle has normal systolic function. The cavity in normal  in size. There is no increase in right ventricular wall thickness. Right  ventricular systolic pressure could not be assessed.   3. The aortic valve is tricuspid. There is mild aortic annular  calcification noted.   4. The mitral valve is normal in structure.   5. The pulmonic valve is grossly normal.   6. The aortic root is normal in size and structure.   Heart Monitor 12/2020 Predominant rhythm:  Sinus rhythm  Rates 50 to 150 bpm   Average HR 89 bpm   Short bursts SVT   Longest 11 sec with fastest rate 176 bpm    Rare PVC, PAC      Risk Assessment/Calculations:   {Does this patient have ATRIAL FIBRILLATION?:[phone]} No BP recorded.  {Refresh Note OR Click here to enter BP  :1}***       Physical Exam:   VS:  There were no vitals taken for this visit.   Wt Readings from Last 3 Encounters:  07/19/23 (!) 372 lb 3.2 oz (168.8 kg)  03/17/23 (!) 359 lb 12.8 oz (163.2 kg)  03/04/23 Tracey [name])  350 lb 1.3 oz (158.8 kg)    GEN: Well nourished, well developed in no acute distress NECK: No JVD; No carotid bruits CARDIAC: ***RRR, no murmurs, rubs, gallops RESPIRATORY:  Clear to auscultation without rales, wheezing or rhonchi  ABDOMEN: Soft, non-tender, non-distended EXTREMITIES:  No edema; No deformity   ASSESSMENT AND PLAN: .   ***    {Are you ordering a CV Procedure (e.g. stress test, cath, DCCV, TEE, etc)?   Press F2        :[id number]}  Dispo: ***  Signed, Metta Actis, PA-C

## 2023-08-03 ENCOUNTER — Ambulatory Visit: Admitting: Student

## 2023-08-03 NOTE — Progress Notes (Signed)
 Cardiology Office Note:  .   Date:  08/16/2023  ID:  Tracey [name], DOB 04-21-1955, MRN [id number] PCP: Tracey Fret, MD  [facility] Providers Cardiologist:  Tracey Berger, MD    History of Present Illness: Tracey [name]   Tracey [name] is a 68 y.o. female  with a history of CP, DM, HTN, PE HL and morbid obesity. Dobutamine  echo in 2014 normal Echo in 2020 LVEF normal.  Patient comes in for regular f/u. Twice last month she has gas like pain in her chest after she eats associated with nausea and abdominal discomfort that lasts a couple minutes. She gets relief froma medication prescribed by PCP but she can't remember what it is.She has chronic DOE walking short distance that is unchanged. No chest pressure, palpitations, edema. She's on Ozempic  and lost 25 lbs but gained it back over the holidays. She ran out of ozempic  for a month but is back on it. She has a plate that vibrates her legs as her only form of exercise. She says her legs give out and she falls. Has to sit down to cook at times. When she stands too long and her legs get weak she gets dizzy and sweaty.She has to crawl 14 steps to go to bed in her apartment. She says her legs would give.   ROS:    Studies Reviewed: Tracey [name]    EKG Interpretation Date/Time:  Monday Aug 16 2023 08:25:09 EDT Ventricular Rate:  106 PR Interval:  188 QRS Duration:  84 QT Interval:  358 QTC Calculation: 475 R Axis:   79  Text Interpretation: Sinus tachycardia When compared with ECG of 13-May-2015 15:57, T wave inversion no longer evident in Inferior leads Confirmed by Tracey [name] [phone]) on 08/16/2023 8:34:10 AM    Prior CV Studies:    Dobutamine  ECHO stress test 05/2012 Impressions:  - Normal study after pharmacologic stress.   ECHO IMPRESSIONS 05/2018  1. The left ventricle has hyperdynamic systolic function of >65%. The  cavity size is normal. There is mildly increased left ventricular wall  thickness. Indeterminate diastolic function.    2. The right ventricle has normal systolic function. The cavity in normal  in size. There is no increase in right ventricular wall thickness. Right  ventricular systolic pressure could not be assessed.   3. The aortic valve is tricuspid. There is mild aortic annular  calcification noted.   4. The mitral valve is normal in structure.   5. The pulmonic valve is grossly normal.   6. The aortic root is normal in size and structure.   Heart Monitor 12/2020 Predominant rhythm:  Sinus rhythm  Rates 50 to 150 bpm   Average HR 89 bpm   Short bursts SVT   Longest 11 sec with fastest rate 176 bpm    Rare PVC, PAC       Risk Assessment/Calculations:             Physical Exam:   VS:  BP 124/70 (BP Location: Left Arm, Patient Position: Sitting, Cuff Size: Large)   Pulse 100   Ht 5\' 10"  (1.778 m)   Wt (!) 374 lb (169.6 kg)   SpO2 98%   BMI 53.66 kg/m    Wt Readings from Last 3 Encounters:  08/16/23 (!) 374 lb (169.6 kg)  07/19/23 (!) 372 lb 3.2 oz (168.8 kg)  03/17/23 (!) 359 lb 12.8 oz (163.2 kg)    GEN: Obese, in no acute distress NECK: No JVD; No carotid  bruits CARDIAC:  RRR, no murmurs, rubs, gallops RESPIRATORY:  Clear to auscultation without rales, wheezing or rhonchi  ABDOMEN: Soft, non-tender, non-distended EXTREMITIES:  No edema; No deformity, brawny changes  ASSESSMENT AND PLAN: .    HTN-BP well controlled  HLD-LDL 105 02/2023 on crestor  managed by PCP. Being diabetic would like LDL lower  SVT/sinus tachycardia on metoprolol . No palpitations but having dizziness when standing. Will place 2 week Zio to make sure she's not having any arrhythmias. Suspect a lot may be due to deconditioning.   History of PE  Morbid obesity-on ozempic  and had lost 25 lbs but gained it back. Weight loss recommended. She can't exercise-see below. Wondering if she'd benefit from PT. To discuss with PCP.  Weakness/deconditioning-she may benefit from physical therapy. She has an appt with Dr.  Rodolph [name] this month and will talk to her about it.        Dispo: F/u in 1 yr. Dr. Avanell [name]  Signed, Tracey Flake, PA-C

## 2023-08-04 ENCOUNTER — Telehealth: Payer: Self-pay

## 2023-08-04 NOTE — Telephone Encounter (Signed)
 Patient was identified as falling into the [place] Measure - Diabetes.   Patient was: Appointment already scheduled for:  09/07/23.

## 2023-08-05 ENCOUNTER — Encounter: Payer: Self-pay | Admitting: *Deleted

## 2023-08-05 ENCOUNTER — Other Ambulatory Visit: Payer: Self-pay | Admitting: *Deleted

## 2023-08-10 ENCOUNTER — Telehealth: Payer: Self-pay

## 2023-08-10 NOTE — Patient Outreach (Signed)
 Complex Care Management   Visit Note  07/29/2023  Name:  Tracey [name] MRN: [id number] DOB: Oct 16, 1955  Situation: Referral received for Complex Care Management related to Diabetes with Complications and diastolic dysfunction.  I obtained verbal consent from Patient.  Visit completed with Ames Bakes on the phone  Background:   Past Medical History:  Diagnosis Date   Arthritis    Diabetes mellitus without complication (HCC)    Diastolic dysfunction 10/2009   Grade 1   GERD (gastroesophageal reflux disease)    Hyperlipidemia    Hyperlipidemia    Hypertension 04/15/2012   Morbid obesity (HCC)    PE (pulmonary embolism)    Pulmonary emboli (HCC) 1999   post TAH   Sleep apnea     Assessment: Patient Reported Symptoms:  Cognitive Cognitive Status: Alert and oriented to person, place, and time, Normal speech and language skills Cognitive/Intellectual Conditions Management [RPT]: None reported or documented in medical history or problem list   Health Maintenance Behaviors: Annual physical exam, Healthy diet, Sleep adequate, Immunizations Healing Pattern: Unsure Health Facilitated by: Healthy diet, Rest  Neurological Neurological Review of Symptoms: Numbness, Other: [place] Neurological Symptoms/Conditions [RPT]: bilateral foot pain Neurological Conditions:  (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 Episodes of intermittent sharp chest pain. Has been told to take 2 omeprazole  for this in the past. Has not found it helpful. Does patient have uncontrolled  Hypertension?: No Cardiovascular Conditions: Hypertension, High blood cholesterol, Dysrhythmia (diastolic dysfunction) Cardiovascular Management Strategies: Medication therapy, Diet modification Cardiovascular Self-Management Outcome: 3 (uncertain) Cardiovascular Comment: RN contacted cardiology office and requested that they reach out to patient to discuss symptoms and move follow-up appointment to a sooner date. Advised to seek emergency medical attention if needed.  Respiratory Respiratory Symptoms Reported: Shortness of breath Respiratory Conditions: Sleep disordered breathing Respiratory Self-Management Outcome: 3 (uncertain)  Endocrine Patient reports the following symptoms related to hypoglycemia or hyperglycemia : No symptoms reported Is patient diabetic?: Yes Is patient checking blood sugars at home?: Yes Endocrine Conditions: Diabetes Endocrine Management Strategies: Diet modification, Medication therapy, Routine screening Endocrine Self-Management Outcome: 3 (uncertain) Endocrine Comment: followed by endocrinologist  Gastrointestinal Gastrointestinal Symptoms Reported: Obesity, Other Other Gastrointestinal Symptoms: GERD Gastrointestinal Conditions: Reflux/heartburn Gastrointestinal Management Strategies: Diet modification, Medication therapy Gastrointestinal Self-Management Outcome: 4 (good) Nutrition Risk Screen (CP): No indicators present  Genitourinary Genitourinary Symptoms Reported: No symptoms reported    Integumentary Integumentary Symptoms Reported: No symptoms reported    Musculoskeletal Musculoskelatal Symptoms Reviewed: Difficulty walking, Weakness Musculoskeletal Conditions: Back pain, Other, Bursitis Other Musculoskeletal Conditions: onychomycosis Musculoskeletal Management Strategies: Routine screening, Medication therapy Musculoskeletal Self-Management Outcome: 3 (uncertain) Musculoskeletal Comment: sees podiatry regularly Falls in the past year?: No Number  of falls in past year: 1 or less Was there an injury with Fall?: No Fall Risk Category Calculator: 0 Patient Fall Risk Level: Low Fall Risk Patient at Risk for Falls Due to: Impaired mobility, Impaired balance/gait Fall risk Follow up: Falls evaluation completed, Falls prevention discussed  Psychosocial Additional Psychological Details: "Feels like something is weighing down on me." Denies feeling anxious or depressed. Included in staff message to cardiologist Behavioral Health Self-Management Outcome: 3 (uncertain) Major Change/Loss/Stressor/Fears (CP): Denies Quality  of Family Relationships: helpful, involved, supportive Do you feel physically threatened by others?: No      07/29/2023    3:27 PM  Depression screen PHQ 2/9  Decreased Interest 0  Down, Depressed, Hopeless 0  PHQ - 2 Score 0    There were no vitals filed for this visit.  Medications Reviewed Today     Reviewed by Ethelene Herald, RN (Registered Nurse) on 07/29/23 at 1511  Med List Status: <None>   Medication Order Taking? Sig Documenting Provider Last Dose Status Informant  acetaminophen  (TYLENOL ) 500 MG tablet [id number]  Take 500 mg by mouth every 6 (six) hours as needed for moderate pain. [provider]  Active   aspirin  EC 81 MG tablet [id number]  Take 1 tablet (81 mg total) by mouth daily. Towanda Fret, MD  Active Self  blood glucose meter kit and supplies KIT [id number]  Dispense based on patient and insurance preference. Use up to four times daily as directed. Wendel Hals, NP  Active   glucose blood (ACCU-CHEK GUIDE TEST) test strip [id number]  Use as instructed to monitor glucose 4 times daily- use as backup for CGM Wendel Hals, NP  Active   insulin  regular human CONCENTRATED (HUMULIN  R U-500 KWIKPEN) 500 UNIT/ML KwikPen [id number]  Inject 35 Units into the skin 3 (three) times daily with meals. Wendel Hals, NP  Active   Lancets MISC [id number]  Use to test BG qid. Dx : E11.65.  Softclix Lancets Nida, Gebreselassie W, MD  Active   metFORMIN  (GLUCOPHAGE ) 1000 MG tablet [id number]  Take 1 tablet (1,000 mg total) by mouth 2 (two) times daily with a meal. Wendel Hals, NP  Active   metoprolol  tartrate (LOPRESSOR ) 50 MG tablet [id number]  TAKE ONE TABLET (50MG  TOTAL) BY MOUTH TWO TIMES DAILY Dunn, Dayna N, PA-C  Active   mupirocin  ointment (BACTROBAN ) 2 % [id number]  Apply to affected area once daily. Luella Sager, DPM  Active   olmesartan  (BENICAR ) 20 MG tablet [id number]  TAKE ONE TABLET (20MG  TOTAL) BY MOUTH DAILY Towanda Fret, MD  Active   omeprazole  (PRILOSEC) 20 MG capsule [id number]  TAKE ONE CAPSULE BY MOUTH DAILY Towanda Fret, MD  Active   rosuvastatin  (CRESTOR ) 20 MG tablet [id number]  TAKE ONE TABLET (20MG  TOTAL) BY MOUTH DAILY Towanda Fret, MD  Active   Semaglutide , 2 MG/DOSE, 8 MG/3ML SOPN [id number]  Inject 2 mg as directed once a week. Wendel Hals, NP  Active   spironolactone  (ALDACTONE ) 25 MG tablet [id number]  TAKE ONE (1) TABLET BY MOUTH EVERY DAY Towanda Fret, MD  Active   UNIFINE PENTIPS 31G X 8 MM MISC [id number]  USE FOR INSULIN  INJECTIONS THREE TIMES DAILY. Wendel Hals, NP  Active             Recommendation:   Specialty provider follow-up talk with cardiology office about symptoms and move appointment up Seek emergency medical attention if necessary Talk with PCP about PREP and managing neuropathy in feet  Follow Up Plan:   Telephone follow up appointment date/time:  08/05/23 at 12:30  Michele Ahle, RN, BSN [place]  [facility] RN Care Manager Direct Dial: [phone]  Fax: [phone]

## 2023-08-10 NOTE — Progress Notes (Signed)
 Complex Care Management Note  Care Guide Note 08/10/2023 Name: ANICA FORESTER MRN: [id number] DOB: 05/31/1955  JEANNETE WESOLOWSKI is a 69 y.o. year old female who sees Towanda Fret, MD for primary care. I reached out to Ames Bakes by phone today to offer complex care management services.  Ms. Summerton was given information about Complex Care Management services today including:   The Complex Care Management services include support from the care team which includes your Nurse Care Manager, Clinical Social Worker, or Pharmacist.  The Complex Care Management team is here to help remove barriers to the health concerns and goals most important to you. Complex Care Management services are voluntary, and the patient may decline or stop services at any time by request to their care team member.   Complex Care Management Consent Status: Patient did not agree to participate in complex care management services at this time.  Follow up plan:  Per patient, she will follow up as needed.  Encounter Outcome:  Patient Refused  [facility], [facility] Care Management Assistant Direct Dial: [phone]  Fax: [phone]

## 2023-08-16 ENCOUNTER — Ambulatory Visit: Attending: Physician Assistant

## 2023-08-16 ENCOUNTER — Encounter: Payer: Self-pay | Admitting: Physician Assistant

## 2023-08-16 ENCOUNTER — Ambulatory Visit: Attending: Physician Assistant | Admitting: Physician Assistant

## 2023-08-16 ENCOUNTER — Other Ambulatory Visit: Payer: Self-pay | Admitting: Physician Assistant

## 2023-08-16 VITALS — BP 124/70 | HR 100 | Ht 70.0 in | Wt 374.0 lb

## 2023-08-16 DIAGNOSIS — I471 Supraventricular tachycardia, unspecified: Secondary | ICD-10-CM | POA: Diagnosis not present

## 2023-08-16 DIAGNOSIS — I10 Essential (primary) hypertension: Secondary | ICD-10-CM | POA: Diagnosis not present

## 2023-08-16 DIAGNOSIS — R000 Tachycardia, unspecified: Secondary | ICD-10-CM

## 2023-08-16 DIAGNOSIS — E7849 Other hyperlipidemia: Secondary | ICD-10-CM | POA: Insufficient documentation

## 2023-08-16 DIAGNOSIS — Z86711 Personal history of pulmonary embolism: Secondary | ICD-10-CM | POA: Diagnosis not present

## 2023-08-16 NOTE — Patient Instructions (Addendum)
 Medication Instructions:  Your physician recommends that you continue on your current medications as directed. Please refer to the Current Medication list given to you today.  Labwork: none  Testing/Procedures: 14 Day ZIO XT.  Please see instructions in package  Follow-Up: Your physician recommends that you schedule a follow-up appointment in: 1 year. You will receive a reminder call in about 8-10 months reminding you to schedule your appointment. If you don't receive this call, please contact our office.  Any Other Special Instructions Will Be Listed Below (If Applicable).  If you need a refill on your cardiac medications before your next appointment, please call your pharmacy.

## 2023-08-23 ENCOUNTER — Other Ambulatory Visit: Payer: Self-pay | Admitting: *Deleted

## 2023-08-23 ENCOUNTER — Encounter: Payer: Self-pay | Admitting: *Deleted

## 2023-08-24 ENCOUNTER — Other Ambulatory Visit: Payer: Self-pay | Admitting: Nurse Practitioner

## 2023-08-24 NOTE — Patient Outreach (Signed)
 Complex Care Management   Visit Note  08/23/2023  Name:  TREA CHRISP MRN: [id number] DOB: 1955/10/23  Situation: Referral received for Complex Care Management related to Diabetes with Complications I obtained verbal consent from Patient.  Visit completed with Annamae Barrett Rollinson the phone  Background:   Past Medical History:  Diagnosis Date   Arthritis    Diabetes mellitus without complication (HCC)    Diastolic dysfunction 10/2009   Grade 1   GERD (gastroesophageal reflux disease)    Hyperlipidemia    Hyperlipidemia    Hypertension 04/15/2012   Morbid obesity (HCC)    PE (pulmonary embolism)    Pulmonary emboli (HCC) 1999   post TAH   Sleep apnea     Assessment: Patient Reported Symptoms:  Cognitive Cognitive Status: Alert and oriented to person, place, and time, Normal speech and language skills Cognitive/Intellectual Conditions Management [RPT]: None reported or documented in medical history or problem list   Health Maintenance Behaviors: Annual physical exam, Sleep adequate, Immunizations Healing Pattern: Unsure Health Facilitated by: Rest  Neurological Neurological Review of Symptoms: Not assessed    HEENT HEENT Symptoms Reported: Not assessed      Cardiovascular Cardiovascular Symptoms Reported: No symptoms reported Does patient have uncontrolled Hypertension?: No Cardiovascular Conditions: Hypertension, Dysrhythmia Cardiovascular Management Strategies: Medication therapy, Routine screening Cardiovascular Self-Management Outcome: 3 (uncertain) Cardiovascular Comment: Has seen cardiology since last telephone visit and reports that symptoms have resolved.  Respiratory Respiratory Symptoms Reported: No symptoms reported    Endocrine Patient reports the following symptoms related to hypoglycemia or hyperglycemia : No symptoms reported Is patient diabetic?: Yes Is patient checking blood sugars at home?: Yes Endocrine Conditions: Diabetes Endocrine Management  Strategies: Medication therapy, Routine screening, Diet modification Endocrine Self-Management Outcome: 4 (good) Endocrine Comment: Enrolled in Health Equity Plan Program but declines further telephone follow-ups at this time. Agreed to educational materials through the mail.  Gastrointestinal Gastrointestinal Symptoms Reported: Not assessed      Genitourinary Genitourinary Symptoms Reported: Not assessed    Integumentary Integumentary Symptoms Reported: No symptoms reported    Musculoskeletal Musculoskelatal Symptoms Reviewed: Not assessed        Psychosocial Psychosocial Symptoms Reported: Not assessed            07/29/2023    3:27 PM  Depression screen PHQ 2/9  Decreased Interest 0  Down, Depressed, Hopeless 0  PHQ - 2 Score 0    There were no vitals filed for this visit.  Medications Reviewed Today     Reviewed by Ethelene Herald, RN (Registered Nurse) on 08/24/23 at 1626  Med List Status: <None>   Medication Order Taking? Sig Documenting Provider Last Dose Status Informant  acetaminophen  (TYLENOL ) 500 MG tablet [id number] Yes Take 500 mg by mouth every 6 (six) hours as needed for moderate pain. [provider] Taking Active   aspirin  EC 81 MG tablet [id number] Yes Take 1 tablet (81 mg total) by mouth daily. Towanda Fret, MD Taking Active Self  blood glucose meter kit and supplies KIT [id number] Yes Dispense based on patient and insurance preference. Use up to four times daily as directed. Wendel Hals, NP Taking Active   glucose blood (ACCU-CHEK GUIDE TEST) test strip [id number] Yes Use as instructed to monitor glucose 4 times daily- use as backup for CGM Wendel Hals, NP Taking Active   insulin  regular human CONCENTRATED (HUMULIN  R U-500 KWIKPEN) 500 UNIT/ML KwikPen [id number] Yes Inject 35 Units into the skin 3 (three) times daily with  meals. Wendel Hals, NP Taking Active   Lancets MISC [id number] Yes Use to test BG qid. Dx : E11.65. Softclix  Lancets Nida, Gebreselassie W, MD Taking Active   metFORMIN  (GLUCOPHAGE ) 1000 MG tablet [id number] Yes Take 1 tablet (1,000 mg total) by mouth 2 (two) times daily with a meal. Wendel Hals, NP Taking Active   metoprolol  tartrate (LOPRESSOR ) 50 MG tablet [id number] Yes TAKE ONE TABLET (50MG  TOTAL) BY MOUTH TWO TIMES DAILY Dunn, Dayna N, PA-C Taking Active   mupirocin  ointment (BACTROBAN ) 2 % [id number] No Apply to affected area once daily.  Patient not taking: Reported on 08/24/2023   Luella Sager, DPM Not Taking Active   olmesartan  (BENICAR ) 20 MG tablet [id number] Yes TAKE ONE TABLET (20MG  TOTAL) BY MOUTH DAILY Towanda Fret, MD Taking Active   omeprazole  (PRILOSEC) 20 MG capsule [id number] Yes TAKE ONE CAPSULE BY MOUTH DAILY Towanda Fret, MD Taking Active   rosuvastatin  (CRESTOR ) 20 MG tablet [id number] Yes TAKE ONE TABLET (20MG  TOTAL) BY MOUTH DAILY Towanda Fret, MD Taking Active   Semaglutide , 2 MG/DOSE, 8 MG/3ML SOPN [id number] Yes Inject 2 mg as directed once a week. Wendel Hals, NP Taking Active   spironolactone  (ALDACTONE ) 25 MG tablet [id number] Yes TAKE ONE (1) TABLET BY MOUTH EVERY DAY Towanda Fret, MD Taking Active   UNIFINE PENTIPS 31G X 8 MM MISC [id number] Yes USE FOR INSULIN  INJECTIONS THREE TIMES DAILY. Wendel Hals, NP Taking Active             Recommendation:   PCP Follow-up Specialty provider follow-up :cardiology Review mailed handouts on diabetes management topics  Follow Up Plan:   Patient has declined further telephone engagement with the Health Equity Plan program Chronic Care Manager but has agreed to receiving mailed educational materials every 3 months.   Michele Ahle, RN, BSN [place]  [facility] RN Care Manager Direct Dial: [phone]  Fax: [phone]

## 2023-08-24 NOTE — Patient Instructions (Signed)
 Visit Information  Thank you for taking time to visit with me today. Please don't hesitate to contact me if I can be of assistance to you before our next scheduled appointment.  Declined further telephone follow-ups but agreed to mailed educational materials every 3 months.  Please call the care guide team at [phone] if you need to cancel, schedule, or reschedule an appointment.   Please call the [facility]: [phone] call 911 if you are experiencing a Mental Health or Behavioral Health Crisis or need someone to talk to.  Michele Ahle, RN, BSN [place]  [facility] RN Care Manager Direct Dial: [phone]  Fax: [phone]

## 2023-09-06 DIAGNOSIS — R000 Tachycardia, unspecified: Secondary | ICD-10-CM | POA: Diagnosis not present

## 2023-09-07 ENCOUNTER — Encounter: Payer: Self-pay | Admitting: Family Medicine

## 2023-09-07 ENCOUNTER — Ambulatory Visit: Payer: Medicare Other | Admitting: Family Medicine

## 2023-09-07 VITALS — BP 138/80 | HR 114 | Resp 18 | Ht 70.0 in | Wt 368.1 lb

## 2023-09-07 DIAGNOSIS — M792 Neuralgia and neuritis, unspecified: Secondary | ICD-10-CM

## 2023-09-07 DIAGNOSIS — E782 Mixed hyperlipidemia: Secondary | ICD-10-CM

## 2023-09-07 DIAGNOSIS — I10 Essential (primary) hypertension: Secondary | ICD-10-CM

## 2023-09-07 DIAGNOSIS — E559 Vitamin D deficiency, unspecified: Secondary | ICD-10-CM | POA: Diagnosis not present

## 2023-09-07 DIAGNOSIS — E1169 Type 2 diabetes mellitus with other specified complication: Secondary | ICD-10-CM | POA: Diagnosis not present

## 2023-09-07 DIAGNOSIS — M5440 Lumbago with sciatica, unspecified side: Secondary | ICD-10-CM | POA: Diagnosis not present

## 2023-09-07 DIAGNOSIS — E669 Obesity, unspecified: Secondary | ICD-10-CM | POA: Diagnosis not present

## 2023-09-07 MED ORDER — GABAPENTIN 100 MG PO CAPS: 100.0000 mg | ORAL_CAPSULE | Freq: Three times a day (TID) | ORAL | 3 refills | Status: AC

## 2023-09-07 NOTE — Assessment & Plan Note (Signed)
 Reports back pain with lower extrtemity weakness, making standing and walking difficult, refer PT for 6 weeks

## 2023-09-07 NOTE — Patient Instructions (Addendum)
 F/U in 13 weeks, call if you need me sooner  Labs today urine ACR, lipid cmp and EGFR, CBC, TSH and vit D  New for nerve pain is gabapentin    Pls bring all medication to next visit, blood pressure is slightly high today   Careful not to fall, you are referred for twice weekly PT for 6 weeks due to unsteady gait and weakness  Thanks for choosing [facility], we consider it a privelige to serve you.

## 2023-09-08 ENCOUNTER — Ambulatory Visit: Payer: Self-pay | Admitting: Family Medicine

## 2023-09-09 LAB — CBC WITH DIFFERENTIAL/PLATELET
Basophils Absolute: 0.1 10*3/uL (ref 0.0–0.2)
Basos: 1 %
EOS (ABSOLUTE): 0.1 10*3/uL (ref 0.0–0.4)
Eos: 1 %
Hematocrit: 42.1 % (ref 34.0–46.6)
Hemoglobin: 13.6 g/dL (ref 11.1–15.9)
Immature Grans (Abs): 0.1 10*3/uL (ref 0.0–0.1)
Immature Granulocytes: 1 %
Lymphocytes Absolute: 3.6 10*3/uL — ABNORMAL HIGH (ref 0.7–3.1)
Lymphs: 33 %
MCH: 24.9 pg — ABNORMAL LOW (ref 26.6–33.0)
MCHC: 32.3 g/dL (ref 31.5–35.7)
MCV: 77 fL — ABNORMAL LOW (ref 79–97)
Monocytes Absolute: 0.6 10*3/uL (ref 0.1–0.9)
Monocytes: 5 %
Neutrophils Absolute: 6.7 10*3/uL (ref 1.4–7.0)
Neutrophils: 59 %
Platelets: 292 10*3/uL (ref 150–450)
RBC: 5.47 x10E6/uL — ABNORMAL HIGH (ref 3.77–5.28)
RDW: 18.1 % — ABNORMAL HIGH (ref 11.7–15.4)
WBC: 11.1 10*3/uL — ABNORMAL HIGH (ref 3.4–10.8)

## 2023-09-09 LAB — CMP14+EGFR
ALT: 29 IU/L (ref 0–32)
AST: 25 IU/L (ref 0–40)
Albumin: 4.5 g/dL (ref 3.9–4.9)
Alkaline Phosphatase: 75 IU/L (ref 44–121)
BUN/Creatinine Ratio: 17 (ref 12–28)
BUN: 13 mg/dL (ref 8–27)
Bilirubin Total: 0.4 mg/dL (ref 0.0–1.2)
CO2: 21 mmol/L (ref 20–29)
Calcium: 9.8 mg/dL (ref 8.7–10.3)
Chloride: 96 mmol/L (ref 96–106)
Creatinine, Ser: 0.78 mg/dL (ref 0.57–1.00)
Globulin, Total: 3.2 g/dL (ref 1.5–4.5)
Glucose: 233 mg/dL — ABNORMAL HIGH (ref 70–99)
Potassium: 4.4 mmol/L (ref 3.5–5.2)
Sodium: 138 mmol/L (ref 134–144)
Total Protein: 7.7 g/dL (ref 6.0–8.5)
eGFR: 84 mL/min/{1.73_m2} (ref >59)

## 2023-09-09 LAB — TSH: TSH: 0.889 u[IU]/mL (ref 0.450–4.500)

## 2023-09-09 LAB — MICROALBUMIN / CREATININE URINE RATIO
Creatinine, Urine: 140.2 mg/dL
Microalb/Creat Ratio: 58 mg/g{creat} — ABNORMAL HIGH (ref 0–29)
Microalbumin, Urine: 81.4 ug/mL

## 2023-09-09 LAB — VITAMIN D 25 HYDROXY (VIT D DEFICIENCY, FRACTURES): Vit D, 25-Hydroxy: 37.2 ng/mL (ref 30.0–100.0)

## 2023-09-09 LAB — LIPID PANEL
Chol/HDL Ratio: 3.7 ratio (ref 0.0–4.4)
Cholesterol, Total: 181 mg/dL (ref 100–199)
HDL: 49 mg/dL (ref >39)
LDL Chol Calc (NIH): 102 mg/dL — ABNORMAL HIGH (ref 0–99)
Triglycerides: 171 mg/dL — ABNORMAL HIGH (ref 0–149)
VLDL Cholesterol Cal: 30 mg/dL (ref 5–40)

## 2023-09-10 DIAGNOSIS — R000 Tachycardia, unspecified: Secondary | ICD-10-CM | POA: Diagnosis not present

## 2023-09-13 ENCOUNTER — Ambulatory Visit: Payer: Self-pay | Admitting: Physician Assistant

## 2023-09-13 ENCOUNTER — Encounter: Payer: Self-pay | Admitting: Family Medicine

## 2023-09-13 DIAGNOSIS — M792 Neuralgia and neuritis, unspecified: Secondary | ICD-10-CM | POA: Insufficient documentation

## 2023-09-13 NOTE — Progress Notes (Signed)
 Tracey [name]     MRN: [id number]      DOB: 10/03/1955  Chief Complaint  Patient presents with   Hypertension    Follow up     HPI Tracey [name] is here for follow up and re-evaluation of chronic medical conditions, medication management and review of any available recent lab and radiology data.  Preventive health is updated, specifically  Cancer screening and Immunization.   Questions or concerns regarding consultations or procedures which the PT has had in the interim are  addressed. The PT denies any adverse reactions to current medications since the last visit.  Denies polyuria, polydipsia, blurred vision , or hypoglycemic episodes. C/o generalized weakness and unsteady   gait  C/o nerve pain, pins and needles in soles of feet  ROS Denies recent fever or chills. Denies sinus pressure, nasal congestion, ear pain or sore throat. Denies chest congestion, productive cough or wheezing. Denies chest pains, palpitations and leg swelling Denies abdominal pain, nausea, vomiting,diarrhea or constipation.   Denies dysuria, frequency, hesitancy c/o incontinence. . Denies depression, anxiety or insomnia. Denies skin break down or rash.   PE  BP 138/80   Pulse (!) 114   Resp 18   Ht 5\' 10"  (1.778 m)   Wt (!) 368 lb 1.9 oz (167 kg)   SpO2 95%   BMI 52.82 kg/m   Patient alert and oriented and in no cardiopulmonary distress.  HEENT: No facial asymmetry, EOMI,     Neck decreased ROM  Chest: Clear to auscultation bilaterally.  CVS: S1, S2 no murmurs, no S3.Regular rate.  ABD: Soft non tender.   Ext: No edema  MS: decreased  ROM spine, shoulders, hips and knees.  Skin: Intact, no ulcerations or rash noted.  Psych: Good eye contact, normal affect. Memory intact not anxious or depressed appearing.  CNS: CN 2-12 intact, power,  normal throughout.no focal deficits noted.   Assessment & Plan  BACK PAIN, LUMBAR Reports back pain with lower extrtemity weakness, making  standing and walking difficult, refer PT for 6 weeks  Neuropathic pain Increased  bilateral foot pain , start gabapentin   Morbid obesity  Patient re-educated about  the importance of commitment to a  minimum of 150 minutes of exercise per week as able.  The importance of healthy food choices with portion control discussed, as well as eating regularly and within a 12 hour window most days. The need to choose "clean , green" food 50 to 75% of the time is discussed, as well as to make water  the primary drink and set a goal of 64 ounces water  daily.       09/07/2023    1:52 PM 08/16/2023    8:05 AM 07/19/2023    1:28 PM  Weight /BMI  Weight 368 lb 1.9 oz 374 lb 372 lb 3.2 oz  Height 5\' 10"  (1.778 m) 5\' 10"  (1.778 m) 5\' 10"  (1.778 m)  BMI 52.82 kg/m2 53.66 kg/m2 53.41 kg/m2    Weight reduction occurring which is great  Mixed hyperlipidemia Hyperlipidemia:Low fat diet discussed and encouraged.   Lipid Panel  Lab Results  Component Value Date   CHOL 181 09/07/2023   HDL 49 09/07/2023   LDLCALC 102 (H) 09/07/2023   TRIG 171 (H) 09/07/2023   CHOLHDL 3.7 09/07/2023     Needs to reduce dietary fat, no med change  Essential hypertension, benign Controlled, no change in medication DASH diet and commitment to daily physical activity for a minimum of 30 minutes  discussed and encouraged, as a part of hypertension management. The importance of attaining a healthy weight is also discussed.     09/07/2023    2:29 PM 09/07/2023    1:52 PM 08/16/2023    8:05 AM 07/19/2023    1:28 PM 03/17/2023   10:52 AM 03/04/2023    1:10 PM 02/01/2023   10:15 AM  BP/Weight  Systolic BP 138 153 124 118 118 118 --  Diastolic BP 80 81 70 68 83 70 --  Wt. (Lbs)  368.12 374 372.2 359.8 350.08 356  BMI  52.82 kg/m2 53.66 kg/m2 53.41 kg/m2 51.63 kg/m2 50.23 kg/m2 51.08 kg/m2       Type 2 diabetes mellitus with obesity (HCC) Diabetes associated with hypertension, hyperlipidemia, obesity, arthritis, and  neurological disease Uncontrolled, managed by Endo, needs to be diligent with food choice, diet and testinmg blood sugar , has f/u with endo which she needs to keep  Tracey [name] is reminded of the importance of commitment to daily physical activity for 30 minutes or more, as able and the need to limit carbohydrate intake to 30 to 60 grams per meal to help with blood sugar control.   The need to take medication as prescribed, test blood sugar as directed, and to call between visits if there is a concern that blood sugar is uncontrolled is also discussed.   Tracey [name] is reminded of the importance of daily foot exam, annual eye examination, and good blood sugar, blood pressure and cholesterol control.     Latest Ref Rng & Units 09/07/2023    2:40 PM 07/19/2023    1:46 PM 03/17/2023   11:11 AM 03/04/2023    1:59 PM 11/11/2022   10:41 AM  Diabetic Labs  HbA1c 4.0 - 5.6 %  9.5  7.7   7.1   Micro/Creat Ratio 0 - 29 mg/g creat 58       Chol 100 - 199 mg/dL 536    644    HDL >03 mg/dL 49    38    Calc LDL 0 - 99 mg/dL 474    259    Triglycerides 0 - 149 mg/dL 563    875    Creatinine 0.57 - 1.00 mg/dL 6.43    3.29        08/29/8414    2:29 PM 09/07/2023    1:52 PM 08/16/2023    8:05 AM 07/19/2023    1:28 PM 03/17/2023   10:52 AM 03/04/2023    1:10 PM 02/01/2023   10:15 AM  BP/Weight  Systolic BP 138 153 124 118 118 118 --  Diastolic BP 80 81 70 68 83 70 --  Wt. (Lbs)  368.12 374 372.2 359.8 350.08 356  BMI  52.82 kg/m2 53.66 kg/m2 53.41 kg/m2 51.63 kg/m2 50.23 kg/m2 51.08 kg/m2      Latest Ref Rng & Units 04/28/2023   11:15 AM 12/31/2021    9:00 AM  Foot/eye exam completion dates  Eye Exam No Retinopathy No Retinopathy       Foot Form Completion   Done     This result is from an external source.

## 2023-09-13 NOTE — Telephone Encounter (Signed)
-----   Message from Theotis Flake sent at 09/13/2023  7:39 AM EDT ----- Monitor looks good. NSR with rare PAC, rare PVC, no changes

## 2023-09-13 NOTE — Telephone Encounter (Signed)
 Called and spoke to pt. Discussed results of heart monitor. Pt verbalized understanding of results - no questions asked at time of call.

## 2023-09-13 NOTE — Assessment & Plan Note (Signed)
  Patient re-educated about  the importance of commitment to a  minimum of 150 minutes of exercise per week as able.  The importance of healthy food choices with portion control discussed, as well as eating regularly and within a 12 hour window most days. The need to choose "clean , green" food 50 to 75% of the time is discussed, as well as to make water  the primary drink and set a goal of 64 ounces water  daily.       09/07/2023    1:52 PM 08/16/2023    8:05 AM 07/19/2023    1:28 PM  Weight /BMI  Weight 368 lb 1.9 oz 374 lb 372 lb 3.2 oz  Height 5\' 10"  (1.778 m) 5\' 10"  (1.778 m) 5\' 10"  (1.778 m)  BMI 52.82 kg/m2 53.66 kg/m2 53.41 kg/m2    Weight reduction occurring which is great

## 2023-09-13 NOTE — Assessment & Plan Note (Signed)
 Diabetes associated with hypertension, hyperlipidemia, obesity, arthritis, and neurological disease Uncontrolled, managed by Endo, needs to be diligent with food choice, diet and testinmg blood sugar , has f/u with endo which she needs to keep  Tracey [name] is reminded of the importance of commitment to daily physical activity for 30 minutes or more, as able and the need to limit carbohydrate intake to 30 to 60 grams per meal to help with blood sugar control.   The need to take medication as prescribed, test blood sugar as directed, and to call between visits if there is a concern that blood sugar is uncontrolled is also discussed.   Tracey [name] is reminded of the importance of daily foot exam, annual eye examination, and good blood sugar, blood pressure and cholesterol control.     Latest Ref Rng & Units 09/07/2023    2:40 PM 07/19/2023    1:46 PM 03/17/2023   11:11 AM 03/04/2023    1:59 PM 11/11/2022   10:41 AM  Diabetic Labs  HbA1c 4.0 - 5.6 %  9.5  7.7   7.1   Micro/Creat Ratio 0 - 29 mg/g creat 58       Chol 100 - 199 mg/dL 161    096    HDL >04 mg/dL 49    38    Calc LDL 0 - 99 mg/dL 540    981    Triglycerides 0 - 149 mg/dL 191    478    Creatinine 0.57 - 1.00 mg/dL 2.95    6.21        06/18/6576    2:29 PM 09/07/2023    1:52 PM 08/16/2023    8:05 AM 07/19/2023    1:28 PM 03/17/2023   10:52 AM 03/04/2023    1:10 PM 02/01/2023   10:15 AM  BP/Weight  Systolic BP 138 153 124 118 118 118 --  Diastolic BP 80 81 70 68 83 70 --  Wt. (Lbs)  368.12 374 372.2 359.8 350.08 356  BMI  52.82 kg/m2 53.66 kg/m2 53.41 kg/m2 51.63 kg/m2 50.23 kg/m2 51.08 kg/m2      Latest Ref Rng & Units 04/28/2023   11:15 AM 12/31/2021    9:00 AM  Foot/eye exam completion dates  Eye Exam No Retinopathy No Retinopathy       Foot Form Completion   Done     This result is from an external source.

## 2023-09-13 NOTE — Assessment & Plan Note (Signed)
 Increased  bilateral foot pain , start gabapentin 

## 2023-09-13 NOTE — Assessment & Plan Note (Signed)
 Controlled, no change in medication DASH diet and commitment to daily physical activity for a minimum of 30 minutes discussed and encouraged, as a part of hypertension management. The importance of attaining a healthy weight is also discussed.     09/07/2023    2:29 PM 09/07/2023    1:52 PM 08/16/2023    8:05 AM 07/19/2023    1:28 PM 03/17/2023   10:52 AM 03/04/2023    1:10 PM 02/01/2023   10:15 AM  BP/Weight  Systolic BP 138 153 124 118 118 118 --  Diastolic BP 80 81 70 68 83 70 --  Wt. (Lbs)  368.12 374 372.2 359.8 350.08 356  BMI  52.82 kg/m2 53.66 kg/m2 53.41 kg/m2 51.63 kg/m2 50.23 kg/m2 51.08 kg/m2

## 2023-09-13 NOTE — Assessment & Plan Note (Signed)
 Hyperlipidemia:Low fat diet discussed and encouraged.   Lipid Panel  Lab Results  Component Value Date   CHOL 181 09/07/2023   HDL 49 09/07/2023   LDLCALC 102 (H) 09/07/2023   TRIG 171 (H) 09/07/2023   CHOLHDL 3.7 09/07/2023     Needs to reduce dietary fat, no med change

## 2023-09-15 ENCOUNTER — Ambulatory Visit (HOSPITAL_COMMUNITY)

## 2023-09-28 ENCOUNTER — Other Ambulatory Visit: Payer: Self-pay | Admitting: Family Medicine

## 2023-09-28 NOTE — Telephone Encounter (Signed)
 Copied from CRM [id number]. Topic: Clinical - Medication Refill >> Sep 28, 2023  4:33 PM Pam Bode wrote: Medication:  olmesartan  (BENICAR ) 20 MG tablet   Has the patient contacted their pharmacy? Yes (Agent: If no, request that the patient contact the pharmacy for the refill. If patient does not wish to contact the pharmacy document the reason why and proceed with request.) (Agent: If yes, when and what did the pharmacy advise?)  This is the patient's preferred pharmacy:  [facility] - [place], [place] - [address] [place] [address] Phone: [phone] Fax: [phone]   Is this the correct pharmacy for this prescription? Yes If no, delete pharmacy and type the correct one.   Has the prescription been filled recently? Yes  Is the patient out of the medication? Yes  Has the patient been seen for an appointment in the last year OR does the patient have an upcoming appointment? Yes  Can we respond through MyChart? Yes  Agent: Please be advised that Rx refills may take up to 3 business days. We ask that you follow-up with your pharmacy.

## 2023-09-29 ENCOUNTER — Other Ambulatory Visit: Payer: Self-pay

## 2023-09-29 ENCOUNTER — Telehealth: Payer: Self-pay

## 2023-09-29 MED ORDER — OLMESARTAN MEDOXOMIL 20 MG PO TABS: 10.0000 mg | ORAL_TABLET | Freq: Every day | ORAL | 1 refills | Status: AC

## 2023-09-29 NOTE — Telephone Encounter (Signed)
 Copied from CRM [id number]. Topic: Clinical - Prescription Issue >> Sep 29, 2023  9:16 AM Rennis Case wrote: Reason for CRM: Tammy from [facility] pharmacy states they received 30 day supply for olmesartan  w/ additional refills. However, insurance is requesting 90 day supply to be sent in so that it will be covered.   Requesting rx be sent in for 90 day supply.

## 2023-09-29 NOTE — Telephone Encounter (Signed)
Sent for 90 days  

## 2023-10-08 ENCOUNTER — Ambulatory Visit: Admitting: Internal Medicine

## 2023-10-11 ENCOUNTER — Ambulatory Visit (INDEPENDENT_AMBULATORY_CARE_PROVIDER_SITE_OTHER): Admitting: Podiatry

## 2023-10-11 DIAGNOSIS — Z91199 Patient's noncompliance with other medical treatment and regimen due to unspecified reason: Secondary | ICD-10-CM

## 2023-10-11 NOTE — Progress Notes (Signed)
 Cancel 24 hours

## 2023-10-13 ENCOUNTER — Ambulatory Visit (HOSPITAL_COMMUNITY)

## 2023-11-03 ENCOUNTER — Ambulatory Visit (INDEPENDENT_AMBULATORY_CARE_PROVIDER_SITE_OTHER): Admitting: Podiatry

## 2023-11-03 ENCOUNTER — Encounter: Payer: Self-pay | Admitting: Podiatry

## 2023-11-03 DIAGNOSIS — B351 Tinea unguium: Secondary | ICD-10-CM

## 2023-11-03 DIAGNOSIS — E1142 Type 2 diabetes mellitus with diabetic polyneuropathy: Secondary | ICD-10-CM

## 2023-11-03 DIAGNOSIS — M79675 Pain in left toe(s): Secondary | ICD-10-CM | POA: Diagnosis not present

## 2023-11-03 DIAGNOSIS — M79674 Pain in right toe(s): Secondary | ICD-10-CM

## 2023-11-03 NOTE — Progress Notes (Signed)
  Subjective:  Patient ID: Tracey [name], female    DOB: 1955-12-23,   MRN: [id number]  Chief Complaint  Patient presents with   Diabetes    Here for toe nail cut Saw [facility] 08/18/23 A1c - 9.5    68 y.o. female presents for concern of thickened elongated and painful nails that are difficult to trim. Requesting to have them trimmed today. Relates burning and tingling in their feet. Patient is diabetic and last A1c was  Lab Results  Component Value Date   HGBA1C 9.5 (A) 07/19/2023   .   PCP:  [name], MD    . Denies any other pedal complaints. Denies n/v/f/c.   Past Medical History:  Diagnosis Date   Arthritis    Diabetes mellitus without complication (HCC)    Diastolic dysfunction 10/2009   Grade 1   GERD (gastroesophageal reflux disease)    Hyperlipidemia    Hyperlipidemia    Hypertension 04/15/2012   Morbid obesity (HCC)    PE (pulmonary embolism)    Pulmonary emboli (HCC) 1999   post TAH   Sleep apnea     Objective:  Physical Exam: Vascular: DP/PT pulses 2/4 bilateral. CFT <3 seconds. Absent hair growth on digits. Edema noted to bilateral lower extremities. Xerosis noted bilaterally.  Skin. No lacerations or abrasions bilateral feet. Nails 1-5 bilateral  are thickened discolored and elongated with subungual debris.  Musculoskeletal: MMT 5/5 bilateral lower extremities in DF, PF, Inversion and Eversion. Deceased ROM in DF of ankle joint.  Neurological: Sensation intact to light touch. Protective sensation diminished bilateral.     Assessment:   1. Pain due to onychomycosis of toenails of both feet   2. Diabetic peripheral neuropathy associated with type 2 diabetes mellitus (HCC)       Plan:  Patient was evaluated and treated and all questions answered. -Discussed and educated patient on diabetic foot care, especially with  regards to the vascular, neurological and musculoskeletal systems.  -Stressed the importance of good glycemic  control and the detriment of not  controlling glucose levels in relation to the foot. -Discussed supportive shoes at all times and checking feet regularly.  -Mechanically debrided all nails 1-5 bilateral using sterile nail nipper and filed with dremel without incident  -Answered all patient questions -Patient to return  in 3 months for at risk foot care -Patient advised to call the office if any problems or questions arise in the meantime.   Asberry Failing, DPM

## 2023-11-17 ENCOUNTER — Ambulatory Visit (HOSPITAL_COMMUNITY)

## 2023-11-22 ENCOUNTER — Ambulatory Visit: Admitting: Nurse Practitioner

## 2023-11-22 DIAGNOSIS — E782 Mixed hyperlipidemia: Secondary | ICD-10-CM

## 2023-11-22 DIAGNOSIS — I10 Essential (primary) hypertension: Secondary | ICD-10-CM

## 2023-11-22 DIAGNOSIS — Z794 Long term (current) use of insulin: Secondary | ICD-10-CM

## 2023-11-22 DIAGNOSIS — Z7984 Long term (current) use of oral hypoglycemic drugs: Secondary | ICD-10-CM

## 2023-11-22 DIAGNOSIS — E559 Vitamin D deficiency, unspecified: Secondary | ICD-10-CM

## 2023-11-22 DIAGNOSIS — Z7985 Long-term (current) use of injectable non-insulin antidiabetic drugs: Secondary | ICD-10-CM

## 2023-12-01 ENCOUNTER — Ambulatory Visit (INDEPENDENT_AMBULATORY_CARE_PROVIDER_SITE_OTHER): Admitting: Nurse Practitioner

## 2023-12-01 ENCOUNTER — Encounter (HOSPITAL_COMMUNITY): Payer: Self-pay

## 2023-12-01 ENCOUNTER — Encounter: Payer: Self-pay | Admitting: Nurse Practitioner

## 2023-12-01 ENCOUNTER — Ambulatory Visit (HOSPITAL_COMMUNITY)
Admission: RE | Admit: 2023-12-01 | Discharge: 2023-12-01 | Disposition: A | Discharge status: Home / Self Care | Source: Ambulatory Visit | Attending: Family Medicine | Admitting: Family Medicine

## 2023-12-01 VITALS — BP 138/82 | HR 104 | Ht 70.0 in | Wt 370.4 lb

## 2023-12-01 DIAGNOSIS — I10 Essential (primary) hypertension: Secondary | ICD-10-CM

## 2023-12-01 DIAGNOSIS — Z794 Long term (current) use of insulin: Secondary | ICD-10-CM | POA: Diagnosis not present

## 2023-12-01 DIAGNOSIS — Z1231 Encounter for screening mammogram for malignant neoplasm of breast: Secondary | ICD-10-CM | POA: Insufficient documentation

## 2023-12-01 DIAGNOSIS — E559 Vitamin D deficiency, unspecified: Secondary | ICD-10-CM | POA: Diagnosis not present

## 2023-12-01 DIAGNOSIS — E119 Type 2 diabetes mellitus without complications: Secondary | ICD-10-CM | POA: Diagnosis not present

## 2023-12-01 DIAGNOSIS — Z7985 Long-term (current) use of injectable non-insulin antidiabetic drugs: Secondary | ICD-10-CM | POA: Diagnosis not present

## 2023-12-01 DIAGNOSIS — E782 Mixed hyperlipidemia: Secondary | ICD-10-CM

## 2023-12-01 DIAGNOSIS — Z7984 Long term (current) use of oral hypoglycemic drugs: Secondary | ICD-10-CM

## 2023-12-01 LAB — POCT GLYCOSYLATED HEMOGLOBIN (HGB A1C): Hemoglobin A1C: 9 % — AB (ref 4.0–5.6)

## 2023-12-01 MED ORDER — TIRZEPATIDE 7.5 MG/0.5ML ~~LOC~~ SOAJ: 7.5000 mg | SUBCUTANEOUS | 1 refills | Status: AC

## 2023-12-01 NOTE — Progress Notes (Signed)
 12/01/2023, 8:29 AM   Endocrinology Follow Up Visit    Subjective:    Patient ID: Tracey [name], female    DOB: 11-May-1955.  Tracey [name] is being engaged in follow-up for management of currently uncontrolled symptomatic type 2 diabetes, hyperlipidemia, hypertension.    PMD:   [name], MD.  Past Medical History:  Diagnosis Date   Arthritis    Diabetes mellitus without complication (HCC)    Diastolic dysfunction 10/2009   Grade 1   GERD (gastroesophageal reflux disease)    Hyperlipidemia    Hyperlipidemia    Hypertension 04/15/2012   Morbid obesity (HCC)    PE (pulmonary embolism)    Pulmonary emboli (HCC) 1999   post TAH   Sleep apnea    Past Surgical History:  Procedure Laterality Date   ABDOMINAL HYSTERECTOMY  1999   , totalfor fibroids   CESAREAN SECTION     CHOLECYSTECTOMY  1996   COLONOSCOPY N/A 10/31/2015   Procedure: COLONOSCOPY;  Surgeon: Claudis RAYMOND Rivet, MD;  Location: [facility];  Service: Endoscopy;  Laterality: N/A;  930   FOOT SURGERY Right    October 2022   Social History   Socioeconomic History   Marital status: Single    Spouse name: Not on file   Number of children: Not on file   Years of education: Not on file   Highest education level: Not on file  Occupational History   Not on file  Tobacco Use   Smoking status: Never   Smokeless tobacco: Never  Vaping Use   Vaping status: Never Used  Substance and Sexual Activity   Alcohol use: No   Drug use: No   Sexual activity: Not Currently  Other Topics Concern   Not on file  Social History Narrative   Not on file   Social Drivers of Health   Financial Resource Strain: Low Risk  (02/01/2023)   Overall Financial Resource Strain (CARDIA)    Difficulty of Paying Living Expenses: Not very hard  Food Insecurity: Food Insecurity Present (08/10/2023)   Hunger  Vital Sign    Worried About Running Out of Food in the Last Year: Sometimes true    Ran Out of Food in the Last Year: Sometimes true  Transportation Needs: No Transportation Needs (08/23/2023)   PRAPARE - Administrator, Civil Service (Medical): No    Lack of Transportation (Non-Medical): No  Physical Activity: Inactive (07/29/2023)   Exercise Vital Sign    Days of Exercise per Week: 0 days    Minutes of Exercise per Session: 0 min  Stress: No Stress Concern Present (02/01/2023)   [employer] of Occupational Health - Occupational Stress Questionnaire    Feeling of Stress : Only a little  Social Connections: Moderately Isolated (02/01/2023)   Social Connection and Isolation Panel    Frequency of Communication with Friends and Family: More than three times a week  Frequency of Social Gatherings with Friends and Family: More than three times a week    Attends Religious Services: More than 4 times per year    Active Member of Clubs or Organizations: No    Attends Banker Meetings: Never    Marital Status: Divorced   Outpatient Encounter Medications as of 12/01/2023  Medication Sig   acetaminophen  (TYLENOL ) 500 MG tablet Take 500 mg by mouth every 6 (six) hours as needed for moderate pain.   aspirin  EC 81 MG tablet Take 1 tablet (81 mg total) by mouth daily.   B-D ULTRAFINE III SHORT PEN 31G X 8 MM MISC USE FOR INSULIN  INJECTIONS THREE TIMES DAILY.   blood glucose meter kit and supplies KIT Dispense based on patient and insurance preference. Use up to four times daily as directed.   gabapentin  (NEURONTIN ) 100 MG capsule Take 1 capsule (100 mg total) by mouth 3 (three) times daily.   glucose blood (ACCU-CHEK GUIDE TEST) test strip Use as instructed to monitor glucose 4 times daily- use as backup for CGM   insulin  regular human CONCENTRATED (HUMULIN  R U-500 KWIKPEN) 500 UNIT/ML KwikPen Inject 35 Units into the skin 3 (three) times daily with meals.   Lancets MISC  Use to test BG qid. Dx : E11.65. Softclix Lancets   metFORMIN  (GLUCOPHAGE ) 1000 MG tablet Take 1 tablet (1,000 mg total) by mouth 2 (two) times daily with a meal.   metoprolol  tartrate (LOPRESSOR ) 50 MG tablet TAKE ONE TABLET (50MG  TOTAL) BY MOUTH TWO TIMES DAILY   olmesartan  (BENICAR ) 20 MG tablet Take 0.5 tablets (10 mg total) by mouth daily.   omeprazole  (PRILOSEC) 20 MG capsule TAKE ONE CAPSULE BY MOUTH DAILY   rosuvastatin  (CRESTOR ) 20 MG tablet TAKE ONE TABLET (20MG  TOTAL) BY MOUTH DAILY   spironolactone  (ALDACTONE ) 25 MG tablet TAKE ONE (1) TABLET BY MOUTH EVERY DAY   tirzepatide  (MOUNJARO ) 7.5 MG/0.5ML Pen Inject 7.5 mg into the skin once a week.   [DISCONTINUED] Semaglutide , 2 MG/DOSE, 8 MG/3ML SOPN Inject 2 mg as directed once a week.   No facility-administered encounter medications on file as of 12/01/2023.    ALLERGIES: Allergies  Allergen Reactions   Haloperidol Lactate Anaphylaxis   Lipitor [Atorvastatin  Calcium ] Other (See Comments)    Markedly elevated liver enzymes    VACCINATION STATUS: Immunization History  Administered Date(s) Administered   Fluad Trivalent(High Dose 65+) 03/04/2023   Influenza,inj,Quad PF,6+ Mos 04/01/2015, 03/16/2016, 01/20/2017, 04/11/2018, 02/13/2019, 01/31/2020, 12/05/2020   Moderna Sars-Covid-2 Vaccination 12/13/2019, 01/11/2020   PNEUMOCOCCAL CONJUGATE-20 12/05/2020   Pneumococcal Polysaccharide-23 05/13/2015    Diabetes She presents for her follow-up diabetic visit. She has type 2 diabetes mellitus. Onset time: He was diagnosed at approximate age of 57 years. Her disease course has been improving. There are no hypoglycemic associated symptoms. Pertinent negatives for hypoglycemia include no confusion, headaches, pallor or seizures. Associated symptoms include fatigue. Pertinent negatives for diabetes include no blurred vision, no chest pain, no polydipsia, no polyphagia and no polyuria. There are no hypoglycemic complications. Symptoms are  stable. Diabetic complications include heart disease and nephropathy. Risk factors for coronary artery disease include diabetes mellitus, dyslipidemia, family history, hypertension, obesity, post-menopausal and sedentary lifestyle. Current diabetic treatment includes intensive insulin  program and oral agent (monotherapy) (and Ozempic ). She is compliant with treatment most of the time. Her weight is fluctuating minimally. She is following a generally healthy diet. When asked about meal planning, she reported none. She has had a previous visit with a dietitian.  She never participates in exercise. Her home blood glucose trend is decreasing steadily. Her overall blood glucose range is 140-180 mg/dl. (She presents today with her CGM and logs showing at target glycemic profile overall.  Her POCT A1c today is 9%, improving slightly from last visit of 9.5%.  Analysis of her CGM shows TIR 71%, TAR 29%, TBR 0%.  She denies any hypoglycemia.  She notes weight is going up despite her eating pretty healthy.) An ACE inhibitor/angiotensin II receptor blocker is not being taken. She does not see a podiatrist.Eye exam is not current.  Hyperlipidemia This is a chronic problem. The current episode started more than 1 year ago. The problem is uncontrolled. Recent lipid tests were reviewed and are high. Exacerbating diseases include chronic renal disease, diabetes and obesity. Factors aggravating her hyperlipidemia include fatty foods. Pertinent negatives include no chest pain, myalgias or shortness of breath. Current antihyperlipidemic treatment includes statins. The current treatment provides moderate improvement of lipids. Compliance problems include adherence to diet and adherence to exercise.  Risk factors for coronary artery disease include diabetes mellitus, dyslipidemia, hypertension, obesity, a sedentary lifestyle and post-menopausal.  Hypertension This is a chronic problem. The current episode started more than 1 year ago.  The problem is unchanged. The problem is uncontrolled. Pertinent negatives include no blurred vision, chest pain, headaches, palpitations or shortness of breath. There are no associated agents to hypertension. Risk factors for coronary artery disease include dyslipidemia, diabetes mellitus, obesity, sedentary lifestyle and post-menopausal state. Past treatments include beta blockers and diuretics. The current treatment provides moderate improvement. Compliance problems include exercise and diet.  Hypertensive end-organ damage includes kidney disease. Identifiable causes of hypertension include chronic renal disease.   Review of systems  Constitutional: + fluctuating body weight,  current Body mass index is 53.15 kg/m. , + intermittent fatigue, no subjective hyperthermia, no subjective hypothermia Eyes: no blurry vision, no xerophthalmia ENT: no sore throat, no nodules palpated in throat, no dysphagia/odynophagia, no hoarseness Cardiovascular: no chest pain, no shortness of breath, no palpitations, no leg swelling Respiratory: no cough, no shortness of breath Gastrointestinal: no nausea/vomiting/diarrhea Musculoskeletal: no muscle/joint aches Skin: no rashes, no hyperemia Neurological: no tremors, no numbness, no tingling, no dizziness Psychiatric: no depression, no anxiety    Objective:    BP 138/82 (BP Location: Left Arm, Patient Position: Sitting, Cuff Size: Large) Comment: Retake blood pressure , patient has not taken her BP medications this morning.  Pulse (!) 104   Ht 5' 10 (1.778 m)   Wt (!) 370 lb 6.4 oz (168 kg)   BMI 53.15 kg/m   Wt Readings from Last 3 Encounters:  12/01/23 (!) 370 lb 6.4 oz (168 kg)  09/07/23 (!) 368 lb 1.9 oz (167 kg)  08/16/23 (!) 374 lb (169.6 kg)     BP Readings from Last 3 Encounters:  12/01/23 138/82  09/07/23 138/80  08/16/23 124/70     Physical Exam- Limited  Constitutional:  Body mass index is 53.15 kg/m. , not in acute distress, normal  state of mind Eyes:  EOMI, no exophthalmos Musculoskeletal: no gross deformities, strength intact in all four extremities, no gross restriction of joint movements Skin:  no rashes, no hyperemia Neurological: no tremor with outstretched hands   Diabetic Foot Exam - Simple   No data filed     CMP     Component Value Date/Time   NA 138 09/07/2023 1440   K 4.4 09/07/2023 1440   CL 96 09/07/2023 1440   CO2 21  09/07/2023 1440   GLUCOSE 233 (H) 09/07/2023 1440   GLUCOSE 148 (H) 12/21/2019 1252   BUN 13 09/07/2023 1440   CREATININE 0.78 09/07/2023 1440   CREATININE 0.71 12/21/2019 1252   CALCIUM  9.8 09/07/2023 1440   PROT 7.7 09/07/2023 1440   ALBUMIN 4.5 09/07/2023 1440   AST 25 09/07/2023 1440   ALT 29 09/07/2023 1440   ALKPHOS 75 09/07/2023 1440   BILITOT 0.4 09/07/2023 1440   GFRNONAA 77 02/13/2019 1207   GFRAA 90 02/13/2019 1207    Diabetic Labs (most recent): Lab Results  Component Value Date   HGBA1C 9.0 (A) 12/01/2023   HGBA1C 9.5 (A) 07/19/2023   HGBA1C 7.7 (A) 03/17/2023   MICROALBUR 30 05/22/2021   MICROALBUR 7.9 12/21/2019   MICROALBUR 63.8 (H) 04/12/2018    Lipid Panel     Component Value Date/Time   CHOL 181 09/07/2023 1440   TRIG 171 (H) 09/07/2023 1440   HDL 49 09/07/2023 1440   CHOLHDL 3.7 09/07/2023 1440   CHOLHDL 3.5 02/01/2020 1415   VLDL 23 07/13/2016 1430   LDLCALC 102 (H) 09/07/2023 1440   LDLCALC 75 02/01/2020 1415      Lab Results  Component Value Date   TSH 0.889 09/07/2023   TSH 0.722 07/15/2022   TSH 0.955 06/24/2021   TSH 0.727 12/05/2020   TSH 0.69 02/13/2019   TSH 1.23 05/09/2018   TSH 0.95 07/13/2016   TSH 1.26 07/10/2015   TSH 1.382 11/17/2013   TSH 0.519 04/14/2012      Lipid Panel     Component Value Date/Time   CHOL 181 09/07/2023 1440   TRIG 171 (H) 09/07/2023 1440   HDL 49 09/07/2023 1440   CHOLHDL 3.7 09/07/2023 1440   CHOLHDL 3.5 02/01/2020 1415   VLDL 23 07/13/2016 1430   LDLCALC 102 (H) 09/07/2023  1440   LDLCALC 75 02/01/2020 1415     Assessment & Plan:   1) Type 2 Diabetes without complication, with long-term current use of insulin   - Karisa D Coto has currently uncontrolled symptomatic type 2 DM since 68 years of age.   She presents today with her CGM and logs showing at target glycemic profile overall.  Her POCT A1c today is 9%, improving slightly from last visit of 9.5%.  Analysis of her CGM shows TIR 71%, TAR 29%, TBR 0%.  She denies any hypoglycemia.  She notes weight is going up despite her eating pretty healthy.  -her diabetes is complicated by obesity/sedentary life and CEDRIC DENISON remains at a high risk for more acute and chronic complications which include CAD, CVA, CKD, retinopathy, and neuropathy. These are all discussed in detail with the patient.  - Nutritional counseling repeated at each appointment due to patients tendency to fall back in to old habits.  - The patient admits there is a room for improvement in their diet and drink choices. -  Suggestion is made for the patient to avoid simple carbohydrates from their diet including Cakes, Sweet Desserts / Pastries, Ice Cream, Soda (diet and regular), Sweet Tea, Candies, Chips, Cookies, Sweet Pastries, Store Bought Juices, Alcohol in Excess of 1-2 drinks a day, Artificial Sweeteners, Coffee Creamer, and Sugar-free Products. This will help patient to have stable blood glucose profile and potentially avoid unintended weight gain.   - I encouraged the patient to switch to unprocessed or minimally processed complex starch and increased protein intake (animal or plant source), fruits, and vegetables.   - Patient is advised to stick to  a routine mealtimes to eat 3 meals a day and avoid unnecessary snacks (to snack only to correct hypoglycemia).  - I have approached her with the following individualized plan to manage diabetes and patient agrees:   -She is advised to continue U500 35 units TID with meals if  glucose is above 90 and she is eating, and Metformin  1000 mg po twice daily with meals for now.  Will change her from Ozempic  to Mounjaro  7.5 mg SQ weekly.  She does not need to start on lowest dose as she has been on the highest dose of Ozempic .  -Encouraged patient to monitor blood glucose 4 times per day (using her CGM), before meals and at bedtime and notify the clinic if blood glucose levels are less than 70 or greater than 300 for 3 tests in a row.  She has benefited greatly from CGM device but was denied from upgrade to the [place] 3.  -She is also advised not to inject insulin  without proper glucose monitoring.  2) BP/HTN:  Her blood pressure is controlled to target.  She typically waits to take her Spironolactone  25 mg until she gets home so she won't have to urinate as often.  She is advised to continue Metoprolol  50 mg po twice daily, Olmesartan  20 mg po daily, and continue Spironolactone  25 mg po daily.    3) Lipids/HPL:  Her recent lipid panel from 09/07/23 shows uncontrolled LDL of 102.  She is advised to continue Crestor  20 mg po daily at bedtime.  Side effects and precautions discussed with her.    4) Vitamin D  Deficiency: Her recent vitamin D  level was 24.1 on 07/15/22.  She has completed replenishment with ergocalciferol  and is now taking OTC Vitamin D3 5000 units daily as maintenance dose.  She is advised to continue for now.    5) Chronic Care/Health Maintenance: -she is on Statin medications and is encouraged to continue to follow up with Ophthalmology, Dentist, Podiatrist at least yearly or according to recommendations, and advised to stay away from smoking. I have recommended yearly flu vaccine and pneumonia vaccination at least every 5 years; moderate intensity exercise for up to 150 minutes weekly; and  sleep for at least 7 hours a day.  - I advised patient to maintain close follow up with [name], MD for primary care needs.      I spent  30  minutes in the  care of the patient today including review of labs from CMP, Lipids, Thyroid  Function, Hematology (current and previous including abstractions from other facilities); face-to-face time discussing  her blood glucose readings/logs, discussing hypoglycemia and hyperglycemia episodes and symptoms, medications doses, her options of short and long term treatment based on the latest standards of care / guidelines;  discussion about incorporating lifestyle medicine;  and documenting the encounter. Risk reduction counseling performed per USPSTF guidelines to reduce obesity and cardiovascular risk factors.     Please refer to Patient Instructions for Blood Glucose Monitoring and Insulin /Medications Dosing Guide  in media tab for additional information. Please  also refer to  Patient Self Inventory in the Media  tab for reviewed elements of pertinent patient history.  Harvest JONETTA Mango participated in the discussions, expressed understanding, and voiced agreement with the above plans.  All questions were answered to her satisfaction. she is encouraged to contact clinic should she have any questions or concerns prior to her return visit.   Follow up plan: - Return in about 3 months (around 03/02/2024) for  Diabetes F/U with A1c in office, No previsit labs, Bring meter and logs.   Benton Rio, [facility] [address] [place], [place] [address] Phone: [phone] Fax: [phone]  12/01/2023, 8:29 AM

## 2023-12-03 ENCOUNTER — Other Ambulatory Visit (HOSPITAL_COMMUNITY): Payer: Self-pay | Admitting: Family Medicine

## 2023-12-03 DIAGNOSIS — R928 Other abnormal and inconclusive findings on diagnostic imaging of breast: Secondary | ICD-10-CM

## 2023-12-07 ENCOUNTER — Ambulatory Visit (HOSPITAL_COMMUNITY)

## 2023-12-07 ENCOUNTER — Ambulatory Visit: Admitting: Family Medicine

## 2023-12-07 ENCOUNTER — Inpatient Hospital Stay (HOSPITAL_COMMUNITY): Admission: RE | Admit: 2023-12-07 | Source: Ambulatory Visit

## 2023-12-15 ENCOUNTER — Ambulatory Visit (INDEPENDENT_AMBULATORY_CARE_PROVIDER_SITE_OTHER): Admitting: Family Medicine

## 2023-12-15 ENCOUNTER — Encounter: Payer: Self-pay | Admitting: Family Medicine

## 2023-12-15 VITALS — BP 131/75 | HR 120 | Resp 16 | Ht 70.0 in | Wt 365.1 lb

## 2023-12-15 DIAGNOSIS — I10 Essential (primary) hypertension: Secondary | ICD-10-CM | POA: Diagnosis not present

## 2023-12-15 DIAGNOSIS — E1169 Type 2 diabetes mellitus with other specified complication: Secondary | ICD-10-CM | POA: Diagnosis not present

## 2023-12-15 DIAGNOSIS — E782 Mixed hyperlipidemia: Secondary | ICD-10-CM

## 2023-12-15 MED ORDER — METOPROLOL TARTRATE 50 MG PO TABS: ORAL_TABLET | ORAL | 3 refills | Status: AC

## 2023-12-15 NOTE — Patient Instructions (Signed)
 F/U in 6 months  Pls schedule pt to come for flu vaccine in October  Lipid, cmp and EGFr today  Keep changing food choice so blood sugar improves and so does your health  Keep mammogram appt please  It is important that you exercise regularly at least 30 minutes 5 times a week. If you develop chest pain, have severe difficulty breathing, or feel very tired, stop exercising immediately and seek medical attention    Thanks for choosing [facility], we consider it a privelige to serve you.

## 2023-12-21 ENCOUNTER — Encounter (HOSPITAL_COMMUNITY)

## 2023-12-21 ENCOUNTER — Ambulatory Visit (HOSPITAL_COMMUNITY)

## 2023-12-21 ENCOUNTER — Encounter: Payer: Self-pay | Admitting: Family Medicine

## 2023-12-21 NOTE — Assessment & Plan Note (Signed)
 Diabetes associated with hypertension, hyperlipidemia, and obesity  Tracey [name] is reminded of the importance of commitment to daily physical activity for 30 minutes or more, as able and the need to limit carbohydrate intake to 30 to 60 grams per meal to help with blood sugar control.  Uncontrolled though slightly  improved managed by Endo  The need to take medication as prescribed, test blood sugar as directed, and to call between visits if there is a concern that blood sugar is uncontrolled is also discussed.   Tracey [name] is reminded of the importance of daily foot exam, annual eye examination, and good blood sugar, blood pressure and cholesterol control.     Latest Ref Rng & Units 12/01/2023    8:19 AM 09/07/2023    2:40 PM 07/19/2023    1:46 PM 03/17/2023   11:11 AM 03/04/2023    1:59 PM  Diabetic Labs  HbA1c 4.0 - 5.6 % 9.0   9.5  7.7    Micro/Creat Ratio 0 - 29 mg/g creat  58      Chol 100 - 199 mg/dL  818    834   HDL >60 mg/dL  49    38   Calc LDL 0 - 99 mg/dL  897    894   Triglycerides 0 - 149 mg/dL  828    876   Creatinine 0.57 - 1.00 mg/dL  9.21    9.12       0/09/7972    1:00 PM 12/01/2023    8:08 AM 12/01/2023    8:02 AM 09/07/2023    2:29 PM 09/07/2023    1:52 PM 08/16/2023    8:05 AM 07/19/2023    1:28 PM  BP/Weight  Systolic BP 131 138 140 138 153 124 118  Diastolic BP 75 82 80 80 81 70 68  Wt. (Lbs) 365.12  370.4  368.12 374 372.2  BMI 52.39 kg/m2  53.15 kg/m2  52.82 kg/m2 53.66 kg/m2 53.41 kg/m2      Latest Ref Rng & Units 04/28/2023   11:15 AM 12/31/2021    9:00 AM  Foot/eye exam completion dates  Eye Exam No Retinopathy No Retinopathy       Foot Form Completion   Done     This result is from an external source.

## 2023-12-21 NOTE — Assessment & Plan Note (Signed)
  Patient re-educated about  the importance of commitment to a  minimum of 150 minutes of exercise per week as able.  The importance of healthy food choices with portion control discussed, as well as eating regularly and within a 12 hour window most days. The need to choose clean , green food 50 to 75% of the time is discussed, as well as to make water  the primary drink and set a goal of 64 ounces water  daily.       12/15/2023    1:00 PM 12/01/2023    8:02 AM 09/07/2023    1:52 PM  Weight /BMI  Weight 365 lb 1.9 oz 370 lb 6.4 oz 368 lb 1.9 oz  Height 5' 10 (1.778 m) 5' 10 (1.778 m) 5' 10 (1.778 m)  BMI 52.39 kg/m2 53.15 kg/m2 52.82 kg/m2

## 2023-12-21 NOTE — Assessment & Plan Note (Signed)
 Hyperlipidemia:Low fat diet discussed and encouraged.   Lipid Panel  Lab Results  Component Value Date   CHOL 181 09/07/2023   HDL 49 09/07/2023   LDLCALC 102 (H) 09/07/2023   TRIG 171 (H) 09/07/2023   CHOLHDL 3.7 09/07/2023     Needs to reduce fat intake, no med change

## 2023-12-21 NOTE — Assessment & Plan Note (Signed)
 Controlled, no change in medication DASH diet and commitment to daily physical activity for a minimum of 30 minutes discussed and encouraged, as a part of hypertension management. The importance of attaining a healthy weight is also discussed.     12/15/2023    1:00 PM 12/01/2023    8:08 AM 12/01/2023    8:02 AM 09/07/2023    2:29 PM 09/07/2023    1:52 PM 08/16/2023    8:05 AM 07/19/2023    1:28 PM  BP/Weight  Systolic BP 131 138 140 138 153 124 118  Diastolic BP 75 82 80 80 81 70 68  Wt. (Lbs) 365.12  370.4  368.12 374 372.2  BMI 52.39 kg/m2  53.15 kg/m2  52.82 kg/m2 53.66 kg/m2 53.41 kg/m2

## 2023-12-21 NOTE — Progress Notes (Signed)
 Tracey [name]     MRN: [id number]      DOB: January 02, 1956  Chief Complaint  Patient presents with   Medical Management of Chronic Issues    Follow up     HPI Tracey [name] is here for follow up and re-evaluation of chronic medical conditions, medication management and review of any available recent lab and radiology data.  Preventive health is updated, specifically  Cancer screening and Immunization.   Questions or concerns regarding consultations or procedures which the PT has had in the interim are  addressed. The PT denies any adverse reactions to current medications since the last visit.  There are no new concerns.  There are no specific complaints   ROS Denies recent fever or chills. Denies sinus pressure, nasal congestion, ear pain or sore throat. Denies chest congestion, productive cough or wheezing. Denies chest pains, palpitations and leg swelling Denies abdominal pain, nausea, vomiting,diarrhea or constipation.   Denies dysuria, frequency, hesitancy or incontinence. Denies uncontrolled  joint pain, swelling and limitation in mobility. Denies headaches, seizures, numbness, or tingling. Denies depression, anxiety or insomnia. Denies skin break down or rash.   PE  BP 131/75   Pulse (!) 120   Resp 16   Ht 5' 10 (1.778 m)   Wt (!) 365 lb 1.9 oz (165.6 kg)   SpO2 95%   BMI 52.39 kg/m   Patient alert and oriented and in no cardiopulmonary distress.  HEENT: No facial asymmetry, EOMI,     Neck supple .  Chest: Clear to auscultation bilaterally.  CVS: S1, S2 no murmurs, no S3.Regular rate.  ABD: Soft non tender.   Ext: No edema  MS: decreased  ROM spine, shoulders, hips and knees.  Skin: Intact, no ulcerations or rash noted.  Psych: Good eye contact, normal affect. Memory intact not anxious or depressed appearing.  CNS: CN 2-12 intact, power,  normal throughout.no focal deficits noted.   Assessment & Plan  Type 2 diabetes mellitus with obesity  (HCC) Diabetes associated with hypertension, hyperlipidemia, and obesity  Tracey [name] is reminded of the importance of commitment to daily physical activity for 30 minutes or more, as able and the need to limit carbohydrate intake to 30 to 60 grams per meal to help with blood sugar control.  Uncontrolled though slightly  improved managed by Endo  The need to take medication as prescribed, test blood sugar as directed, and to call between visits if there is a concern that blood sugar is uncontrolled is also discussed.   Tracey [name] is reminded of the importance of daily foot exam, annual eye examination, and good blood sugar, blood pressure and cholesterol control.     Latest Ref Rng & Units 12/01/2023    8:19 AM 09/07/2023    2:40 PM 07/19/2023    1:46 PM 03/17/2023   11:11 AM 03/04/2023    1:59 PM  Diabetic Labs  HbA1c 4.0 - 5.6 % 9.0   9.5  7.7    Micro/Creat Ratio 0 - 29 mg/g creat  58      Chol 100 - 199 mg/dL  818    834   HDL >60 mg/dL  49    38   Calc LDL 0 - 99 mg/dL  897    894   Triglycerides 0 - 149 mg/dL  828    876   Creatinine 0.57 - 1.00 mg/dL  9.21    9.12       0/09/7972    1:00  PM 12/01/2023    8:08 AM 12/01/2023    8:02 AM 09/07/2023    2:29 PM 09/07/2023    1:52 PM 08/16/2023    8:05 AM 07/19/2023    1:28 PM  BP/Weight  Systolic BP 131 138 140 138 153 124 118  Diastolic BP 75 82 80 80 81 70 68  Wt. (Lbs) 365.12  370.4  368.12 374 372.2  BMI 52.39 kg/m2  53.15 kg/m2  52.82 kg/m2 53.66 kg/m2 53.41 kg/m2      Latest Ref Rng & Units 04/28/2023   11:15 AM 12/31/2021    9:00 AM  Foot/eye exam completion dates  Eye Exam No Retinopathy No Retinopathy       Foot Form Completion   Done     This result is from an external source.        Morbid obesity  Patient re-educated about  the importance of commitment to a  minimum of 150 minutes of exercise per week as able.  The importance of healthy food choices with portion control discussed, as well as eating regularly  and within a 12 hour window most days. The need to choose clean , green food 50 to 75% of the time is discussed, as well as to make water  the primary drink and set a goal of 64 ounces water  daily.       12/15/2023    1:00 PM 12/01/2023    8:02 AM 09/07/2023    1:52 PM  Weight /BMI  Weight 365 lb 1.9 oz 370 lb 6.4 oz 368 lb 1.9 oz  Height 5' 10 (1.778 m) 5' 10 (1.778 m) 5' 10 (1.778 m)  BMI 52.39 kg/m2 53.15 kg/m2 52.82 kg/m2      Essential hypertension, benign Controlled, no change in medication DASH diet and commitment to daily physical activity for a minimum of 30 minutes discussed and encouraged, as a part of hypertension management. The importance of attaining a healthy weight is also discussed.     12/15/2023    1:00 PM 12/01/2023    8:08 AM 12/01/2023    8:02 AM 09/07/2023    2:29 PM 09/07/2023    1:52 PM 08/16/2023    8:05 AM 07/19/2023    1:28 PM  BP/Weight  Systolic BP 131 138 140 138 153 124 118  Diastolic BP 75 82 80 80 81 70 68  Wt. (Lbs) 365.12  370.4  368.12 374 372.2  BMI 52.39 kg/m2  53.15 kg/m2  52.82 kg/m2 53.66 kg/m2 53.41 kg/m2       Mixed hyperlipidemia Hyperlipidemia:Low fat diet discussed and encouraged.   Lipid Panel  Lab Results  Component Value Date   CHOL 181 09/07/2023   HDL 49 09/07/2023   LDLCALC 102 (H) 09/07/2023   TRIG 171 (H) 09/07/2023   CHOLHDL 3.7 09/07/2023     Needs to reduce fat intake, no med change

## 2024-01-04 ENCOUNTER — Encounter (HOSPITAL_COMMUNITY): Payer: Self-pay

## 2024-01-04 ENCOUNTER — Ambulatory Visit (HOSPITAL_COMMUNITY)
Admission: RE | Admit: 2024-01-04 | Discharge: 2024-01-04 | Disposition: A | Discharge status: Home / Self Care | Source: Ambulatory Visit | Attending: Family Medicine | Admitting: Family Medicine

## 2024-01-04 DIAGNOSIS — R928 Other abnormal and inconclusive findings on diagnostic imaging of breast: Secondary | ICD-10-CM | POA: Insufficient documentation

## 2024-01-04 DIAGNOSIS — R92322 Mammographic fibroglandular density, left breast: Secondary | ICD-10-CM | POA: Diagnosis not present

## 2024-01-17 ENCOUNTER — Ambulatory Visit

## 2024-01-21 ENCOUNTER — Ambulatory Visit

## 2024-01-28 ENCOUNTER — Ambulatory Visit (INDEPENDENT_AMBULATORY_CARE_PROVIDER_SITE_OTHER)

## 2024-01-28 DIAGNOSIS — I10 Essential (primary) hypertension: Secondary | ICD-10-CM | POA: Diagnosis not present

## 2024-01-28 DIAGNOSIS — Z23 Encounter for immunization: Secondary | ICD-10-CM

## 2024-01-28 DIAGNOSIS — E782 Mixed hyperlipidemia: Secondary | ICD-10-CM | POA: Diagnosis not present

## 2024-01-29 LAB — CMP14+EGFR
ALT: 30 IU/L (ref 0–32)
AST: 37 IU/L (ref 0–40)
Albumin: 4.1 g/dL (ref 3.9–4.9)
Alkaline Phosphatase: 71 IU/L (ref 49–135)
BUN/Creatinine Ratio: 9 — ABNORMAL LOW (ref 12–28)
BUN: 7 mg/dL — ABNORMAL LOW (ref 8–27)
Bilirubin Total: 0.3 mg/dL (ref 0.0–1.2)
CO2: 21 mmol/L (ref 20–29)
Calcium: 9.2 mg/dL (ref 8.7–10.3)
Chloride: 99 mmol/L (ref 96–106)
Creatinine, Ser: 0.75 mg/dL (ref 0.57–1.00)
Globulin, Total: 3.2 g/dL (ref 1.5–4.5)
Glucose: 190 mg/dL — ABNORMAL HIGH (ref 70–99)
Potassium: 4.3 mmol/L (ref 3.5–5.2)
Sodium: 138 mmol/L (ref 134–144)
Total Protein: 7.3 g/dL (ref 6.0–8.5)
eGFR: 88 mL/min/1.73 (ref >59)

## 2024-01-29 LAB — LIPID PANEL
Chol/HDL Ratio: 4.2 ratio (ref 0.0–4.4)
Cholesterol, Total: 175 mg/dL (ref 100–199)
HDL: 42 mg/dL (ref >39)
LDL Chol Calc (NIH): 108 mg/dL — ABNORMAL HIGH (ref 0–99)
Triglycerides: 142 mg/dL (ref 0–149)
VLDL Cholesterol Cal: 25 mg/dL (ref 5–40)

## 2024-02-07 ENCOUNTER — Ambulatory Visit: Admitting: Podiatry

## 2024-02-09 ENCOUNTER — Ambulatory Visit: Payer: Self-pay

## 2024-02-11 ENCOUNTER — Ambulatory Visit: Payer: Self-pay | Admitting: Family Medicine

## 2024-02-14 ENCOUNTER — Telehealth: Payer: Self-pay

## 2024-02-14 ENCOUNTER — Ambulatory Visit: Payer: Self-pay

## 2024-02-14 NOTE — Telephone Encounter (Signed)
 FYI Only or Action Required?: FYI only for provider: lab results given.  Patient was last seen in primary care on 12/15/2023 by [name], MD.  Called Nurse Triage reporting Results.    Triage Disposition: Information or Advice Only Call  Patient/caregiver understands and will follow disposition?: Yes  Copied from CRM [id number]. Topic: Clinical - Lab/Test Results >> Feb 14, 2024  3:12 PM Tracey [name] wrote: Reason for CRM: Patient is calling back to receive lab results. Patient did not understand Bad cholesterol slightly high Please advise Reason for Disposition  Health information question, no triage required and triager able to answer question  Answer Assessment - Initial Assessment Questions 1. REASON FOR CALL: What is the main reason for your call? or How can I best help you?     Question about lab values.  Results read per Dr. Antonetta and patient verbalizes understanding:   Needs to reduce fried and fatty foods, Bad cholesterol slightly high Normal kidney and liver function  Protocols used: Information Only Call - No Triage-A-AH

## 2024-02-14 NOTE — Telephone Encounter (Signed)
 Appt scheduled

## 2024-02-14 NOTE — Telephone Encounter (Signed)
 Copied from CRM #[id number]. Topic: Appointments - Scheduling Inquiry for Clinic >> Feb 14, 2024  3:13 PM Delon HERO wrote: Reason for CRM: Patient is calling to reschedule her AWV. Please advise

## 2024-02-21 ENCOUNTER — Ambulatory Visit (INDEPENDENT_AMBULATORY_CARE_PROVIDER_SITE_OTHER): Admitting: Podiatry

## 2024-02-21 DIAGNOSIS — Z91199 Patient's noncompliance with other medical treatment and regimen due to unspecified reason: Secondary | ICD-10-CM

## 2024-02-21 NOTE — Progress Notes (Signed)
 [place] 24 hours

## 2024-02-22 ENCOUNTER — Ambulatory Visit: Payer: Self-pay

## 2024-03-02 ENCOUNTER — Ambulatory Visit: Admitting: Nurse Practitioner

## 2024-03-02 DIAGNOSIS — E119 Type 2 diabetes mellitus without complications: Secondary | ICD-10-CM

## 2024-03-02 DIAGNOSIS — Z794 Long term (current) use of insulin: Secondary | ICD-10-CM

## 2024-03-02 DIAGNOSIS — I10 Essential (primary) hypertension: Secondary | ICD-10-CM

## 2024-03-02 DIAGNOSIS — Z7984 Long term (current) use of oral hypoglycemic drugs: Secondary | ICD-10-CM

## 2024-03-02 DIAGNOSIS — E559 Vitamin D deficiency, unspecified: Secondary | ICD-10-CM

## 2024-03-02 DIAGNOSIS — Z7985 Long-term (current) use of injectable non-insulin antidiabetic drugs: Secondary | ICD-10-CM

## 2024-03-02 DIAGNOSIS — E782 Mixed hyperlipidemia: Secondary | ICD-10-CM

## 2024-03-06 ENCOUNTER — Ambulatory Visit (INDEPENDENT_AMBULATORY_CARE_PROVIDER_SITE_OTHER): Admitting: Nurse Practitioner

## 2024-03-06 ENCOUNTER — Encounter: Payer: Self-pay | Admitting: Podiatry

## 2024-03-06 ENCOUNTER — Encounter: Payer: Self-pay | Admitting: Nurse Practitioner

## 2024-03-06 ENCOUNTER — Ambulatory Visit: Admitting: Podiatry

## 2024-03-06 VITALS — BP 118/72 | HR 113 | Ht 70.0 in | Wt 370.4 lb

## 2024-03-06 DIAGNOSIS — E782 Mixed hyperlipidemia: Secondary | ICD-10-CM

## 2024-03-06 DIAGNOSIS — M79674 Pain in right toe(s): Secondary | ICD-10-CM | POA: Diagnosis not present

## 2024-03-06 DIAGNOSIS — Z7985 Long-term (current) use of injectable non-insulin antidiabetic drugs: Secondary | ICD-10-CM

## 2024-03-06 DIAGNOSIS — E1142 Type 2 diabetes mellitus with diabetic polyneuropathy: Secondary | ICD-10-CM | POA: Diagnosis not present

## 2024-03-06 DIAGNOSIS — Z7984 Long term (current) use of oral hypoglycemic drugs: Secondary | ICD-10-CM | POA: Diagnosis not present

## 2024-03-06 DIAGNOSIS — M79675 Pain in left toe(s): Secondary | ICD-10-CM | POA: Diagnosis not present

## 2024-03-06 DIAGNOSIS — E559 Vitamin D deficiency, unspecified: Secondary | ICD-10-CM | POA: Diagnosis not present

## 2024-03-06 DIAGNOSIS — E119 Type 2 diabetes mellitus without complications: Secondary | ICD-10-CM

## 2024-03-06 DIAGNOSIS — E1165 Type 2 diabetes mellitus with hyperglycemia: Secondary | ICD-10-CM | POA: Diagnosis not present

## 2024-03-06 DIAGNOSIS — Z794 Long term (current) use of insulin: Secondary | ICD-10-CM | POA: Diagnosis not present

## 2024-03-06 DIAGNOSIS — I10 Essential (primary) hypertension: Secondary | ICD-10-CM | POA: Diagnosis not present

## 2024-03-06 DIAGNOSIS — B351 Tinea unguium: Secondary | ICD-10-CM | POA: Diagnosis not present

## 2024-03-06 LAB — POCT GLYCOSYLATED HEMOGLOBIN (HGB A1C): Hemoglobin A1C: 9 % — AB (ref 4.0–5.6)

## 2024-03-06 NOTE — Progress Notes (Signed)
  Subjective:  Patient ID: Tracey [name], female    DOB: 10/12/1955,   MRN: [id number]  Chief Complaint  Patient presents with   Diabetes    Trim my toenails.  Saw Benton Rio 08/18/23, A1c - 9.5     68 y.o. female presents for concern of thickened elongated and painful nails that are difficult to trim. Requesting to have them trimmed today. Relates burning and tingling in their feet. Patient is diabetic and last A1c was  Lab Results  Component Value Date   HGBA1C 9.0 (A) 12/01/2023   .   PCP:  Tracey Rollene BRAVO, MD    . Denies any other pedal complaints. Denies n/v/f/c.   Past Medical History:  Diagnosis Date   Arthritis    Diabetes mellitus without complication (HCC)    Diastolic dysfunction 10/2009   Grade 1   GERD (gastroesophageal reflux disease)    Hyperlipidemia    Hyperlipidemia    Hypertension 04/15/2012   Morbid obesity (HCC)    PE (pulmonary embolism)    Pulmonary emboli (HCC) 1999   post TAH   Sleep apnea     Objective:  Physical Exam: Vascular: DP/PT pulses 2/4 bilateral. CFT <3 seconds. Absent hair growth on digits. Edema noted to bilateral lower extremities. Xerosis noted bilaterally.  Skin. No lacerations or abrasions bilateral feet. Nails 1-5 bilateral  are thickened discolored and elongated with subungual debris.  Musculoskeletal: MMT 5/5 bilateral lower extremities in DF, PF, Inversion and Eversion. Deceased ROM in DF of ankle joint.  Neurological: Sensation intact to light touch. Protective sensation diminished bilateral.     Assessment:   1. Pain due to onychomycosis of toenails of both feet   2. Diabetic peripheral neuropathy associated with type 2 diabetes mellitus (HCC)        Plan:  Patient was evaluated and treated and all questions answered. -Discussed and educated patient on diabetic foot care, especially with  regards to the vascular, neurological and musculoskeletal systems.  -Stressed the importance of good glycemic  control and the detriment of not  controlling glucose levels in relation to the foot. -Discussed supportive shoes at all times and checking feet regularly.  -Mechanically debrided all nails 1-5 bilateral using sterile nail nipper and filed with dremel without incident  -Answered all patient questions -Patient to return  in 3 months for at risk foot care -Patient advised to call the office if any problems or questions arise in the meantime.   Asberry Failing, DPM

## 2024-03-06 NOTE — Progress Notes (Signed)
 03/06/2024, 3:20 PM   Endocrinology Follow Up Visit    Subjective:    Patient ID: Tracey [name], female    DOB: 16-May-1955.  Tracey [name] is being engaged in follow-up for management of currently uncontrolled symptomatic type 2 diabetes, hyperlipidemia, hypertension.    PMD:   [name], MD.  Past Medical History:  Diagnosis Date   Arthritis    Diabetes mellitus without complication (HCC)    Diastolic dysfunction 10/2009   Grade 1   GERD (gastroesophageal reflux disease)    Hyperlipidemia    Hyperlipidemia    Hypertension 04/15/2012   Morbid obesity (HCC)    PE (pulmonary embolism)    Pulmonary emboli (HCC) 1999   post TAH   Sleep apnea    Past Surgical History:  Procedure Laterality Date   ABDOMINAL HYSTERECTOMY  1999   , totalfor fibroids   BREAST EXCISIONAL BIOPSY Left    benign   CESAREAN SECTION     CHOLECYSTECTOMY  1996   COLONOSCOPY N/A 10/31/2015   Procedure: COLONOSCOPY;  Surgeon: Claudis RAYMOND Rivet, MD;  Location: [facility];  Service: Endoscopy;  Laterality: N/A;  930   FOOT SURGERY Right    October 2022   Social History   Socioeconomic History   Marital status: Single    Spouse name: Not on file   Number of children: Not on file   Years of education: Not on file   Highest education level: Not on file  Occupational History   Not on file  Tobacco Use   Smoking status: Never   Smokeless tobacco: Never  Vaping Use   Vaping status: Never Used  Substance and Sexual Activity   Alcohol use: No   Drug use: No   Sexual activity: Not Currently  Other Topics Concern   Not on file  Social History Narrative   Not on file   Social Drivers of Health   Financial Resource Strain: Low Risk  (02/01/2023)   Overall Financial Resource Strain (CARDIA)    Difficulty of Paying Living Expenses: Not very hard  Food Insecurity:  Food Insecurity Present (08/10/2023)   Hunger Vital Sign    Worried About Running Out of Food in the Last Year: Sometimes true    Ran Out of Food in the Last Year: Sometimes true  Transportation Needs: No Transportation Needs (08/23/2023)   PRAPARE - Administrator, Civil Service (Medical): No    Lack of Transportation (Non-Medical): No  Physical Activity: Inactive (07/29/2023)   Exercise Vital Sign    Days of Exercise per Week: 0 days    Minutes of Exercise per Session: 0 min  Stress: No Stress Concern Present (02/01/2023)   [employer] of Occupational Health - Occupational Stress Questionnaire    Feeling of Stress : Only a little  Social Connections: Moderately Isolated (02/01/2023)   Social Connection and Isolation Panel    Frequency of Communication with Friends and  Family: More than three times a week    Frequency of Social Gatherings with Friends and Family: More than three times a week    Attends Religious Services: More than 4 times per year    Active Member of [employer] or Organizations: No    Attends Banker Meetings: Never    Marital Status: Divorced   Outpatient Encounter Medications as of 03/06/2024  Medication Sig   acetaminophen  (TYLENOL ) 500 MG tablet Take 500 mg by mouth every 6 (six) hours as needed for moderate pain.   aspirin  EC 81 MG tablet Take 1 tablet (81 mg total) by mouth daily.   B-D ULTRAFINE III SHORT PEN 31G X 8 MM MISC USE FOR INSULIN  INJECTIONS THREE TIMES DAILY.   blood glucose meter kit and supplies KIT Dispense based on patient and insurance preference. Use up to four times daily as directed.   gabapentin  (NEURONTIN ) 100 MG capsule Take 1 capsule (100 mg total) by mouth 3 (three) times daily.   glucose blood (ACCU-CHEK GUIDE TEST) test strip Use as instructed to monitor glucose 4 times daily- use as backup for CGM   insulin  regular human CONCENTRATED (HUMULIN  R U-500 KWIKPEN) 500 UNIT/ML KwikPen Inject 35 Units into the skin 3  (three) times daily with meals.   Lancets MISC Use to test BG qid. Dx : E11.65. Softclix Lancets   metFORMIN  (GLUCOPHAGE ) 1000 MG tablet Take 1 tablet (1,000 mg total) by mouth 2 (two) times daily with a meal.   metoprolol  tartrate (LOPRESSOR ) 50 MG tablet TAKE ONE TABLET (50MG  TOTAL) BY MOUTH TWO TIMES DAILY   olmesartan  (BENICAR ) 20 MG tablet Take 0.5 tablets (10 mg total) by mouth daily.   omeprazole  (PRILOSEC) 20 MG capsule TAKE ONE CAPSULE BY MOUTH DAILY   rosuvastatin  (CRESTOR ) 20 MG tablet TAKE ONE TABLET (20MG  TOTAL) BY MOUTH DAILY   spironolactone  (ALDACTONE ) 25 MG tablet TAKE ONE (1) TABLET BY MOUTH EVERY DAY   tirzepatide  (MOUNJARO ) 7.5 MG/0.5ML Pen Inject 7.5 mg into the skin once a week.   No facility-administered encounter medications on file as of 03/06/2024.    ALLERGIES: Allergies  Allergen Reactions   Haloperidol Lactate Anaphylaxis   Lipitor [Atorvastatin  Calcium ] Other (See Comments)    Markedly elevated liver enzymes    VACCINATION STATUS: Immunization History  Administered Date(s) Administered   Fluad Trivalent(High Dose 65+) 03/04/2023   INFLUENZA, HIGH DOSE SEASONAL PF 01/28/2024   Influenza,inj,Quad PF,6+ Mos 04/01/2015, 03/16/2016, 01/20/2017, 04/11/2018, 02/13/2019, 01/31/2020, 12/05/2020   Moderna Sars-Covid-2 Vaccination 12/13/2019, 01/11/2020   PNEUMOCOCCAL CONJUGATE-20 12/05/2020   Pneumococcal Polysaccharide-23 05/13/2015    Diabetes She presents for her follow-up diabetic visit. She has type 2 diabetes mellitus. Onset time: He was diagnosed at approximate age of 50 years. Her disease course has been improving. There are no hypoglycemic associated symptoms. Pertinent negatives for hypoglycemia include no confusion, pallor or seizures. Associated symptoms include fatigue. Pertinent negatives for diabetes include no polydipsia, no polyphagia and no polyuria. There are no hypoglycemic complications. Symptoms are stable. Diabetic complications include  heart disease and nephropathy. Risk factors for coronary artery disease include diabetes mellitus, dyslipidemia, family history, hypertension, obesity, post-menopausal and sedentary lifestyle. Current diabetic treatment includes intensive insulin  program and oral agent (monotherapy) (and Ozempic ). She is compliant with treatment most of the time. Her weight is fluctuating minimally. She is following a generally healthy diet. Meal planning includes carbohydrate counting. She has had a previous visit with a dietitian. She never participates in exercise. Her home blood glucose  trend is decreasing steadily. Her overall blood glucose range is 140-180 mg/dl. (She presents today with her CGM and logs showing at target glycemic profile overall.  Her POCT A1c today is 9%, unchanged from previous visit.  Analysis of her CGM shows TIR 61%, TAR 39%, TBR 0%.  She denies any hypoglycemia.  She notes weight is fluctuating ?fluid related?  She denies any hypoglycemia.) An ACE inhibitor/angiotensin II receptor blocker is not being taken. She does not see a podiatrist.Eye exam is not current.   Review of systems  Constitutional: + fluctuating body weight,  current Body mass index is 53.15 kg/m. , + intermittent fatigue, no subjective hyperthermia, no subjective hypothermia Eyes: no blurry vision, no xerophthalmia ENT: no sore throat, no nodules palpated in throat, no dysphagia/odynophagia, no hoarseness Cardiovascular: no chest pain, no shortness of breath, no palpitations, no leg swelling Respiratory: no cough, no shortness of breath Gastrointestinal: no nausea/vomiting/diarrhea Musculoskeletal: no muscle/joint aches Skin: no rashes, no hyperemia Neurological: no tremors, no numbness, no tingling, no dizziness Psychiatric: no depression, no anxiety    Objective:    BP 118/72 (BP Location: Left Arm, Patient Position: Sitting, Cuff Size: Large)   Pulse (!) 113   Ht 5' 10 (1.778 m)   Wt (!) 370 lb 6.4 oz (168  kg)   BMI 53.15 kg/m   Wt Readings from Last 3 Encounters:  03/06/24 (!) 370 lb 6.4 oz (168 kg)  12/15/23 (!) 365 lb 1.9 oz (165.6 kg)  12/01/23 (!) 370 lb 6.4 oz (168 kg)     BP Readings from Last 3 Encounters:  03/06/24 118/72  12/15/23 131/75  12/01/23 138/82     Physical Exam- Limited  Constitutional:  Body mass index is 53.15 kg/m. , not in acute distress, normal state of mind Eyes:  EOMI, no exophthalmos Musculoskeletal: no gross deformities, strength intact in all four extremities, no gross restriction of joint movements Skin:  no rashes, no hyperemia Neurological: no tremor with outstretched hands   Diabetic Foot Exam - Simple   No data filed     CMP     Component Value Date/Time   NA 138 01/28/2024 0844   K 4.3 01/28/2024 0844   CL 99 01/28/2024 0844   CO2 21 01/28/2024 0844   GLUCOSE 190 (H) 01/28/2024 0844   GLUCOSE 148 (H) 12/21/2019 1252   BUN 7 (L) 01/28/2024 0844   CREATININE 0.75 01/28/2024 0844   CREATININE 0.71 12/21/2019 1252   CALCIUM  9.2 01/28/2024 0844   PROT 7.3 01/28/2024 0844   ALBUMIN 4.1 01/28/2024 0844   AST 37 01/28/2024 0844   ALT 30 01/28/2024 0844   ALKPHOS 71 01/28/2024 0844   BILITOT 0.3 01/28/2024 0844   GFRNONAA 77 02/13/2019 1207   GFRAA 90 02/13/2019 1207    Diabetic Labs (most recent): Lab Results  Component Value Date   HGBA1C 9.0 (A) 03/06/2024   HGBA1C 9.0 (A) 12/01/2023   HGBA1C 9.5 (A) 07/19/2023   MICROALBUR 30 05/22/2021   MICROALBUR 7.9 12/21/2019   MICROALBUR 63.8 (H) 04/12/2018    Lipid Panel     Component Value Date/Time   CHOL 175 01/28/2024 0844   TRIG 142 01/28/2024 0844   HDL 42 01/28/2024 0844   CHOLHDL 4.2 01/28/2024 0844   CHOLHDL 3.5 02/01/2020 1415   VLDL 23 07/13/2016 1430   LDLCALC 108 (H) 01/28/2024 0844   LDLCALC 75 02/01/2020 1415      Lab Results  Component Value Date   TSH 0.889 09/07/2023  TSH 0.722 07/15/2022   TSH 0.955 06/24/2021   TSH 0.727 12/05/2020   TSH 0.69  02/13/2019   TSH 1.23 05/09/2018   TSH 0.95 07/13/2016   TSH 1.26 07/10/2015   TSH 1.382 11/17/2013   TSH 0.519 04/14/2012      Lipid Panel     Component Value Date/Time   CHOL 175 01/28/2024 0844   TRIG 142 01/28/2024 0844   HDL 42 01/28/2024 0844   CHOLHDL 4.2 01/28/2024 0844   CHOLHDL 3.5 02/01/2020 1415   VLDL 23 07/13/2016 1430   LDLCALC 108 (H) 01/28/2024 0844   LDLCALC 75 02/01/2020 1415     Assessment & Plan:   1) Type 2 Diabetes without complication, with long-term current use of insulin   - Tracey [name] has currently uncontrolled symptomatic type 2 DM since 68 years of age.  She presents today with her CGM and logs showing at target glycemic profile overall.  Her POCT A1c today is 9%, unchanged from previous visit.  Analysis of her CGM shows TIR 61%, TAR 39%, TBR 0%.  She denies any hypoglycemia.  She notes weight is fluctuating ?fluid related?  She denies any hypoglycemia.  -her diabetes is complicated by obesity/sedentary life and Tracey [name] remains at a high risk for more acute and chronic complications which include CAD, CVA, CKD, retinopathy, and neuropathy. These are all discussed in detail with the patient.  - Nutritional counseling repeated/built upon at each appointment.  - The patient admits there is a room for improvement in their diet and drink choices. -  Suggestion is made for the patient to avoid simple carbohydrates from their diet including Cakes, Sweet Desserts / Pastries, Ice Cream, Soda (diet and regular), Sweet Tea, Candies, Chips, Cookies, Sweet Pastries, Store Bought Juices, Alcohol in Excess of 1-2 drinks a day, Artificial Sweeteners, Coffee Creamer, and Sugar-free Products. This will help patient to have stable blood glucose profile and potentially avoid unintended weight gain.   - I encouraged the patient to switch to unprocessed or minimally processed complex starch and increased protein intake (animal or plant source), fruits,  and vegetables.   - Patient is advised to stick to a routine mealtimes to eat 3 meals a day and avoid unnecessary snacks (to snack only to correct hypoglycemia).  - I have approached her with the following individualized plan to manage diabetes and patient agrees:   -She is advised to continue U500 35 units TID with meals if glucose is above 90 and she is eating, Metformin  1000 mg po twice daily with meals, and Mounjaro  7.5 mg SQ weekly.  She did have initial GI upset after the switch, gradually getting better.  Will hold off on advancing this dose just yet.  -Encouraged patient to monitor blood glucose 4 times per day (using her CGM), before meals and at bedtime and notify the clinic if blood glucose levels are less than 70 or greater than 300 for 3 tests in a row.  She has benefited greatly from CGM device but was denied from upgrade to the [place] 3.  -She is also advised not to inject insulin  without proper glucose monitoring.  2) BP/HTN:  Her blood pressure is controlled to target.  She is advised to continue her medications as prescribed by PCP.  3) Lipids/HPL:  Her recent lipid panel from 09/07/23 shows uncontrolled LDL of 102.  She is advised to continue Crestor  20 mg po daily at bedtime.  Side effects and precautions discussed with her.  4) Vitamin D  Deficiency: Her recent vitamin D  level was 24.1 on 07/15/22.  She has completed replenishment with ergocalciferol  and is now taking OTC Vitamin D3 5000 units daily as maintenance dose.  She is advised to continue for now.    5) Chronic Care/Health Maintenance: -she is on Statin medications and is encouraged to continue to follow up with Ophthalmology, Dentist, Podiatrist at least yearly or according to recommendations, and advised to stay away from smoking. I have recommended yearly flu vaccine and pneumonia vaccination at least every 5 years; moderate intensity exercise for up to 150 minutes weekly; and  sleep for at least 7 hours a  day.  - I advised patient to maintain close follow up with [name], MD for primary care needs.     I spent  31  minutes in the care of the patient today including review of labs from CMP, Lipids, Thyroid  Function, Hematology (current and previous including abstractions from other facilities); face-to-face time discussing  her blood glucose readings/logs, discussing hypoglycemia and hyperglycemia episodes and symptoms, medications doses, her options of short and long term treatment based on the latest standards of care / guidelines;  discussion about incorporating lifestyle medicine;  and documenting the encounter. Risk reduction counseling performed per USPSTF guidelines to reduce obesity and cardiovascular risk factors.     Please refer to Patient Instructions for Blood Glucose Monitoring and Insulin /Medications Dosing Guide  in media tab for additional information. Please  also refer to  Patient Self Inventory in the Media  tab for reviewed elements of pertinent patient history.  Harvest Tracey [name] participated in the discussions, expressed understanding, and voiced agreement with the above plans.  All questions were answered to her satisfaction. she is encouraged to contact clinic should she have any questions or concerns prior to her return visit.   Follow up plan: - Return in about 4 months (around 07/04/2024) for Diabetes F/U with A1c in office, No previsit labs, Bring meter and logs.   Benton Rio, [facility] [address] [place], [place] [address] Phone: [phone] Fax: [phone]  03/06/2024, 3:20 PM

## 2024-05-09 ENCOUNTER — Telehealth: Payer: Self-pay | Admitting: Family Medicine

## 2024-05-09 NOTE — Telephone Encounter (Unsigned)
 Copied from CRM [id number]. Topic: General - Other >> May 09, 2024 11:39 AM Tracey [name] wrote: Reason for CRM: Tracey [name] w/ [facility] called in to follow up on referral to receive service [facility] due to not being able to get in contact with patient. Asked if patient is in contact with clinic before [facility] to let her know that she is almost on the denial list and would have to start application over.

## 2024-05-09 NOTE — Telephone Encounter (Signed)
No call back number given.

## 2024-06-05 ENCOUNTER — Ambulatory Visit: Admitting: Podiatry

## 2024-06-14 ENCOUNTER — Ambulatory Visit: Payer: Self-pay | Admitting: Family Medicine

## 2024-07-06 ENCOUNTER — Ambulatory Visit: Admitting: Nurse Practitioner
